# Patient Record
Sex: Male | Born: 1950 | Race: White | Hispanic: No | Marital: Married | Smoking: Former smoker
Health system: Southern US, Community
[De-identification: ages and names within clinical notes are randomized; demographics above are authoritative.]

## PROBLEM LIST (undated history)

## (undated) DIAGNOSIS — I340 Nonrheumatic mitral (valve) insufficiency: Secondary | ICD-10-CM

## (undated) DIAGNOSIS — I714 Abdominal aortic aneurysm, without rupture, unspecified: Secondary | ICD-10-CM

## (undated) DIAGNOSIS — L02215 Cutaneous abscess of perineum: Secondary | ICD-10-CM

## (undated) DIAGNOSIS — I10 Essential (primary) hypertension: Secondary | ICD-10-CM

## (undated) DIAGNOSIS — R972 Elevated prostate specific antigen [PSA]: Secondary | ICD-10-CM

## (undated) DIAGNOSIS — I8390 Asymptomatic varicose veins of unspecified lower extremity: Secondary | ICD-10-CM

## (undated) DIAGNOSIS — I639 Cerebral infarction, unspecified: Secondary | ICD-10-CM

## (undated) DIAGNOSIS — Z860101 Personal history of adenomatous and serrated colon polyps: Secondary | ICD-10-CM

## (undated) DIAGNOSIS — F419 Anxiety disorder, unspecified: Secondary | ICD-10-CM

## (undated) DIAGNOSIS — F411 Generalized anxiety disorder: Secondary | ICD-10-CM

## (undated) DIAGNOSIS — F32A Depression, unspecified: Secondary | ICD-10-CM

## (undated) DIAGNOSIS — Z86010 Personal history of colonic polyps: Secondary | ICD-10-CM

## (undated) DIAGNOSIS — I724 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: Secondary | ICD-10-CM

## (undated) DIAGNOSIS — E119 Type 2 diabetes mellitus without complications: Secondary | ICD-10-CM

## (undated) DIAGNOSIS — Z8679 Personal history of other diseases of the circulatory system: Secondary | ICD-10-CM

## (undated) DIAGNOSIS — K5790 Diverticulosis of intestine, part unspecified, without perforation or abscess without bleeding: Secondary | ICD-10-CM

## (undated) DIAGNOSIS — E785 Hyperlipidemia, unspecified: Secondary | ICD-10-CM

## (undated) DIAGNOSIS — I739 Peripheral vascular disease, unspecified: Secondary | ICD-10-CM

## (undated) DIAGNOSIS — F329 Major depressive disorder, single episode, unspecified: Secondary | ICD-10-CM

## (undated) DIAGNOSIS — R760 Raised antibody titer: Secondary | ICD-10-CM

## (undated) DIAGNOSIS — K648 Other hemorrhoids: Secondary | ICD-10-CM

## (undated) DIAGNOSIS — K219 Gastro-esophageal reflux disease without esophagitis: Secondary | ICD-10-CM

## (undated) HISTORY — DX: Cerebral infarction, unspecified: I63.9

## (undated) HISTORY — DX: Major depressive disorder, single episode, unspecified: F32.9

## (undated) HISTORY — DX: Essential (primary) hypertension: I10

## (undated) HISTORY — PX: OTHER SURGICAL HISTORY: SHX169

## (undated) HISTORY — DX: Anxiety disorder, unspecified: F41.9

## (undated) HISTORY — DX: Other hemorrhoids: K64.8

## (undated) HISTORY — DX: Depression, unspecified: F32.A

## (undated) HISTORY — DX: Type 2 diabetes mellitus without complications: E11.9

## (undated) HISTORY — DX: Peripheral vascular disease, unspecified: I73.9

## (undated) HISTORY — DX: Personal history of colonic polyps: Z86.010

## (undated) HISTORY — DX: Asymptomatic varicose veins of unspecified lower extremity: I83.90

## (undated) HISTORY — DX: Abdominal aortic aneurysm, without rupture: I71.4

## (undated) HISTORY — DX: Personal history of other diseases of the circulatory system: Z86.79

## (undated) HISTORY — DX: Generalized anxiety disorder: F41.1

## (undated) HISTORY — DX: Nonrheumatic mitral (valve) insufficiency: I34.0

## (undated) HISTORY — PX: ABDOMINAL AORTIC ANEURYSM REPAIR: SUR1152

## (undated) HISTORY — DX: Gastro-esophageal reflux disease without esophagitis: K21.9

## (undated) HISTORY — DX: Cutaneous abscess of perineum: L02.215

## (undated) HISTORY — DX: Hyperlipidemia, unspecified: E78.5

## (undated) HISTORY — DX: Diverticulosis of intestine, part unspecified, without perforation or abscess without bleeding: K57.90

## (undated) HISTORY — DX: Elevated prostate specific antigen (PSA): R97.20

## (undated) HISTORY — DX: Abdominal aortic aneurysm, without rupture, unspecified: I71.40

## (undated) HISTORY — DX: Raised antibody titer: R76.0

## (undated) HISTORY — PX: SP ENDO REPAIR INFRAREN AAA: HXRAD399

## (undated) HISTORY — DX: Personal history of adenomatous and serrated colon polyps: Z86.0101

## (undated) HISTORY — PX: POLYPECTOMY: SHX149

## (undated) HISTORY — PX: COLONOSCOPY: SHX174

## (undated) HISTORY — DX: 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: I72.4

---

## 2001-05-06 DIAGNOSIS — I639 Cerebral infarction, unspecified: Secondary | ICD-10-CM

## 2001-05-06 HISTORY — DX: Cerebral infarction, unspecified: I63.9

## 2002-01-08 ENCOUNTER — Inpatient Hospital Stay (HOSPITAL_COMMUNITY): Admission: EM | Admit: 2002-01-08 | Discharge: 2002-01-12 | Payer: Self-pay | Admitting: *Deleted

## 2002-01-08 ENCOUNTER — Encounter: Payer: Self-pay | Admitting: *Deleted

## 2002-01-09 ENCOUNTER — Encounter: Payer: Self-pay | Admitting: Internal Medicine

## 2002-01-11 ENCOUNTER — Encounter (INDEPENDENT_AMBULATORY_CARE_PROVIDER_SITE_OTHER): Payer: Self-pay | Admitting: Cardiology

## 2002-01-15 ENCOUNTER — Encounter: Admission: RE | Admit: 2002-01-15 | Discharge: 2002-02-24 | Payer: Self-pay | Admitting: Internal Medicine

## 2002-03-03 ENCOUNTER — Ambulatory Visit (HOSPITAL_COMMUNITY): Admission: RE | Admit: 2002-03-03 | Discharge: 2002-03-03 | Payer: Self-pay | Admitting: Pediatrics

## 2002-03-03 ENCOUNTER — Encounter: Payer: Self-pay | Admitting: Pediatrics

## 2004-03-16 ENCOUNTER — Ambulatory Visit: Payer: Self-pay | Admitting: Cardiology

## 2004-04-13 ENCOUNTER — Ambulatory Visit: Payer: Self-pay | Admitting: Internal Medicine

## 2004-05-11 ENCOUNTER — Ambulatory Visit: Payer: Self-pay | Admitting: Cardiology

## 2004-06-08 ENCOUNTER — Ambulatory Visit: Payer: Self-pay | Admitting: Cardiology

## 2004-07-06 ENCOUNTER — Ambulatory Visit: Payer: Self-pay | Admitting: Cardiology

## 2004-08-03 ENCOUNTER — Ambulatory Visit: Payer: Self-pay | Admitting: Cardiology

## 2004-08-31 ENCOUNTER — Ambulatory Visit: Payer: Self-pay | Admitting: Cardiology

## 2004-09-28 ENCOUNTER — Ambulatory Visit: Payer: Self-pay | Admitting: Cardiology

## 2004-10-23 ENCOUNTER — Ambulatory Visit: Payer: Self-pay | Admitting: Internal Medicine

## 2004-10-26 ENCOUNTER — Ambulatory Visit: Payer: Self-pay | Admitting: Internal Medicine

## 2004-10-26 ENCOUNTER — Ambulatory Visit: Payer: Self-pay | Admitting: Cardiology

## 2004-11-23 ENCOUNTER — Ambulatory Visit: Payer: Self-pay | Admitting: Cardiovascular Disease

## 2004-12-21 ENCOUNTER — Ambulatory Visit: Payer: Self-pay | Admitting: Internal Medicine

## 2005-01-25 ENCOUNTER — Ambulatory Visit: Payer: Self-pay | Admitting: Cardiology

## 2005-02-22 ENCOUNTER — Ambulatory Visit: Payer: Self-pay | Admitting: Cardiology

## 2005-03-04 ENCOUNTER — Ambulatory Visit: Payer: Self-pay | Admitting: Internal Medicine

## 2005-03-22 ENCOUNTER — Ambulatory Visit: Payer: Self-pay | Admitting: Cardiology

## 2005-03-26 ENCOUNTER — Ambulatory Visit: Payer: Self-pay | Admitting: Gastroenterology

## 2005-04-02 ENCOUNTER — Ambulatory Visit: Payer: Self-pay | Admitting: Gastroenterology

## 2005-04-05 DIAGNOSIS — D126 Benign neoplasm of colon, unspecified: Secondary | ICD-10-CM

## 2005-04-05 HISTORY — DX: Benign neoplasm of colon, unspecified: D12.6

## 2005-04-12 ENCOUNTER — Ambulatory Visit: Payer: Self-pay | Admitting: Cardiology

## 2005-04-22 ENCOUNTER — Ambulatory Visit: Payer: Self-pay | Admitting: Internal Medicine

## 2005-04-24 ENCOUNTER — Encounter (INDEPENDENT_AMBULATORY_CARE_PROVIDER_SITE_OTHER): Payer: Self-pay | Admitting: Specialist

## 2005-04-24 ENCOUNTER — Ambulatory Visit: Payer: Self-pay | Admitting: Gastroenterology

## 2005-04-24 LAB — HM COLONOSCOPY

## 2005-04-30 ENCOUNTER — Ambulatory Visit: Payer: Self-pay | Admitting: Internal Medicine

## 2005-05-10 ENCOUNTER — Ambulatory Visit: Payer: Self-pay | Admitting: Cardiology

## 2005-05-24 ENCOUNTER — Ambulatory Visit: Payer: Self-pay | Admitting: Cardiology

## 2005-06-21 ENCOUNTER — Ambulatory Visit: Payer: Self-pay | Admitting: Internal Medicine

## 2005-07-12 ENCOUNTER — Ambulatory Visit: Payer: Self-pay | Admitting: Cardiology

## 2005-07-16 ENCOUNTER — Ambulatory Visit: Payer: Self-pay | Admitting: Gastroenterology

## 2005-08-01 ENCOUNTER — Ambulatory Visit: Payer: Self-pay | Admitting: Internal Medicine

## 2005-08-30 ENCOUNTER — Ambulatory Visit: Payer: Self-pay

## 2005-09-27 ENCOUNTER — Ambulatory Visit: Payer: Self-pay | Admitting: Cardiology

## 2005-10-30 ENCOUNTER — Ambulatory Visit: Payer: Self-pay | Admitting: Internal Medicine

## 2005-11-27 ENCOUNTER — Ambulatory Visit: Payer: Self-pay | Admitting: Cardiology

## 2005-12-24 ENCOUNTER — Ambulatory Visit: Payer: Self-pay | Admitting: Internal Medicine

## 2005-12-25 ENCOUNTER — Ambulatory Visit: Payer: Self-pay | Admitting: Cardiology

## 2005-12-26 ENCOUNTER — Ambulatory Visit: Payer: Self-pay | Admitting: Internal Medicine

## 2006-01-22 ENCOUNTER — Ambulatory Visit: Payer: Self-pay | Admitting: Cardiology

## 2006-02-19 ENCOUNTER — Ambulatory Visit: Payer: Self-pay | Admitting: Cardiology

## 2006-03-19 ENCOUNTER — Ambulatory Visit: Payer: Self-pay | Admitting: Cardiology

## 2006-04-18 ENCOUNTER — Ambulatory Visit: Payer: Self-pay | Admitting: Internal Medicine

## 2006-05-16 ENCOUNTER — Ambulatory Visit: Payer: Self-pay | Admitting: Internal Medicine

## 2006-06-06 ENCOUNTER — Ambulatory Visit: Payer: Self-pay | Admitting: Cardiology

## 2006-07-04 ENCOUNTER — Ambulatory Visit: Payer: Self-pay | Admitting: Cardiology

## 2006-08-01 ENCOUNTER — Ambulatory Visit: Payer: Self-pay | Admitting: Cardiology

## 2006-08-29 ENCOUNTER — Ambulatory Visit: Payer: Self-pay | Admitting: Cardiology

## 2006-10-03 ENCOUNTER — Ambulatory Visit: Payer: Self-pay | Admitting: Internal Medicine

## 2006-11-05 ENCOUNTER — Ambulatory Visit: Payer: Self-pay | Admitting: Cardiology

## 2006-12-05 ENCOUNTER — Ambulatory Visit: Payer: Self-pay | Admitting: Cardiology

## 2007-01-02 ENCOUNTER — Ambulatory Visit: Payer: Self-pay | Admitting: Cardiovascular Disease

## 2007-01-09 ENCOUNTER — Ambulatory Visit: Payer: Self-pay | Admitting: Internal Medicine

## 2007-01-09 LAB — CONVERTED CEMR LAB
ALT: 42 units/L (ref 0–53)
AST: 45 units/L — ABNORMAL HIGH (ref 0–37)
Albumin: 4.2 g/dL (ref 3.5–5.2)
Alkaline Phosphatase: 28 units/L — ABNORMAL LOW (ref 39–117)
BUN: 15 mg/dL (ref 6–23)
Bacteria, UA: NEGATIVE
Basophils Absolute: 0 10*3/uL (ref 0.0–0.1)
Basophils Relative: 1.3 % — ABNORMAL HIGH (ref 0.0–1.0)
Bilirubin Urine: NEGATIVE
Bilirubin, Direct: 0.2 mg/dL (ref 0.0–0.3)
CO2: 24 meq/L (ref 19–32)
Calcium: 9.9 mg/dL (ref 8.4–10.5)
Chloride: 103 meq/L (ref 96–112)
Cholesterol: 170 mg/dL (ref 0–200)
Creatinine, Ser: 0.9 mg/dL (ref 0.4–1.5)
Crystals: NEGATIVE
Direct LDL: 70.9 mg/dL
Eosinophils Absolute: 0.2 10*3/uL (ref 0.0–0.6)
Eosinophils Relative: 5.7 % — ABNORMAL HIGH (ref 0.0–5.0)
GFR calc Af Amer: 111 mL/min
GFR calc non Af Amer: 92 mL/min
Glucose, Bld: 151 mg/dL — ABNORMAL HIGH (ref 70–99)
HCT: 44.3 % (ref 39.0–52.0)
HDL: 32.3 mg/dL — ABNORMAL LOW (ref >39.0)
Hemoglobin, Urine: NEGATIVE
Hemoglobin: 15.8 g/dL (ref 13.0–17.0)
Ketones, ur: NEGATIVE mg/dL
Lymphocytes Relative: 28.3 % (ref 12.0–46.0)
MCHC: 35.7 g/dL (ref 30.0–36.0)
MCV: 94.9 fL (ref 78.0–100.0)
Monocytes Absolute: 0.4 10*3/uL (ref 0.2–0.7)
Monocytes Relative: 11.8 % — ABNORMAL HIGH (ref 3.0–11.0)
Mucus, UA: NEGATIVE
Neutro Abs: 2.1 10*3/uL (ref 1.4–7.7)
Neutrophils Relative %: 52.9 % (ref 43.0–77.0)
Nitrite: NEGATIVE
PSA: 1.82 ng/mL (ref 0.10–4.00)
Platelets: 215 10*3/uL (ref 150–400)
Potassium: 4.2 meq/L (ref 3.5–5.1)
RBC: 4.67 M/uL (ref 4.22–5.81)
RDW: 11.5 % (ref 11.5–14.6)
Sodium: 136 meq/L (ref 135–145)
Specific Gravity, Urine: 1.01 (ref 1.000–1.03)
TSH: 0.95 microintl units/mL (ref 0.35–5.50)
Total Bilirubin: 1.1 mg/dL (ref 0.3–1.2)
Total CHOL/HDL Ratio: 5.3
Total Protein, Urine: NEGATIVE mg/dL
Total Protein: 7.5 g/dL (ref 6.0–8.3)
Triglycerides: 333 mg/dL (ref 0–149)
Urine Glucose: NEGATIVE mg/dL
Urobilinogen, UA: 0.2 (ref 0.0–1.0)
VLDL: 67 mg/dL — ABNORMAL HIGH (ref 0–40)
WBC: 3.8 10*3/uL — ABNORMAL LOW (ref 4.5–10.5)
pH: 7 (ref 5.0–8.0)

## 2007-01-15 ENCOUNTER — Ambulatory Visit: Payer: Self-pay | Admitting: Internal Medicine

## 2007-01-15 LAB — CONVERTED CEMR LAB
Folate: >20 ng/mL
Hgb A1c MFr Bld: 6 % (ref 4.6–6.0)
Homocysteine: 17.7 micromoles/L — ABNORMAL HIGH (ref 5.00–13.90)
Vitamin B-12: 350 pg/mL (ref 211–911)

## 2007-02-06 ENCOUNTER — Ambulatory Visit: Payer: Self-pay | Admitting: Cardiology

## 2007-03-06 ENCOUNTER — Ambulatory Visit: Payer: Self-pay | Admitting: Cardiology

## 2007-04-06 ENCOUNTER — Ambulatory Visit: Payer: Self-pay | Admitting: Cardiology

## 2007-04-24 ENCOUNTER — Ambulatory Visit: Payer: Self-pay | Admitting: Internal Medicine

## 2007-05-29 ENCOUNTER — Ambulatory Visit: Payer: Self-pay | Admitting: Cardiology

## 2007-06-26 ENCOUNTER — Ambulatory Visit: Payer: Self-pay | Admitting: Cardiology

## 2007-07-24 ENCOUNTER — Ambulatory Visit: Payer: Self-pay | Admitting: Cardiology

## 2007-08-21 ENCOUNTER — Ambulatory Visit: Payer: Self-pay | Admitting: Cardiology

## 2007-09-25 ENCOUNTER — Ambulatory Visit: Payer: Self-pay | Admitting: Internal Medicine

## 2007-10-23 ENCOUNTER — Ambulatory Visit: Payer: Self-pay | Admitting: Cardiology

## 2007-11-20 ENCOUNTER — Ambulatory Visit: Payer: Self-pay | Admitting: Cardiovascular Disease

## 2007-11-25 ENCOUNTER — Telehealth: Payer: Self-pay | Admitting: Gastroenterology

## 2007-12-10 ENCOUNTER — Ambulatory Visit: Payer: Self-pay | Admitting: Gastroenterology

## 2007-12-10 DIAGNOSIS — Z860100 Personal history of colon polyps, unspecified: Secondary | ICD-10-CM

## 2007-12-10 DIAGNOSIS — R195 Other fecal abnormalities: Secondary | ICD-10-CM | POA: Insufficient documentation

## 2007-12-10 DIAGNOSIS — K625 Hemorrhage of anus and rectum: Secondary | ICD-10-CM | POA: Insufficient documentation

## 2007-12-10 HISTORY — DX: Personal history of colon polyps, unspecified: Z86.0100

## 2007-12-10 HISTORY — DX: Personal history of colonic polyps: Z86.010

## 2007-12-25 ENCOUNTER — Ambulatory Visit: Payer: Self-pay | Admitting: Internal Medicine

## 2008-01-13 ENCOUNTER — Ambulatory Visit: Payer: Self-pay | Admitting: Internal Medicine

## 2008-01-13 LAB — CONVERTED CEMR LAB
ALT: 40 units/L (ref 0–53)
AST: 40 units/L — ABNORMAL HIGH (ref 0–37)
Albumin: 4 g/dL (ref 3.5–5.2)
Alkaline Phosphatase: 25 units/L — ABNORMAL LOW (ref 39–117)
BUN: 17 mg/dL (ref 6–23)
Basophils Absolute: 0.1 10*3/uL (ref 0.0–0.1)
Basophils Relative: 1.9 % (ref 0.0–3.0)
Bilirubin Urine: NEGATIVE
Bilirubin, Direct: 0.1 mg/dL (ref 0.0–0.3)
CO2: 30 meq/L (ref 19–32)
Calcium: 9.5 mg/dL (ref 8.4–10.5)
Chloride: 109 meq/L (ref 96–112)
Cholesterol: 149 mg/dL (ref 0–200)
Creatinine, Ser: 1 mg/dL (ref 0.4–1.5)
Direct LDL: 64.3 mg/dL
Eosinophils Absolute: 0.3 10*3/uL (ref 0.0–0.7)
Eosinophils Relative: 7 % — ABNORMAL HIGH (ref 0.0–5.0)
GFR calc Af Amer: 98 mL/min
GFR calc non Af Amer: 81 mL/min
Glucose, Bld: 185 mg/dL — ABNORMAL HIGH (ref 70–99)
HCT: 43.2 % (ref 39.0–52.0)
HDL: 33.3 mg/dL — ABNORMAL LOW (ref >39.0)
Hemoglobin, Urine: NEGATIVE
Hemoglobin: 15.5 g/dL (ref 13.0–17.0)
Hgb A1c MFr Bld: 7.1 % — ABNORMAL HIGH (ref 4.6–6.0)
Ketones, ur: NEGATIVE mg/dL
Leukocytes, UA: NEGATIVE
Lymphocytes Relative: 33.2 % (ref 12.0–46.0)
MCHC: 36 g/dL (ref 30.0–36.0)
MCV: 94.7 fL (ref 78.0–100.0)
Monocytes Absolute: 0.4 10*3/uL (ref 0.1–1.0)
Monocytes Relative: 11.6 % (ref 3.0–12.0)
Neutro Abs: 1.6 10*3/uL (ref 1.4–7.7)
Neutrophils Relative %: 46.3 % (ref 43.0–77.0)
Nitrite: NEGATIVE
PSA: 1.45 ng/mL (ref 0.10–4.00)
Platelets: 211 10*3/uL (ref 150–400)
Potassium: 4.9 meq/L (ref 3.5–5.1)
RBC: 4.55 M/uL (ref 4.22–5.81)
RDW: 11.6 % (ref 11.5–14.6)
Sodium: 139 meq/L (ref 135–145)
Specific Gravity, Urine: 1.015 (ref 1.000–1.03)
TSH: 1.34 microintl units/mL (ref 0.35–5.50)
Total Bilirubin: 0.9 mg/dL (ref 0.3–1.2)
Total CHOL/HDL Ratio: 4.5
Total Protein, Urine: NEGATIVE mg/dL
Total Protein: 7.3 g/dL (ref 6.0–8.3)
Triglycerides: 425 mg/dL (ref 0–149)
Urine Glucose: NEGATIVE mg/dL
Urobilinogen, UA: 0.2 (ref 0.0–1.0)
VLDL: 85 mg/dL — ABNORMAL HIGH (ref 0–40)
WBC: 3.6 10*3/uL — ABNORMAL LOW (ref 4.5–10.5)
pH: 7 (ref 5.0–8.0)

## 2008-01-18 ENCOUNTER — Ambulatory Visit: Payer: Self-pay | Admitting: Gastroenterology

## 2008-01-18 ENCOUNTER — Ambulatory Visit: Payer: Self-pay | Admitting: Internal Medicine

## 2008-01-18 DIAGNOSIS — I83893 Varicose veins of bilateral lower extremities with other complications: Secondary | ICD-10-CM | POA: Insufficient documentation

## 2008-01-18 DIAGNOSIS — E119 Type 2 diabetes mellitus without complications: Secondary | ICD-10-CM

## 2008-01-18 DIAGNOSIS — K648 Other hemorrhoids: Secondary | ICD-10-CM | POA: Insufficient documentation

## 2008-01-18 DIAGNOSIS — K644 Residual hemorrhoidal skin tags: Secondary | ICD-10-CM | POA: Insufficient documentation

## 2008-01-18 DIAGNOSIS — I739 Peripheral vascular disease, unspecified: Secondary | ICD-10-CM

## 2008-01-18 DIAGNOSIS — N1832 Chronic kidney disease, stage 3b: Secondary | ICD-10-CM | POA: Insufficient documentation

## 2008-01-18 HISTORY — DX: Peripheral vascular disease, unspecified: I73.9

## 2008-01-18 HISTORY — DX: Type 2 diabetes mellitus without complications: E11.9

## 2008-01-22 ENCOUNTER — Ambulatory Visit: Payer: Self-pay | Admitting: Internal Medicine

## 2008-01-26 ENCOUNTER — Ambulatory Visit: Payer: Self-pay

## 2008-01-26 ENCOUNTER — Encounter: Payer: Self-pay | Admitting: Internal Medicine

## 2008-02-19 ENCOUNTER — Ambulatory Visit: Payer: Self-pay | Admitting: Cardiology

## 2008-03-18 ENCOUNTER — Ambulatory Visit: Payer: Self-pay | Admitting: Cardiovascular Disease

## 2008-04-15 ENCOUNTER — Ambulatory Visit: Payer: Self-pay | Admitting: Cardiology

## 2008-05-02 ENCOUNTER — Telehealth: Payer: Self-pay | Admitting: Internal Medicine

## 2008-05-13 ENCOUNTER — Ambulatory Visit: Payer: Self-pay | Admitting: Internal Medicine

## 2008-06-17 ENCOUNTER — Ambulatory Visit: Payer: Self-pay | Admitting: Cardiology

## 2008-06-27 ENCOUNTER — Encounter: Payer: Self-pay | Admitting: Internal Medicine

## 2008-07-14 ENCOUNTER — Ambulatory Visit: Payer: Self-pay | Admitting: Internal Medicine

## 2008-07-14 LAB — CONVERTED CEMR LAB
BUN: 14 mg/dL (ref 6–23)
CO2: 30 meq/L (ref 19–32)
Calcium: 9.7 mg/dL (ref 8.4–10.5)
Chloride: 103 meq/L (ref 96–112)
Cholesterol: 149 mg/dL (ref 0–200)
Creatinine, Ser: 0.9 mg/dL (ref 0.4–1.5)
Direct LDL: 49.7 mg/dL
GFR calc Af Amer: 111 mL/min
GFR calc non Af Amer: 92 mL/min
Glucose, Bld: 214 mg/dL — ABNORMAL HIGH (ref 70–99)
HDL: 31.6 mg/dL — ABNORMAL LOW (ref >39.0)
Hgb A1c MFr Bld: 7.4 % — ABNORMAL HIGH (ref 4.6–6.0)
Potassium: 4.4 meq/L (ref 3.5–5.1)
Sodium: 140 meq/L (ref 135–145)
Total CHOL/HDL Ratio: 4.7
Triglycerides: 417 mg/dL (ref 0–149)
VLDL: 83 mg/dL — ABNORMAL HIGH (ref 0–40)

## 2008-07-15 ENCOUNTER — Ambulatory Visit: Payer: Self-pay | Admitting: Cardiovascular Disease

## 2008-07-19 ENCOUNTER — Ambulatory Visit: Payer: Self-pay | Admitting: Internal Medicine

## 2008-07-19 DIAGNOSIS — K219 Gastro-esophageal reflux disease without esophagitis: Secondary | ICD-10-CM

## 2008-07-19 DIAGNOSIS — E785 Hyperlipidemia, unspecified: Secondary | ICD-10-CM

## 2008-07-19 DIAGNOSIS — F32A Depression, unspecified: Secondary | ICD-10-CM | POA: Insufficient documentation

## 2008-07-19 DIAGNOSIS — F3289 Other specified depressive episodes: Secondary | ICD-10-CM

## 2008-07-19 DIAGNOSIS — F411 Generalized anxiety disorder: Secondary | ICD-10-CM

## 2008-07-19 DIAGNOSIS — Z8673 Personal history of transient ischemic attack (TIA), and cerebral infarction without residual deficits: Secondary | ICD-10-CM

## 2008-07-19 DIAGNOSIS — Z8679 Personal history of other diseases of the circulatory system: Secondary | ICD-10-CM

## 2008-07-19 DIAGNOSIS — F329 Major depressive disorder, single episode, unspecified: Secondary | ICD-10-CM

## 2008-07-19 HISTORY — DX: Other specified depressive episodes: F32.89

## 2008-07-19 HISTORY — DX: Generalized anxiety disorder: F41.1

## 2008-07-19 HISTORY — DX: Personal history of other diseases of the circulatory system: Z86.79

## 2008-07-19 HISTORY — DX: Major depressive disorder, single episode, unspecified: F32.9

## 2008-07-19 HISTORY — DX: Hyperlipidemia, unspecified: E78.5

## 2008-07-19 HISTORY — DX: Gastro-esophageal reflux disease without esophagitis: K21.9

## 2008-08-08 ENCOUNTER — Encounter: Payer: Self-pay | Admitting: Internal Medicine

## 2008-08-12 ENCOUNTER — Ambulatory Visit: Payer: Self-pay | Admitting: Internal Medicine

## 2008-08-26 ENCOUNTER — Encounter: Payer: Self-pay | Admitting: Internal Medicine

## 2008-09-07 ENCOUNTER — Encounter: Payer: Self-pay | Admitting: Internal Medicine

## 2008-09-07 ENCOUNTER — Telehealth: Payer: Self-pay | Admitting: Internal Medicine

## 2008-09-07 DIAGNOSIS — I724 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: Secondary | ICD-10-CM

## 2008-09-07 HISTORY — DX: 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: I72.4

## 2008-09-09 ENCOUNTER — Ambulatory Visit: Payer: Self-pay | Admitting: Cardiovascular Disease

## 2008-09-14 ENCOUNTER — Encounter: Payer: Self-pay | Admitting: Internal Medicine

## 2008-09-15 ENCOUNTER — Telehealth: Payer: Self-pay | Admitting: Gastroenterology

## 2008-10-05 ENCOUNTER — Encounter: Payer: Self-pay | Admitting: *Deleted

## 2008-10-06 ENCOUNTER — Encounter: Payer: Self-pay | Admitting: Internal Medicine

## 2008-10-06 ENCOUNTER — Ambulatory Visit: Payer: Self-pay | Admitting: *Deleted

## 2008-10-07 ENCOUNTER — Ambulatory Visit: Payer: Self-pay | Admitting: Internal Medicine

## 2008-10-07 LAB — CONVERTED CEMR LAB
POC INR: 2.1
Protime: 17.8

## 2008-10-13 ENCOUNTER — Encounter: Admission: RE | Admit: 2008-10-13 | Discharge: 2008-10-13 | Payer: Self-pay | Admitting: *Deleted

## 2008-10-13 ENCOUNTER — Ambulatory Visit: Payer: Self-pay | Admitting: *Deleted

## 2008-10-27 ENCOUNTER — Telehealth (INDEPENDENT_AMBULATORY_CARE_PROVIDER_SITE_OTHER): Payer: Self-pay

## 2008-10-31 ENCOUNTER — Ambulatory Visit: Payer: Self-pay

## 2008-10-31 ENCOUNTER — Encounter: Payer: Self-pay | Admitting: Cardiovascular Disease

## 2008-11-03 ENCOUNTER — Ambulatory Visit: Payer: Self-pay | Admitting: *Deleted

## 2008-11-04 ENCOUNTER — Ambulatory Visit: Payer: Self-pay | Admitting: Cardiology

## 2008-11-04 LAB — CONVERTED CEMR LAB
POC INR: 2.3
Prothrombin Time: 18.7 s

## 2008-11-09 ENCOUNTER — Encounter: Payer: Self-pay | Admitting: *Deleted

## 2008-11-14 ENCOUNTER — Ambulatory Visit: Payer: Self-pay | Admitting: *Deleted

## 2008-11-14 ENCOUNTER — Ambulatory Visit (HOSPITAL_COMMUNITY): Admission: RE | Admit: 2008-11-14 | Discharge: 2008-11-14 | Payer: Self-pay | Admitting: *Deleted

## 2008-12-06 ENCOUNTER — Observation Stay (HOSPITAL_COMMUNITY): Admission: RE | Admit: 2008-12-06 | Discharge: 2008-12-07 | Payer: Self-pay | Admitting: *Deleted

## 2008-12-13 ENCOUNTER — Inpatient Hospital Stay (HOSPITAL_COMMUNITY): Admission: RE | Admit: 2008-12-13 | Discharge: 2008-12-18 | Payer: Self-pay | Admitting: *Deleted

## 2008-12-13 ENCOUNTER — Encounter (INDEPENDENT_AMBULATORY_CARE_PROVIDER_SITE_OTHER): Payer: Self-pay | Admitting: *Deleted

## 2008-12-13 ENCOUNTER — Ambulatory Visit: Payer: Self-pay | Admitting: *Deleted

## 2008-12-16 ENCOUNTER — Encounter: Payer: Self-pay | Admitting: Cardiology

## 2008-12-19 ENCOUNTER — Encounter (INDEPENDENT_AMBULATORY_CARE_PROVIDER_SITE_OTHER): Payer: Self-pay | Admitting: *Deleted

## 2008-12-26 ENCOUNTER — Ambulatory Visit: Payer: Self-pay | Admitting: Surgery

## 2009-01-13 ENCOUNTER — Ambulatory Visit: Payer: Self-pay | Admitting: Internal Medicine

## 2009-01-13 LAB — CONVERTED CEMR LAB
ALT: 20 units/L (ref 0–53)
AST: 22 units/L (ref 0–37)
Albumin: 3.9 g/dL (ref 3.5–5.2)
Alkaline Phosphatase: 44 units/L (ref 39–117)
BUN: 29 mg/dL — ABNORMAL HIGH (ref 6–23)
Basophils Absolute: 0 10*3/uL (ref 0.0–0.1)
Basophils Relative: 0.1 % (ref 0.0–3.0)
Bilirubin Urine: NEGATIVE
Bilirubin, Direct: 0.1 mg/dL (ref 0.0–0.3)
CO2: 25 meq/L (ref 19–32)
Calcium: 9.9 mg/dL (ref 8.4–10.5)
Chloride: 102 meq/L (ref 96–112)
Cholesterol: 116 mg/dL (ref 0–200)
Creatinine, Ser: 1.3 mg/dL (ref 0.4–1.5)
Creatinine,U: 301.8 mg/dL
Eosinophils Absolute: 0.6 10*3/uL (ref 0.0–0.7)
Eosinophils Relative: 10.4 % — ABNORMAL HIGH (ref 0.0–5.0)
GFR calc non Af Amer: 59.79 mL/min (ref >60)
Glucose, Bld: 131 mg/dL — ABNORMAL HIGH (ref 70–99)
HCT: 36.9 % — ABNORMAL LOW (ref 39.0–52.0)
HDL: 28.8 mg/dL — ABNORMAL LOW (ref >39.00)
Hemoglobin, Urine: NEGATIVE
Hemoglobin: 12.7 g/dL — ABNORMAL LOW (ref 13.0–17.0)
Hgb A1c MFr Bld: 5.6 % (ref 4.6–6.5)
LDL Cholesterol: 48 mg/dL (ref 0–99)
Leukocytes, UA: NEGATIVE
Lymphocytes Relative: 17.2 % (ref 12.0–46.0)
Lymphs Abs: 1 10*3/uL (ref 0.7–4.0)
MCHC: 34.5 g/dL (ref 30.0–36.0)
MCV: 92.7 fL (ref 78.0–100.0)
Microalb Creat Ratio: 28.2 mg/g (ref 0.0–30.0)
Microalb, Ur: 8.5 mg/dL — ABNORMAL HIGH (ref 0.0–1.9)
Monocytes Absolute: 0.5 10*3/uL (ref 0.1–1.0)
Monocytes Relative: 9.8 % (ref 3.0–12.0)
Neutro Abs: 3.5 10*3/uL (ref 1.4–7.7)
Neutrophils Relative %: 62.5 % (ref 43.0–77.0)
Nitrite: NEGATIVE
PSA: 1.56 ng/mL (ref 0.10–4.00)
Platelets: 247 10*3/uL (ref 150.0–400.0)
Potassium: 4.3 meq/L (ref 3.5–5.1)
RBC: 3.98 M/uL — ABNORMAL LOW (ref 4.22–5.81)
RDW: 12.4 % (ref 11.5–14.6)
Sodium: 136 meq/L (ref 135–145)
Specific Gravity, Urine: 1.02 (ref 1.000–1.030)
TSH: 1.3 microintl units/mL (ref 0.35–5.50)
Total Bilirubin: 0.8 mg/dL (ref 0.3–1.2)
Total CHOL/HDL Ratio: 4
Total Protein, Urine: 30 mg/dL
Total Protein: 8.1 g/dL (ref 6.0–8.3)
Triglycerides: 198 mg/dL — ABNORMAL HIGH (ref 0.0–149.0)
Urine Glucose: NEGATIVE mg/dL
Urobilinogen, UA: 0.2 (ref 0.0–1.0)
VLDL: 39.6 mg/dL (ref 0.0–40.0)
WBC: 5.6 10*3/uL (ref 4.5–10.5)
pH: 6 (ref 5.0–8.0)

## 2009-01-18 ENCOUNTER — Ambulatory Visit: Payer: Self-pay | Admitting: Internal Medicine

## 2009-01-30 ENCOUNTER — Ambulatory Visit: Payer: Self-pay | Admitting: Surgery

## 2009-01-30 ENCOUNTER — Encounter: Payer: Self-pay | Admitting: Internal Medicine

## 2009-02-14 ENCOUNTER — Inpatient Hospital Stay (HOSPITAL_COMMUNITY): Admission: RE | Admit: 2009-02-14 | Discharge: 2009-02-17 | Payer: Self-pay | Admitting: Surgery

## 2009-02-14 ENCOUNTER — Ambulatory Visit: Payer: Self-pay | Admitting: Surgery

## 2009-02-16 ENCOUNTER — Encounter: Payer: Self-pay | Admitting: Surgery

## 2009-02-23 ENCOUNTER — Ambulatory Visit: Payer: Self-pay | Admitting: Vascular Surgery

## 2009-02-27 ENCOUNTER — Ambulatory Visit: Payer: Self-pay | Admitting: Surgery

## 2009-03-09 ENCOUNTER — Telehealth: Payer: Self-pay | Admitting: Internal Medicine

## 2009-03-13 ENCOUNTER — Ambulatory Visit: Payer: Self-pay | Admitting: Surgery

## 2009-03-27 ENCOUNTER — Ambulatory Visit: Payer: Self-pay | Admitting: Surgery

## 2009-04-24 ENCOUNTER — Ambulatory Visit: Payer: Self-pay | Admitting: Surgery

## 2009-05-26 ENCOUNTER — Encounter: Payer: Self-pay | Admitting: Internal Medicine

## 2009-06-05 ENCOUNTER — Ambulatory Visit: Payer: Self-pay | Admitting: Surgery

## 2009-07-10 ENCOUNTER — Ambulatory Visit: Payer: Self-pay | Admitting: Vascular Surgery

## 2009-07-18 ENCOUNTER — Ambulatory Visit: Payer: Self-pay | Admitting: Vascular Surgery

## 2009-07-27 ENCOUNTER — Ambulatory Visit: Payer: Self-pay | Admitting: Internal Medicine

## 2009-07-27 LAB — CONVERTED CEMR LAB
BUN: 13 mg/dL (ref 6–23)
CO2: 30 meq/L (ref 19–32)
Calcium: 10.8 mg/dL — ABNORMAL HIGH (ref 8.4–10.5)
Chloride: 102 meq/L (ref 96–112)
Cholesterol: 159 mg/dL (ref 0–200)
Creatinine, Ser: 0.9 mg/dL (ref 0.4–1.5)
Direct LDL: 65.5 mg/dL
GFR calc non Af Amer: 91.23 mL/min (ref >60)
Glucose, Bld: 115 mg/dL — ABNORMAL HIGH (ref 70–99)
HDL: 40.9 mg/dL (ref >39.00)
Hgb A1c MFr Bld: 5.3 % (ref 4.6–6.5)
Potassium: 4.5 meq/L (ref 3.5–5.1)
Sodium: 141 meq/L (ref 135–145)
Total CHOL/HDL Ratio: 4
Triglycerides: 268 mg/dL — ABNORMAL HIGH (ref 0.0–149.0)
VLDL: 53.6 mg/dL — ABNORMAL HIGH (ref 0.0–40.0)

## 2009-07-31 ENCOUNTER — Ambulatory Visit: Payer: Self-pay | Admitting: Internal Medicine

## 2009-07-31 DIAGNOSIS — I10 Essential (primary) hypertension: Secondary | ICD-10-CM | POA: Insufficient documentation

## 2009-07-31 HISTORY — DX: Essential (primary) hypertension: I10

## 2009-08-14 ENCOUNTER — Ambulatory Visit: Payer: Self-pay | Admitting: Vascular Surgery

## 2009-09-05 ENCOUNTER — Ambulatory Visit: Payer: Self-pay | Admitting: Vascular Surgery

## 2009-12-04 ENCOUNTER — Ambulatory Visit: Payer: Self-pay | Admitting: Surgery

## 2010-02-05 ENCOUNTER — Ambulatory Visit: Payer: Self-pay | Admitting: Surgery

## 2010-03-02 ENCOUNTER — Encounter: Payer: Self-pay | Admitting: Internal Medicine

## 2010-03-16 ENCOUNTER — Ambulatory Visit: Payer: Self-pay | Admitting: Internal Medicine

## 2010-03-16 LAB — CONVERTED CEMR LAB
ALT: 44 units/L (ref 0–53)
AST: 54 units/L — ABNORMAL HIGH (ref 0–37)
Albumin: 4.4 g/dL (ref 3.5–5.2)
Alkaline Phosphatase: 26 units/L — ABNORMAL LOW (ref 39–117)
BUN: 21 mg/dL (ref 6–23)
Basophils Absolute: 0.1 10*3/uL (ref 0.0–0.1)
Basophils Relative: 1.6 % (ref 0.0–3.0)
Bilirubin Urine: NEGATIVE
Bilirubin, Direct: 0.1 mg/dL (ref 0.0–0.3)
CO2: 28 meq/L (ref 19–32)
Calcium: 9.7 mg/dL (ref 8.4–10.5)
Chloride: 102 meq/L (ref 96–112)
Cholesterol: 131 mg/dL (ref 0–200)
Creatinine, Ser: 1.1 mg/dL (ref 0.4–1.5)
Creatinine,U: 175.7 mg/dL
Direct LDL: 54.8 mg/dL
Eosinophils Absolute: 0.4 10*3/uL (ref 0.0–0.7)
Eosinophils Relative: 8.6 % — ABNORMAL HIGH (ref 0.0–5.0)
GFR calc non Af Amer: 71.47 mL/min (ref >60)
Glucose, Bld: 184 mg/dL — ABNORMAL HIGH (ref 70–99)
HCT: 42.1 % (ref 39.0–52.0)
HDL: 32.8 mg/dL — ABNORMAL LOW (ref >39.00)
Hemoglobin, Urine: NEGATIVE
Hemoglobin: 15 g/dL (ref 13.0–17.0)
Hgb A1c MFr Bld: 6.3 % (ref 4.6–6.5)
Ketones, ur: NEGATIVE mg/dL
Leukocytes, UA: NEGATIVE
Lymphocytes Relative: 26.7 % (ref 12.0–46.0)
Lymphs Abs: 1.1 10*3/uL (ref 0.7–4.0)
MCHC: 35.6 g/dL (ref 30.0–36.0)
MCV: 97.8 fL (ref 78.0–100.0)
Microalb Creat Ratio: 2.2 mg/g (ref 0.0–30.0)
Microalb, Ur: 3.9 mg/dL — ABNORMAL HIGH (ref 0.0–1.9)
Monocytes Absolute: 0.5 10*3/uL (ref 0.1–1.0)
Monocytes Relative: 10.7 % (ref 3.0–12.0)
Neutro Abs: 2.3 10*3/uL (ref 1.4–7.7)
Neutrophils Relative %: 52.4 % (ref 43.0–77.0)
Nitrite: NEGATIVE
Platelets: 194 10*3/uL (ref 150.0–400.0)
Potassium: 4.6 meq/L (ref 3.5–5.1)
RBC: 4.3 M/uL (ref 4.22–5.81)
RDW: 12.1 % (ref 11.5–14.6)
Sodium: 138 meq/L (ref 135–145)
Specific Gravity, Urine: 1.015 (ref 1.000–1.030)
TSH: 1.43 microintl units/mL (ref 0.35–5.50)
Total Bilirubin: 0.6 mg/dL (ref 0.3–1.2)
Total CHOL/HDL Ratio: 4
Total Protein, Urine: NEGATIVE mg/dL
Total Protein: 7.3 g/dL (ref 6.0–8.3)
Triglycerides: 273 mg/dL — ABNORMAL HIGH (ref 0.0–149.0)
Urine Glucose: NEGATIVE mg/dL
Urobilinogen, UA: 0.2 (ref 0.0–1.0)
VLDL: 54.6 mg/dL — ABNORMAL HIGH (ref 0.0–40.0)
WBC: 4.3 10*3/uL — ABNORMAL LOW (ref 4.5–10.5)
pH: 6 (ref 5.0–8.0)

## 2010-03-21 ENCOUNTER — Ambulatory Visit: Payer: Self-pay | Admitting: Internal Medicine

## 2010-03-21 LAB — CONVERTED CEMR LAB: PSA: 2 ng/mL (ref 0.10–4.00)

## 2010-04-17 ENCOUNTER — Encounter: Payer: Self-pay | Admitting: Gastroenterology

## 2010-04-18 ENCOUNTER — Encounter: Payer: Self-pay | Admitting: Gastroenterology

## 2010-05-23 LAB — SURGICAL PCR SCREEN
MRSA, PCR: NEGATIVE
Staphylococcus aureus: POSITIVE — AB

## 2010-05-23 LAB — BASIC METABOLIC PANEL
BUN: 23 mg/dL (ref 6–23)
CO2: 26 mEq/L (ref 19–32)
Calcium: 10.2 mg/dL (ref 8.4–10.5)
Chloride: 100 mEq/L (ref 96–112)
Creatinine, Ser: 1 mg/dL (ref 0.4–1.5)
GFR calc Af Amer: >60 mL/min (ref >60)
GFR calc non Af Amer: >60 mL/min (ref >60)
Glucose, Bld: 154 mg/dL — ABNORMAL HIGH (ref 70–99)
Potassium: 4.1 mEq/L (ref 3.5–5.1)
Sodium: 136 mEq/L (ref 135–145)

## 2010-05-31 ENCOUNTER — Ambulatory Visit (HOSPITAL_COMMUNITY)
Admission: RE | Admit: 2010-05-31 | Discharge: 2010-06-01 | Payer: Medicare Other | Source: Home / Self Care | Attending: Surgery | Admitting: Surgery

## 2010-05-31 HISTORY — PX: OTHER SURGICAL HISTORY: SHX169

## 2010-05-31 LAB — POCT I-STAT 4, (NA,K, GLUC, HGB,HCT)
Glucose, Bld: 183 mg/dL — ABNORMAL HIGH (ref 70–99)
HCT: 40 % (ref 39.0–52.0)
Hemoglobin: 13.6 g/dL (ref 13.0–17.0)
Potassium: 4.5 mEq/L (ref 3.5–5.1)
Sodium: 139 mEq/L (ref 135–145)

## 2010-06-04 NOTE — Op Note (Signed)
David [name], David [name]               ACCOUNT NO.:  [id number]  MEDICAL RECORD NO.:  [id number]          PATIENT TYPE:  INP  LOCATION:  1540                         FACILITY:  [facility]  PHYSICIAN:  Ardeth Sportsman, MD     DATE OF BIRTH:  02/15/1951  DATE OF PROCEDURE: DATE OF DISCHARGE:                              OPERATIVE REPORT   PRIMARY CARE PHYSICIAN:  Corwin Levins, M.D.  VASCULAR SURGEON: 1. Charlena Cross, MD  GASTROENTEROLOGIST:  Venita Lick. Russella Dar, MD, Clementeen Graham  SURGEON:  Ardeth Sportsman, M.D.  ASSISTANT:  RN.  PREPROCEDURE DIAGNOSES: 1. Ventral incisional hernias, supra- and infraumbilical. 2. Left inguinal hernia.  POSTOPERATIVE DIAGNOSES: 1. Ventral incisional hernias, 25 x 10 cm maximum region, Swiss-cheese     hernias. 2. Bilateral spigelian hernias. 3. Left indirect inguinal hernia.  PROCEDURE PERFORMED: 1. Diagnostic laparoscopy. 2. Laparoscopic lysis of adhesions for 30 minutes (one-quarter of the     case). 3. Laparoscopic left inguinal hernia repair with mesh (TAPP approach). 4. Laparoscopic bilateral spigelian hernia repair. 5. Laparoscopic ventral incisional hernia repair with meshes.  ANESTHESIA: 1. General anesthesia. 2. Local anesthetic in a field block. 3. On-Q continuous bupivacaine pain pump (300 mL x3 days).  SPECIMENS:  None.  DRAINS:  None.  ESTIMATED BLOOD LOSS:  50 mL.  COMPLICATIONS:  None.  MAJOR INDICATIONS:  Mr. Navarette is a 60 year old obese male with distant smoking who had an abdominal aortic aneurysm repaired by Dr. Liliane Bade a year and a half ago.  He developed incisional hernia.  He was sent to me for evaluation, given the fact that he started having bulging and pain and discomfort.  Dr. Myra Gianotti sent him to me for evaluation.  I felt he also had a left inguinal hernia as well.  The anatomy and physiology of abdominal function was discussed.  Pathophysiology of herniation with its risks, incarcerations, strangulations,  etc., was discussed.  Options discussed, recommendation made for diagnostic laparoscopy with repair of his hernias.  Risks, benefits, and alternatives were discussed.  Risk of recurrence and alternative methods were discussed.  Options going at other institutions were discussed.  He wished to proceed.  OPERATIVE FINDINGS:  He had a subxiphoid and supraumbilical ventral incisional hernias.  He also had an infraumbilical large ventral incisional hernia.  He also had bilateral spigelian hernias in bilateral lower quadrants.  He did not have any evidence of an inguinal hernia, femoral hernia, or obturator hernia on the right.  He had a definite left indirect inguinal hernia.  There was no evidence of any direct defect or femoral or obturator defects on the left side.  DESCRIPTION OF PROCEDURE:  Informed consent was confirmed.  The patient received IV antibiotics prior to incision.  He underwent general anesthesia without any difficulty.  He was positioned supine with his arms tucked.  He had a Foley catheter sterilely placed.  His abdomen and mons pubis were clipped, prepped, and draped in sterile fashion. Surgical time-out confirmed our plan.  I placed a #5 mm port in the left upper quadrant using optical entry technique with the patient in steep reverse Trendelenburg and  the left side up.  Camera entry was clean.  Camera inspection revealed adhesions to the anterior midline abdominal wall.  I used sharp dissection as well as some focussed cautery to free this off.  It was primarily omentum.  With this, I could see the large infraumbilical ventral hernia that was 10 x 10 cm in size.  However, falling from the umbilicus to the xiphoid, he had numerous hernias as well.  There was a 5 x 8, a 4 x 6, and then several 2 x 2 cm hernias going all the way up to the subxiphoid region.  I also did camera inspection and saw bilateral spigelian hernias.  He had an obvious left inguinal hernia as  well, though it was covered up by a sigmoid.  I could not see a defect on the right side.  I decided to go inferiorly first.  I scored the peritoneum in bilateral lower quadrants and freed the peritoneum off to get into the preperitoneal plane for a classic-type approach, TAPP approach.  I was just superior to the spigelian hernias in doing that.  Spigelian hernias had fat within them and I removed those down.  I focussed on the left side.  I could free peritoneum off the spermatic cord.  He had moderately large cord lipoma on the left side, which was a lead point for his indirect inguinal hernia.  I reduced the cord lipoma and skeletonized it off the spermatic cord.  I peeled that off the psoas as proximally as possible.  I freed the bladder down and the anterolateral bladder off the pelvis on the left.  Next, I turned to his right side and continued over on the right.  In doing dissection, I could not prove he had a definite hernia, however, because these were somewhat low spigelians, I felt like a TAPP approach inferiorly would be helpful to place the mesh.  Therefore, I freed that as well.  I used 15 x 15 cm ultra-lightweight polypropylene (Ultrapro mesh) redesigned.  I placed it setting median from corner, rested in the true pelvis between the lateral bladder and side walls.  They overlapped across the midline.  Given his morbid obesity and the indirect being rather large, I did place tags surrounded on the left side especially, a little bit on the right.  The superolateral ridge actually covered up the spigelian hernia as well.  I took the peritoneal flap including the left hernia sac and cord lipomas and tacked those in the anterior abdominal wall.  I turned attention to ventral hernia repair.  I chose a 20 x 30 cm dual- sided mesh (Parietex/Seprafilm).  I rolled it in the abdomen and tacked it to the anterior abdominal wall using #1 Prolene interrupted stitches x20 around  the periphery.  I used 4 centrally and each just lateral to the hernias inferiorly and superiorly, so that they were tacked in 4 corners and centrally.  I used tacker to tack around the mesh as well.  The inferior edge of the mesh overlapped with the bilateral groin meshes, bilateral preperitoneal meshes as well.  Stitches were used to help them secure, these were tied together.  I did inspection.  The most superior ventral hernia was not covered by the mesh.  Therefore, I used a 10 x 15 cm mesh, also Parietex/Seprafilm. I rolled it in the abdomen and secured it in the subxiphoid region longitudinally.  I secured it to the anterior abdominal wall using #1 Prolene interrupted stitches x8.  I used a tag as well.  This provided excellent overlap.  There was some old blood from the greater omentum from the lysis of adhesions, but that was all clear and quiet.  Camera inspection revealed no injury to the rest of the bowel.  I evacuated the carbon dioxide from the peritoneum and removed the ports.  Please note, I had use numerous tackers to help tack the mesh around peripherally and centrally around all the meshes.  The 10 mm port site in the left upper quadrant, I closed with 0-Vicryl stitch.  The another 5 mm ports were too small.  I closed the skin around the port sides with Monocryl and Steri-Strips in the puncture sites.  I placed an On-Q with a catheter in preperitoneal plane using laparoscopic vision before removing all the ports.  The patient was extubated and taken to the recovery room in stable condition.  Given the more extensive dissection than expected, I anticipate him at least staying overnight and possibly longer.     Ardeth Sportsman, MD     SCG/MEDQ  D:  05/31/2010  T:  06/01/2010  Job:  [id number]  Electronically Signed by Karie Soda MD on 06/04/2010 12:30:03 PM

## 2010-06-07 NOTE — Letter (Signed)
Summary: Colonoscopy Letter  [facility]  [address]   [place], [place] [address]   Phone: [phone]  Fax: [phone]      April 17, 2010 MRN: [id number]   [facility] [address] [place], [place]  [address]   Dear David [name],   According to your medical record, it is time for you to schedule a Colonoscopy. The American Cancer Society recommends this procedure as a method to detect early colon cancer. Patients with a family history of colon cancer, or a personal history of colon polyps or inflammatory bowel disease are at increased risk.  This letter has been generated based on the recommendations made at the time of your procedure. If you feel that in your particular situation this may no longer apply, please contact our office.  Please call our office at [phone] to schedule this appointment or to update your records at your earliest convenience.  Thank you for cooperating with [place] to provide you with the very best care possible.   Sincerely,   Claudette Head, M.D.  [facility]astro[facility]ology [facility] [phone]

## 2010-06-07 NOTE — Letter (Signed)
Summary: Helane Gunther DPM  Helane Gunther DPM   Imported By: Sherian Rein 06/13/2009 14:03:16  _____________________________________________________________________  External Attachment:    Type:   Image     Comment:   External Document

## 2010-06-07 NOTE — Assessment & Plan Note (Signed)
Summary: 6 MOS F/U / # / CD   Vital Signs:  Patient profile:   60 year old male Height:      70 inches Weight:      240 pounds BMI:     34.56 O2 Sat:      97 % on Room air Temp:     98.2 degrees F oral Pulse rate:   76 / minute BP sitting:   132 / 80  (left arm) Cuff size:   large  Vitals Entered By: Zella Ball Ewing CMA Duncan Dull) (March 21, 2010 10:51 AM)  O2 Flow:  Room air CC: 6 month Followup/RE   Preventive Care Screening     declines flu shot   Primary Care Provider:  Oliver Barre MD  CC:  6 month Followup/RE.  History of Present Illness: here for wellness - unfort gained 19 lbs but Pt denies CP, worsening sob, doe, wheezing, orthopnea, pnd, worsening LE edema, palps, dizziness or syncope  Pt denies new neuro symptoms such as headache, facial or extremity weakness  Pt denies polydipsia, polyuria  Overall good compliance with meds, trying to follow low chol  diet, wt stable, little excercise however  Denies worsening depressive symptoms, suicidal ideation, or panic.  No fever, wt loss, night sweats, loss of appetite or other constitutional symptoms Overall good compliance with meds, and good tolerability.  Pt states good ability with ADL's, low fall risk, home safety reviewed and adequate, no significant change in hearing or vision, trying to follow lower chol diet, and occasionally active only with regular excercise.   Preventive Screening-Counseling & Management      Drug Use:  no.    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 Take 1 Tablet By Mouth Once A Kjersten Ormiston 2)  Folic Acid 1 Mg Tabs (Folic Acid) .... Take 1 Tablet By Mouth Once A Roni Scow 3)  Gemfibrozil 600 Mg Tabs (Gemfibrozil) .... Take 1 Tablet By Mouth Twice A Tamy Accardo 4)  Lipitor 40 Mg Tabs (Atorvastatin Calcium) .... Take 1 Tablet By Mouth Once A Dontarius Sheley 5)  Ranitidine Hcl 300 Mg Tabs (Ranitidine Hcl) .... Take 1 Tablet By Mouth Once A Alonia Dibuono 6)  Aspirin 81 Mg  Tbec (Aspirin) .... One Tablet By Mouth Every Saiya Crist 7)  Colace 100 Mg  Caps (Docusate Sodium) .Marland Kitchen.. 1 Tablet Twice A Brittinee Risk 8)  Fish Oil   Oil (Fish Oil) .Marland Kitchen.. 1 Gel Caps Twice A Floetta Brickey 9)  Multivitamins   Tabs (Multiple Vitamin) .Marland Kitchen.. 1 Tablet By Mouth Every Jakaila Norment 10)  Canasa 1000 Mg Supp (Mesalamine) .... One Suppository Into Rectum Two Times A Thersea Manfredonia As Needed 11)  Plavix 75 Mg Tabs (Clopidogrel Bisulfate) .Marland Kitchen.. 1 By Mouth Once Daily 12)  Fiber  Powd (Fiber) .Marland KitchenMarland KitchenMarland Kitchen  Use Everyday 13)  Betamethasone Dipropionate 0.05 % Crea (Betamethasone Dipropionate) .... Use Asd Once Daily As Needed 14)  Zyrtec Allergy 10 Mg Caps (Cetirizine Hcl) .Marland Kitchen.. 1 By Mouth Once Daily 15)  Vitamin C Cr 500 Mg Cr-Tabs (Ascorbic Acid) .... 2 By Mouth Once Daily  Current Medications (verified): 1)  Benicar Hct 40-25 Mg Tabs (Olmesartan Medoxomil-Hctz) .... Take 1 Tablet By Mouth Once A Arlow Spiers 2)  Folic Acid 1 Mg Tabs (Folic Acid) .... Take 1 Tablet By Mouth Once A Jacquelynn Friend 3)  Gemfibrozil 600 Mg Tabs (Gemfibrozil) .... Take 1 Tablet By Mouth Twice A Annely Sliva 4)  Lipitor 40 Mg Tabs (Atorvastatin Calcium) .... Take 1 Tablet By Mouth Once A Fantashia Shupert 5)  Ranitidine Hcl 300 Mg Tabs (Ranitidine Hcl) .... Take 1 Tablet By Mouth Once A Sandralee Tarkington 6)  Aspirin 81 Mg   Tbec (Aspirin) .... One Tablet By Mouth Every Blain Hunsucker 7)  Colace 100 Mg  Caps (Docusate Sodium) .Marland Kitchen.. 1 Tablet Twice A Solveig Fangman 8)  Fish Oil   Oil (Fish Oil) .Marland Kitchen.. 1 Gel Caps Twice A Janat Tabbert 9)  Multivitamins   Tabs (Multiple Vitamin) .Marland Kitchen.. 1 Tablet By Mouth Every Rekita Miotke 10)  Canasa 1000 Mg Supp (Mesalamine) .... One Suppository Into Rectum Two Times A Yossef Gilkison As Needed 11)  Plavix 75 Mg Tabs (Clopidogrel Bisulfate) .Marland Kitchen.. 1 By Mouth Once Daily 12)  Fiber  Powd (Fiber) .... Use Everyday 13)  Betamethasone Dipropionate 0.05 % Crea (Betamethasone Dipropionate) .... Use Asd Once Daily As Needed 14)  Zyrtec Allergy 10 Mg Caps (Cetirizine Hcl) .Marland Kitchen.. 1 By Mouth Once Daily 15)  Vitamin C Cr 500 Mg Cr-Tabs (Ascorbic Acid) .... 2 By Mouth Once Daily  Allergies (verified): No Known Drug Allergies  Past History:  Past Medical History: Last updated: 07/19/2008 Diverticulosis Hemorrhoids Adenomatous Colon Polyps Hyperlipidemia Hypertension Cerebrovascular accident-2003 Anticardiolipin antibody positive History of perineal abscess Diabetes mellitus, type II GERD Peripheral vascular disease/carotid artery < 100% Anxiety Depression Cerebrovascular accident, hx of  Past Surgical History: Last updated: 07/31/2009 Hernia Surgery 60 years old hx of perineal abscess s/p AAA s/p right and left popliteal aneurysm repair  Family History: Last updated: 07/19/2008 No FH of Colon Cancer: lung cancer brother mother with desceding aortic aneurysm  Social History: Last updated: 03/21/2010 Married Patient is a former smoker.  Alcohol Use - yes wine daily Daily Caffeine Use  2 cups coffee in the am tea throughout the Zyriah Mask 1 child disable due to stroke Drug use-no  Risk Factors: Smoking Status: quit (12/09/2007)  Social History: Married Patient is a former smoker.  Alcohol Use - yes wine daily Daily Caffeine Use  2 cups coffee in the am tea throughout the Besse Miron 1 child disable due to stroke Drug use-no Drug Use:   no  Review of Systems  The patient denies anorexia, fever, vision loss, decreased hearing, hoarseness, chest pain, syncope, dyspnea on exertion, peripheral edema, prolonged cough, headaches, hemoptysis, abdominal pain, melena, hematochezia, severe indigestion/heartburn, hematuria, muscle weakness, suspicious skin lesions, transient blindness, difficulty walking, depression, unusual weight change, abnormal bleeding, enlarged lymph nodes, and angioedema.         all otherwise negative per pt -    Physical Exam  General:  alert and overweight-appearing., but much improved Head:  normocephalic and atraumatic.   Eyes:  vision grossly intact, pupils equal, and pupils round.   Ears:  R ear normal and L ear normal.   Nose:  no external deformity and no nasal discharge.   Mouth:  no gingival abnormalities and pharynx pink and moist.   Neck:  supple and no masses.   Lungs:  normal respiratory effort and normal breath sounds.   Heart:  normal rate and regular rhythm.   Abdomen:  soft, non-tender, and normal bowel sounds.   Msk:  no joint tenderness and no joint swelling.   Extremities:  no edema, no erythema  Neurologic:  alert & oriented X3 and cranial nerves II-XII intact.   Skin:  color normal and no rashes.   Psych:  not anxious appearing and not depressed appearing.     Impression & Recommendations:  Problem # 1:  Preventive Health Care (ICD-V70.0) Overall doing well, age appropriate education and counseling updated, referral for preventive services and immunizations addressed, dietary counseling and smoking status adressed , most recent labs reviewed and does not need re-done except due for PSA I have personally reviewed and have noted 1.The patient's medical and social history 2.Their use of alcohol, tobacco or illicit drugs 3.Their current medications and supplements 4. Functional ability including ADL's, fall risk, home safety risk, hearing & visual impairment  5.Diet and physical  activities 6.Evidence for depression or mood disorders The patients weight, height, BMI  have been recorded in the chart I have made referrals, counseling and provided education to the patient based review of the above  Orders: TLB-PSA (Prostate Specific Antigen) (84153-PSA)  Problem # 2:  HYPERTENSION (ICD-401.9)  His updated medication list for this problem includes:    Benicar Hct 40-25 Mg Tabs (Olmesartan medoxomil-hctz) .Marland Kitchen... Take 1 tablet by mouth once a Jessa Stinson  BP today: 132/80 Prior BP: 132/72 (07/31/2009)  Labs Reviewed: K+: 4.6 (03/16/2010) Creat: : 1.1 (03/16/2010)   Chol: 131 (03/16/2010)   HDL: 32.80 (03/16/2010)   LDL: 48 (01/13/2009)   TG: 273.0 (03/16/2010) stable overall by hx and exam, ok to continue meds/tx as is   Problem # 3:  DIABETES MELLITUS, TYPE II (ICD-250.00)  His updated medication list for this problem includes:    Benicar Hct 40-25 Mg Tabs (Olmesartan medoxomil-hctz) .Marland Kitchen... Take 1 tablet by mouth once a Tsuyako Jolley    Aspirin 81 Mg Tbec (Aspirin) ..... One tablet by mouth every Maebell Lyvers  Labs Reviewed: Creat: 1.1 (03/16/2010)    Reviewed HgBA1c results: 6.3 (03/16/2010)  5.3 (07/27/2009) stable overall by hx and exam, ok to continue meds/tx as is   Complete Medication List: 1)  Benicar Hct 40-25 Mg Tabs (Olmesartan medoxomil-hctz) .... Take 1 tablet by mouth once a Gitty Osterlund 2)  Folic Acid 1 Mg Tabs (Folic acid) .... Take 1 tablet by mouth once a Wylie Coon 3)  Gemfibrozil 600 Mg Tabs (Gemfibrozil) .... Take 1 tablet by mouth twice a Demica Zook 4)  Lipitor 40 Mg Tabs (Atorvastatin calcium) .... Take 1 tablet by mouth once a Erwin Nishiyama 5)  Ranitidine Hcl 300 Mg Tabs (Ranitidine hcl) .... Take 1 tablet by mouth once a Taiwana Willison 6)  Aspirin 81 Mg Tbec (Aspirin) .... One tablet by mouth every Danyla Wattley 7)  Colace 100 Mg Caps (Docusate sodium) .Marland Kitchen.. 1 tablet twice a Jaquila Santelli 8)  Fish Oil Oil (Fish oil) .Marland Kitchen.. 1 gel caps twice a Nathanyel Defenbaugh 9)  Multivitamins Tabs (Multiple vitamin) .Marland Kitchen.. 1 tablet by mouth every Egan Berkheimer 10)   Canasa 1000 Mg Supp (Mesalamine) .... One suppository into rectum two times a Margie Urbanowicz as needed 11)  Plavix 75 Mg Tabs (Clopidogrel bisulfate) .Marland Kitchen.. 1 by mouth once daily 12)  Fiber Powd (Fiber) .... Use everyday 13)  Betamethasone Dipropionate 0.05 % Crea (Betamethasone  dipropionate) .... Use asd once daily as needed 14)  Zyrtec Allergy 10 Mg Caps (Cetirizine hcl) .Marland Kitchen.. 1 by mouth once daily 15)  Vitamin C Cr 500 Mg Cr-tabs (Ascorbic acid) .... 2 by mouth once daily  Patient Instructions: 1)  Please go to the Lab in the basement for your blood  tests today  - the PSA 2)  Please call the number on the [facility] Card for results of your testing  3)  please call your insurance to find out if the shingles shot is covered 4)  if it is, you can for a  Nurse appt for the shingles shot 5)  Continue all previous medications as before this visit 6)  Please schedule a follow-up appointment in 6 months with: 7)  BMP prior to visit, ICD-9: 250.02 8)  Lipid Panel prior to visit, ICD-9: 9)  HbgA1C prior to visit, ICD-9: Prescriptions: RANITIDINE HCL 300 MG TABS (RANITIDINE HCL) Take 1 tablet by mouth once a Valor Quaintance  #90 x 3   Entered and Authorized by:   Corwin Levins MD   Signed by:   Corwin Levins MD on 03/21/2010   Method used:   Print then Give to Patient   RxID:   [id number] LIPITOR 40 MG TABS (ATORVASTATIN CALCIUM) Take 1 tablet by mouth once a Kazden Largo  #90 x 3   Entered and Authorized by:   Corwin Levins MD   Signed by:   Corwin Levins MD on 03/21/2010   Method used:   Print then Give to Patient   RxID:   [id number] GEMFIBROZIL 600 MG TABS (GEMFIBROZIL) Take 1 tablet by mouth twice a Muneeb Veras  #180 x 3   Entered and Authorized by:   Corwin Levins MD   Signed by:   Corwin Levins MD on 03/21/2010   Method used:   Print then Give to Patient   RxID:   [id number] FOLIC ACID 1 MG TABS (FOLIC ACID) Take 1 tablet by mouth once a Arleatha Philipps  #90 x 3   Entered and Authorized by:   Corwin Levins MD   Signed by:   Corwin Levins MD on 03/21/2010   Method used:   Print then Give to Patient   RxID:   [id number] BENICAR HCT 40-25 MG TABS (OLMESARTAN MEDOXOMIL-HCTZ) Take 1 tablet by mouth once a Arshi Duarte  #90 x 3   Entered and Authorized by:   Corwin Levins MD   Signed by:   Corwin Levins MD on 03/21/2010   Method used:   Print then Give to Patient   RxID:   [id number]    Orders Added: 1)  TLB-PSA (Prostate Specific Antigen) [37106-YIR] 2)  Est. Patient 40-64 years [48546]

## 2010-06-07 NOTE — Letter (Signed)
Summary: [facility]   Imported By: Sherian Rein 03/16/2010 14:52:37  _____________________________________________________________________  External Attachment:    Type:   Image     Comment:   External Document

## 2010-06-07 NOTE — Letter (Signed)
Summary: Colonoscopy Letter  [facility]  [address]   [place], [place] [address]   Phone: [phone]  Fax: [phone]      April 18, 2010 MRN: [id number]   [facility] [address] [place], [place]  [address]   Dear Mr. Locicero,   According to your medical record, it is time for you to schedule a Colonoscopy. The American Cancer Society recommends this procedure as a method to detect early colon cancer. Patients with a family history of colon cancer, or a personal history of colon polyps or inflammatory bowel disease are at increased risk.  This letter has been generated based on the recommendations made at the time of your procedure. If you feel that in your particular situation this may no longer apply, please contact our office.  Please call our office at [phone] to schedule this appointment or to update your records at your earliest convenience.  Thank you for cooperating with [place] to provide you with the very best care possible.   Sincerely,   Claudette Head, M.D.  [facility] Gastroenterology Division [phone]

## 2010-06-07 NOTE — Assessment & Plan Note (Signed)
Summary: 6 MO ROV /NWS $50   Vital Signs:  Patient profile:   60 year old male Height:      69 inches Weight:      214.25 pounds BMI:     31.75 O2 Sat:      97 % on Room air Temp:     96.7 degrees F oral Pulse rate:   62 / minute BP sitting:   132 / 72  (left arm) Cuff size:   large  Vitals Entered ByZella Ball Ewing (July 31, 2009 8:25 AM)  O2 Flow:  Room air  CC: 6 Month ROV/RE   Primary Care Provider:  Oliver Barre MD  CC:  6 Month ROV/RE.  History of Present Illness: overall doing well;  Pt denies CP, sob, doe, wheezing, orthopnea, pnd, worsening LE edema, palps, dizziness or syncope Pt denies new neuro symptoms such as headache, facial or extremity weakness  Pt denies polydipsia, polyuria, or low sugar symptoms such as shakiness improved with eating.  Overall good compliance with meds, trying to follow low chol, DM diet, wt as above, little excercise however  For Vein stripping soon to the left lower extrem only soon per vascular.  BP check freq recently have been similar to today.   Overall lost significant wt from 250 to 214 over the past yr, some of that periop iwht lower appetitie.    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 Take 1  Tablet By Mouth Once A Day 2)  Folic Acid 1 Mg Tabs (Folic Acid) .... Take 1 Tablet By Mouth Once A Day 3)  Gemfibrozil 600 Mg Tabs (Gemfibrozil) .... Take 1 Tablet By Mouth Twice A Day 4)  Lipitor 40 Mg Tabs (Atorvastatin Calcium) .... Take 1 Tablet By Mouth Once A Day 5)  Ranitidine Hcl 300 Mg Tabs (Ranitidine Hcl) .... Take 1 Tablet By Mouth Once A Day 6)  Aspirin 81 Mg  Tbec (Aspirin) .... One Tablet By Mouth Every Day 7)  Colace 100 Mg  Caps (Docusate Sodium) .Marland Kitchen.. 1 Tablet Twice A Day 8)  Fish Oil   Oil (Fish Oil) .Marland Kitchen.. 1 Gel Caps Twice A Day 9)  Multivitamins   Tabs (Multiple Vitamin) .Marland Kitchen.. 1 Tablet By Mouth Every Day 10)  Metformin Hcl 500 Mg Xr24h-Tab (Metformin Hcl) .Marland Kitchen.. 1 By Mouth Each Am 11)  Canasa 1000 Mg Supp (Mesalamine) .... One Suppository Into Rectum Two Times A Day As Needed 12)  Plavix 75 Mg Tabs (Clopidogrel Bisulfate) .Marland Kitchen.. 1 By Mouth Once Daily 13)  Fiber  Powd (Fiber) .... Use Everyday 14)  Betamethasone Dipropionate 0.05 % Crea (Betamethasone Dipropionate) .... Use Asd Once Daily As Needed  Current Medications (verified): 1)  Benicar Hct 40-25 Mg Tabs (Olmesartan Medoxomil-Hctz) .... Take 1 Tablet By Mouth  Once A Day 2)  Folic Acid 1 Mg Tabs (Folic Acid) .... Take 1 Tablet By Mouth Once A Day 3)  Gemfibrozil 600 Mg Tabs (Gemfibrozil) .... Take 1 Tablet By Mouth Twice A Day 4)  Lipitor 40 Mg Tabs (Atorvastatin Calcium) .... Take 1 Tablet By Mouth Once A Day 5)  Ranitidine Hcl 300 Mg Tabs (Ranitidine Hcl) .... Take 1 Tablet By Mouth Once A Day 6)  Aspirin 81 Mg  Tbec (Aspirin) .... One Tablet By Mouth Every Day 7)  Colace 100 Mg  Caps (Docusate Sodium) .Marland Kitchen.. 1 Tablet Twice A Day 8)  Fish Oil   Oil (Fish Oil) .Marland Kitchen.. 1 Gel Caps Twice A Day 9)  Multivitamins   Tabs (Multiple Vitamin) .Marland Kitchen.. 1 Tablet By Mouth Every Day 10)  Canasa 1000 Mg Supp (Mesalamine) .... One Suppository Into Rectum Two Times A Day As Needed 11)  Plavix 75 Mg Tabs (Clopidogrel Bisulfate) .Marland Kitchen.. 1 By Mouth  Once Daily 12)  Fiber  Powd (Fiber) .... Use Everyday 13)  Betamethasone Dipropionate 0.05 % Crea (Betamethasone Dipropionate) .... Use Asd Once Daily As Needed 14)  Zyrtec Allergy 10 Mg Caps (Cetirizine Hcl) .Marland Kitchen.. 1 By Mouth Once Daily 15)  Vitamin C Cr 500 Mg Cr-Tabs (Ascorbic Acid) .... 2 By Mouth Once Daily  Allergies (verified): No Known Drug Allergies  Past History:  Past Medical History: Last updated: 07/19/2008 Diverticulosis Hemorrhoids Adenomatous Colon Polyps Hyperlipidemia Hypertension Cerebrovascular accident-2003 Anticardiolipin antibody positive History of perineal abscess Diabetes mellitus, type II GERD Peripheral vascular disease/carotid artery < 100% Anxiety Depression Cerebrovascular accident, hx of  Social History: Last updated: 01/18/2008 Married Patient is a former smoker.  Alcohol Use - yes wine daily Daily Caffeine Use  2 cups coffee in the am tea throughout the day 1 child disable due to stroke  Risk Factors: Smoking Status: quit (12/09/2007)  Past Surgical History: Hernia Surgery 60 years old hx of perineal abscess s/p AAA s/p right and left popliteal aneurysm repair  Review of Systems       all otherwise negative per pt -    Physical Exam  General:  alert and overweight-appearing., but much improved Head:  normocephalic and atraumatic.   Eyes:  vision grossly intact, pupils equal, and pupils round.   Ears:  R ear normal and L ear normal.   Nose:  no external deformity and no nasal discharge.   Mouth:  no gingival abnormalities and pharynx pink and moist.   Neck:  supple and no masses.   Lungs:  normal respiratory effort and normal breath sounds.   Heart:  normal rate and regular rhythm.   Extremities:  no edema, no erythema    Impression & Recommendations:  Problem # 1:  DIABETES MELLITUS, TYPE II (ICD-250.00)  The following medications were removed from the medication list:    Metformin Hcl 500 Mg Xr24h-tab (Metformin hcl)  .Marland Kitchen... 1 by mouth each am His updated medication list for this problem includes:    Benicar Hct 40-25 Mg Tabs (Olmesartan medoxomil-hctz) .Marland Kitchen... Take 1 tablet by mouth once a day    Aspirin 81 Mg Tbec (Aspirin) ..... One tablet by mouth every day recent a1c excellent with large wt loss in the past yr;  ok to stop the metformin  Labs Reviewed: Creat: 0.9 (07/27/2009)    Reviewed HgBA1c results: 5.3 (07/27/2009)  5.6 (01/13/2009)  Problem # 2:  HYPERLIPIDEMIA (ICD-272.4)  His updated medication list for this problem includes:    Gemfibrozil 600 Mg Tabs (Gemfibrozil) .Marland KitchenMarland KitchenMarland KitchenMarland Kitchen  Take 1 tablet by mouth twice a day    Lipitor 40 Mg Tabs (Atorvastatin calcium) .Marland Kitchen... Take 1 tablet by mouth once a day  Labs Reviewed: SGOT: 22 (01/13/2009)   SGPT: 20 (01/13/2009)   HDL:40.90 (07/27/2009), 28.80 (01/13/2009)  LDL:48 (01/13/2009), DEL (81/19/1478)  Chol:159 (07/27/2009), 116 (01/13/2009)  Trig:268.0 (07/27/2009), 198.0 (01/13/2009)  Problem # 3:  PERIPHERAL VASCULAR DISEASE (ICD-443.9) stable overall by hx and exam, ok to continue meds/tx as is   Problem # 4:  HYPERTENSION (ICD-401.9)  His updated medication list for this problem includes:    Benicar Hct 40-25 Mg Tabs (Olmesartan medoxomil-hctz) .Marland Kitchen... Take 1 tablet by mouth once a day  BP today: 132/72 Prior BP: 108/78 (01/18/2009)  Labs Reviewed: K+: 4.5 (07/27/2009) Creat: : 0.9 (07/27/2009)   Chol: 159 (07/27/2009)   HDL: 40.90 (07/27/2009)   LDL: 48 (01/13/2009)   TG: 268.0 (07/27/2009) stable overall by hx and exam, ok to continue meds/tx as is   Complete Medication List: 1)  Benicar Hct 40-25 Mg Tabs (Olmesartan medoxomil-hctz) .... Take 1 tablet by mouth once a day 2)  Folic Acid 1 Mg Tabs (Folic acid) .... Take 1 tablet by mouth once a day 3)  Gemfibrozil 600 Mg Tabs (Gemfibrozil) .... Take 1 tablet by mouth twice a day 4)  Lipitor 40 Mg Tabs (Atorvastatin calcium) .... Take 1 tablet by mouth once a day 5)  Ranitidine Hcl 300 Mg Tabs  (Ranitidine hcl) .... Take 1 tablet by mouth once a day 6)  Aspirin 81 Mg Tbec (Aspirin) .... One tablet by mouth every day 7)  Colace 100 Mg Caps (Docusate sodium) .Marland Kitchen.. 1 tablet twice a day 8)  Fish Oil Oil (Fish oil) .Marland Kitchen.. 1 gel caps twice a day 9)  Multivitamins Tabs (Multiple vitamin) .Marland Kitchen.. 1 tablet by mouth every day 10)  Canasa 1000 Mg Supp (Mesalamine) .... One suppository into rectum two times a day as needed 11)  Plavix 75 Mg Tabs (Clopidogrel bisulfate) .Marland Kitchen.. 1 by mouth once daily 12)  Fiber Powd (Fiber) .... Use everyday 13)  Betamethasone Dipropionate 0.05 % Crea (Betamethasone dipropionate) .... Use asd once daily as needed 14)  Zyrtec Allergy 10 Mg Caps (Cetirizine hcl) .Marland Kitchen.. 1 by mouth once daily 15)  Vitamin C Cr 500 Mg Cr-tabs (Ascorbic acid) .... 2 by mouth once daily  Patient Instructions: 1)  stop the metformin 2)  Continue all previous medications as before this visit  3)  Please schedule a follow-up appointment in 6 months with  CPX labs and: 4)  HbgA1C prior to visit, ICD-9: 250.02 5)  Urine Microalbumin prior to visit, ICD-9: Prescriptions: RANITIDINE HCL 300 MG TABS (RANITIDINE HCL) Take 1 tablet by mouth once a day  #90 x 3   Entered and Authorized by:   Corwin Levins MD   Signed by:   Corwin Levins MD on 07/31/2009   Method used:   Print then Give to Patient   RxID:   [id number] GEMFIBROZIL 600 MG TABS (GEMFIBROZIL) Take 1 tablet by mouth twice a day  #90 x 3   Entered and Authorized by:   Corwin Levins MD   Signed by:   Corwin Levins MD on 07/31/2009   Method used:   Print then Give to Patient   RxID:   [id number] FOLIC ACID 1 MG TABS (FOLIC ACID) Take 1 tablet by mouth once a day  #90 x 3   Entered and Authorized by:   Corwin Levins MD  Signed by:   Corwin Levins MD on 07/31/2009   Method used:   Print then Give to Patient   RxID:   [id number] BENICAR HCT 40-25 MG TABS (OLMESARTAN MEDOXOMIL-HCTZ) Take 1 tablet by mouth once a day  #90 x 3    Entered and Authorized by:   Corwin Levins MD   Signed by:   Corwin Levins MD on 07/31/2009   Method used:   Print then Give to Patient   RxID:   [id number] LIPITOR 40 MG TABS (ATORVASTATIN CALCIUM) Take 1 tablet by mouth once a day  #90 x 3   Entered and Authorized by:   Corwin Levins MD   Signed by:   Corwin Levins MD on 07/31/2009   Method used:   Print then Give to Patient   RxID:   [id number]

## 2010-06-11 ENCOUNTER — Ambulatory Visit: Payer: Self-pay | Admitting: Surgery

## 2010-06-14 ENCOUNTER — Encounter: Payer: Self-pay | Admitting: Internal Medicine

## 2010-06-26 ENCOUNTER — Telehealth: Payer: Self-pay | Admitting: Gastroenterology

## 2010-06-26 ENCOUNTER — Encounter: Payer: Self-pay | Admitting: Gastroenterology

## 2010-07-03 NOTE — Progress Notes (Signed)
Summary: Long long long story just to ask a simple question   Phone Note Call from Patient Call back at Home Phone [phone]   Call For: Dr Russella Dar Summary of Call: Received a letter in Dec for Colon and is concerned about a complicated hernia  he had in January.  Is waiting to to feel %100 better before having procedure. Anyways, he got some Canasa previously from Dr Russella Dar and noticed they expired in 01-2010 - wonders if he can still use them? Initial call taken by: Leanor Kail [facility],  June 26, 2010 11:55 AM  Follow-up for Phone Call        Pt states his prescription for Canasa suppositories have expired. He wants to know if he can have another prescription. Told pt that he was told at his last office visit on 01/2008 to call back if having any further bleeding and we would consider scheduling a Colonoscopy. Pt does state that he recieved his recall colonoscopy letter and wants to schedule for the end of March. Pt wants to know if he can a refill until then or does he need a office visit first before he can get any refills? Follow-up by: Christie Nottingham CMA [facility]),  June 26, 2010 1:35 PM  Additional Follow-up for Phone Call Additional follow up Details #1::        He can use his expired Canasa or have a refill, if needed, to get him enough until he has colonscopy or and REV in late March. Additional Follow-up by: Meryl Dare MD [facility],  June 26, 2010 1:44 PM    Additional Follow-up for Phone Call Additional follow up Details #2::    Left message for patient  to call back. Pt states he already started using his expired Canasa with immediate response and will continue x 1 week or longer if needed. Pt informs me that he is taking Plavix for CVA in 2003 and PVD. Told pt that he would need to come in for a office visit before we scheduled his procedure. Pt states he has came off his Plavix for his surgery in January already without any complications and also knows that Dr. Myra Gianotti  verbally gave him permission to come off this procedure. I told him that I would ask Dr. Russella Dar to see if he can be a direct Colonoscopy as long as we get approval for him to come off his Plavix x 5 days by Dr. Myra Gianotti. Dr. Russella Dar, I can send a letter to Dr. Myra Gianotti getting approval before his procedure in the end of March. Please advise. Follow-up by: Christie Nottingham CMA [facility]),  June 26, 2010 2:00 PM  Additional Follow-up for Phone Call Additional follow up Details #3:: Details for Additional Follow-up Action Taken: OK to get a letter from Dr Myra Gianotti and have direct schedule colonoscopy.  If is bleeding does not stop with a new prescription of Canasa please ask him to contact us.   Pt will call back if he has any other bleeding or problems after finishing Canasa suppositories. Pt has been scheduled for a colonoscopy on 08/03/10 at 9:00am and previsit on 07/26/10 at 9:00am. Letter faxed to Dr. Myra Gianotti for Plavix clearance. Christie Nottingham CMA [facility])  June 26, 2010 2:42 PM  Additional Follow-up by: Meryl Dare MD [facility],  June 26, 2010 2:32 PM

## 2010-07-03 NOTE — Letter (Signed)
Summary: [facility]   Imported By: Lennie Odor 06/27/2010 11:40:54  _____________________________________________________________________  External Attachment:    Type:   Image     Comment:   External Document

## 2010-07-03 NOTE — Letter (Addendum)
Summary: Anticoagulation Modification Letter  [facility]  [address] [place], [place] [address]   Phone: [phone]  Fax: [phone]    June 26, 2010  Re:    PASTOR SGRO DOB:    08-14-1950 MRN:    [id number]    Dear Dr. Myra Gianotti,  We have scheduled the above patient for an endoscopic procedure. Our records show that  he/she is on anticoagulation therapy. Please advise as to how long the patient may come off their therapy of Plavix prior to the scheduled procedure(s) on 08/03/10.   Please fax back/or route the completed form to [place] at [phone].  Thank you for your help with this matter.  Sincerely,  Christie Nottingham CMA [facility] Dull)   Physician Recommendation:  Hold Plavix 5 days prior ________________  Hold Coumadin 5 days prior ____________  Other ______________________________     Appended Document: Anticoagulation Modification Letter Pt notified to come off Plavix 5 days before his procedure and pt verbalized understanding. Pt will get further instructions regarding his prep at his Previsit on 07/26/10.

## 2010-07-16 ENCOUNTER — Encounter (INDEPENDENT_AMBULATORY_CARE_PROVIDER_SITE_OTHER): Payer: PRIVATE HEALTH INSURANCE

## 2010-07-16 ENCOUNTER — Ambulatory Visit (INDEPENDENT_AMBULATORY_CARE_PROVIDER_SITE_OTHER): Payer: PRIVATE HEALTH INSURANCE | Admitting: Surgery

## 2010-07-16 ENCOUNTER — Ambulatory Visit: Payer: Self-pay | Admitting: Surgery

## 2010-07-16 DIAGNOSIS — I70219 Atherosclerosis of native arteries of extremities with intermittent claudication, unspecified extremity: Secondary | ICD-10-CM

## 2010-07-16 DIAGNOSIS — I724 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: Secondary | ICD-10-CM

## 2010-07-16 DIAGNOSIS — Z48812 Encounter for surgical aftercare following surgery on the circulatory system: Secondary | ICD-10-CM

## 2010-07-17 NOTE — Assessment & Plan Note (Addendum)
OFFICE VISIT  [name], [name] DOB:  04/05/1951                                       07/16/2010 [id number]#:[id number]  REASON FOR VISIT:  Followup.  The patient is a 60 year old gentleman who underwent open abdominal aortic aneurysm repair by Dr. Madilyn Fireman in 2010.  I treated a right popliteal aneurysm with Viabahn stent also in 12/2008 as well as a left popliteal aneurysm with above to below knee bypass in October of 2010. The patient did develop a midline abdominal hernia which was treated by Dr. Michaell Cowing via laparoscopic approach.  He comes back in today.  He is exercising regularly.  He has no new complaints.  PHYSICAL EXAMINATION:  Vital signs:  His heart rate is 65, blood pressure 134/88, respiratory rate 20.  General:  He is well-appearing, in no distress.  HEENT:  Within normal limits.  Cardiovascular:  Regular rate and rhythm.  I do not appreciate a pulse in either extremity. Abdomen:  Soft.  No hernia.  All incisions are healed.  Skin:  No rash. Extremities:  Minimal edema.  DIAGNOSTIC STUDIES:  The right superficial femoral/popliteal stent is patent.  There is a greater than 75% stenosis in the proximal anastomosis of the left above to below knee popliteal bypass graft. Velocities are 507 cm/sec.  ABI is stable on the left at 0.8 and on the right is stable at 1.1.  ASSESSMENT:  Abdominal and popliteal aneurysm.  PLAN:  With evidence of increased velocities at the proximal anastomosis of the left leg bypass I think this needs to be further evaluated and intervened on if necessary.  His aortic graft is a tube graft so I will plan on accessing the right groin, studying both legs and evaluating the proximal anastomosis on the left.  Because of his surgical history he may require antegrade access on the left to get to his proximal area. However, this will be a decision made at the time of his procedure. This has been scheduled for April 3.    Jorge Ny, MD Electronically Signed  VWB/MEDQ  D:  07/16/2010  T:  07/17/2010  Job:  [id number]

## 2010-07-17 NOTE — Letter (Signed)
Summary: Anticoagulation Modification Letter / [facility]  Anticoagulation Modification Letter / [facility]   Imported By: Lennie Odor 07/12/2010 11:25:44  _____________________________________________________________________  External Attachment:    Type:   Image     Comment:   External Document

## 2010-07-26 ENCOUNTER — Ambulatory Visit (AMBULATORY_SURGERY_CENTER): Payer: PRIVATE HEALTH INSURANCE | Admitting: *Deleted

## 2010-07-26 VITALS — Ht 70.0 in | Wt 232.0 lb

## 2010-07-26 DIAGNOSIS — Z9889 Other specified postprocedural states: Secondary | ICD-10-CM

## 2010-07-26 DIAGNOSIS — Z86010 Personal history of colonic polyps: Secondary | ICD-10-CM

## 2010-07-26 MED ORDER — PEG-KCL-NACL-NASULF-NA ASC-C 100 G PO SOLR: 1.0000 | Freq: Once | ORAL | Status: AC

## 2010-07-27 ENCOUNTER — Encounter: Payer: Self-pay | Admitting: Gastroenterology

## 2010-07-31 NOTE — Procedures (Unsigned)
BYPASS GRAFT EVALUATION  INDICATION:  Aneurysm of the lower extremities.  HISTORY: Diabetes:  No. Cardiac:  No. Hypertension:  Yes. Smoking:  Previous. Previous Surgery:  Left above-knee to below-knee popliteal bypass graft on 02/14/2009, right SFA/popliteal stent on 12/07/2010, and a AAA repair on 12/13/2008.  SINGLE LEVEL ARTERIAL EXAM                              RIGHT              LEFT Brachial:                    132                130 Anterior tibial:             149                105 Posterior tibial:            140                95 Peroneal: Ankle/brachial index:        1.13               0.80  PREVIOUS ABI:  Date: 12/04/2009  RIGHT:  1.09  LEFT:  0.77  LOWER EXTREMITY BYPASS GRAFT DUPLEX EXAM:  DUPLEX:  Right patent superficial femoral and popliteal artery stents. Left proximal anastomosis of the above-knee to below-knee popliteal bypass presents with stenosis of >75% with a peak systolic velocity of 507 cm/s.  IMPRESSION: 1. Right patent superficial femoral artery popliteal stent without     evidence of hemodynamically significant stenosis. 2. Left >75% stenosis of the proximal anastomosis of the above-knee to     below-knee popliteal bypass graft. 3. Ankle brachial indices are essentially unchanged since the previous     study on 12/04/2009.  ___________________________________________ V. Charlena Cross, MD  SH/MEDQ  D:  07/16/2010  T:  07/16/2010  Job:  [id number]

## 2010-08-01 IMAGING — CR DG CHEST 1V PORT
1 series · 1 of 1 positions shown · non-contrast
Comparison: 12/13/2008.

CLINICAL DATA: Right-sided Swan-Ganz catheter placement.  Evaluate
position.

PORTABLE CHEST - 1 VIEW

[view not recorded]
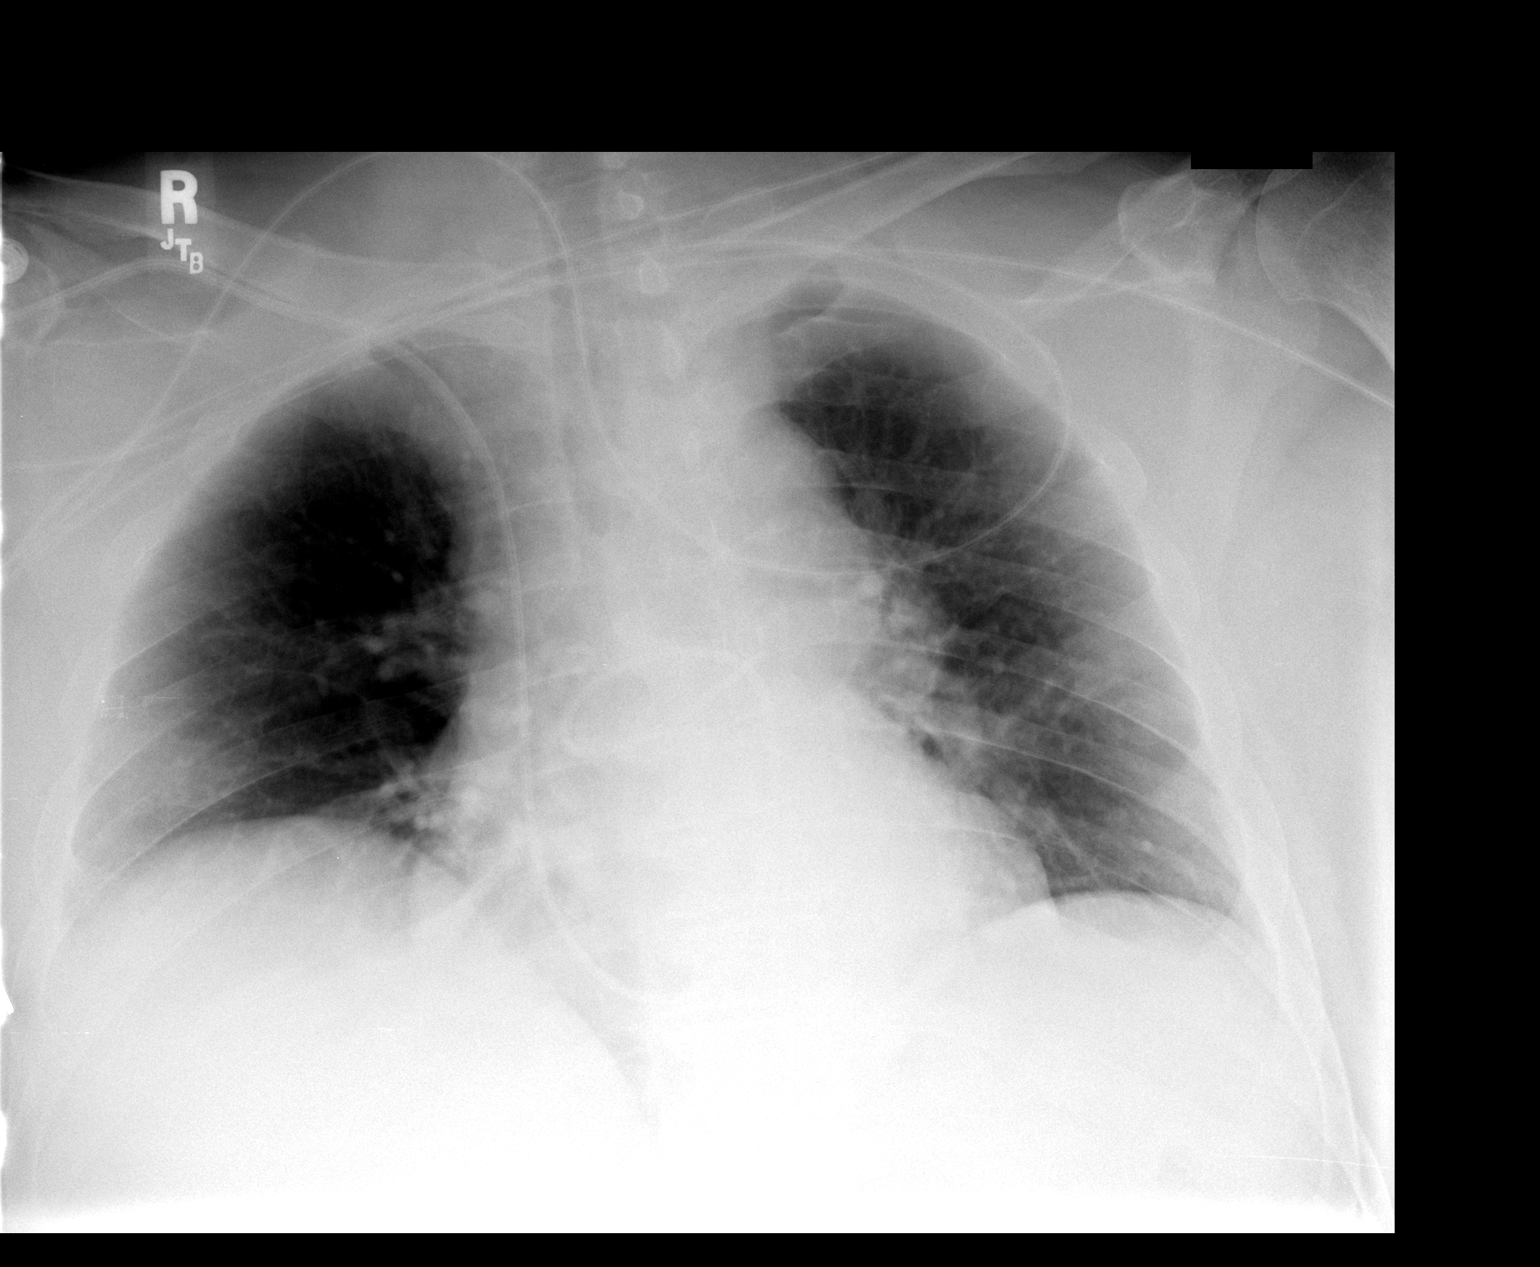

[1 of 1 positions shown; findings below may reference images not displayed]

FINDINGS: Low lung volumes are present.  Right subclavian approach
Swan-Ganz catheter is present.  There is a redundant loop in the
pulmonary outflow tract and main pulmonary artery.  The tip is
directed towards the left hilum and left pulmonary artery.  The tip
appears to be within the left pulmonary artery.  Bibasilar
atelectasis.  The monitoring leads are projected over the upper
chest. No pneumothorax.
IMPRESSION: Interval placement of a right subclavian Swan-Ganz catheter with
redundant loop in the pulmonary artery and the tip in the proximal
left pulmonary artery.

## 2010-08-02 ENCOUNTER — Encounter: Payer: Self-pay | Admitting: *Deleted

## 2010-08-03 ENCOUNTER — Encounter: Payer: Self-pay | Admitting: Gastroenterology

## 2010-08-03 ENCOUNTER — Ambulatory Visit (AMBULATORY_SURGERY_CENTER): Payer: PRIVATE HEALTH INSURANCE | Admitting: Gastroenterology

## 2010-08-03 ENCOUNTER — Telehealth: Payer: Self-pay | Admitting: Gastroenterology

## 2010-08-03 VITALS — BP 153/91 | HR 63 | Temp 96.6°F | Resp 17 | Ht 70.0 in | Wt 225.0 lb

## 2010-08-03 DIAGNOSIS — Z86010 Personal history of colonic polyps: Secondary | ICD-10-CM

## 2010-08-03 DIAGNOSIS — K648 Other hemorrhoids: Secondary | ICD-10-CM

## 2010-08-03 DIAGNOSIS — Z1211 Encounter for screening for malignant neoplasm of colon: Secondary | ICD-10-CM

## 2010-08-03 DIAGNOSIS — K5730 Diverticulosis of large intestine without perforation or abscess without bleeding: Secondary | ICD-10-CM

## 2010-08-03 MED ORDER — MESALAMINE 1000 MG RE SUPP: 1000.0000 mg | Freq: Two times a day (BID) | RECTAL | Status: DC | PRN

## 2010-08-03 NOTE — Telephone Encounter (Signed)
error 

## 2010-08-03 NOTE — Patient Instructions (Signed)
Please read the handouts regarding your diagnoses.    Your hemorrhoids might be painful today due to the injections.  The Canasa will be called into your pharmacy by Dr. Ardell Isaacs medical assistant on the 3rd floor.  Do call us if you have any questions or concerns.   Dr. Charline Bills that you can start your plavix today.

## 2010-08-03 NOTE — Telephone Encounter (Signed)
Spoke with patient and he asked about taking a fiber supplement.   I told him that it was fine if he needed it tonight.  He also asked about his plavix and I told him that Dr. Russella Dar stated that it was okay for him to start back on all of his meds.  I referred him to the AVS sheet given to his wife this am.   He thanked me for my time, and he will call if he has any further questions.

## 2010-08-06 ENCOUNTER — Telehealth: Payer: Self-pay

## 2010-08-06 NOTE — Telephone Encounter (Signed)
Follow up Call- Patient questions:  Do you have a fever, pain , or abdominal swelling? no Pain Score  0 *  Have you tolerated food without any problems? yes  Have you been able to return to your normal activities? yes  Do you have any questions about your discharge instructions: Diet   no Medications  no Follow up visit  no  Do you have questions or concerns about your Care? no  Actions: * If pain score is 4 or above: No action needed, pain <4. Pt had no concerns.maw

## 2010-08-07 ENCOUNTER — Ambulatory Visit (HOSPITAL_COMMUNITY)
Admission: RE | Admit: 2010-08-07 | Discharge: 2010-08-07 | Disposition: A | Discharge status: Home / Self Care | Payer: No Typology Code available for payment source | Source: Ambulatory Visit | Attending: Surgery | Admitting: Surgery

## 2010-08-07 DIAGNOSIS — I7779 Dissection of other artery: Secondary | ICD-10-CM | POA: Insufficient documentation

## 2010-08-07 DIAGNOSIS — T82898A Other specified complication of vascular prosthetic devices, implants and grafts, initial encounter: Secondary | ICD-10-CM | POA: Insufficient documentation

## 2010-08-07 DIAGNOSIS — I70219 Atherosclerosis of native arteries of extremities with intermittent claudication, unspecified extremity: Secondary | ICD-10-CM

## 2010-08-07 DIAGNOSIS — Y832 Surgical operation with anastomosis, bypass or graft as the cause of abnormal reaction of the patient, or of later complication, without mention of misadventure at the time of the procedure: Secondary | ICD-10-CM | POA: Insufficient documentation

## 2010-08-07 LAB — POCT I-STAT, CHEM 8
BUN: 14 mg/dL (ref 6–23)
Chloride: 100 mEq/L (ref 96–112)
Creatinine, Ser: 1 mg/dL (ref 0.4–1.5)
Sodium: 134 mEq/L — ABNORMAL LOW (ref 135–145)

## 2010-08-07 LAB — POCT ACTIVATED CLOTTING TIME: Activated Clotting Time: 205 seconds

## 2010-08-07 NOTE — Procedures (Signed)
Summary: Colonoscopy  Patient: David [name] Note: All result statuses are Final unless otherwise noted.  Tests: (1) Colonoscopy (COL)   COL Colonoscopy           DONE     [facility]     [address]     [place], [place]  [address]          COLONOSCOPY PROCEDURE REPORT          PATIENT:  David, [name]  MR#:  [id number]     BIRTHDATE:  1951/03/07, 59 yrs. old  GENDER:  male     ENDOSCOPIST:  Judie Petit T. Russella Dar, MD, [facility]          PROCEDURE DATE:  08/03/2010     PROCEDURE:  Colon w/ inj sclerosis of hemorrhoids     ASA CLASS:  Class II     INDICATIONS:  1) surveillance and high-risk screening  2) history     of pre-cancerous (adenomatous) colon polyps: 04/2005.     MEDICATIONS:   Fentanyl 125 mcg IV, Versed 12 mg IV, Benadryl 50     mg IV     DESCRIPTION OF PROCEDURE:   After the risks benefits and     alternatives of the procedure were thoroughly explained, informed     consent was obtained.  Digital rectal exam was performed and     revealed no abnormalities.   The LB [id number] endoscope     was introduced through the anus and advanced to the cecum, which     was identified by both the appendix and ileocecal valve, without     limitations.  The quality of the prep was good, using MoviPrep.     The instrument was then slowly withdrawn as the colon was fully     examined.     <<PROCEDUREIMAGES>>     FINDINGS:  Moderate diverticulosis was found in the sigmoid to     transverse colon.  Normal colonoscopy without polyps, masses,     vascular ectasias, or inflammatory changes. Retroflexed views in     the rectum revealed internal hemorrhoids, moderate. 2 cc of 23.4%     saline injected into internal hemorrhoids above the dentate line.     The time to cecum =  4  minutes. The scope was then withdrawn (time     =  12.33  min) from the patient and the procedure completed.          COMPLICATIONS:  None          ENDOSCOPIC IMPRESSION:     1) Moderate  diverticulosis in the sigmoid to transverse colon     2) Internal hemorrhoids          RECOMMENDATIONS:     1) High fiber diet with liberal fluid intake.     2) Repeat Colonoscopy in 5 years.     3) Consider sedation with MAC for future procedures          Avrey Hyser T. Russella Dar, MD, [facility]          CC:  Corwin Levins, MD          n.     Rosalie DoctorVenita Lick. Chinaza Rooke at 08/03/2010 09:50 AM          Gearldine Bienenstock, [phone]  Note: An exclamation mark (!) indicates a result that was not dispersed into the flowsheet. Document Creation Date: 08/03/2010 9:50 AM _______________________________________________________________________  (1) Order result status: Final Collection or  observation date-time: 08/03/2010 09:43 Requested date-time:  Receipt date-time:  Reported date-time:  Referring Physician:   Ordering Physician: Claudette Head [phone]) Specimen Source:  Source: Launa Grill Order Number: [id number] Lab site:

## 2010-08-09 LAB — BASIC METABOLIC PANEL
BUN: 10 mg/dL (ref 6–23)
GFR calc non Af Amer: >60 mL/min (ref >60)
Potassium: 3.7 mEq/L (ref 3.5–5.1)
Sodium: 139 mEq/L (ref 135–145)

## 2010-08-09 LAB — URINALYSIS, ROUTINE W REFLEX MICROSCOPIC
Nitrite: NEGATIVE
Specific Gravity, Urine: 1.019 (ref 1.005–1.030)
pH: 7 (ref 5.0–8.0)

## 2010-08-09 LAB — CBC
HCT: 32.3 % — ABNORMAL LOW (ref 39.0–52.0)
HCT: 38.9 % — ABNORMAL LOW (ref 39.0–52.0)
Platelets: 176 10*3/uL (ref 150–400)
Platelets: 204 10*3/uL (ref 150–400)
RBC: 4.27 MIL/uL (ref 4.22–5.81)
WBC: 4.4 10*3/uL (ref 4.0–10.5)
WBC: 4.7 10*3/uL (ref 4.0–10.5)

## 2010-08-09 LAB — GLUCOSE, CAPILLARY
Glucose-Capillary: 103 mg/dL — ABNORMAL HIGH (ref 70–99)
Glucose-Capillary: 110 mg/dL — ABNORMAL HIGH (ref 70–99)
Glucose-Capillary: 113 mg/dL — ABNORMAL HIGH (ref 70–99)
Glucose-Capillary: 115 mg/dL — ABNORMAL HIGH (ref 70–99)
Glucose-Capillary: 134 mg/dL — ABNORMAL HIGH (ref 70–99)
Glucose-Capillary: 168 mg/dL — ABNORMAL HIGH (ref 70–99)
Glucose-Capillary: 92 mg/dL (ref 70–99)

## 2010-08-09 LAB — COMPREHENSIVE METABOLIC PANEL
AST: 22 U/L (ref 0–37)
Albumin: 4.1 g/dL (ref 3.5–5.2)
Alkaline Phosphatase: 32 U/L — ABNORMAL LOW (ref 39–117)
BUN: 15 mg/dL (ref 6–23)
Chloride: 105 mEq/L (ref 96–112)
GFR calc Af Amer: >60 mL/min (ref >60)
Potassium: 4 mEq/L (ref 3.5–5.1)
Total Bilirubin: 0.4 mg/dL (ref 0.3–1.2)

## 2010-08-09 LAB — POCT I-STAT GLUCOSE: Operator id: 173791

## 2010-08-09 LAB — URINE MICROSCOPIC-ADD ON

## 2010-08-09 LAB — TYPE AND SCREEN

## 2010-08-11 LAB — CBC
HCT: 36.8 % — ABNORMAL LOW (ref 39.0–52.0)
HCT: 38.2 % — ABNORMAL LOW (ref 39.0–52.0)
Hemoglobin: 10 g/dL — ABNORMAL LOW (ref 13.0–17.0)
Hemoglobin: 11.2 g/dL — ABNORMAL LOW (ref 13.0–17.0)
Hemoglobin: 12.9 g/dL — ABNORMAL LOW (ref 13.0–17.0)
Hemoglobin: 13.5 g/dL (ref 13.0–17.0)
MCHC: 34.4 g/dL (ref 30.0–36.0)
MCV: 95.2 fL (ref 78.0–100.0)
Platelets: 167 10*3/uL (ref 150–400)
Platelets: 214 10*3/uL (ref 150–400)
Platelets: 217 10*3/uL (ref 150–400)
RBC: 4.02 MIL/uL — ABNORMAL LOW (ref 4.22–5.81)
RDW: 12.3 % (ref 11.5–15.5)
RDW: 12.5 % (ref 11.5–15.5)
RDW: 12.6 % (ref 11.5–15.5)
WBC: 5.5 10*3/uL (ref 4.0–10.5)
WBC: 6.2 10*3/uL (ref 4.0–10.5)
WBC: 9 10*3/uL (ref 4.0–10.5)

## 2010-08-11 LAB — BASIC METABOLIC PANEL
BUN: 18 mg/dL (ref 6–23)
CO2: 23 mEq/L (ref 19–32)
Calcium: 8.7 mg/dL (ref 8.4–10.5)
Chloride: 100 mEq/L (ref 96–112)
Creatinine, Ser: 0.9 mg/dL (ref 0.4–1.5)
Creatinine, Ser: 1.1 mg/dL (ref 0.4–1.5)
GFR calc non Af Amer: >60 mL/min (ref >60)
GFR calc non Af Amer: >60 mL/min (ref >60)
Glucose, Bld: 153 mg/dL — ABNORMAL HIGH (ref 70–99)
Glucose, Bld: 226 mg/dL — ABNORMAL HIGH (ref 70–99)
Potassium: 3.7 mEq/L (ref 3.5–5.1)
Sodium: 138 mEq/L (ref 135–145)

## 2010-08-11 LAB — GLUCOSE, CAPILLARY
Glucose-Capillary: 112 mg/dL — ABNORMAL HIGH (ref 70–99)
Glucose-Capillary: 114 mg/dL — ABNORMAL HIGH (ref 70–99)
Glucose-Capillary: 119 mg/dL — ABNORMAL HIGH (ref 70–99)
Glucose-Capillary: 123 mg/dL — ABNORMAL HIGH (ref 70–99)
Glucose-Capillary: 123 mg/dL — ABNORMAL HIGH (ref 70–99)
Glucose-Capillary: 125 mg/dL — ABNORMAL HIGH (ref 70–99)
Glucose-Capillary: 128 mg/dL — ABNORMAL HIGH (ref 70–99)
Glucose-Capillary: 138 mg/dL — ABNORMAL HIGH (ref 70–99)
Glucose-Capillary: 138 mg/dL — ABNORMAL HIGH (ref 70–99)
Glucose-Capillary: 139 mg/dL — ABNORMAL HIGH (ref 70–99)
Glucose-Capillary: 144 mg/dL — ABNORMAL HIGH (ref 70–99)
Glucose-Capillary: 151 mg/dL — ABNORMAL HIGH (ref 70–99)
Glucose-Capillary: 153 mg/dL — ABNORMAL HIGH (ref 70–99)
Glucose-Capillary: 161 mg/dL — ABNORMAL HIGH (ref 70–99)
Glucose-Capillary: 172 mg/dL — ABNORMAL HIGH (ref 70–99)
Glucose-Capillary: 185 mg/dL — ABNORMAL HIGH (ref 70–99)

## 2010-08-11 LAB — BLOOD GAS, ARTERIAL
Bicarbonate: 23.7 mEq/L (ref 20.0–24.0)
TCO2: 24.8 mmol/L (ref 0–100)
pCO2 arterial: 33.5 mmHg — ABNORMAL LOW (ref 35.0–45.0)
pH, Arterial: 7.464 — ABNORMAL HIGH (ref 7.350–7.450)
pO2, Arterial: 86.8 mmHg (ref 80.0–100.0)

## 2010-08-11 LAB — URINALYSIS, ROUTINE W REFLEX MICROSCOPIC
Hgb urine dipstick: NEGATIVE
Specific Gravity, Urine: 1.023 (ref 1.005–1.030)
Urobilinogen, UA: 0.2 mg/dL (ref 0.0–1.0)
pH: 6 (ref 5.0–8.0)

## 2010-08-11 LAB — POCT I-STAT 7, (LYTES, BLD GAS, ICA,H+H)
Calcium, Ion: 1.1 mmol/L — ABNORMAL LOW (ref 1.12–1.32)
Hemoglobin: 10.2 g/dL — ABNORMAL LOW (ref 13.0–17.0)
Potassium: 4 mEq/L (ref 3.5–5.1)
pCO2 arterial: 37.9 mmHg (ref 35.0–45.0)
pH, Arterial: 7.398 (ref 7.350–7.450)

## 2010-08-11 LAB — COMPREHENSIVE METABOLIC PANEL
ALT: 21 U/L (ref 0–53)
AST: 22 U/L (ref 0–37)
Albumin: 3.1 g/dL — ABNORMAL LOW (ref 3.5–5.2)
Albumin: 3.8 g/dL (ref 3.5–5.2)
Alkaline Phosphatase: 25 U/L — ABNORMAL LOW (ref 39–117)
Alkaline Phosphatase: 32 U/L — ABNORMAL LOW (ref 39–117)
BUN: 20 mg/dL (ref 6–23)
CO2: 24 mEq/L (ref 19–32)
Chloride: 104 mEq/L (ref 96–112)
GFR calc Af Amer: >60 mL/min (ref >60)
GFR calc non Af Amer: >60 mL/min (ref >60)
Glucose, Bld: 166 mg/dL — ABNORMAL HIGH (ref 70–99)
Glucose, Bld: 196 mg/dL — ABNORMAL HIGH (ref 70–99)
Potassium: 3.6 mEq/L (ref 3.5–5.1)
Potassium: 4.2 mEq/L (ref 3.5–5.1)
Sodium: 135 mEq/L (ref 135–145)
Total Bilirubin: 0.9 mg/dL (ref 0.3–1.2)
Total Protein: 6.2 g/dL (ref 6.0–8.3)

## 2010-08-11 LAB — TYPE AND SCREEN: Antibody Screen: NEGATIVE

## 2010-08-11 LAB — POCT I-STAT, CHEM 8
Calcium, Ion: 1.21 mmol/L (ref 1.12–1.32)
Chloride: 105 mEq/L (ref 96–112)
Glucose, Bld: 189 mg/dL — ABNORMAL HIGH (ref 70–99)
HCT: 42 % (ref 39.0–52.0)
Hemoglobin: 14.3 g/dL (ref 13.0–17.0)
Potassium: 4 mEq/L (ref 3.5–5.1)

## 2010-08-11 LAB — PROTIME-INR
INR: 1 (ref 0.00–1.49)
INR: 1.1 (ref 0.00–1.49)
INR: 1.2 (ref 0.00–1.49)
Prothrombin Time: 14.5 seconds (ref 11.6–15.2)
Prothrombin Time: 15.4 seconds — ABNORMAL HIGH (ref 11.6–15.2)

## 2010-08-11 LAB — APTT: aPTT: 27 seconds (ref 24–37)

## 2010-08-11 LAB — ABO/RH: ABO/RH(D): B POS

## 2010-08-12 LAB — POCT I-STAT, CHEM 8
BUN: 19 mg/dL (ref 6–23)
Calcium, Ion: 1.3 mmol/L (ref 1.12–1.32)
Chloride: 102 mEq/L (ref 96–112)
Glucose, Bld: 180 mg/dL — ABNORMAL HIGH (ref 70–99)
TCO2: 28 mmol/L (ref 0–100)

## 2010-08-12 LAB — PROTIME-INR: Prothrombin Time: 15 seconds (ref 11.6–15.2)

## 2010-08-27 ENCOUNTER — Ambulatory Visit (INDEPENDENT_AMBULATORY_CARE_PROVIDER_SITE_OTHER): Payer: PRIVATE HEALTH INSURANCE | Admitting: Surgery

## 2010-08-27 ENCOUNTER — Encounter (INDEPENDENT_AMBULATORY_CARE_PROVIDER_SITE_OTHER): Payer: PRIVATE HEALTH INSURANCE

## 2010-08-27 DIAGNOSIS — I70219 Atherosclerosis of native arteries of extremities with intermittent claudication, unspecified extremity: Secondary | ICD-10-CM

## 2010-08-27 DIAGNOSIS — Z48812 Encounter for surgical aftercare following surgery on the circulatory system: Secondary | ICD-10-CM

## 2010-08-27 DIAGNOSIS — I739 Peripheral vascular disease, unspecified: Secondary | ICD-10-CM

## 2010-08-28 NOTE — Assessment & Plan Note (Signed)
OFFICE VISIT  David [name], David [name] DOB:  1950/08/09                                       08/27/2010 EAVWU#:[id number]  The patient comes back in today for followup.  He is well known to me having undergone open abdominal aortic aneurysm by Dr. Madilyn Fireman in 2012. Myself and Dr. Madilyn Fireman placed a Viabahn stent in his right popliteal aneurysm in 12/2008.  I treated his left popliteal aneurysm with an above to below knee bypass in October of 2010.  The patient has had a midline abdominal hernia treated laparoscopically by Dr. Michaell Cowing recently. When I last saw him he had a decrease in his ABI on the left with elevated velocities proximally.  He therefore went for arteriogram and operative findings included a high-grade stenosis within the above knee popliteal artery as well as extending into the bypass graft proximal anastomosis.  Balloon angioplasty was performed which resulted in dissection and therefore the area was treated with Absolute Pro 6 x 40 stent.  The patient is back today for followup.  He did very well with this procedure and has no complaints.  PHYSICAL EXAMINATION:  Heart rate 60, blood pressure 172/100, respiratory rate 16.  General:  He is well-appearing and in no distress. Abdomen:  His abdomen is soft.  Respirations:  Nonlabored.  Extremities: Warm, well-perfused.  There is no ulceration.  Duplex ultrasound was performed, shows an ABI of 1.0 on the right, 0.94 on the left.  ASSESSMENT AND PLAN:  Status post left superficial femoral artery / popliteal stenting.  Overall the patient is doing very well.  We need to keep close surveillance on him for his multiple aneurysm repairs.  He will come back in 3 months for complete studies to include his carotid, he has a known carotid occlusion.    Jorge Ny, MD Electronically Signed  VWB/MEDQ  D:  08/27/2010  T:  08/28/2010  Job:  [id number]  cc:   Corwin Levins, MD

## 2010-09-11 ENCOUNTER — Other Ambulatory Visit: Payer: Self-pay

## 2010-09-11 ENCOUNTER — Other Ambulatory Visit: Payer: Self-pay | Admitting: Internal Medicine

## 2010-09-13 ENCOUNTER — Other Ambulatory Visit (INDEPENDENT_AMBULATORY_CARE_PROVIDER_SITE_OTHER): Payer: PRIVATE HEALTH INSURANCE

## 2010-09-13 ENCOUNTER — Encounter: Payer: Self-pay | Admitting: Internal Medicine

## 2010-09-13 ENCOUNTER — Other Ambulatory Visit: Payer: Self-pay | Admitting: *Deleted

## 2010-09-13 DIAGNOSIS — Z0001 Encounter for general adult medical examination with abnormal findings: Secondary | ICD-10-CM | POA: Insufficient documentation

## 2010-09-13 DIAGNOSIS — R972 Elevated prostate specific antigen [PSA]: Secondary | ICD-10-CM | POA: Insufficient documentation

## 2010-09-13 DIAGNOSIS — E119 Type 2 diabetes mellitus without complications: Secondary | ICD-10-CM

## 2010-09-13 HISTORY — DX: Elevated prostate specific antigen (PSA): R97.20

## 2010-09-13 LAB — HEMOGLOBIN A1C: Hgb A1c MFr Bld: 5.7 % (ref 4.6–6.5)

## 2010-09-13 LAB — LIPID PANEL
HDL: 34.5 mg/dL — ABNORMAL LOW (ref >39.00)
LDL Cholesterol: 51 mg/dL (ref 0–99)
Total CHOL/HDL Ratio: 3
VLDL: 20.6 mg/dL (ref 0.0–40.0)

## 2010-09-13 LAB — BASIC METABOLIC PANEL
BUN: 15 mg/dL (ref 6–23)
CO2: 29 mEq/L (ref 19–32)
Chloride: 101 mEq/L (ref 96–112)
GFR: 86.44 mL/min (ref >60.00)
Glucose, Bld: 122 mg/dL — ABNORMAL HIGH (ref 70–99)
Potassium: 4.1 mEq/L (ref 3.5–5.1)
Sodium: 138 mEq/L (ref 135–145)

## 2010-09-13 LAB — PSA: PSA: 2.28 ng/mL (ref 0.10–4.00)

## 2010-09-13 NOTE — Progress Notes (Signed)
Quick Note:  Voice message left on [employer] system - lab is negative, normal or otherwise stable, pt to continue same tx ______ 

## 2010-09-17 ENCOUNTER — Encounter: Payer: Self-pay | Admitting: Internal Medicine

## 2010-09-17 NOTE — Op Note (Signed)
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  PROCEDURE PERFORMED: 1. Ultrasound access right femoral artery. 2. Abdominal aortogram. 3. Left leg runoff. 4. Third-order catheterization. 5. Angioplasty, left popliteal artery and popliteal bypass graft. 6. Followup x2. 7. Stent left popliteal artery.  INDICATIONS:  Mr. Finigan is a 60 year old gentleman, previously undergone abdominal aneurysm repair via a tube graft.  He also has had a Viabahn placed in his right popliteal artery for popliteal aneurysm and went open repair of a left popliteal aneurysm.  He has developed progressive narrowing at the proximal anastomosis on the left.  He comes in today for arteriogram and possible intervention.  PROCEDURE:  The patient was identified in the holding area, taken to room #8, placed supine on the table.  The right groin was prepped and draped in usual fashion.  A time-out was called.  The right femoral artery was evaluated with ultrasound and found to be patent, but calcified.  The digital ultrasound image was acquired.  The artery was accessed under ultrasound guidance.  An 18-gauge needle and an 0.035 wire was advanced in the aorta under fluoroscopic visualization.  A 5- French sheath was placed over the wire and Omniflush catheter was advanced at the level of L1.  Abdominal aortogram was obtained.  Next, using the Omniflush catheter and a Glidewire, the aortic bifurcation was crossed.  Catheters were placed in the  left external iliac artery and left leg runoff was obtained.  FINDINGS:  Aortogram:  There are single renal arteries bilaterally, which are widely patent.  The infrarenal abdominal tube graft is widely patent.  Bilateral common, external, internal, and iliac arteries are widely patent.  Left lower extremity:  The left common femoral artery was patent, but calcified.  The left profunda femoral artery is widely patent.  The left superficial femoral artery is heavily calcified, but patent.  As the popliteal artery crosses the bone edge, which correlates roughly with the area of the proximal anastomosis.  There is an area of luminal narrowing approximately 80%.  The remaining portion of bypass graft is widely patent.  The patient has three-vessel runoff.  At this point in time, the decision was made to intervene over a Versacore wire.  A 5-French 45-cm Ansell 1 sheath was placed.  The patient was then given systemic heparinization.  I was easily able to cross the lesion with a Versacore wire.  I then performed primary balloon angioplasty across the area of concern.  This was done with a 4- mm balloon, taken to 8 atmospheres and held out for 1 minute. Completion study was then performed, which showed improved flow through the area was concerned, however, there was a  residual stenosis like it to upsize the balloon to a 5 x 4 balloon.  Balloon angioplasty was then performed, taking the balloon to nominal pressure, which was 8 atmospheres and held out for 1 minute.  Followup study revealed dissection across the area of angioplasty.  I then placed the 4-mm balloon back down and held it out for 3 minutes to see if that we get the dissection flap to settle down.  I did this on 2 occasions with angiograms in between, which showed persistent dissection.  At this point, I felt that although this was not hemodynamically significant due to the size that I needed to cover this with a stent.  In order  to do this, I needed to upsize my sheath.  This was done over a  0.014 Spartacore wire.  A 6-French Ansell 1 sheath was placed.  I then selected an Abbott Absolute Pro self-expanding stent and placed the stent across the dissection.  The stent was molded to confirmation with a 4-mm balloon.  A completion study was performed.  There was still residual contrast filling into the dissection flap, however, this was not flow limiting.  The stent was widely patent.  At this point in time, I was satisfied with our end result.  Catheters and wires were removed. The sheath was pulled back to the right external iliac artery and the patient was taken to holding area for sheath pull once his coagulation profile corrects.  IMPRESSION:  High-grade distal left above-knee popliteal artery/proximal bypass graft stenosis, treated with a balloon angioplasty, which resulted in a dissection, requiring stenting for coverage of the dissection flap.  This was with an Abbott Absolute Pro 6 x 40 stent.     Jorge Ny, MD     VWB/MEDQ  D:  08/07/2010  T:  08/08/2010  Job:  [id number]  Electronically Signed by Arelia Longest IV MD on 09/17/2010 10:53:02 PM

## 2010-09-18 ENCOUNTER — Ambulatory Visit (INDEPENDENT_AMBULATORY_CARE_PROVIDER_SITE_OTHER): Payer: PRIVATE HEALTH INSURANCE | Admitting: Internal Medicine

## 2010-09-18 ENCOUNTER — Encounter: Payer: Self-pay | Admitting: Internal Medicine

## 2010-09-18 DIAGNOSIS — E785 Hyperlipidemia, unspecified: Secondary | ICD-10-CM

## 2010-09-18 DIAGNOSIS — I10 Essential (primary) hypertension: Secondary | ICD-10-CM

## 2010-09-18 DIAGNOSIS — R972 Elevated prostate specific antigen [PSA]: Secondary | ICD-10-CM

## 2010-09-18 DIAGNOSIS — E119 Type 2 diabetes mellitus without complications: Secondary | ICD-10-CM

## 2010-09-18 DIAGNOSIS — Z0000 Encounter for general adult medical examination without abnormal findings: Secondary | ICD-10-CM

## 2010-09-18 DIAGNOSIS — H9190 Unspecified hearing loss, unspecified ear: Secondary | ICD-10-CM | POA: Insufficient documentation

## 2010-09-18 MED ORDER — ATORVASTATIN CALCIUM 40 MG PO TABS: 40.0000 mg | ORAL_TABLET | Freq: Every day | ORAL | Status: DC

## 2010-09-18 MED ORDER — GEMFIBROZIL 600 MG PO TABS: 600.0000 mg | ORAL_TABLET | Freq: Two times a day (BID) | ORAL | Status: DC

## 2010-09-18 MED ORDER — RANITIDINE HCL 300 MG PO CAPS: 300.0000 mg | ORAL_CAPSULE | Freq: Every day | ORAL | Status: DC

## 2010-09-18 MED ORDER — OLMESARTAN MEDOXOMIL-HCTZ 40-25 MG PO TABS: 1.0000 | ORAL_TABLET | Freq: Every day | ORAL | Status: DC

## 2010-09-18 MED ORDER — FOLIC ACID 1 MG PO TABS: 1.0000 mg | ORAL_TABLET | Freq: Every day | ORAL | Status: DC

## 2010-09-18 NOTE — Assessment & Plan Note (Signed)
OFFICE VISIT   TILAK, OAKLEY B  DOB:  Mar 31, 1951                                       03/27/2009  VWUJW#:[id number]   The patient comes back today for a wound check.  He has been in [employer].  I think his wounds are healing much better.  I plan on seeing him  back in 3 weeks.   Jorge Ny, MD  Electronically Signed   VWB/MEDQ  D:  03/27/2009  T:  03/28/2009  Job:  [id number]

## 2010-09-18 NOTE — Discharge Summary (Signed)
NAMEERVING, SASSANO               ACCOUNT NO.:  [id number]   MEDICAL RECORD NO.:  [id number]          PATIENT TYPE:  OBV   LOCATION:  2922                         FACILITY:  [facility]   PHYSICIAN:  Balinda Quails, M.D.    DATE OF BIRTH:  10-Apr-1951   DATE OF ADMISSION:  12/06/2008  DATE OF DISCHARGE:  12/07/2008                               DISCHARGE SUMMARY   FINAL DISCHARGE DIAGNOSES:  1. Right popliteal aneurysm.  2. Diabetes mellitus, non-insulin dependent.  3. Hypertension.  4. Dyslipidemia.   PROCEDURES PERFORMED:  Endovascular repair of right popliteal aneurysm  by Dr. Devona Konig.   COMPLICATIONS:  None.   CONDITION ON DISCHARGE:  Stable, improving.   DISCHARGE MEDICATIONS:  1. Benicar hydrochlorothiazide 40/25 p.o. daily.  2. Coumadin 5 mg p.o. daily.  3. Folic acid 1 mg p.o. daily.  4. Gemfibrozil 600 mg p.o. b.i.d.  5. Lipitor 40 mg p.o. daily.  6. Ranitidine 300 mg p.o. daily.  7. Multivitamin p.o. daily.  8. Fish oil 1200 mg p.o. b.i.d.  9. Colace 100 mg p.o. b.i.d.  10.Aspirin 325 mg p.o. daily.  11.Metformin p.o. daily which he is to resume on Friday, December 09, 2008.   DISPOSITION:  He is being discharged home in stable condition.  He is  scheduled to see Dr. Myra Gianotti with a return visit in 4 weeks with ABIs.   BRIEF IDENTIFYING STATEMENT:  For complete details, please refer the  typed history and physical.  Briefly, this 60 year old gentleman was  evaluated by Dr. Madilyn Fireman for a right popliteal aneurysm.  He was found to  be a suitable candidate for endovascular repair.  Dr. Madilyn Fireman informed him  about the risks and benefits of the procedure and after careful  consideration, he elected proceed with surgery.   HOSPITAL COURSE:  Preoperative workup was completed as an outpatient.  He was brought in through Same-Day Surgery.  He was taken to the  Vascular Lab and underwent endovascular repair of his right popliteal  aneurysm.  For complete details,  please refer the typed operative  report.  The procedure was without complications.  He was  returned to the Postanesthesia Care Unit.  Following stabilization, his  sheath was removed.  He was transferred to a bed in a Cardiac Step-Down  Unit.  He was observed overnight.  The following day, he was found to be  in stable condition.  He had no hematoma in his groin.  He was desirous  of discharge and was subsequently discharged home.      Wilmon Arms, PA      P. Liliane Bade, M.D.  Electronically Signed    KEL/MEDQ  D:  12/07/2008  T:  12/07/2008  Job:  [id number]

## 2010-09-18 NOTE — Procedures (Signed)
CAROTID DUPLEX EXAM   INDICATION:  History of CVA.   HISTORY:  Diabetes:  Yes  Cardiac:  No  Hypertension:  Yes  Smoking:  Previous  Previous Surgery:  No carotid surgery  CV History:  CVA in 2003  Amaurosis Fugax No, Paresthesias No, Hemiparesis No                                       RIGHT             LEFT  Brachial systolic pressure:         110               110  Brachial Doppler waveforms:         normal            normal  Vertebral direction of flow:        antegrade         antegrade  DUPLEX VELOCITIES (cm/sec)  CCA peak systolic                   76                51  ECA peak systolic                   120               106  ICA peak systolic                   96                occluded  ICA end diastolic                   38  PLAQUE MORPHOLOGY:                  mixed             mixed  PLAQUE AMOUNT:                      mild              occlusive  PLAQUE LOCATION:                    ICA/ECA           ICA/ECA   IMPRESSION:  1. A 1- 39% stenosis of the right internal carotid artery.  2. The left internal carotid artery appears to be totally occluded.    ___________________________________________  V. Charlena Cross, MD   CH/MEDQ  D:  03/13/2009  T:  03/14/2009  Job:  [id number]

## 2010-09-18 NOTE — Assessment & Plan Note (Signed)
OFFICE VISIT   PARLEY, PIDCOCK B  DOB:  12-Oct-1950                                       07/10/2009  ZOXWR#:[id number]   Patient underwent laser ablation of the left lateral accessory branch of  the great saphenous vein plus laser ablation of the left great saphenous  vein which remained from the distal thigh to the proximal thigh.  Attempt was made after cannulating the great saphenous vein in the  distal thigh to pass the guidewire up to the saphenofemoral junction,  but this would not traverse the communication between the lateral  accessory branch in the proximal thigh; therefore, a second sheath was  utilized to cannulate the lateral accessory branch in the proximal third  of the thigh up to the saphenofemoral junction.  There were 2 separate  laser treatments performed:  Proximal lateral accessory branch with 870  joules and the second laser treatment in the distal great saphenous vein  in the distal thigh at 450 joules.  He also had greater than 20 stab  phlebectomies.  He tolerated the procedure well.  Return in 1 week for a  venous duplex exam.     Quita Skye. Hart Rochester, M.D.  Electronically Signed   JDL/MEDQ  D:  07/10/2009  T:  07/11/2009  Job:  [id number]

## 2010-09-18 NOTE — Consult Note (Signed)
VASCULAR SURGERY CONSULTATION   [name], [name]  DOB:  1951-04-15                                       10/06/2008  ZOXWR#:[id number]   REFERRING PHYSICIAN:  Corwin Levins, MD.   REFERRAL DIAGNOSIS:  Bilateral popliteal aneurysms.   HISTORY:  The patient is a 60 year old gentleman currently being treated  for varicose vein disease by Dr. Molli Hazard at the [facility].  During recent duplex evaluation of his varicose veins he was  noted to have bilateral popliteal aneurysms.  He was therefore referred  for further management.   The patient has a positive family history of aneurysmal disease, his  mother had a thoracic aortic aneurysm treated and survived this  operative procedure and is alive today.  Risk factors for aneurysm  disease include hypertension and hyperlipidemia along with his family  history.  The patient denies any lower extremity pain.   PAST MEDICAL HISTORY:  1. Hypertension.  2. Hyperlipidemia.  3. CVA.   MEDICATIONS:  1. Benicar/HCT 40/25 daily.  2. Coumadin 5 mg daily.  3. Folic acid 1 mg daily.  4. Gemfibrozil 600 mg 2 tablets daily.  5. Lipitor 40 mg daily.  6. Ranitidine 300 mg daily.  7. Aspirin 81 mg daily.  8. Metformin 500 mg 3 tablets daily.  9. Multivitamin 1 tablet daily.  10.Fish oil 1 tablet daily.   ALLERGIES:  None known.   SOCIAL HISTORY:  The patient is married with one child.  He is retired  from Regulatory affairs officer.  Does not currently use tobacco products.  Discontinued smoking in 2003.  Will have several glasses of wine a week.   FAMILY HISTORY:  Mother is living age 58 with a history of thoracic  aortic aneurysm repair.  Father is living age 77 generally well.  One  brother deceased age 29 of lung CA.  Another brother living age 71  generally well.   REVIEW OF SYSTEMS:  Refer to patient encounter form.  The patient denies  any major symptoms related to 12 system review.   PHYSICAL EXAM:   General:  A moderately obese 60 year old male.  Alert  and oriented.  No distress.  Vital signs:  BP 117/73, pulse 64 per  minute, respirations 18 per minute.  HEENT:  Mouth and throat are clear.  Normocephalic.  Extraocular movements intact.  Neck:  Supple.  No  thyromegaly or adenopathy.  Chest:  Is clear with equal air entry  bilaterally.  No rales or rhonchi.  Cardiovascular:  Normal heart sounds  without murmurs.  No gallops or rubs.  Regular rate and rhythm.  No  carotid bruits.  Abdomen:  Soft, nontender.  No masses or organomegaly.  Moderate obesity.  Lower extremities:  Reveal intact femoral pulses  bilaterally.  Wide popliteal pulses.  Dorsalis pedis and posterior  tibial pulses are intact.  No ankle edema.  Neurological:  Cranial  nerves intact.  Strength equal bilaterally.  Skin:  Intact without rash  or ulceration.   INVESTIGATIONS:  Lower extremity duplex evaluation reveals bilateral  popliteal aneurysms.  Measures 2.4 cm in the right popliteal artery, 2.2  cm in the left popliteal artery.  ABIs are 0.99 on the right, 0.91 on  the left.   IMPRESSION:  1. Bilateral popliteal aneurysms, associated complicating features.  2. Type 2 diabetes.  3.  Hypertension.  4. Hyperlipidemia.  5. Cerebrovascular accident.  6. Varicose vein disease.   RECOMMENDATIONS:  The patient will undergo CT angiogram of the abdomen  and pelvis and lower extremities.  This will allow evaluation of the  abdominal aorta to rule out aortic aneurysm which is frequently  associated with popliteal aneurysm disease.  I also question whether he  should remain on Coumadin with his history of stroke dating back to  2003.  This has not been reevaluated and consideration with referral to  Dr. Pearlean Brownie for further evaluation and possible change in medications if  indicated.  Will plan followup once CT angiogram performed.   Balinda Quails, M.D.  Electronically Signed  PGH/MEDQ  D:  10/13/2008  T:  10/14/2008   Job:  [id number]   cc:   Consuello Closs, M.D.  Corwin Levins, MD

## 2010-09-18 NOTE — Assessment & Plan Note (Signed)
OFFICE VISIT   JAXSIN, BOTTOMLEY B  DOB:  [birth year]                                       09/05/2009  MWNUU#:[id number]   The patient returns today for final followup regarding his treatment for  severe venous disease in the left leg.  He has had laser ablation of the  lateral accessory branch of his great saphenous as well as his distal  great saphenous in the thigh plus over 40 stab phlebectomies in 2  separate sessions over the last 2 months.  He has done very well with  good result as far as elimination of the pain from the bulging  varicosities and the appearance of the leg as well.  He has had no  distal edema and has now discontinued his long-leg elastic compression  stockings.   On examination today, his blood pressure 127/85, heart rate 64,  temperature 97.8.  Left leg stab phlebectomy sites have all healed  nicely with no distal edema.  He does have a 3+ popliteal graft pulse  and a well-perfused left foot.   Generally he has done well from his venous treatment.  He will return in  3 months with a duplex scan of his left femoral-popliteal graft to be  followed by Dr. Myra Gianotti as well as to check the stent in his right  superficial femoral artery for aneurysmal disease, which was performed  by Dr. Madilyn Fireman.  He has also had abdominal aortic aneurysm surgery as  well.  Return in 3 months.     Quita Skye Hart Rochester, M.D.  Electronically Signed   JDL/MEDQ  D:  09/05/2009  T:  09/06/2009  Job:  [id number]

## 2010-09-18 NOTE — Assessment & Plan Note (Signed)
OFFICE VISIT   David [name], David [name]  DOB:  Sep 12, 1950                                       06/05/2009  ZOXWR#:[id number]   HISTORY:  The patient is a 60 year old gentleman who comes back in today  for followup.  He is status post endovascular repair of a right  popliteal aneurysm using a Gore Viabond endoprosthesis on December 06, 2008.  Shortly thereafter, he underwent open repair of infrarenal  abdominal aneurysm on December 13, 2008.  He then underwent open repair of  the left popliteal aneurysm.  This was a left above-knee to below-knee  popliteal bypass graft with ipsilateral greater saphenous vein harvested  from the knee to the ankle on April 14, 2009.  He was slow to recover  from his bypass secondary to a wound separation from the vein harvest  site.  This was secondary to venous hypertension in his leg.  He was  required to wear an Unna boot for several weeks to help with healing.  He did have prominent edema in his leg as well as pain.  The patient has  a history of significant reflux.  He is status post endovenous ablation  by Dr. Sondra Come secondary to reflux in both legs.  His ablations from Dr.  Sondra Come were approximately 1 year ago.  At that time, he was fitted for  thigh-high 20-30 mm compression stockings.  The patient has been wearing  compression stockings since that time.   With the Unna boots and leg elevation, the patient has been able to  finally heal his open wounds from his vein harvest site.  He still has  prominent painful varicosities in his lower leg.  I brought him back in  to evaluate him for reflux again, as we noted a patent saphenous branch  that could be contributing to his venous hypertension.   Venous ultrasound was performed today which shows a lateral branch in  his left greater saphenous vein which is patent and does not have  significant reflux.  The patient also has multiple varicosities in his  lower leg.   The patient was  seen and evaluated by Dr. Hart Rochester today.  He confirmed  that the lateral patent saphenous branch communicates with the  varicosities.  He will be evaluated for endovenous ablation and stab  phlebectomy in the immediate future.     Jorge Ny, MD  Electronically Signed   VWB/MEDQ  D:  06/05/2009  T:  06/06/2009  Job:  [id number]   cc:   Quita Skye. Hart Rochester, M.D.

## 2010-09-18 NOTE — Assessment & Plan Note (Signed)
OFFICE VISIT   David [name], David [name]  DOB:  Jun 05, 1950                                       04/24/2009  ZOXWR#:[id number]   REASON FOR VISIT:  Follow-up.   HISTORY:  This is 60 year old gentleman with a complex past history.  Initially, he underwent endovascular repair of a right popliteal  aneurysm using a Gore Viabahn device on 12/06/08.  Shortly thereafter,  he underwent open repair of an infrarenal abdominal aortic aneurysm on  12/13/08.  Once he recovered from that, he then underwent open repair of  a left popliteal aneurysm with a left above-knee to below-knee popliteal  bypass graft with ipsilateral reversed greater saphenous vein on  04/14/09.  His postoperative course from his fem-pop bypass graft has  been complicated by wound separation from his vein harvest site.  This  is due to the venous hypertension in his left leg.  He has undergone  endovenous ablation; however, still has prominent varicosities in the  left leg below the knee and presumably has significant reflux in the  left leg.  For the past month, he has been in an Radio broadcast assistant to help with  wound healing.  He comes back in today for follow-up.   Incisions have made significant progress.  There is eschar over the  incisions.  They are nearly healed.   At this point, I think we should continue with Unna boot.  These will  continue to be changed over 3 days.  I am going to see him back in 6  weeks.  I think that ultimately, he will require a repeat evaluation of  the reflux in his left leg.  He certainly is going to need to have  removal of the prominent varicosities so this will prevent long-term  ulceration.  When he comes back, I am going to get a venous ultrasound  to evaluate for reflux.  I told him today that Dr. Jerilee Field would be  involved with tackling his reflux.  Overall, I think he will require  least 6 more months for healing.  Again, I will see him back in 6 weeks.     Jorge Ny, MD  Electronically Signed   VWB/MEDQ  D:  04/24/2009  T:  04/25/2009  Job:  [id number]

## 2010-09-18 NOTE — H&P (Signed)
NAMEJAMAREON, David [name]               ACCOUNT NO.:  [id number]   MEDICAL RECORD NO.:  [id number]          PATIENT TYPE:  INP   LOCATION:  2399                         FACILITY:  [facility]   PHYSICIAN:  Balinda Quails, M.D.    DATE OF BIRTH:  04-11-1951   DATE OF ADMISSION:  12/13/2008  DATE OF DISCHARGE:                              HISTORY & PHYSICAL   ADMITTING PHYSICIAN:  Balinda Quails, MD   PRIMARY CARE PHYSICIAN:  Corwin Levins, MD   ADMISSION DIAGNOSIS:  A 5.2-cm abdominal aortic aneurysm.   HISTORY:  David [name] is a 60 year old gentleman who was initially  found to have bilateral popliteal aneurysms during workup for varicose  vein disease at the [facility].  He was referred for further  management of his popliteal aneurysms.   Workup including a CT scan revealed a 5.2-cm abdominal aortic aneurysm,  this also verified bilateral popliteal aneurysms.  Last week, he  underwent endovascular repair of his right popliteal aneurysm  replacement of a Viabahn stent graft.   He is admitted to hospital at this time for elective repair of his  abdominal aortic aneurysm.   PAST MEDICAL HISTORY:  1. Hypertension.  2. Hyperlipidemia.  3. CVA.  4. Type 2 diabetes.   MEDICATIONS:  1. Benicar/hydrochlorothiazide 40/25 p.o. daily.  2. Coumadin 5 mg p.o. daily.  3. Folic acid 1 mg p.o. daily.  4. Gemfibrozil 600 mg 2 tablets daily.  5. Lipitor 40 mg p.o. daily.  6. Ranitidine 300 mg p.o. daily.  7. Aspirin 81 mg p.o. daily.  8. Metformin 500 mg t.i.d.  9. Multivitamin 1 tablet daily.  10.Fish oil 1 tablet daily.   ALLERGIES:  None known.   SOCIAL HISTORY:  The patient is married with 1 child.  He is retired  from [employer] affairs [employer].  He does not currently use tobacco products.  Discontinued using tobacco in 2003.  He has several glasses of wine per  week.   FAMILY HISTORY:  Mother is living, age 63 with a history of thoracic  aneurysm repair.  Father is living, age 32,  generally well.  One brother  deceased, age 25 of lung CA.  Another brother living, age 13, generally  well.   REVIEW OF SYSTEMS:  The patient denies any major symptoms related to 12-  system review.   PHYSICAL EXAMINATION:  GENERAL:  Moderately obese 60 year old male.  Alert and oriented.  No distress.  VITAL SIGNS:  BP 117/73, pulse 80 per minute, respirations 17 per  minute.  HEENT:  Mouth and throat are clear.  Normocephalic.  Extraocular  movements intact.  NECK:  Supple.  No thyromegaly or adenopathy.  CHEST:  Clear with equal entry bilaterally.  No rales or rhonchi.  CARDIOVASCULAR:  Normal heart sounds without murmurs.  No gallops or  rubs.  Regular rate and rhythm.  No carotid bruits.  ABDOMEN:  Soft, nontender.  No masses or organomegaly.  Moderate  obesity.  No tenderness.  EXTREMITIES:  Lower extremities, intact femoral pulses bilaterally.  Wide popliteal pulses.  Dorsalis pedis and posterior tibial pulses  present bilaterally.  No ankle edema.  NEUROLOGIC:  Cranial nerves intact.  Strength equal bilaterally.  1+  reflexes.  SKIN:  Intact without rash or ulceration.   IMPRESSION:  1. A 5.2-cm abdominal aortic aneurysm.  2. 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  3. Type 2 diabetes.  4. Hypertension.  5. Hyperlipidemia.  6. Cerebrovascular accident.  7. Varicose vein disease.   ADMISSION PLAN:  The patient will be admitted for elective open repair  of abdominal aortic aneurysm.      Balinda Quails, M.D.  Electronically Signed     PGH/MEDQ  D:  12/13/2008  T:  12/13/2008  Job:  [id number]   cc:   Corwin Levins, MD

## 2010-09-18 NOTE — Assessment & Plan Note (Signed)
OFFICE VISIT   DMAURI, ROSENOW B  DOB:  01-31-1951                                       02/27/2009  EAVWU#:[id number]   The patient comes back in today after having undergone a left popliteal  aneurysm repair.  This was done using ipsilateral saphenous vein.  He  saw Dr. Edilia Bo on 10/21 for a wound separation.  He has been keeping  his leg elevated more and it keeping clean with Dial soap.   On examination, the above-knee incision is healed.  The below-knee 3  incisions which correlate to the below-knee anastomosis as well as the  vein harvest site show signs of skin separation.  There is a mild amount  of erythema around the incisions.  There is no drainage.  There is no  foul odor.  The leg is still edematous.   I think the patient's venous insufficiency is contributing towards his  edema, which has subsequently affected wound healing.  I have continued  to stress leg elevation.  I am putting him in an Ace wrap today.  I am  also going to give him a week's worth of antibiotics.  He is going to  come back to see me in 2 weeks.  He is also going to have a carotid  ultrasound at this time.   Jorge Ny, MD  Electronically Signed   VWB/MEDQ  D:  02/27/2009  T:  02/28/2009  Job:  [id number]

## 2010-09-18 NOTE — Assessment & Plan Note (Signed)
OFFICE VISIT   David [name], David [name]  DOB:  08-10-1950                                       01/30/2009  [id number]#:[id number]   REASON FOR VISIT:  Followup.   HISTORY:  This is a 60 year old gentleman who has previously undergone  open abdominal aortic aneurysm repair by Dr. Madilyn Fireman.  He has also  undergone stenting of a right popliteal aneurysm.  He comes back today  for discussion on open repair of his left popliteal aneurysm.  He has  recovered well from his previous procedures.   PAST MEDICAL HISTORY:  Hypertension, hyperlipidemia, bilateral popliteal  aneurysms, abdominal aneurysm repair.   SOCIAL HISTORY:  He is married with one child.  No tobacco, quit in  2003.  Occasional alcohol.   FAMILY HISTORY:  Please see Dr. Madilyn Fireman' note on October 13, 2008.   REVIEW OF SYSTEM:  No fevers, chills, no nausea, no vomiting.  GI:  Positive for constipation.  GU:  Negative.  VASCULAR:  Negative.  NEURO:  Negative.   PHYSICAL EXAMINATION:  Blood pressure 126/85, pulse 70.  He is well-  appearing, in no distress.  His abdominal incision is well-healed.  There is no evidence of hernia.  Both feet are warm and well-perfused.  He has common varicosities bilaterally.   ASSESSMENT:  Left popliteal aneurysm.   PLAN:  As we have discussed previously, due to location of the aneurysm  being the right behind the knee, I think he is best treated with an open  procedure.  He was vein mapped today.  I think that we can use his  greater saphenous vein below the knee.  He has a history of bilateral  laser ablations by Dr. Sondra Come.  His right greater saphenous vein is  patent.  However, I am concerned that it has been ablated and may not be  a healthy vein.  Therefore, I will not use it. In my opinion, the only  available veins to use are the greater saphenous vein below the knee on  the left and potentially an anterior branch of the greater saphenous  vein in the thigh on the left.  I  told him that if I do not feel that  his veins were adequate, we would use Gore-Tex for the procedure.  Again, this is scheduled for Tuesday, February 14, 2009.  He is going to  stop his Plavix 5 days prior.   Jorge Ny, MD  Electronically Signed   VWB/MEDQ  D:  01/30/2009  T:  01/31/2009  Job:  [id number]   cc:   Corwin Levins, MD

## 2010-09-18 NOTE — Assessment & Plan Note (Signed)
OFFICE VISIT   David [name], David [name]  DOB:  [birth year]                                       03/13/2009  XBMWU#:[id number]   The patient comes back today having undergone endovascular repair of a  right popliteal aneurysm, open repair of an aortic aneurysm and then  open repair of a left popliteal aneurysm.  He has had some dehiscence in  his vein harvest sites on his left leg, secondary to the swelling as he  has had a history of chronic venous insufficiency.  I put him on  antibiotics last week and he comes back in today for follow-up.  Today  there is no evidence of infection and I did debride extensive amount of  the lowest vein harvest incision down to nearly healthy tissue.  There  was some drainage here.  There was no odor.  There is minimal erythema.  I have elected to place him into an [place] boot as the swelling is the  etiology for this not healing.  He is going to have home health arranged  and I will see him back in 2 weeks.   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  We  will continue with surveillance ultrasound for this.   Jorge Ny, MD  Electronically Signed   VWB/MEDQ  D:  03/13/2009  T:  03/14/2009  Job:  [id number]

## 2010-09-18 NOTE — Op Note (Signed)
[name], [name]               ACCOUNT NO.:  [id number]   MEDICAL RECORD NO.:  [id number]          PATIENT TYPE:  OBV   LOCATION:  2807                         FACILITY:  [facility]   PHYSICIAN:  Balinda Quails, M.D.    DATE OF BIRTH:  26-Jun-1950   DATE OF PROCEDURE:  12/06/2008  DATE OF DISCHARGE:                               OPERATIVE REPORT   PHYSICIAN:  1. Balinda Quails, M.D.  2. Jorge Ny, M.D.   DIAGNOSIS:  Right popliteal aneurysm.   PROCEDURE:  1. Right lower extremity arteriogram.  2. Right popliteal Viabahn stent.  3. Right superficial femoral artery Viabahn stent.   ACCESS:  Antegrade right common femoral artery 11-French sheath.   CONTRAST:  80 mL Visipaque.   COMPLICATIONS:  None apparent.   CLINICAL NOTE:  David [name] is a 60 year old gentleman with bilateral  popliteal aneurysms and an abdominal aortic aneurysm.  Workup for this  revealed a right popliteal aneurysm amendable to stenting with a covered  Viabahn stent.  He was brought to catheterization lab at this time for  such procedure.   PROCEDURE NOTE:  The patient was brought to catheterization lab in  stable condition.  Placed in supine position.  Ultrasound of the right  groin carried out.  This revealed the common femoral artery to be  patent.  Ultrasound guidance was used to access the right common femoral  artery with a micropuncture needle.  The patient was anesthetized with  1% Xylocaine and did receive Versed and fentanyl during the procedure  intravenously.   A micropuncture catheter accessed into the right common femoral artery.  This was then exchanged for a short 6-French sheath.  An Amplatz Super  Stiff guidewire advanced down in the right superficial femoral artery.  A short 11-French sheath was then advanced over the guidewire.  The  dilator removed.  Working over the [employer] guidewire, right  lower extremity arteriogram was obtained.  The patient ministered  5000  units of heparin intravenously.   The right popliteal artery aneurysm was easily identified.  Using Glow  and Tell tape, the extent of the aneurysm was measured.   Initial stent was placed distally, a 10 x 15 Viabahn extending from the  mid level of the patella proximally from the popliteal artery into the  right superficial femoral artery.  An extension of the stent was then  carried out up into the superficial femoral artery with a 5 x 10 Viabahn  stent.  Post dilatation was carried out with an 8 x 10 Powerflex  balloon.  This was so carried out throughout the length of the stent.   The patient did receive 5000 units of heparin prior to the procedure.   Completion arteriogram was excellent without evidence of distal  embolization.  Palpable right posterior tibial pulse.   The guidewires and catheters removed.  Right femoral sheath will be  removed following ACT measurement.  No apparent complications.   FINAL IMPRESSION:  1. Right popliteal artery aneurysm.  2. Successful Viabahn stenting right popliteal artery aneurysm without  apparent complication.      Balinda Quails, M.D.  Electronically Signed     PGH/MEDQ  D:  12/06/2008  T:  12/06/2008  Job:  [id number]   cc:   Jorge Ny, MD

## 2010-09-18 NOTE — Assessment & Plan Note (Signed)
stable overall by hx and exam, most recent lab reviewed with pt, and pt to continue medical treatment as before  Lab Results  Component Value Date   LDLCALC 51 09/13/2010

## 2010-09-18 NOTE — Assessment & Plan Note (Signed)
OFFICE VISIT   [name], [name]  DOB:  02-15-1951                                       12/26/2008  [id number]#:[id number]   Mr. Burby comes back in today for staple removal.  He has undergone  abdominal aortic aneurysm repair by Dr. Madilyn Fireman.  He has also undergone  stenting of a right popliteal aneurysm.  His wound has nicely healed.  Staples have been removed.  He does have a left popliteal aneurysm that  needs to be repaired.  However, he is not a good endovascular candidate  and will likely require bypass.  Therefore, we are going to let him  recover from his abdominal surgery prior to proceeding.  He had been on  Coumadin which was before his stroke which was started by [facility].  This was not started after his operation.  However, we will  need confirmation from Dr. Pearlean Brownie as to whether not this is an  appropriate course of action.  I did start him on Plavix today.  I will  have him come back to see me in 1 month of vein mapping and we discuss  timing and plans for his left popliteal aneurysm repair.   Jorge Ny, MD  Electronically Signed   VWB/MEDQ  D:  12/26/2008  T:  12/27/2008  Job:  [id number]

## 2010-09-18 NOTE — Patient Instructions (Signed)
Continue all other medications as before Please return in 6 mo with Lab testing done 3-5 days before  

## 2010-09-18 NOTE — Assessment & Plan Note (Signed)
OFFICE VISIT   David [name], David [name]  DOB:  1951/03/14                                       02/23/2009  [id number]#:[id number]   I saw the patient in the office today to check on his incision in his  left leg.  He underwent repair of a left popliteal artery aneurysm by  Dr. Myra Gianotti on 02/14/2009.  He had ligation of popliteal artery and left  above knee to below knee pop bypass with a vein graft.  He came in today  to have his wound checked as he had some slight separation at the  superior aspect of the incision of his below the knee incision.   PHYSICAL EXAMINATION:  On examination he has some mild separation at the  superior aspect of his incision on the left leg at the below the knee  incision.  This is over a length of approximately a centimeter and a  half.  The remainder of the incision looked intact and I applied some  Steri-Strips to prevent this from separating further.   We discussed the importance of leg elevation to help with his leg  swelling which I think is contributing to the problem with his incision.  He had no significant drainage or erythema to suggest infection.  I have  instructed him to keep his incisions clean and dry with soap and water.  I think his bypass graft is functioning.  He has a biphasic posterior  tibial signal with the Doppler.  He will follow up with Dr. Myra Gianotti on  Monday to have his wound checked.   Di Kindle. Edilia Bo, M.D.  Electronically Signed   CSD/MEDQ  D:  02/23/2009  T:  02/24/2009  Job:  [id number]

## 2010-09-18 NOTE — Assessment & Plan Note (Signed)
At least moderate, no PE evidence for problem today , pt talking very loudly and I think does not realize how much hearing he has lost, and declines ENT/audiology at this time

## 2010-09-18 NOTE — Procedures (Signed)
LOWER EXTREMITY ARTERIAL DUPLEX   INDICATION:  Left popliteal artery aneurysm per varicose vein duplex.   HISTORY:  Diabetes:  Borderline.  Cardiac:  No.  Hypertension:  Yes.  Smoking:  Quit in 2003.  Previous Surgery:  Varicose vein treatments.   SINGLE LEVEL ARTERIAL EXAM                          RIGHT                LEFT  Brachial:               138                  128  Anterior tibial:        110                  100  Posterior tibial:       136                  126  Peroneal:  Ankle/Brachial Index:   0.99                 0.91   LOWER EXTREMITY ARTERIAL DUPLEX EXAM   DUPLEX:  Duplex reveals diffuse calcified plaque bilaterally throughout  the lower extremity arteries with no focal stenosis.  Right popliteal artery aneurysm measures 2.4 cm x 2.3 cm (distal  superficial femoral artery).  Left popliteal artery measures 2.2 cm x 2.1 cm (distal popliteal  artery).   IMPRESSION:  1. Right ABI suggests no significant arterial occlusive disease.  2. Left ABI suggests mild arterial occlusive disease.  3. Bilateral popliteal artery aneurysms.       ___________________________________________  P. Liliane Bade, M.D.   MC/MEDQ  D:  10/06/2008  T:  10/06/2008  Job:  [id number]

## 2010-09-18 NOTE — Procedures (Signed)
VASCULAR LAB EXAM   INDICATION:  Preoperative greater saphenous vein and short saphenous  vein mapping for possible bypass graft.  Prior bilateral greater  saphenous vein procedures approximately 1 year ago.   HISTORY:  Diabetes:  Borderline  Cardiac:  No  Hypertension:  Yes.   EXAM:  Bilateral greater saphenous vein and short saphenous vein  mapping.   IMPRESSION:  Patent bilateral greater saphenous veins in areas with  chronic thrombus in others.  Patent bilateral short saphenous veins.  See attached drawing for details.         ___________________________________________  V. Charlena Cross, MD   AS/MEDQ  D:  01/30/2009  T:  01/30/2009  Job:  [id number]

## 2010-09-18 NOTE — Procedures (Signed)
DUPLEX DEEP VENOUS EXAM - LOWER EXTREMITY   INDICATION:  Post left great saphenous vein and anterior accessory  saphenous vein, 1 week post laser.   HISTORY:  Edema:  Yes.  Trauma/Surgery:  EVLA.  Pain:  Yes.  PE:  No.  Previous DVT:  No.  Anticoagulants:  No.  Other:   DUPLEX EXAM:                CFV   SFV   PopV  PTV    GSV                R  L  R  L  R  L  R   L  R  L  Thrombosis    o  o     o     o      o     +  Spontaneous   +  +     +     +      +     o  Phasic        +  +     +     +      +     o  Augmentation  +  +     +     +      +     o  Compressible  +  +     +     +      +     o  Competent     +  +     +     +      +     o   Legend:  + - yes  o - no  p - partial  D - decreased   IMPRESSION:  No evidence of deep venous thrombosis identified.  Left  great saphenous vein ablated from the proximal thigh to the proximal  calf.  Left anterior accessory saphenous vein ablated from the proximal  thigh to the proximal to mid thigh.    _____________________________  Quita Skye. Hart Rochester, M.D.   CJ/MEDQ  D:  07/18/2009  T:  07/18/2009  Job:  [id number]

## 2010-09-18 NOTE — Assessment & Plan Note (Signed)
stable overall by hx and exam, most recent lab reviewed with pt, and pt to continue medical treatment as before,ke  BP Readings from Last 3 Encounters:  09/18/10 102/70  08/03/10 153/91  03/21/10 132/80

## 2010-09-18 NOTE — Op Note (Signed)
[name], [name]               ACCOUNT NO.:  [id number]   MEDICAL RECORD NO.:  [id number]          PATIENT TYPE:  INP   LOCATION:  2314                         FACILITY:  [facility]   PHYSICIAN:  Balinda Quails, M.D.    DATE OF BIRTH:  1950/08/18   DATE OF PROCEDURE:  12/13/2008  DATE OF DISCHARGE:                               OPERATIVE REPORT   SURGEON:  Balinda Quails, MD   ASSISTANT:  Della Goo, PA-C   ANESTHESIA:  General endotracheal.   ANESTHESIOLOGIST:  Maren Beach, MD   PREOPERATIVE DIAGNOSIS:  A 5.2-cm infrarenal abdominal aortic aneurysm.   POSTOPERATIVE DIAGNOSIS:  A 5.2-cm infrarenal abdominal aortic aneurysm.   PROCEDURE:  Repair of infrarenal abdominal aortic aneurysm with 20-mm  Dacron tube graft.   CLINICAL NOTE:  [name] is a 60 year old male who was  initially referred with bilateral popliteal aneurysms.  Further workup  for this revealed a 5.2-cm infrarenal abdominal aortic aneurysm.  Brought to the operating at this time for open repair.  He was not felt  to be a candidate for a stent graft repair due to the presence of a  conical-type proximal neck.   OPERATIVE PROCEDURE:  The patient was brought to the operating room in  stable condition.  Placed under general endotracheal anesthesia.  Foley  catheter, arterial lines, and Swan-Ganz catheter in place.   In the supine position, the abdomen and both legs were prepped and  draped in a sterile fashion.   Midline skin incision made from xiphoid to umbilicus.  Dissection  carried through the subcutaneous tissue with electrocautery.  Linea alba  incised.  The peritoneal cavity entered.   Full laparotomy evaluation carried out.  Stomach and duodenum were  unremarkable.  Liver, gallbladder, and bile ducts unremarkable.  Pancreas normal.  Large bowel revealed no masses.  Small bowel normal.   The retroperitoneum verified presence of a juxtarenal 5.2-cm abdominal  aortic aneurysm.  The  aneurysm did taper at the level of the common  iliac arteries bilaterally.  Moderate plaque noted at the bifurcation  and in the common iliac arteries.   The transverse colon was brought superiorly and the small bowel brought  to the right.  The retroperitoneum incised along the duodenal border.  Duodenum reflected laterally.  The proximal neck of the aneurysm was  dissected out to the origin of the renal arteries which were clearly  identified bilaterally.  The left renal vein skeletonized and retracted.  This revealed a proximal neck which was adequate for infrarenal clamp  placement.  Distal dissection then carried down along the aneurysm sac  and the origin of the inferior mesenteric artery was freed and encircled  with a fine vessel loop.  Further dissection then carried down to the  aortic bifurcation.  The common iliac arteries bilaterally were  dissected out and encircled with vessel loops.   The patient was administered 25 grams of mannitol along with 8000 units  of heparin intravenously.   The infrarenal aorta controlled with an aortic DeBakey clamp.  The  common iliac arteries were controlled with  coarctation clamps.  The  aneurysm sac was opened longitudinally.  Laminated thrombus removed.  No  significant backbleeding in number of vessels.  The aorta opened up to  the neck.  A proximal anastomosis carried out with a 20-mm tube Dacron  graft using a running 3-0 Prolene suture.  On completion of the proximal  anastomosis, the graft was flushed and controlled with a Fogarty clamp.  The graft was then brought down to the aortic bifurcation where it was  divided and anastomosed end-to-end to the aortic bifurcation using  running 3-0 Prolene suture.  Graft was then flushed and the iliac  vessels were back-flushed.  Clamps were then removed.  Excellent flow  present with audible signals in the feet bilaterally.   Adequate hemostasis obtained.  Sponge and instrument counts were   correct.  The patient was administered 50 mg of protamine intravenously.   The aneurysm sac closed over the graft with a running 2-0 Vicryl suture.  The retroperitoneum closed with running 3-0 Vicryl suture.  The abdomen  was examined to assure there were no retained instruments or sponges.  The midline fascia closed with running #1 PDS suture.  Subcutaneous  tissue closed with interrupted 3-0 Vicryl suture.  Skin closed with  staples.  Sterile dressings applied.   There were no apparent complications.  The patient transferred to the  recovery room in stable condition.      Balinda Quails, M.D.  Electronically Signed     PGH/MEDQ  D:  12/13/2008  T:  12/13/2008  Job:  [id number]   cc:   Corwin Levins, MD

## 2010-09-18 NOTE — Assessment & Plan Note (Signed)
PSA this time on the borderline of being increased to the extent to be concerned about urology eval, declines rectal exam, will f/u with PSA next visit

## 2010-09-18 NOTE — Assessment & Plan Note (Signed)
OFFICE VISIT   David [name], ARVELO B  DOB:  January 24, 1951                                       10/13/2008  ZOXWR#:[id number]   The patient returned to the office today to review the results of the CT  angiogram I ordered on him.  This was carried out of the abdomen, pelvis  and bilateral lower extremities.   My suspicions have indeed proven to be true that he does have an  abdominal aortic aneurysm, this measures 5.2 cm.  There is quite marked  dilatation of the aortic neck and I do not feel he would be a good  candidate for placement of an endograft.   His lower extremities verify bilateral popliteal aneurysms as noted on  his duplex scans.  He does have intact runoff bilaterally.  Will require  operative repair of his AAA and his popliteal aneurysms.   BP is 132/87, pulse 73 per minute.  His lower extremities remain intact  without evidence of embolization or ischemic changes.   Will schedule him to undergo a stress Cardiolite evaluation with [facility] and a followup appointment to see me.   Balinda Quails, M.D.  Electronically Signed   PGH/MEDQ  D:  10/13/2008  T:  10/14/2008  Job:  [id number]   cc:   Corwin Levins, MD

## 2010-09-18 NOTE — Assessment & Plan Note (Signed)
stable overall by hx and exam, most recent lab reviewed with pt, and pt to continue medical treatment as before  Lab Results  Component Value Date   HGBA1C 5.7 09/13/2010

## 2010-09-18 NOTE — Assessment & Plan Note (Signed)
OFFICE VISIT   David [name], David [name]  DOB:  February 05, 1951                                       12/04/2009  GNFAO#:[id number]   The patient comes back today for follow-up of his aortic aneurysm and  bilateral popliteal aneurysms.  He is doing very well without  complaints.  He has undergone laser ablation of the lateral branch to  his saphenous on the left as well as 40 stab phlebectomies by Dr.  Hart Rochester.  He does not have any swelling in his legs at this time.  His  incision have all healed.  He denies symptoms of claudication.  He  denies abdominal pain.   PHYSICAL EXAMINATION:  Heart rate 67, blood pressure is 129/61,  respirations 20.  General:  He is well-appearing, in no distress.  Cardiovascular:  Regular rate and rhythm.  Abdomen is soft and his  incision is well-healed.  No hernias.  Extremities are warm and well-  perfused.  No edema.  I can palpate a right posterior tibial pulse.  I  cannot palpate a left.  Skin is without rash.  No edema.   DIAGNOSTIC STUDIES:  I have ordered and independently reviewed his  ultrasound.  The ABI on the right is stable at 1.0.  On the left it is  decreased slightly from preoperative evaluation.  It measures 0.77  today, down from 0.91.  There were no increased velocities within the  bypass.  The wave forms change from triphasic to monophasic in the  native superficial femoral artery.   ASSESSMENT/PLAN:  Status post aortic aneurysm repair and bilateral  popliteal aneurysm repair, the right with a covered Viabahn prosthesis  and the left with an above to below-knee popliteal bypass.   PLAN:  The ABI on the left has decreased slightly, but there is no  evidence of stenosis within his bypass.  We will reevaluate this with  ultrasound in 6 months and consider arteriogram if he has developed  symptoms or his numbers look worse.  We will continue his regular  surveillance of his aortic aneurysm and bilateral popliteal  aneurysms.     Jorge Ny, MD  Electronically Signed   VWB/MEDQ  D:  12/04/2009  T:  12/05/2009  Job:  [id number]

## 2010-09-18 NOTE — Assessment & Plan Note (Signed)
OFFICE VISIT   David [name], David [name]  DOB:  December 01, 1950                                       07/18/2009  ZOXWR#:[id number]   The patient returns 1 week post 1) laser ablation of the left great  saphenous vein - lateral accessory branch in the proximal thigh as well  2) laser ablation of the left great saphenous vein in the distal thigh  through two separate sheaths as well as 20 stab phlebectomies.  He has  done very well with minimal discomfort in the groin and thigh area and  no pain associated with stab phlebectomy wounds.  He does have some  numbness in the left leg related to his popliteal aneurysm surgery done  in the past.  He has had no change in the distal edema and continues to  have bulging varicosities in the left calf.   On examination stab phlebectomy wounds have all healed nicely.  There is  minimal tenderness along the course of the ablated saphenous veins in  the two areas previously described.  Venous duplex exam reveals total  closure of the left great saphenous vein and the lateral accessory  branch down to the knee level with no evidence of DVT.  I reassured him  regarding these findings.  He does have an isolated bulging varicosity  in the contralateral right calf.  He has had previous treatment  performed at [facility] by Dr. Sondra Come.  Today venous duplex exam  reveals total closure of the right great saphenous vein with normal deep  system and no reflux in the small saphenous vein.  I have reassured him  regarding that side.  He will return in the next few weeks for his final  treatment which will be stab phlebectomies in the left posterior calf  area.     Quita Skye Hart Rochester, M.D.  Electronically Signed   JDL/MEDQ  D:  07/18/2009  T:  07/19/2009  Job:  [id number]

## 2010-09-18 NOTE — Op Note (Signed)
David [name], David [name]               ACCOUNT NO.:  [id number]   MEDICAL RECORD NO.:  [id number]          PATIENT TYPE:  AMB   LOCATION:  SDS                          FACILITY:  [facility]   PHYSICIAN:  Balinda Quails, M.D.    DATE OF BIRTH:  03/01/1951   DATE OF PROCEDURE:  11/14/2008  DATE OF DISCHARGE:  11/14/2008                               OPERATIVE REPORT   SURGEON:  Balinda Quails, MD.   DIAGNOSES:  1. Infrarenal abdominal aortic aneurysm.  2. Bilateral popliteal aneurysms.   PROCEDURE:  Abdominal aortogram with bilateral lower extremity runoff  arteriography.   ACCESS:  Right common femoral artery 5-French sheath.   CONTRAST:  190 mL of Visipaque.   COMPLICATIONS:  None apparent.   CLINICAL NOTE:  David [name] is a 59 year old gentleman who was  found to have bilateral popliteal aneurysms.  Workup for this also  revealed evidence of a 5.2-cm abdominal aortic aneurysm.   He was brought to the Cath Lab at this time for further diagnostic  workup with arteriography and possible plans for stenting of right  popliteal aneurysm.   PROCEDURE NOTE:  The patient was brought to the Cath Lab in stable  condition.  He was placed in the supine position.  Both groins were  prepped and draped in a sterile fashion.  He received 2 mg of Versed and  25 mcg of fentanyl intravenously.   Skin and subcutaneous tissue of right groin were instilled with 1%  Xylocaine.  An 18 gauge needle was introduced into the right common  femoral artery.  A 0.035 Versacore guidewire was advanced through the  needle into the mid abdominal aorta.  A 5-French sheath was advanced  over the guidewire.  A pigtail catheter was advanced over the guidewire  into the juxtarenal aorta.  Standard AP mid abdominal aortogram was  obtained.  This is revealed a single bilateral renal arteries.  A  juxtarenal abdominal aortic aneurysm was present.  The aneurysm was  tapered at the aortic bifurcation.  The common iliac  arteries were  patent bilaterally and normal in caliber.   A lateral aortogram was obtained.  This verified patency of the superior  mesenteric artery with brisk flow.   The pigtail catheter was then brought down to the aortic bifurcation and  bilateral lower extremity arteriography was obtained.  The common iliac,  external iliac, and hypogastric vessels were patent bilaterally.  Common  femoral artery was intact bilaterally.  Superficial femoral arteries  were patent bilaterally.  The profunda femoris artery was intact  bilaterally.   The right leg revealed a complex popliteal aneurysm arising at the level  of the adductor canal and extending down to the level of the patella.  The left leg revealed a focal popliteal aneurysm at the level of the  patella.  There was intact tibial runoff bilaterally.   Peak view images of the right leg were obtained with retrograde  injection through the right femoral sheath.  It did verify a complex  popliteal aneurysm which was primarily in the suprapatellar position  with tapering of  the vessel at the level of the patella.   This completed the arteriogram procedure.  The right femoral sheath was  removed.  No apparent complications.   FINAL IMPRESSION:  1. A 5.2-cm juxtarenal abdominal aortic aneurysm.  2. Bilateral patent renal arteries.  3. Complex right popliteal artery aneurysm in the suprapatellar      position.  4. Focal left popliteal aneurysm at the level of the patella.  5. Intact tibial vessel runoff bilaterally.   DISPOSITION:  These results will be reviewed further with family.  Plan  will be to perform operative repair of the juxtarenal abdominal aortic  aneurysm and stenting of the right popliteal aneurysm and open operative  repair of left popliteal aneurysm.      Balinda Quails, M.D.  Electronically Signed     PGH/MEDQ  D:  11/14/2008  T:  11/15/2008  Job:  [id number]   cc:   Corwin Levins, MD

## 2010-09-18 NOTE — Assessment & Plan Note (Signed)
OFFICE VISIT   David [name], David [name]  DOB:  [birth year]                                       06/05/2009  JYNWG#:[id number]   ADDENDUM:  This is Dr. Hart Rochester dictating an addendum to the note on David [name]. The patient's severe venous insufficiency of the left leg has  been worked up and evaluated by Dr. Myra Gianotti, and I did examine him today  in the office as well as performed a bedside ultrasound study with the  SonoSite.  I agree that he does have a large refluxing lateral accessory  branch of the great saphenous vein feeding these varicosities in the  calf posteriorly and anteriorly.  I think he should be treated by 1  laser ablation of this large lateral accessory branch in the great  saphenous vein from the knee to the saphenofemoral junction with  multiple stab phlebectomies, to be followed by a second course of stab  phlebectomies in the posterior calf.  We will proceed with  precertification.     Quita Skye Hart Rochester, M.D.  Electronically Signed   JDL/MEDQ  D:  06/05/2009  T:  06/06/2009  Job:  [id number]

## 2010-09-18 NOTE — Progress Notes (Signed)
Subjective:    Patient ID: CLEVON KHADER, male    DOB: 18-Feb-1951, 60 y.o.   MRN: [id number]  HPI  Here to f/u; overall doing ok,  Pt denies chest pain, increased sob or doe, wheezing, orthopnea, PND, increased LE swelling, palpitations, dizziness or syncope.  Pt denies new neurological symptoms such as new headache, or facial or extremity weakness or numbness   Pt denies polydipsia, polyuria, or low sugar symptoms such as weakness or confusion improved with po intake.  Pt states overall good compliance with meds, trying to follow lower cholesterol, diabetic diet, wt overall stable but little exercise however.  Did lose overall 12 lbs since last visit with intervening ventral hernia surgury, successful per pt, per Dr Michaell Cowing.   Past Medical History  Diagnosis Date  . Diverticulosis   . Hemorrhoids   . Hx of adenomatous colonic polyps   . Hyperlipidemia   . Hypertension   . CVA (cerebral vascular accident) 2003  . Anticardiolipin antibody positive   . Perineal abscess   . Diabetes mellitus     type II  . GERD (gastroesophageal reflux disease)   . Peripheral vascular disease     peripheral vascular disease/carotid artery <100%  . Anxiety   . Depression   . 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 CEREBROVASCULAR ACCIDENT, HX OF 07/19/2008  . ANXIETY 07/19/2008  . DEPRESSION 07/19/2008  . DIABETES MELLITUS, TYPE II 01/18/2008  . GERD 07/19/2008  . HYPERLIPIDEMIA 07/19/2008  . HYPERTENSION 07/31/2009  . Increased prostate specific antigen (PSA) velocity 09/13/2010  . Unspecified Peripheral Vascular Disease 01/18/2008  . PERSONAL HX COLONIC POLYPS 12/10/2007   Past Surgical History  Procedure Date  . Hernia surgury     60 years old  . Perineal abscess   . Sp endo repair infraren aaa   . S/p right and left popliteal aneurysm repair   . 7 abdominal hernia repairs 05/31/10  . Colonoscopy   . Polypectomy     reports that he has never smoked. He does not have any smokeless tobacco  history on file. He reports that he drinks about 1.2 ounces of alcohol per week. He reports that he does not use illicit drugs. family history includes Aortic aneurysm in his mother and Cancer in his brother. Allergies  Allergen Reactions  . Latex Rash   Current Outpatient Prescriptions on File Prior to Visit  Medication Sig Dispense Refill  . ascorbic Acid (VITAMIN C CR) 500 MG CPCR Take 500 mg by mouth 2 (two) times daily.        Marland Kitchen aspirin 81 MG tablet Take 81 mg by mouth daily.        Marland Kitchen atorvastatin (LIPITOR) 40 MG tablet Take 40 mg by mouth daily.        . cetirizine (ZYRTEC) 10 MG tablet Take 10 mg by mouth daily.        . clopidogrel (PLAVIX) 75 MG tablet Take 75 mg by mouth daily.        Marland Kitchen docusate sodium (COLACE) 100 MG capsule Take 100 mg by mouth 2 (two) times daily.        . Fiber POWD Take by mouth daily.        . folic acid (FOLVITE) 1 MG tablet Take 1 mg by mouth daily.        Marland Kitchen gemfibrozil (LOPID) 600 MG tablet Take 600 mg by mouth 2 (two) times daily.        . mesalamine (CANASA) 1000  MG suppository Place 1 suppository (1,000 mg total) rectally 2 (two) times daily as needed. One suppository into rectum two times a day as needed  60 suppository  5  . Multiple Vitamin (MULTIVITAMIN) capsule Take 1 capsule by mouth daily.        Marland Kitchen olmesartan-hydrochlorothiazide (BENICAR HCT) 40-25 MG per tablet Take 1 tablet by mouth daily.        . Omega-3 Fatty Acids (FISH OIL PO) Take by mouth daily.        . ranitidine (ZANTAC) 300 MG capsule Take 300 mg by mouth daily.        . betamethasone dipropionate (DIPROLENE) 0.05 % cream Apply topically daily.         Review of Systems All otherwise neg per pt     Objective:   Physical Exam BP 102/70  Pulse 80  Temp(Src) 97.7 F (36.5 C) (Oral)  Ht 5\' 10"  (1.778 m)  Wt 228 lb (103.42 kg)  BMI 32.71 kg/m2  SpO2 97% Physical Exam  VS noted Constitutional: Pt appears well-developed and well-nourished.  HENT: Head: Normocephalic.  Right  Ear: External ear normal.  Left Ear: External ear normal.  Eyes: Conjunctivae and EOM are normal. Pupils are equal, round, and reactive to light.  Neck: Normal range of motion. Neck supple.  Cardiovascular: Normal rate and regular rhythm.   Pulmonary/Chest: Effort normal and breath sounds normal.  Abd:  Soft, NT, non-distended, + BS, has new mesh palpable in the midline Neurological: Pt is alert. No cranial nerve deficit. excpet speaks loudly and strains to hear normal speech Skin: Skin is warm. No erythema.  Psychiatric: Pt behavior is normal. Thought content normal.          Assessment & Plan:

## 2010-09-18 NOTE — Procedures (Signed)
LOWER EXTREMITY VENOUS REFLUX EXAM   INDICATION:  Right lower extremity varicose vein with pain and swelling.   EXAM:  Using color-flow imaging and pulse Doppler spectral analysis, the  right common femoral, superficial femoral, popliteal, posterior tibial,  greater and lesser saphenous veins are evaluated.  There is no evidence  suggesting deep venous insufficiency in the right lower extremity.   The right saphenofemoral junction is competent.  The right GSV is  ablated.   The right proximal short saphenous vein demonstrates competency.   GSV Diameter (used if found to be incompetent only)                                            Right    Left  Proximal Greater Saphenous Vein           cm       cm  Proximal-to-mid-thigh                     cm       cm  Mid thigh                                 cm       cm  Mid-distal thigh                          cm       cm  Distal thigh                              cm       cm  Knee                                      cm       cm   IMPRESSION:  1. Right greater saphenous vein is ablated.  2. The right greater saphenous vein is not aneurysmal.  3. The right greater saphenous vein is not tortuous.  4. The deep venous system is competent.  5. The right lesser saphenous vein is competent.  6. No evidence of deep venous thrombosis noted in the right leg.   ___________________________________________  Quita Skye. Hart Rochester, M.D.   MG/MEDQ  D:  07/18/2009  T:  07/19/2009  Job:  [id number]

## 2010-09-18 NOTE — Assessment & Plan Note (Signed)
OFFICE VISIT   JACOBO, MONCRIEF B  DOB:  21-Oct-1950                                       11/03/2008  OZHYQ#:[id number]   The patient returned to the office today after undergoing stress  Cardiolite evaluation.  This revealed no evidence of significant  reversible ischemia.  He therefore passed his test.   I have had a long talk with the patient and his wife about further  management of his problems.  He has a 5.2 cm AAA, the neck of the  aneurysm is not really very amendable to stent placement.  I do think he  will require an open repair for this.  He has bilateral popliteal  aneurysms.  The right leg popliteal aneurysm appears that it may be  amendable to endovascular treatment with a Viabahn graft.  The left leg  I think will require surgery.   The plan at this time is to go ahead and perform an arteriogram and  possibly stent the right popliteal aneurysm at that time.   Balinda Quails, M.D.  Electronically Signed   PGH/MEDQ  D:  11/03/2008  T:  11/04/2008  Job:  [id number]   cc:   Corwin Levins, MD

## 2010-09-18 NOTE — Discharge Summary (Signed)
David [name], David [name]               ACCOUNT NO.:  [id number]   MEDICAL RECORD NO.:  [id number]          PATIENT TYPE:  INP   LOCATION:  2018                         FACILITY:  [facility]   PHYSICIAN:  Balinda Quails, M.D.    DATE OF BIRTH:  [birth year]   DATE OF ADMISSION:  12/13/2008  DATE OF DISCHARGE:  12/18/2008                               DISCHARGE SUMMARY   CHIEF COMPLAINT:  Abdominal aortic aneurysm.   HISTORY OF PRESENT ILLNESS:  David [name] is a 60 year old gentleman who  was initially found to have bilateral popliteal aneurysms during workup  for varicose vein disease.  He was referred for further management of  his popliteal aneurysms.  He also had a CT of the abdomen which revealed  a  5.2 cm abdominal aortic aneurysm as well as verifying the bilateral  popliteal aneurysms.  In the week prior to admission, he underwent an  endovascular repair of his right popliteal aneurysm placement with a  Viabahn stent graft.  He was admitted on December 13, 2008, for elective  repair of his abdominal aortic aneurysm.   PAST MEDICAL HISTORY:  1. Hypertension.  2. Hyperlipidemia.  3. CVA.  4. Type 2 diabetes.   ALLERGIES:  No known drug allergies.   HOSPITAL COURSE:  The patient was taken to the operating room on December 13, 2008, for abdominal aortic aneurysm repair with a 20 mm straight  Dacron graft by Dr. Madilyn Fireman.  Postoperatively, the patient did well.  He  was in the ICU for 2 days.  His vital signs were stable.  He remained  afebrile except for initial temp of 101 on the first postoperative day.  He was begun on the Dilaudid PCA at full dose which makes him rather  sleepy so reduced the dose and his pain was controlled.  H and H was  10.2 and 28.9 on the second postoperative day.  He was begun on the  ambulation.  By the third postoperative day, his abdomen was soft.  He  was passing flatus and he was begun on a diet.  He was afebrile.  He was  feeling well up and about.  He was  given some Lasix on the fourth  postoperative day for diuresis.  By postop day #5 on December 18, 2008, he  was tolerating his diet.  He was ambulating, his pain was controlled on  p.o. pain medication.  His abdomen was soft.  Wounds are healing well.  Distal pulses were intact, and he was sent home.   DISPOSITION:  The patient was discharged home on December 18, 2008, he  will be followed in the office by Dr. Madilyn Fireman in 2 weeks.   DISCHARGE MEDICATIONS:  1. Benicar 40/25 mg daily.  2. Folic acid 1 mg daily.  3. Gemfibrozil 600 mg b.i.d.  4. Lipitor 40 mg daily.  5. Ranitidine 300 mg daily.  6. Aspirin 81 mg daily.  7. Colace 100 mg twice daily.  8. Fish oil twice daily.  9. Multivitamins daily.  10.Metformin 600 mg daily in the morning.  11.Tylenol as needed.  He was also given a prescription for Percocet 5/325 p.o. q.4 h. p.r.n.  pain #40.  He is to discontinue his Coumadin.      [facility], PA-C      P. Liliane Bade, M.D.  Electronically Signed    RR/MEDQ  D:  12/19/2008  T:  12/20/2008  Job:  [id number]

## 2010-09-18 NOTE — Assessment & Plan Note (Signed)
OFFICE VISIT   David [name], David [name]  DOB:  [birth year]                                       02/05/2010  VWUJW#:[id number]   The patient comes back today for an unscheduled visit.  He is status  post open abdominal aortic aneurysm repair by Dr. Madilyn Fireman on 12/13/2008.  I performed right popliteal aneurysm repair with a Viabahn stent on  12/06/2008 and then a left popliteal aneurysm repair with an above to  below-knee popliteal bypass graft on 02/14/2009.  The patient has been  doing very well; however, for the past month he has noticed a bulge in  the lower aspect of his incision.  He has not had any nausea or  vomiting.  No fevers or chills.  He comes in today for further  evaluation.   PHYSICAL EXAMINATION:  Heart rate 84, blood pressure 163/100, O2 sat  98%, temperature is 97.8.  General:  Well-appearing, in no distress.  Respirations nonlabored.  Cardiovascular:  Regular rate and rhythm.  Extremities:  Warm and well-perfused.  Abdomen:  Soft, nontender.  There  is a defect in the lower midline incision.  He does have diastasis, but  I feel there is also a hernia with bowel looped.  It is not  incarcerated.   ASSESSMENT/PLAN:  Probable midline incisional hernia.   PLAN:  The patient will be referred to general surgery for discussions  of repair.     Jorge Ny, MD  Electronically Signed   VWB/MEDQ  D:  02/05/2010  T:  02/06/2010  Job:  [id number]   cc:   [facility]

## 2010-09-18 NOTE — Procedures (Signed)
BYPASS GRAFT EVALUATION   INDICATION:  Follow up bilateral popliteal artery aneurysm repairs.   HISTORY:  Diabetes:  Borderline.  Cardiac:  No.  Hypertension:  Yes.  Smoking:  Previous.  Previous Surgery:  Repair of left popliteal aneurysm with an above-knee  to below-knee popliteal artery bypass graft on 02/14/2009, repair of  right popliteal aneurysm with SFA and popliteal artery stent on  12/06/2008, graft repair of abdominal aortic aneurysm on 12/13/2008.   SINGLE LEVEL ARTERIAL EXAM                               RIGHT              LEFT  Brachial:                    140                150  Anterior tibial:             140                111  Posterior tibial:            163                115  Peroneal:  Ankle/brachial index:        1.09               0.77   PREVIOUS ABI:  Date: 10/06/2008  RIGHT:  0.99  LEFT:  0.91   LOWER EXTREMITY BYPASS GRAFT DUPLEX EXAM:   DUPLEX:  1. Triphasic waveforms noted throughout the right superficial femoral      and popliteal artery stents with no increased velocities noted.  2. Monophasic Doppler waveforms noted throughout the left lower      extremity bypass graft with no increased velocities noted.  3. Mild diffuse plaque formations noted throughout the bilateral lower      extremity native arteries.  4. Maximum diameter measurements of the popliteal arteries are 1.1 x      1.4 cm on the right and 2.2 x 2.2 cm on the left.   IMPRESSION:  1. Patent right superficial femoral and popliteal artery stents and      left above-knee to below-knee popliteal artery bypass graft noted      with no evidence of stenosis.  2. Decreased left ankle-brachial index noted when compared to the      previous exam with the right ankle-brachial index remaining stable.  3. Additional velocity measurements noted on the attached worksheet.   ___________________________________________  V. Charlena Cross, MD   CH/MEDQ  D:   12/04/2009  T:  12/04/2009  Job:  [id number]

## 2010-09-18 NOTE — Procedures (Signed)
LOWER EXTREMITY VENOUS REFLUX EXAM   INDICATION:  Left leg varicosities.   EXAM:  Using color-flow imaging and pulse Doppler spectral analysis, the  left common femoral, superficial femoral, popliteal, posterior tibial,  greater and lesser saphenous veins are evaluated.  There is no evidence  suggesting deep venous insufficiency in the left lower extremity.   The left saphenofemoral junction is not competent with reflux of  >500  milliseconds. The left GSV is not competent with reflux >500  milliseconds with the caliber as described below.   The left proximal short saphenous vein demonstrates competency.   GSV Diameter (used if found to be incompetent only)                                            Right    Left  Proximal Greater Saphenous Vein           cm       0.84 cm  Proximal-to-mid-thigh                     cm       0.50 cm  Mid thigh                                 cm       Appears ablated  Mid-distal thigh                          cm       Appears ablated  Distal thigh                              cm       Appears ablated  Knee                                      cm       Appears ablated.   IMPRESSION:  1. Left greater saphenous vein reflux with >500 milliseconds is      identified with the caliber ranging from 0.5 cm to 0.84 cm      proximally to the saphenofemoral junction.  2. The left greater saphenous vein appears ablated from proximal to      mid thigh distally.  3. The deep venous system is competent.  4. The left greater saphenous vein appears with a lateral branch with      reflux >500 milliseconds identified with the calibers ranging from      0.50 to 0.31 knee to groin.  5. The left lesser saphenous vein is competent.        ___________________________________________  V. Charlena Cross, MD   CB/MEDQ  D:  06/05/2009  T:  06/06/2009  Job:  [id number]

## 2010-09-21 NOTE — Consult Note (Signed)
David [name], David [name]                           ACCOUNT NO.:  [id number]   MEDICAL RECORD NO.:  [id number]                   PATIENT TYPE:  INP   LOCATION:  0344                                 FACILITY:  [facility]   PHYSICIAN:  Rene Paci, M.D. [facility]          DATE OF BIRTH:  12/05/1950   DATE OF CONSULTATION:  01/10/2002  DATE OF DISCHARGE:                                   CONSULTATION   CHIEF COMPLAINT:  Left-sided weakness.   HISTORY OF PRESENT ILLNESS:  I was asked by Dr. Amador Cunas to see this  patient, a 60 year old married Caucasian gentleman who is right-handed.  The  patient has risk factors for stroke, including recently discovered  hypertension and hypercholesterolemia.  The patient had a waxing and waning  course of three episodes of tingling and numbness on the left side that  progressed to severe weakness on the left, and caused him to be brought to  the hospital.   On his initial assessment per Dr. Felicity Coyer he had 3 to 4/5 strength in his  left arm and leg, paresthesias on the left side, otherwise, nonfocal  examination.  The patient was evaluated with a MRI scan which I have  personally reviewed.  It shows five acute infarctions, four in the posterior  circulation, one involving the right ventral lateral pons which is served by  a short circumferential penetrator from the basilar artery, two involving  the superior cerebellar arteries on the left and right, and with lesions in  the cerebellar subcortical white matter, and the fourth in the left  occipital region involving the left posterior cerebral artery.  One is  approximately a subcentimeter lesion in the left frontal region which is  also the left middle cerebral artery.   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   I was asked to determine the etiology of the patient's dysfunction to make  recommendations for further workup and treatment.   PAST MEDICAL HISTORY:  The patient was scheduled to have a vein stripping  operation, but this is postponed because he was discovered to have  hypertension, and was sent to Dr. Oliver Barre who placed him on Diovan.  The  patient had been on Lipitor in the past, and this was continued.   The patient denies other problems such as heart disease, diabetes.  He does  have a smoking history (see social history).  He has not had previous heart  disease.   The patient has had no other hospitalizations.   PAST SURGICAL HISTORY:  None.   REVIEW OF SYMPTOMS:  Unremarkable for fever, recurrent infections of head,  neck, lungs, GI, or GU.  No rash, anemia, bruisability, diabetes, or thyroid  disease.  No arthritis, no bleeding discretias.  No dysuria or hematuria.  The patient does have significant varicosities on his legs.   MEDICATIONS:  1. Lipitor 40 mg q.d.  2. Norvasc 5 mg q.d.  3. Diovan 160/12.5 mg two q.d.   ALLERGIES:  No known drug allergies.   FAMILY HISTORY:  The patient's father is in his late 49's, and has had  prostate cancer.  Mother in her late 85's, she had an abdominal aortic  aneurysm repair.  Grandparents died of old age in their 31's.  He has two  brothers who are healthy.  There is no known history of hypertension,  coronary artery disease, cerebrovascular disease, diabetes, or hypertension.   SOCIAL HISTORY:  The patient is employed, lives with his wife.  He smokes  1/2 to 1 pack of cigarettes per day and has done so for a number of years.  No use of alcohol.   PHYSICAL EXAMINATION:  GENERAL:  An obese, pleasant, right-handed man in no  distress.  VITAL SIGNS:  Temperature 97.1, blood pressure 116/84, resting pulse 61,  respirations 20.  HEENT:   No signs of infection, no bruits.  LUNGS:  Clear.  HEART:  No murmurs, pulses normal.  ABDOMEN:  Soft, bowel sounds normal.  EXTREMITIES:  No edema or cyanosis.  NEUROLOGIC:  Mental status:  The patient was awake, alert, no dysphagia or  dyspraxia.  Cranial nerve examination:  Round reactive pupils.  Visual  fields full to double simultaneous stimuli.  Okay and responses equal.  The  patient has a mild left central seventh.  Tongue is midline.  No Horner's  syndrome.  Motor examination:  Slight left pronator drift, the patient is  very clumsy with his fine motor movements of the left.  Strength is fairly  well preserved, however.  Sensation:  Mild peripheral polyneuropathy, no  hemisensory.  The patient had good stereoagnosis, but it was much better on  the right than left.  The patient's gait was broad based, he falls to the  left when he walks.  Deep tendon reflexes are diminished to absent.  The  patient had a right flexor and a left extensor plantar response.   IMPRESSION:  1. The patient has infarctions in the posterior and anterior circulation,     these may very well be embolic, source is unknown, 434.11.  2. Hypertension without congestive heart failure, 404.10.  3. Hypercholesterolemia.   RECOMMENDATIONS:  1. I agree with the plan to perform a 2-D echocardiogram.  2. I would recommend crossing the patient from heparin to Coumadin for six     to eight weeks.  3. Readmit the patient after that time, restart heparin, discontinue     Coumadin, and perform a cerebral angiogram to look at the basilar artery     in particular.  There is nothing that we can do about the left internal     carotid.  4. Decided ahead of time whether or not stenting might be an appropriate     approach in the basilar artery for this patient.  There is certainly no     consensus that this is the correct long-term approach over medicine like    Coumadin.  There is also no consensus that Coumadin is  necessarily better     than aspirin, however, with this stenosis that is this type and rather     significant severe  posterior circulation disease, I think that this is     the appropriate approach unless we could be certain that he did not have     cardiogenic emboli.  5. The patient needs a rehabilitation consult to decide on inpatient versus     outpatient.  I would get occupational     therapy involved as well as physical therapy.  6. The patient needs morning serum homocystine, anticardiolipin antibody     panel, and lupus anticoagulant panel.  If you have any questions about     this or I can be of assistance, please do not hesitate to contact me.     Deanna Artis. Sharene Skeans, M.D.                 Rene Paci, M.D. [facility]    WHH/MEDQ  D:  01/10/2002  T:  01/11/2002  Job:  [id number]   cc:   Gordy Savers, M.D. [facility]

## 2010-09-21 NOTE — Discharge Summary (Signed)
David [name], David [name]                           ACCOUNT NO.:  [id number]   MEDICAL RECORD NO.:  [id number]                   PATIENT TYPE:  INP   LOCATION:  0344                                 FACILITY:  [facility]   PHYSICIAN:  Rene Paci, M.D. [facility]          DATE OF BIRTH:  [birth year]   DATE OF ADMISSION:  01/08/2002  DATE OF DISCHARGE:  01/12/2002                                 DISCHARGE SUMMARY   DISCHARGE DIAGNOSES:  1. 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  2. Hypertension.  3. Type 2 diabetes, new diagnosis.  4. Hypercholesterolemia.  5. Tobacco abuse.   DISCHARGE MEDICATIONS:  1. Lovenox 120 mg subcu q. 12 hours until September 12.  2. Coumadin 7.5 mg p.o. q.d.  3. Aspirin 81 mg p.o. q.d.  4. Diovan HCT 160/12.5 two tablets p.o. q.d.  5. Lipitor 40 mg p.o. q.d.   CONSULTATIONS:  Dr. Sharene Skeans.   PROCEDURES:  MRI, MRA done on September 6 showing acute right pontine  infarct, multiple bilateral punctate acute infarcts in the bilateral  cerebellum, left frontal lobe and left occipital lobe. MRA shows occlusion  of the left ICA, left MCA with filling and stenosis of the mid portion of  the vertebrobasilar artery.   HOSPITAL FOLLOWUP:  1. Outpatient rehabilitation offices to be arranged by patient with Dr.     Thersa Salt.  2. Outpatient physical therapy and occupational therapy also at [facility]     Outpatient Rehab to be arranged by the patient after discharge.  3. [facility] on Friday, September 12.  4. Primary care physician, Dr. Efrain Sella in the next 1-2 weeks to follow-up     on the above medical issues.  5. Dr. Sharene Skeans in about four weeks after hospital discharge to arrange     angiography with possible stenting.   BRIEF HISTORY AND PHYSICAL:  This is a 60 year old gentleman with newly  diagnosed hypertension who was brought to the emergency room secondary to  apparent weakness on the left side  causing a fall the morning of admission.  He reports three episodes each under 10 minute duration the day prior to  admission of paresthesias on the left side. He went to bed the night prior  to admission feeling tired on the left side but no specific weakness or  numbness. He then awoke around 2 a.m. to travel to the restroom and fell.  When his wife tried to help him up, they realized he had no apparent  strength or coordination on the left side of his body. The strength would  wax and wane and they sought further evaluation in the [facility] Emergency  Room. He was noted to have continued waxing and waning neurological deficits  on the left side of his body and was thereby admitted for further evaluation  and treatment of an apparent stroke.   1. Acute  CVA, multiple sites. As stated above, the patient had waxing waning     neurologic exam and was thereby started immediately on IV heparin. An     MRI/MRA of his head was obtained showing the above listed results of     multiple strokes. Neurology was consulted for assistance with this     patient. In addition to controlling his risk factors for CVAs as listed     below, the patient is to follow-up with neurology in four to six weeks     post discharge for possible intervention given the results of his MRA. It     is recommended that the patient be continued on long-term anticoagulation     until this date or further discussion with his neurologist. Therefore,     the patient was begun on Coumadin and as his INR was not yet therapeutic     at the time of discharge, he will be covered with low molecular weight     injections until INR is therapeutic. At the time of discharge, his     neurological deficits of discoordination on the left side of his body     appeared to have stabilized, he is arrange for outpatient rehabilitation     following discharge.  2. Hypertension. As stated above, the patient had only been diagnosed with      hypertension approximately six weeks prior to admission. His pressure     remained well controlled during admission and in fact one of his original     home medications, Norvasc 5 mg was discontinued during hospitalization     because of the lack of this medication for control. He will follow-up     with his primary care physician on this matter.  3. Type 2 diabetes, newly diagnosed. During the patient's hospital stay, he     had several fasting CBGs greater than 126 qualifying him for the     diagnosis of type 2 diabetes. He was given instructions and education on     diabetes and diabetic diet but not begun on any hyperglycemic agents or     Metformin as the patient appears very motivated to lose weight and     dietary and exercise to control his diagnosis given recent events. He     will follow-up with his primary care physician on this issue.  4. Hypercholesterolemia with hypertriglyceridemia. The patient was on     Lipitor 40 mg at the time of admission and will be continued on those at     the time of discharge. He is reminded that his triglycerides will likely     improve with control of his diabetes and this will be followed up with     his primary care physician.  5. Tobacco abuse. The patient understands this is a risk factor causing     worsening of his above listed problems and is committed to cessation.     This is encouraged and will be followed up by his primary care physician.                                               Rene Paci, M.D. [facility]    VL/MEDQ  D:  01/22/2002  T:  01/22/2002  Job:  [id number]   cc:   Corwin Levins, M.D. [facility]   Deanna Artis. Hickling,  M.D.  [address]  [place]  [place] [address]  Fax: [phone]

## 2010-10-06 ENCOUNTER — Other Ambulatory Visit: Payer: Self-pay | Admitting: Internal Medicine

## 2010-11-26 ENCOUNTER — Ambulatory Visit: Payer: PRIVATE HEALTH INSURANCE | Admitting: Surgery

## 2010-11-26 ENCOUNTER — Other Ambulatory Visit: Payer: PRIVATE HEALTH INSURANCE

## 2011-01-11 ENCOUNTER — Encounter: Payer: Self-pay | Admitting: Surgery

## 2011-01-14 ENCOUNTER — Other Ambulatory Visit (INDEPENDENT_AMBULATORY_CARE_PROVIDER_SITE_OTHER): Payer: PRIVATE HEALTH INSURANCE | Admitting: *Deleted

## 2011-01-14 ENCOUNTER — Ambulatory Visit (INDEPENDENT_AMBULATORY_CARE_PROVIDER_SITE_OTHER): Payer: PRIVATE HEALTH INSURANCE | Admitting: Surgery

## 2011-01-14 ENCOUNTER — Other Ambulatory Visit (INDEPENDENT_AMBULATORY_CARE_PROVIDER_SITE_OTHER): Payer: PRIVATE HEALTH INSURANCE

## 2011-01-14 ENCOUNTER — Encounter: Payer: Self-pay | Admitting: Surgery

## 2011-01-14 ENCOUNTER — Encounter (INDEPENDENT_AMBULATORY_CARE_PROVIDER_SITE_OTHER): Payer: PRIVATE HEALTH INSURANCE | Admitting: *Deleted

## 2011-01-14 VITALS — BP 164/100 | HR 66 | Resp 14 | Wt 246.0 lb

## 2011-01-14 DIAGNOSIS — I739 Peripheral vascular disease, unspecified: Secondary | ICD-10-CM

## 2011-01-14 DIAGNOSIS — I6529 Occlusion and stenosis of unspecified carotid artery: Secondary | ICD-10-CM

## 2011-01-14 DIAGNOSIS — I724 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: Secondary | ICD-10-CM

## 2011-01-14 DIAGNOSIS — Z48812 Encounter for surgical aftercare following surgery on the circulatory system: Secondary | ICD-10-CM

## 2011-01-14 NOTE — Progress Notes (Signed)
CC:  F/u Bypass HPI:  The patient comes back today for followup. He has undergone open abdominal aortic aneurysm repair by Dr. Madilyn Fireman in 2012. 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 He had his left popliteal aneurysm repaired by above-knee to below-knee bypass in October of 2010 he's had a midline abdominal hernia repair by Dr. gross. He had elevated velocities in the proximal anastomosis of his bypass graft and underwent arteriogram which revealed a high-grade stenosis extending into his bypass graft balloon angioplasty was performed however this resulted in a dissection and he therefore underwent stenting using a 6 mm stent he is back today for followup he has no complaints. He has gained some weight recently.  Past Medical History  Diagnosis Date  . Diverticulosis   . Hemorrhoids   . Hx of adenomatous colonic polyps   . Hyperlipidemia   . Hypertension   . CVA (cerebral vascular accident) 2003  . Anticardiolipin antibody positive   . Perineal abscess   . Diabetes mellitus     type II  . GERD (gastroesophageal reflux disease)   . Peripheral vascular disease     peripheral vascular disease/carotid artery <100%  . Anxiety   . Depression   . 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 CEREBROVASCULAR ACCIDENT, HX OF 07/19/2008  . ANXIETY 07/19/2008  . DEPRESSION 07/19/2008  . DIABETES MELLITUS, TYPE II 01/18/2008  . GERD 07/19/2008  . HYPERLIPIDEMIA 07/19/2008  . HYPERTENSION 07/31/2009  . Increased prostate specific antigen (PSA) velocity 09/13/2010  . Unspecified Peripheral Vascular Disease 01/18/2008  . PERSONAL HX COLONIC POLYPS 12/10/2007    History  Substance Use Topics  . Smoking status: Former Smoker    Types: Cigarettes    Quit date: 01/14/1992  . Smokeless tobacco: Never Used  . Alcohol Use: 1.2 oz/week    2 Glasses of wine per week     wine daily    Family History  Problem Relation Age of Onset  . Aortic aneurysm Mother     desceding aoritic  aneurysm  . Cancer Brother     Lung cancer    Allergies  Allergen Reactions  . Latex Rash    Current outpatient prescriptions:ascorbic Acid (VITAMIN C CR) 500 MG CPCR, Take 500 mg by mouth 2 (two) times daily.  , Disp: , Rfl: ;  aspirin 81 MG tablet, Take 81 mg by mouth daily.  , Disp: , Rfl: ;  atorvastatin (LIPITOR) 40 MG tablet, Take 1 tablet (40 mg total) by mouth daily., Disp: 90 tablet, Rfl: 3;  cetirizine (ZYRTEC) 10 MG tablet, Take 10 mg by mouth daily.  , Disp: , Rfl:  clopidogrel (PLAVIX) 75 MG tablet, Take 75 mg by mouth daily.  , Disp: , Rfl: ;  docusate sodium (COLACE) 100 MG capsule, Take 100 mg by mouth 2 (two) times daily.  , Disp: , Rfl: ;  Fiber POWD, Take by mouth daily.  , Disp: , Rfl: ;  folic acid (FOLVITE) 1 MG tablet, Take 1 tablet (1 mg total) by mouth daily., Disp: 90 tablet, Rfl: 3 gemfibrozil (LOPID) 600 MG tablet, Take 1 tablet (600 mg total) by mouth 2 (two) times daily., Disp: 180 tablet, Rfl: 3;  Multiple Vitamin (MULTIVITAMIN) capsule, Take 1 capsule by mouth daily.  , Disp: , Rfl: ;  olmesartan-hydrochlorothiazide (BENICAR HCT) 40-25 MG per tablet, Take 1 tablet by mouth daily., Disp: 90 tablet, Rfl: 3;  Omega-3 Fatty Acids (FISH OIL PO), Take by mouth daily.  ,  Disp: , Rfl:  ranitidine (ZANTAC) 300 MG capsule, Take 1 capsule (300 mg total) by mouth daily., Disp: 90 capsule, Rfl: 3;  mesalamine (CANASA) 1000 MG suppository, Place 1 suppository (1,000 mg total) rectally 2 (two) times daily as needed. One suppository into rectum two times a day as needed, Disp: 60 suppository, Rfl: 5  BP 164/100  Pulse 66  Resp 14  Wt 246 lb (111.585 kg)  Physical exam: General: Well-appearing in no acute distress Cardiovascular: Palpable pedal pulses Pulmonary: Respirations nonlabored Extremities: Warm and well perfused no edema no ulceration  Diagnostic studies: Carotid ultrasound: Minimal right carotid stenosis, less than 30%. Occluded left carotid Right ABIs 1.0 left is  0.94 Bypass graft on the left and stent on the right are widely patent.  Assessment: Abdominal and bilateral popliteal aneurysm Plan: The patient is doing very well this time we will keep him on ultrasound surveillance protocol I will plan on seeing him back in one year unless there is a problem with his ultrasound

## 2011-01-15 NOTE — Progress Notes (Signed)
Addended by: Sharee Pimple on: 01/15/2011 08:49 AM   Modules accepted: Orders

## 2011-01-21 NOTE — Procedures (Unsigned)
BYPASS GRAFT EVALUATION  INDICATION:  Follow up left popliteal graft and stent and right lower extremity stent.  HISTORY: Diabetes:  Yes. Cardiac: Hypertension:  Yes. Smoking:  Previous. Previous Surgery:  Right popliteal aneurysm stent placed 12/06/2008, AAA repaired performed 12/13/2008, left popliteal aneurysm graft, 02/14/2009 with angio and stent, 08/07/2010.  SINGLE LEVEL ARTERIAL EXAM                              RIGHT              LEFT Brachial: Anterior tibial: Posterior tibial: Peroneal: Ankle/brachial index:  PREVIOUS ABI:  Date:  08/27/2010  RIGHT:  1.01  LEFT:  0.94  LOWER EXTREMITY BYPASS GRAFT DUPLEX EXAM:  DUPLEX: 1. Technically difficult study due to scarring, graft location, and     shadowing. 2. Patent right superficial femoral artery/popliteal stent with     diffuse plaquing in the inflow and outflow arteries.  Velocities     are mildly elevated in this area, but the stent itself appears to     be without restenosis. 3. Patent left popliteal graft and stent with diffuse disease in the     inflow and outflow arteries. 4. Please see attached diagram for velocity details.  IMPRESSION: 1. Patent right superficial femoral artery/popliteal stent, as     described above. 2. Patent left popliteal graft and stent, as described above.  ___________________________________________ V. Charlena Cross, MD  LT/MEDQ  D:  01/14/2011  T:  01/14/2011  Job:  [id number]

## 2011-01-21 NOTE — Procedures (Unsigned)
CAROTID DUPLEX EXAM  INDICATION:  Follow up right carotid disease.  History of known left ICA occlusion.  HISTORY: Diabetes:  Yes. Cardiac: Hypertension:  Yes. Smoking:  Previous. Previous Surgery:  Multiple lower extremity interventions. CV History:  CVA. Amaurosis Fugax No, Paresthesias No, Hemiparesis No.                                      RIGHT             LEFT Brachial systolic pressure:         159               164 Brachial Doppler waveforms:         WNL               WNL Vertebral direction of flow:        Antegrade DUPLEX VELOCITIES (cm/sec) CCA peak systolic                   75 ECA peak systolic                   153 ICA peak systolic                   87 ICA end diastolic                   35 PLAQUE MORPHOLOGY:                  Heterogenous PLAQUE AMOUNT:                      Minimal PLAQUE LOCATION:                    Bifurcation  IMPRESSION: 1. 1% to 39% right internal carotid artery plaquing. 2. Right vertebral artery is within normal limits. 3. Stable results compared to 2010.  ___________________________________________ V. Charlena Cross, MD  LT/MEDQ  D:  01/14/2011  T:  01/14/2011  Job:  [id number]

## 2011-02-13 ENCOUNTER — Ambulatory Visit (INDEPENDENT_AMBULATORY_CARE_PROVIDER_SITE_OTHER): Payer: PRIVATE HEALTH INSURANCE | Admitting: Internal Medicine

## 2011-02-13 ENCOUNTER — Ambulatory Visit: Payer: PRIVATE HEALTH INSURANCE

## 2011-02-13 ENCOUNTER — Encounter: Payer: Self-pay | Admitting: Internal Medicine

## 2011-02-13 VITALS — BP 140/80 | HR 73 | Temp 98.6°F | Ht 70.0 in | Wt 252.0 lb

## 2011-02-13 DIAGNOSIS — R3989 Other symptoms and signs involving the genitourinary system: Secondary | ICD-10-CM

## 2011-02-13 DIAGNOSIS — E119 Type 2 diabetes mellitus without complications: Secondary | ICD-10-CM

## 2011-02-13 DIAGNOSIS — I10 Essential (primary) hypertension: Secondary | ICD-10-CM

## 2011-02-13 DIAGNOSIS — R319 Hematuria, unspecified: Secondary | ICD-10-CM | POA: Insufficient documentation

## 2011-02-13 DIAGNOSIS — N399 Disorder of urinary system, unspecified: Secondary | ICD-10-CM

## 2011-02-13 DIAGNOSIS — Z0000 Encounter for general adult medical examination without abnormal findings: Secondary | ICD-10-CM

## 2011-02-13 LAB — URINALYSIS, ROUTINE W REFLEX MICROSCOPIC
Nitrite: NEGATIVE
Specific Gravity, Urine: <=1.005 (ref 1.000–1.030)
Urine Glucose: NEGATIVE
Urobilinogen, UA: 0.2 (ref 0.0–1.0)

## 2011-02-13 LAB — MICROALBUMIN / CREATININE URINE RATIO: Microalb, Ur: 6.8 mg/dL — ABNORMAL HIGH (ref 0.0–1.9)

## 2011-02-13 NOTE — Patient Instructions (Addendum)
Continue all other medications as before, including the plavix Please go to LAB in the Basement for the urine tests to be done today

## 2011-02-13 NOTE — Assessment & Plan Note (Signed)
Is on plavix, pt not actually sure if had BRB, and exam bening o/w today - for UA and cx today, but o/w  to f/u any worsening symptoms or concerns

## 2011-02-13 NOTE — Progress Notes (Signed)
Subjective:    Patient ID: David [name], male    DOB: 07/02/1950, 60 y.o.   MRN: [id number]  HPI  Here after have a "darker" urine than normal 2 nights ago, better with drinking more fluids.  Normally drinks minimum half gallon fluid per day but no change prior to urine darker. No frank BRB per pt, and  Denies urinary symptoms such as dysuria, frequency, urgency,or hematuria, n/v.  Pt denies fever, wt loss, night sweats, loss of appetite, or other constitutional symptoms. No back pain.  No hx of renal stones. No juandice or itching worse than his usual dry skin.   Here to f/u; overall doing ok,  Pt denies chest pain, increased sob or doe, wheezing, orthopnea, PND, increased LE swelling, palpitations, dizziness or syncope.  Pt denies new neurological symptoms such as new headache, or facial or extremity weakness or numbness   Pt denies polydipsia, polyuria, or low sugar symptoms such as weakness or confusion improved with po intake.  Pt states overall good compliance with meds, trying to follow lower cholesterol, diabetic diet, wt overall stable but little exercise however. Past Medical History  Diagnosis Date  . Diverticulosis   . Hemorrhoids   . Hx of adenomatous colonic polyps   . Hyperlipidemia   . Hypertension   . CVA (cerebral vascular accident) 2003  . Anticardiolipin antibody positive   . Perineal abscess   . Diabetes mellitus     type II  . GERD (gastroesophageal reflux disease)   . Peripheral vascular disease     peripheral vascular disease/carotid artery <100%  . Anxiety   . Depression   . 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 CEREBROVASCULAR ACCIDENT, HX OF 07/19/2008  . ANXIETY 07/19/2008  . DEPRESSION 07/19/2008  . DIABETES MELLITUS, TYPE II 01/18/2008  . GERD 07/19/2008  . HYPERLIPIDEMIA 07/19/2008  . HYPERTENSION 07/31/2009  . Increased prostate specific antigen (PSA) velocity 09/13/2010  . Unspecified Peripheral Vascular Disease 01/18/2008  . PERSONAL HX COLONIC  POLYPS 12/10/2007   Past Surgical History  Procedure Date  . Hernia surgury     60 years old  . Perineal abscess   . Sp endo repair infraren aaa   . S/p right and left popliteal aneurysm repair   . 7 abdominal hernia repairs 05/31/10  . Colonoscopy   . Polypectomy     reports that he quit smoking about 19 years ago. His smoking use included Cigarettes. He has never used smokeless tobacco. He reports that he drinks about 1.2 ounces of alcohol per week. He reports that he does not use illicit drugs. family history includes Aortic aneurysm in his mother and Cancer in his brother. Allergies  Allergen Reactions  . Latex Rash   Current Outpatient Prescriptions on File Prior to Visit  Medication Sig Dispense Refill  . ascorbic Acid (VITAMIN C CR) 500 MG CPCR Take 500 mg by mouth 2 (two) times daily.        Marland Kitchen aspirin 81 MG tablet Take 81 mg by mouth daily.        Marland Kitchen atorvastatin (LIPITOR) 40 MG tablet Take 1 tablet (40 mg total) by mouth daily.  90 tablet  3  . cetirizine (ZYRTEC) 10 MG tablet Take 10 mg by mouth daily.        . clopidogrel (PLAVIX) 75 MG tablet Take 75 mg by mouth daily.        Marland Kitchen docusate sodium (COLACE) 100 MG capsule Take 100 mg by mouth 2 (two)  times daily.        . Fiber POWD Take by mouth daily.        . folic acid (FOLVITE) 1 MG tablet Take 1 tablet (1 mg total) by mouth daily.  90 tablet  3  . gemfibrozil (LOPID) 600 MG tablet Take 1 tablet (600 mg total) by mouth 2 (two) times daily.  180 tablet  3  . mesalamine (CANASA) 1000 MG suppository Place 1 suppository (1,000 mg total) rectally 2 (two) times daily as needed. One suppository into rectum two times a day as needed  60 suppository  5  . Multiple Vitamin (MULTIVITAMIN) capsule Take 1 capsule by mouth daily.        Marland Kitchen olmesartan-hydrochlorothiazide (BENICAR HCT) 40-25 MG per tablet Take 1 tablet by mouth daily.  90 tablet  3  . Omega-3 Fatty Acids (FISH OIL PO) Take by mouth daily.        . ranitidine (ZANTAC) 300 MG  capsule Take 1 capsule (300 mg total) by mouth daily.  90 capsule  3   Review of Systems Review of Systems  Constitutional: Negative for diaphoresis and unexpected weight change.  HENT: Negative for drooling and tinnitus.   Eyes: Negative for photophobia and visual disturbance.  Respiratory: Negative for choking and stridor.   Gastrointestinal: Negative for vomiting and blood in stool.  Genitourinary: Negative for hematuria and decreased urine volume.    Objective:   Physical Exam BP 140/80  Pulse 73  Temp(Src) 98.6 F (37 C) (Oral)  Ht 5\' 10"  (1.778 m)  Wt 252 lb (114.306 kg)  BMI 36.16 kg/m2  SpO2 98% Physical Exam  VS noted Constitutional: Pt appears well-developed and well-nourished.  HENT: Head: Normocephalic.  Right Ear: External ear normal.  Left Ear: External ear normal.  Eyes: Conjunctivae and EOM are normal. Pupils are equal, round, and reactive to light.  Neck: Normal range of motion. Neck supple.  Cardiovascular: Normal rate and regular rhythm.   Pulmonary/Chest: Effort normal and breath sounds normal.  Abd:  Soft, NT, non-distended, + BS Neurological: Pt is alert. No cranial nerve deficit.  Skin: Skin is warm. No erythema.  Psychiatric: Pt behavior is normal. Thought content normal.  GU: normal male genitals without ulcer, erythema, swelling, mass or d/c    Assessment & Plan:

## 2011-02-14 ENCOUNTER — Telehealth: Payer: Self-pay

## 2011-02-14 ENCOUNTER — Encounter: Payer: Self-pay | Admitting: Internal Medicine

## 2011-02-14 NOTE — Telephone Encounter (Signed)
Ok to forward to [employer]	[place] - I will do

## 2011-02-14 NOTE — Assessment & Plan Note (Signed)
stable overall by hx and exam, most recent data reviewed with pt, and pt to continue medical treatment as before  BP Readings from Last 3 Encounters:  02/13/11 140/80  01/14/11 164/100  09/18/10 102/70

## 2011-02-14 NOTE — Assessment & Plan Note (Signed)
stable overall by hx and exam, most recent data reviewed with pt, and pt to continue medical treatment as before  Lab Results  Component Value Date   HGBA1C 5.7 09/13/2010

## 2011-02-14 NOTE — Telephone Encounter (Signed)
The patient called requesting his Urology referral be at [facility]. They are in his network.

## 2011-03-21 ENCOUNTER — Ambulatory Visit: Payer: PRIVATE HEALTH INSURANCE | Admitting: Internal Medicine

## 2011-04-15 ENCOUNTER — Ambulatory Visit (INDEPENDENT_AMBULATORY_CARE_PROVIDER_SITE_OTHER): Payer: No Typology Code available for payment source | Admitting: *Deleted

## 2011-04-15 ENCOUNTER — Other Ambulatory Visit (INDEPENDENT_AMBULATORY_CARE_PROVIDER_SITE_OTHER): Payer: No Typology Code available for payment source | Admitting: *Deleted

## 2011-04-15 DIAGNOSIS — I739 Peripheral vascular disease, unspecified: Secondary | ICD-10-CM

## 2011-04-15 DIAGNOSIS — Z48812 Encounter for surgical aftercare following surgery on the circulatory system: Secondary | ICD-10-CM

## 2011-04-15 DIAGNOSIS — I724 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: Secondary | ICD-10-CM

## 2011-04-18 ENCOUNTER — Other Ambulatory Visit (INDEPENDENT_AMBULATORY_CARE_PROVIDER_SITE_OTHER): Payer: No Typology Code available for payment source

## 2011-04-18 ENCOUNTER — Other Ambulatory Visit: Payer: Self-pay | Admitting: Internal Medicine

## 2011-04-18 DIAGNOSIS — Z0000 Encounter for general adult medical examination without abnormal findings: Secondary | ICD-10-CM

## 2011-04-18 DIAGNOSIS — N399 Disorder of urinary system, unspecified: Secondary | ICD-10-CM

## 2011-04-18 DIAGNOSIS — R3989 Other symptoms and signs involving the genitourinary system: Secondary | ICD-10-CM

## 2011-04-18 DIAGNOSIS — E119 Type 2 diabetes mellitus without complications: Secondary | ICD-10-CM

## 2011-04-18 LAB — BASIC METABOLIC PANEL
CO2: 28 mEq/L (ref 19–32)
Calcium: 10 mg/dL (ref 8.4–10.5)
GFR: 76.78 mL/min (ref >60.00)
Potassium: 4 mEq/L (ref 3.5–5.1)
Sodium: 137 mEq/L (ref 135–145)

## 2011-04-18 LAB — CBC WITH DIFFERENTIAL/PLATELET
Eosinophils Relative: 6.8 % — ABNORMAL HIGH (ref 0.0–5.0)
Monocytes Relative: 10.9 % (ref 3.0–12.0)
Neutrophils Relative %: 57.7 % (ref 43.0–77.0)
Platelets: 208 10*3/uL (ref 150.0–400.0)
RBC: 4.15 Mil/uL — ABNORMAL LOW (ref 4.22–5.81)
WBC: 4.9 10*3/uL (ref 4.5–10.5)

## 2011-04-18 LAB — LIPID PANEL
HDL: 33.7 mg/dL — ABNORMAL LOW (ref >39.00)
Triglycerides: 288 mg/dL — ABNORMAL HIGH (ref 0.0–149.0)
VLDL: 57.6 mg/dL — ABNORMAL HIGH (ref 0.0–40.0)

## 2011-04-18 LAB — TSH: TSH: 1.22 u[IU]/mL (ref 0.35–5.50)

## 2011-04-18 LAB — LDL CHOLESTEROL, DIRECT: Direct LDL: 53.8 mg/dL

## 2011-04-18 LAB — HEPATIC FUNCTION PANEL
ALT: 39 U/L (ref 0–53)
Total Bilirubin: 0.8 mg/dL (ref 0.3–1.2)
Total Protein: 7.5 g/dL (ref 6.0–8.3)

## 2011-04-18 LAB — PSA: PSA: 1.82 ng/mL (ref 0.10–4.00)

## 2011-04-19 LAB — HEMOGLOBIN A1C: Hgb A1c MFr Bld: 6.5 % (ref 4.6–6.5)

## 2011-04-22 ENCOUNTER — Other Ambulatory Visit: Payer: Self-pay

## 2011-04-22 ENCOUNTER — Ambulatory Visit (INDEPENDENT_AMBULATORY_CARE_PROVIDER_SITE_OTHER): Payer: No Typology Code available for payment source | Admitting: Internal Medicine

## 2011-04-22 ENCOUNTER — Encounter: Payer: Self-pay | Admitting: Internal Medicine

## 2011-04-22 ENCOUNTER — Encounter: Payer: Self-pay | Admitting: Surgery

## 2011-04-22 VITALS — BP 120/78 | HR 68 | Temp 98.3°F | Ht 70.0 in | Wt 244.5 lb

## 2011-04-22 DIAGNOSIS — Z48812 Encounter for surgical aftercare following surgery on the circulatory system: Secondary | ICD-10-CM

## 2011-04-22 DIAGNOSIS — I6529 Occlusion and stenosis of unspecified carotid artery: Secondary | ICD-10-CM

## 2011-04-22 DIAGNOSIS — I739 Peripheral vascular disease, unspecified: Secondary | ICD-10-CM

## 2011-04-22 DIAGNOSIS — E119 Type 2 diabetes mellitus without complications: Secondary | ICD-10-CM

## 2011-04-22 DIAGNOSIS — Z0000 Encounter for general adult medical examination without abnormal findings: Secondary | ICD-10-CM

## 2011-04-22 MED ORDER — GEMFIBROZIL 600 MG PO TABS: 600.0000 mg | ORAL_TABLET | Freq: Two times a day (BID) | ORAL | Status: DC

## 2011-04-22 MED ORDER — ATORVASTATIN CALCIUM 40 MG PO TABS: 40.0000 mg | ORAL_TABLET | Freq: Every day | ORAL | Status: DC

## 2011-04-22 MED ORDER — OLMESARTAN MEDOXOMIL-HCTZ 40-25 MG PO TABS: 1.0000 | ORAL_TABLET | Freq: Every day | ORAL | Status: DC

## 2011-04-22 MED ORDER — FOLIC ACID 1 MG PO TABS: 1.0000 mg | ORAL_TABLET | Freq: Every day | ORAL | Status: DC

## 2011-04-22 MED ORDER — CLOPIDOGREL BISULFATE 75 MG PO TABS: 75.0000 mg | ORAL_TABLET | Freq: Every day | ORAL | Status: DC

## 2011-04-22 MED ORDER — RANITIDINE HCL 150 MG PO TABS: 150.0000 mg | ORAL_TABLET | Freq: Two times a day (BID) | ORAL | Status: DC

## 2011-04-22 NOTE — Progress Notes (Signed)
Subjective:    Patient ID: David [name], male    DOB: [birth year], 60 y.o.   MRN: [id number]  HPI  Here for wellness and f/u;  Overall doing ok;  Pt denies CP, worsening SOB, DOE, wheezing, orthopnea, PND, worsening LE edema, palpitations, dizziness or syncope.  Pt denies neurological change such as new Headache, facial or extremity weakness.  Pt denies polydipsia, polyuria, or low sugar symptoms. Pt states overall good compliance with treatment and medications, good tolerability, and trying to follow lower cholesterol diet.  Pt denies worsening depressive symptoms, suicidal ideation or panic. No fever, wt loss, night sweats, loss of appetite, or other constitutional symptoms.  Pt states good ability with ADL's, low fall risk, home safety reviewed and adequate, no significant changes in hearing or vision, and occasionally active with exercise.  Hs gained several lbs recently but plans to do better in the next yr.  Saw derm for second opinion for a distal LLE rash and tx for a type of psoriasis that can cause some swelling, and much improved with optical steroid.  Past Medical History  Diagnosis Date  . Diverticulosis   . Hemorrhoids   . Hx of adenomatous colonic polyps   . Hyperlipidemia   . Hypertension   . CVA (cerebral vascular accident) 2003  . Anticardiolipin antibody positive   . Perineal abscess   . Diabetes mellitus     type II  . GERD (gastroesophageal reflux disease)   . Peripheral vascular disease     peripheral vascular disease/carotid artery <100%  . Anxiety   . Depression   . 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 CEREBROVASCULAR ACCIDENT, HX OF 07/19/2008  . ANXIETY 07/19/2008  . DEPRESSION 07/19/2008  . DIABETES MELLITUS, TYPE II 01/18/2008  . GERD 07/19/2008  . HYPERLIPIDEMIA 07/19/2008  . HYPERTENSION 07/31/2009  . Increased prostate specific antigen (PSA) velocity 09/13/2010  . Unspecified Peripheral Vascular Disease 01/18/2008  . PERSONAL HX COLONIC POLYPS  12/10/2007   Past Surgical History  Procedure Date  . Hernia surgury     60 years old  . Perineal abscess   . Sp endo repair infraren aaa   . S/p right and left popliteal aneurysm repair   . 7 abdominal hernia repairs 05/31/10  . Colonoscopy   . Polypectomy     reports that he quit smoking about 19 years ago. His smoking use included Cigarettes. He has never used smokeless tobacco. He reports that he drinks about 1.2 ounces of alcohol per week. He reports that he does not use illicit drugs. family history includes Aortic aneurysm in his mother and Cancer in his brother. Allergies  Allergen Reactions  . Latex Rash   Review of Systems Review of Systems  Constitutional: Negative for diaphoresis, activity change, appetite change and unexpected weight change.  HENT: Negative for hearing loss, ear pain, facial swelling, mouth sores and neck stiffness.   Eyes: Negative for pain, redness and visual disturbance.  Respiratory: Negative for shortness of breath and wheezing.   Cardiovascular: Negative for chest pain and palpitations.  Gastrointestinal: Negative for diarrhea, blood in stool, abdominal distention and rectal pain.  Genitourinary: Negative for hematuria, flank pain and decreased urine volume.  Musculoskeletal: Negative for myalgias and joint swelling.  Skin: Negative for color change and wound.  Neurological: Negative for syncope and numbness.  Hematological: Negative for adenopathy.  Psychiatric/Behavioral: Negative for hallucinations, self-injury, decreased concentration and agitation.      Objective:   Physical Exam  Assessment & Plan:

## 2011-04-22 NOTE — Assessment & Plan Note (Signed)

## 2011-04-22 NOTE — Procedures (Unsigned)
BYPASS GRAFT EVALUATION  INDICATION:  Follow up left popliteal graft.  HISTORY: Diabetes:  No. Cardiac:  No. Hypertension:  Yes. Smoking:  Previous. Previous Surgery:  Bilateral popliteal aneurysm repair; AAA repair.  SINGLE LEVEL ARTERIAL EXAM                              RIGHT              LEFT Brachial: Anterior tibial: Posterior tibial: Peroneal: Ankle/brachial index:        1.12               1.18  PREVIOUS ABI:  Date: 01/14/11  RIGHT:  1.16  LEFT:  0.95  LOWER EXTREMITY BYPASS GRAFT DUPLEX EXAM:  DUPLEX: 1. Technically difficult study due to graft location, scar tissue, and     acoustic shadow. 2. Patent left popliteal bypass graft and stent with moderate diffuse     disease in the native superficial femoral arterial inflow resulting     in approximately 50% stenosis. 3. There are significantly elevated velocities in the proximal area of     the graft; unable to determine if this area is at the anastomosis,     the stent, or the graft itself.  IMPRESSION:  Patent left popliteal bypass graft and stent with moderate diffuse inflow disease and elevated velocities, as described above.  ___________________________________________ V. Charlena Cross, MD  LT/MEDQ  D:  04/15/2011  T:  04/15/2011  Job:  [id number]

## 2011-04-22 NOTE — Patient Instructions (Addendum)
Please check with insurance regarding coverage for the shingles shot; and make nurse appt if covered to get the shot. Your refills were sent to [facility] Continue all other medications as before Please return in 6 mo with Lab testing done 3-5 days before

## 2011-07-05 ENCOUNTER — Encounter: Payer: Self-pay | Admitting: Surgery

## 2011-07-08 ENCOUNTER — Other Ambulatory Visit (INDEPENDENT_AMBULATORY_CARE_PROVIDER_SITE_OTHER): Payer: No Typology Code available for payment source | Admitting: *Deleted

## 2011-07-08 ENCOUNTER — Encounter: Payer: Self-pay | Admitting: Surgery

## 2011-07-08 ENCOUNTER — Ambulatory Visit (INDEPENDENT_AMBULATORY_CARE_PROVIDER_SITE_OTHER): Payer: No Typology Code available for payment source | Admitting: Surgery

## 2011-07-08 ENCOUNTER — Encounter (INDEPENDENT_AMBULATORY_CARE_PROVIDER_SITE_OTHER): Payer: No Typology Code available for payment source | Admitting: *Deleted

## 2011-07-08 VITALS — BP 143/78 | HR 75 | Resp 16 | Ht 70.0 in | Wt 242.0 lb

## 2011-07-08 DIAGNOSIS — I739 Peripheral vascular disease, unspecified: Secondary | ICD-10-CM

## 2011-07-08 DIAGNOSIS — I6529 Occlusion and stenosis of unspecified carotid artery: Secondary | ICD-10-CM

## 2011-07-08 DIAGNOSIS — Z48812 Encounter for surgical aftercare following surgery on the circulatory system: Secondary | ICD-10-CM

## 2011-07-08 NOTE — Progress Notes (Signed)
[facility] [place]   Patient name: David [name] MRN: [id number] DOB: 1950-10-06 Sex: male     Chief Complaint  Patient presents with  . Follow-up    Discuss findings of vasc studies  with /Labs today    HISTORY OF PRESENT ILLNESS: The patient is back today for followup. He is status post endovascular repair of a right popliteal aneurysm using a Viabond device. He also underwent open aneurysm repair and ultimately left popliteal aneurysm repair using an interposition reversed saphenous vein graft to the above-knee to below-knee popliteal artery. The proximal anastomosis developed a stenosis and he ultimately underwent stenting of the proximal anastomosis. Back today for followup. He has no complaints at this time. He denies symptoms of claudication.  Past Medical History  Diagnosis Date  . Diverticulosis   . Hemorrhoids   . Hx of adenomatous colonic polyps   . Hyperlipidemia   . Hypertension   . CVA (cerebral vascular accident) 2003  . Anticardiolipin antibody positive   . Perineal abscess   . Diabetes mellitus     type II  . GERD (gastroesophageal reflux disease)   . Peripheral vascular disease     peripheral vascular disease/carotid artery <100%  . Anxiety   . Depression   . 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 CEREBROVASCULAR ACCIDENT, HX OF 07/19/2008  . ANXIETY 07/19/2008  . DEPRESSION 07/19/2008  . DIABETES MELLITUS, TYPE II 01/18/2008  . GERD 07/19/2008  . HYPERLIPIDEMIA 07/19/2008  . HYPERTENSION 07/31/2009  . Increased prostate specific antigen (PSA) velocity 09/13/2010  . Unspecified Peripheral Vascular Disease 01/18/2008  . PERSONAL HX COLONIC POLYPS 12/10/2007    Past Surgical History  Procedure Date  . Hernia surgury     61 years old  . Perineal abscess   . Sp endo repair infraren aaa   . S/p right and left popliteal aneurysm repair   . 7 abdominal hernia repairs 05/31/10  . Colonoscopy   . Polypectomy     History    Social History  . Marital Status: Married    Spouse Name: N/A    Number of Children: 1  . Years of Education: N/A   Occupational History  . dsiabled due to stroke    Social History Main Topics  . Smoking status: Former Smoker    Types: Cigarettes    Quit date: 01/14/1992  . Smokeless tobacco: Never Used  . Alcohol Use: 1.2 oz/week    2 Glasses of wine per week     wine daily  . Drug Use: No  . Sexually Active: Not on file   Other Topics Concern  . Not on file   Social History Narrative   2 cups of coffee in the morning and tea throughout the day.    Family History  Problem Relation Age of Onset  . Aortic aneurysm Mother     desceding aoritic aneurysm  . Cancer Brother     Lung cancer    Allergies as of 07/08/2011 - Review Complete 07/08/2011  Allergen Reaction Noted  . Latex Rash 08/03/2010    Current Outpatient Prescriptions on File Prior to Visit  Medication Sig Dispense Refill  . ascorbic Acid (VITAMIN C CR) 500 MG CPCR Take 500 mg by mouth 2 (two) times daily.        Marland Kitchen aspirin 81 MG tablet Take 81 mg by mouth daily.        Marland Kitchen atorvastatin (LIPITOR) 40 MG tablet Take 1 tablet (40  mg total) by mouth daily.  90 tablet  3  . cetirizine (ZYRTEC) 10 MG tablet Take 10 mg by mouth daily.        . clopidogrel (PLAVIX) 75 MG tablet Take 1 tablet (75 mg total) by mouth daily.  90 tablet  3  . docusate sodium (COLACE) 100 MG capsule Take 100 mg by mouth 2 (two) times daily.        . Fiber POWD Take by mouth daily.        . folic acid (FOLVITE) 1 MG tablet Take 1 tablet (1 mg total) by mouth daily.  90 tablet  3  . gemfibrozil (LOPID) 600 MG tablet Take 1 tablet (600 mg total) by mouth 2 (two) times daily.  180 tablet  3  . mesalamine (CANASA) 1000 MG suppository Place 1 suppository (1,000 mg total) rectally 2 (two) times daily as needed. One suppository into rectum two times a day as needed  60 suppository  5  . Multiple Vitamin (MULTIVITAMIN) capsule Take 1 capsule by  mouth daily.        Marland Kitchen olmesartan-hydrochlorothiazide (BENICAR HCT) 40-25 MG per tablet Take 1 tablet by mouth daily.  90 tablet  3  . Omega-3 Fatty Acids (FISH OIL PO) Take by mouth daily.        . ranitidine (ZANTAC) 150 MG tablet Take 1 tablet (150 mg total) by mouth 2 (two) times daily.  60 tablet  11     REVIEW OF SYSTEMS: No changes from prior visit  PHYSICAL EXAMINATION:   Vital signs are BP 143/78  Pulse 75  Resp 16  Ht 5\' 10"  (1.778 m)  Wt 242 lb (109.77 kg)  BMI 34.72 kg/m2  SpO2 99% General: The patient appears their stated age. HEENT:  No gross abnormalities Pulmonary:  Non labored breathing Abdomen: Soft and non-tender no hernia Musculoskeletal: There are no major deformities. Neurologic: No focal weakness or paresthesias are detected, Skin: There are no ulcer or rashes noted. Psychiatric: The patient has normal affect. Cardiovascular: Palpable pedal pulses bilaterally   Diagnostic Studies Ultrasound today shows a slight decrease in the ankle brachial index on the left. 3 months ago he was 1.1 today he is 0.8. There is still triphasic waveforms. The right side is unchanged. Duplex of the left leg shows no elevation 10 velocity around his bypass graft.  Assessment: Bilateral popliteal aneurysm and abdominal aneurysm, status post repair Plan: The patient remained asymptomatic and is doing very well. Ultrasound does suggest that there may be a problem in the left leg however there are no velocity elevations within his bypass graft. I recommended we repeat his surveillance ultrasound in 3 months with followup after his ultrasound with me. If there is any change in his ultrasound findings I would proceed with angiogram to further evaluate this.  Jorge Ny, M.D. [facility] [place] Office: [phone] Pager:  [phone]

## 2011-07-15 ENCOUNTER — Encounter: Payer: Self-pay | Admitting: Surgery

## 2011-07-15 ENCOUNTER — Other Ambulatory Visit: Payer: Self-pay | Admitting: *Deleted

## 2011-07-15 DIAGNOSIS — I739 Peripheral vascular disease, unspecified: Secondary | ICD-10-CM

## 2011-07-15 DIAGNOSIS — Z48812 Encounter for surgical aftercare following surgery on the circulatory system: Secondary | ICD-10-CM

## 2011-07-15 NOTE — Procedures (Unsigned)
BYPASS GRAFT EVALUATION  INDICATION:  Left above-knee-to-below-knee popliteal graft placed 02/14/2009 with angioplasty and stent placed 08/17/2010.  HISTORY: Diabetes:  No. Cardiac: Hypertension:  Yes. Smoking:  Previous. Previous Surgery:  Right popliteal stent 12/06/2008; AAA repair 12/13/2008.  SINGLE LEVEL ARTERIAL EXAM                              RIGHT              LEFT Brachial: Anterior tibial: Posterior tibial: Peroneal: Ankle/brachial index:        1.0                0.81  PREVIOUS ABI:  Date:  04/15/2011  RIGHT:  1.12  LEFT:  1.18  LOWER EXTREMITY BYPASS GRAFT DUPLEX EXAM:  DUPLEX: 1. Technically difficult study due to scar tissue, location, and     acoustic shadow. 2. There is diffuse plaque in the native superficial femoral artery     with mild focal stenoses at the proximal and mid segments. 3. Velocities in the area of the stent/proximal anastomosis have     decreased since previous exam, but still indicate a possible     stenosis.  Area is difficult to visualize. 4. Multiple collaterals are observed originating just proximal to the     stent.  IMPRESSION: 1. Patent left above-knee-to-below-knee popliteal graft with mild     diffuse native superficial femoral artery disease and elevated     velocities in the proximal stent as described above. 2. Please see attached diagram for details.  ___________________________________________ V. Charlena Cross, MD  LT/MEDQ  D:  07/09/2011  T:  07/09/2011  Job:  [id number]

## 2011-07-15 NOTE — Procedures (Unsigned)
CAROTID DUPLEX EXAM  INDICATION:  Follow up right carotid disease; known occlusion of the left ICA.  HISTORY: Diabetes:  No. Cardiac: Hypertension:  Yes. Smoking:  Previous. Previous Surgery:  AAA and bilateral lower extremity popliteal aneurysm repairs. CV History: Amaurosis Fugax No, Paresthesias No, Hemiparesis No                                      RIGHT             LEFT Brachial systolic pressure:         135               148 Brachial Doppler waveforms:         WNL               WNL Vertebral direction of flow:        Antegrade DUPLEX VELOCITIES (cm/sec) CCA peak systolic                   73 ECA peak systolic                   178 ICA peak systolic                   85 ICA end diastolic                   35 PLAQUE MORPHOLOGY:                  Heterogeneous PLAQUE AMOUNT:                      Mild PLAQUE LOCATION:                    ICA  IMPRESSION: 1. 1-39% right internal carotid artery stenosis. 2. Right external carotid artery and vertebral artery are within     normal limits. 3. Left side was not evaluated due to known occlusion.  ___________________________________________ V. Charlena Cross, MD  LT/MEDQ  D:  07/09/2011  T:  07/09/2011  Job:  [id number]

## 2011-10-07 ENCOUNTER — Ambulatory Visit: Payer: No Typology Code available for payment source | Admitting: Surgery

## 2011-10-23 ENCOUNTER — Encounter: Payer: No Typology Code available for payment source | Admitting: Internal Medicine

## 2011-11-01 ENCOUNTER — Encounter: Payer: Self-pay | Admitting: Surgery

## 2011-11-04 ENCOUNTER — Ambulatory Visit: Payer: No Typology Code available for payment source | Admitting: Surgery

## 2011-11-08 ENCOUNTER — Encounter: Payer: Self-pay | Admitting: Neurosurgery

## 2011-11-11 ENCOUNTER — Encounter (INDEPENDENT_AMBULATORY_CARE_PROVIDER_SITE_OTHER): Payer: No Typology Code available for payment source | Admitting: *Deleted

## 2011-11-11 ENCOUNTER — Encounter: Payer: Self-pay | Admitting: Neurosurgery

## 2011-11-11 ENCOUNTER — Ambulatory Visit (INDEPENDENT_AMBULATORY_CARE_PROVIDER_SITE_OTHER): Payer: No Typology Code available for payment source | Admitting: Neurosurgery

## 2011-11-11 VITALS — BP 135/86 | HR 72 | Resp 16 | Ht 70.0 in | Wt 242.2 lb

## 2011-11-11 DIAGNOSIS — I724 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: Secondary | ICD-10-CM

## 2011-11-11 DIAGNOSIS — I739 Peripheral vascular disease, unspecified: Secondary | ICD-10-CM

## 2011-11-11 DIAGNOSIS — Z48812 Encounter for surgical aftercare following surgery on the circulatory system: Secondary | ICD-10-CM

## 2011-11-11 DIAGNOSIS — I6529 Occlusion and stenosis of unspecified carotid artery: Secondary | ICD-10-CM

## 2011-11-11 NOTE — Progress Notes (Signed)
[facility] [place] PAD/PVD Office Note  CC: Three-month followup for duplex bypass graft scan Referring Physician: Myra Gianotti  History of Present Illness: 61 year old male patient of Dr. Myra Gianotti who status post a AAA repair in August of 2010, left above-the-knee to below the knee popliteal bypass graft in October 2010 with resulting angiogram stent in April 2012, the patient is also had a right popliteal stent in August 2010. Today the patient denies any claudication, rest pain or open ulcerations of the lower extremities. The patient is able to ambulate without difficulty.  Past Medical History  Diagnosis Date  . Diverticulosis   . Hemorrhoids   . Hx of adenomatous colonic polyps   . Hyperlipidemia   . Hypertension   . CVA (cerebral vascular accident) 2003  . Anticardiolipin antibody positive   . Perineal abscess   . Diabetes mellitus     type II  . GERD (gastroesophageal reflux disease)   . Peripheral vascular disease     peripheral vascular disease/carotid artery <100%  . Anxiety   . Depression   . 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 CEREBROVASCULAR ACCIDENT, HX OF 07/19/2008  . ANXIETY 07/19/2008  . DEPRESSION 07/19/2008  . DIABETES MELLITUS, TYPE II 01/18/2008  . GERD 07/19/2008  . HYPERLIPIDEMIA 07/19/2008  . HYPERTENSION 07/31/2009  . Increased prostate specific antigen (PSA) velocity 09/13/2010  . Unspecified Peripheral Vascular Disease 01/18/2008  . PERSONAL HX COLONIC POLYPS 12/10/2007  . AAA (abdominal aortic aneurysm)     ROS: [x]  Positive   [ ]  Denies    General: [ ]  Weight loss, [ ]  Fever, [ ]  chills Neurologic: [ ]  Dizziness, [ ]  Blackouts, [ ]  Seizure [ ]  Stroke, [ ]  "Mini stroke", [ ]  Slurred speech, [ ]  Temporary blindness; [ ]  weakness in arms or legs, [ ]  Hoarseness Cardiac: [ ]  Chest pain/pressure, [ ]  Shortness of breath at rest [ ]  Shortness of breath with exertion, [ ]  Atrial fibrillation or irregular heartbeat Vascular: [ ]   Pain in legs with walking, [ ]  Pain in legs at rest, [ ]  Pain in legs at night,  [ ]  Non-healing ulcer, [ ]  Blood clot in vein/DVT,   Pulmonary: [ ]  Home oxygen, [ ]  Productive cough, [ ]  Coughing up blood, [ ]  Asthma,  [ ]  Wheezing Musculoskeletal:  [ ]  Arthritis, [ ]  Low back pain, [ ]  Joint pain Hematologic: [ ]  Easy Bruising, [ ]  Anemia; [ ]  Hepatitis Gastrointestinal: [ ]  Blood in stool, [ ]  Gastroesophageal Reflux/heartburn, [ ]  Trouble swallowing Urinary: [ ]  chronic Kidney disease, [ ]  on HD - [ ]  MWF or [ ]  TTHS, [ ]  Burning with urination, [ ]  Difficulty urinating Skin: [ ]  Rashes, [ ]  Wounds Psychological: [ ]  Anxiety, [ ]  Depression   Social History History  Substance Use Topics  . Smoking status: Former Smoker    Types: Cigarettes    Quit date: 01/14/1992  . Smokeless tobacco: Never Used  . Alcohol Use: 1.2 oz/week    2 Glasses of wine per week     wine daily    Family History Family History  Problem Relation Age of Onset  . Aortic aneurysm Mother     desceding aoritic aneurysm  . Cancer Brother     Lung cancer    Allergies  Allergen Reactions  . Latex Rash    Current Outpatient Prescriptions  Medication Sig Dispense Refill  . ascorbic Acid (VITAMIN C CR) 500 MG CPCR  Take 500 mg by mouth 2 (two) times daily.        Marland Kitchen aspirin 81 MG tablet Take 81 mg by mouth daily.        Marland Kitchen atorvastatin (LIPITOR) 40 MG tablet Take 1 tablet (40 mg total) by mouth daily.  90 tablet  3  . cetirizine (ZYRTEC) 10 MG tablet Take 10 mg by mouth daily.        . clopidogrel (PLAVIX) 75 MG tablet Take 1 tablet (75 mg total) by mouth daily.  90 tablet  3  . desoximetasone (TOPICORT) 0.25 % cream Apply 0.25 % topically as needed.      . docusate sodium (COLACE) 100 MG capsule Take 100 mg by mouth 2 (two) times daily.        . Fiber POWD Take by mouth daily.        . fluocinonide (LIDEX) 0.05 % external solution Apply 60 g topically as needed.      . folic acid (FOLVITE) 1 MG tablet  Take 1 tablet (1 mg total) by mouth daily.  90 tablet  3  . gemfibrozil (LOPID) 600 MG tablet Take 1 tablet (600 mg total) by mouth 2 (two) times daily.  180 tablet  3  . mesalamine (CANASA) 1000 MG suppository Place 1 suppository (1,000 mg total) rectally 2 (two) times daily as needed. One suppository into rectum two times a day as needed  60 suppository  5  . Multiple Vitamin (MULTIVITAMIN) capsule Take 1 capsule by mouth daily.        Marland Kitchen olmesartan-hydrochlorothiazide (BENICAR HCT) 40-25 MG per tablet Take 1 tablet by mouth daily.  90 tablet  3  . Omega-3 Fatty Acids (FISH OIL PO) Take by mouth daily.        . ranitidine (ZANTAC) 150 MG tablet Take 1 tablet (150 mg total) by mouth 2 (two) times daily.  60 tablet  11    Physical Examination  Filed Vitals:   11/11/11 1212  BP: 135/86  Pulse: 72  Resp: 16    Body mass index is 34.75 kg/(m^2).  General:  WDWN in NAD Gait: Normal HEENT: WNL Eyes: Pupils equal Pulmonary: normal non-labored breathing , without Rales, rhonchi,  wheezing Cardiac: RRR, without  Murmurs, rubs or gallops; No carotid bruits Abdomen: soft, NT, no masses Skin: no rashes, ulcers noted Vascular Exam/Pulses: Palpable femoral pulses and lower extremity pulses bilaterally, no carotid bruits are heard  Extremities without ischemic changes, no Gangrene , no cellulitis; no open wounds;  Musculoskeletal: no muscle wasting or atrophy  Neurologic: A&O X 3; Appropriate Affect ; SENSATION: normal; MOTOR FUNCTION:  moving all extremities equally. Speech is fluent/normal  Non-Invasive Vascular Imaging: ABIs today are 1.18 and triphasic on the right, 0.92 and triphasic to biphasic on the left, duplex bypass graft scan shows stable velocities in the left lower extremity.  ASSESSMENT/PLAN: Asymptomatic patient with a past history of peripheral vascular problems. The patient will followup in 3 months for a carotid study as well as lower extremity ABIs and duplex of the graft.  This will get him back on a schedule for surveillance. He is in agreement with this plan, his questions were encouraged and answered.   Lauree Chandler ANP  Clinic M.D.: Myra Gianotti

## 2011-11-18 NOTE — Procedures (Unsigned)
BYPASS GRAFT EVALUATION  INDICATION:  Left lower extremity graft with stent placed in the proximal segment.  HISTORY: Diabetes:  No Cardiac: Hypertension:  Yes Smoking:  Previous Previous Surgery:  Right popliteal aneurysm stent 12/06/2008; left popliteal aneurysm graft repair 02/14/2009 with angioplasty and stent placed 08/07/2010  SINGLE LEVEL ARTERIAL EXAM                              RIGHT              LEFT Brachial: Anterior tibial: Posterior tibial: Peroneal: Ankle/brachial index:  PREVIOUS ABI:  Date:  07/08/2011  RIGHT:  1.01  LEFT:  0.81  LOWER EXTREMITY BYPASS GRAFT DUPLEX EXAM:  DUPLEX:  Patent left lower extremity graft and stent with difficult visualization of the proximal segment due to scarring and location. There are elevated velocities in the stent which are essentially stable compared to previous exam. The remainder of the graft is unremarkable with triphasic waveforms.  IMPRESSION: 1. Patent left femoral-popliteal graft and stent of the proximal     segment with elevated velocities as described above. 2. Velocities may be elevated due to stent rigidity.  However, disease     cannot be ruled out due to difficult visualization.  ___________________________________________ V. Charlena Cross, MD  LT/MEDQ  D:  11/11/2011  T:  11/11/2011  Job:  [id number]

## 2011-11-20 ENCOUNTER — Other Ambulatory Visit (INDEPENDENT_AMBULATORY_CARE_PROVIDER_SITE_OTHER): Payer: No Typology Code available for payment source

## 2011-11-20 ENCOUNTER — Telehealth: Payer: Self-pay

## 2011-11-20 DIAGNOSIS — E119 Type 2 diabetes mellitus without complications: Secondary | ICD-10-CM

## 2011-11-20 LAB — BASIC METABOLIC PANEL
BUN: 20 mg/dL (ref 6–23)
Calcium: 10.3 mg/dL (ref 8.4–10.5)
GFR: 74.16 mL/min (ref >60.00)
Glucose, Bld: 266 mg/dL — ABNORMAL HIGH (ref 70–99)

## 2011-11-20 LAB — LIPID PANEL
HDL: 32.5 mg/dL — ABNORMAL LOW (ref >39.00)
Triglycerides: 294 mg/dL — ABNORMAL HIGH (ref 0.0–149.0)

## 2011-11-20 LAB — HEMOGLOBIN A1C: Hgb A1c MFr Bld: 7.4 % — ABNORMAL HIGH (ref 4.6–6.5)

## 2011-11-20 NOTE — Telephone Encounter (Signed)
Put order in for lab work.

## 2011-11-22 ENCOUNTER — Encounter: Payer: Self-pay | Admitting: Internal Medicine

## 2011-11-22 ENCOUNTER — Ambulatory Visit (INDEPENDENT_AMBULATORY_CARE_PROVIDER_SITE_OTHER): Payer: No Typology Code available for payment source | Admitting: Internal Medicine

## 2011-11-22 VITALS — BP 132/80 | HR 71 | Temp 97.5°F | Ht 70.0 in | Wt 243.5 lb

## 2011-11-22 DIAGNOSIS — Z2911 Encounter for prophylactic immunotherapy for respiratory syncytial virus (RSV): Secondary | ICD-10-CM

## 2011-11-22 DIAGNOSIS — F411 Generalized anxiety disorder: Secondary | ICD-10-CM

## 2011-11-22 DIAGNOSIS — Z0000 Encounter for general adult medical examination without abnormal findings: Secondary | ICD-10-CM

## 2011-11-22 DIAGNOSIS — I10 Essential (primary) hypertension: Secondary | ICD-10-CM

## 2011-11-22 DIAGNOSIS — Z136 Encounter for screening for cardiovascular disorders: Secondary | ICD-10-CM

## 2011-11-22 DIAGNOSIS — Z23 Encounter for immunization: Secondary | ICD-10-CM

## 2011-11-22 DIAGNOSIS — E119 Type 2 diabetes mellitus without complications: Secondary | ICD-10-CM

## 2011-11-22 DIAGNOSIS — E785 Hyperlipidemia, unspecified: Secondary | ICD-10-CM

## 2011-11-22 MED ORDER — GEMFIBROZIL 600 MG PO TABS: 600.0000 mg | ORAL_TABLET | Freq: Two times a day (BID) | ORAL | Status: DC

## 2011-11-22 MED ORDER — CLOPIDOGREL BISULFATE 75 MG PO TABS: 75.0000 mg | ORAL_TABLET | Freq: Every day | ORAL | Status: DC

## 2011-11-22 MED ORDER — ATORVASTATIN CALCIUM 40 MG PO TABS: 40.0000 mg | ORAL_TABLET | Freq: Every day | ORAL | Status: DC

## 2011-11-22 MED ORDER — OLMESARTAN MEDOXOMIL-HCTZ 40-25 MG PO TABS: 1.0000 | ORAL_TABLET | Freq: Every day | ORAL | Status: DC

## 2011-11-22 MED ORDER — FOLIC ACID 1 MG PO TABS: 1.0000 mg | ORAL_TABLET | Freq: Every day | ORAL | Status: DC

## 2011-11-22 NOTE — Assessment & Plan Note (Addendum)
ECG reviewed as per emr, stable overall by hx and exam, most recent data reviewed with pt, and pt to continue medical treatment as before BP Readings from Last 3 Encounters:  11/22/11 132/80  11/11/11 135/86  07/08/11 143/78

## 2011-11-22 NOTE — Patient Instructions (Addendum)
You had the shingles shot today Continue all other medications as before Your refills were done as reqeusted You are otherwise up to date with prevention Please keep your appointments with your specialists as you have planned Please return in 6 months, or sooner if needed, with Lab testing done 3-5 days before

## 2011-11-23 ENCOUNTER — Encounter: Payer: Self-pay | Admitting: Internal Medicine

## 2011-11-23 NOTE — Assessment & Plan Note (Signed)
stable overall by hx and exam, most recent data reviewed with pt, and pt to continue medical treatment as before Lab Results  Component Value Date   WBC 4.9 04/18/2011   HGB 14.4 04/18/2011   HCT 40.3 04/18/2011   PLT 208.0 04/18/2011   GLUCOSE 266* 11/20/2011   CHOL 133 11/20/2011   TRIG 294.0* 11/20/2011   HDL 32.50* 11/20/2011   LDLDIRECT 44.4 11/20/2011   LDLCALC 51 09/13/2010   ALT 39 04/18/2011   AST 45* 04/18/2011   NA 136 11/20/2011   K 4.5 11/20/2011   CL 100 11/20/2011   CREATININE 1.1 11/20/2011   BUN 20 11/20/2011   CO2 27 11/20/2011   TSH 1.22 04/18/2011   PSA 1.82 04/18/2011   INR 1.10 02/14/2009   HGBA1C 7.4* 11/20/2011   MICROALBUR 6.8* 02/13/2011

## 2011-11-23 NOTE — Assessment & Plan Note (Signed)
stable overall by hx and exam, most recent data reviewed with pt, and pt to continue medical treatment as before Lab Results  Component Value Date   LDLCALC 51 09/13/2010

## 2011-11-23 NOTE — Assessment & Plan Note (Signed)
stable overall by hx and exam, most recent data reviewed with pt, and pt to continue medical treatment as before Lab Results  Component Value Date   HGBA1C 7.4* 11/20/2011

## 2011-11-23 NOTE — Progress Notes (Signed)
Subjective:    Patient ID: David [name], male    DOB: Aug 08, 1950, 61 y.o.   MRN: [id number]  HPI  Here to f/u; overall doing ok,  Pt denies chest pain, increased sob or doe, wheezing, orthopnea, PND, increased LE swelling, palpitations, dizziness or syncope.  Pt denies new neurological symptoms such as new headache, or facial or extremity weakness or numbness   Pt denies polydipsia, polyuria, or low sugar symptoms such as weakness or confusion improved with po intake.  Pt states overall good compliance with meds, trying to follow lower cholesterol, diabetic diet, wt overall stable but little exercise however. Is s/p AAA 2010 with neg stress test, has q 3 mo f/u with vascular/Dr Madilyn Fireman.  Did gain several lbs recently.  Has also recurrent left knee pain with exercise but no swelling, giveaway, mild at worst, not gait changing.  Denies worsening depressive symptoms, suicidal ideation, or panic, though has ongoing anxiety, not increased recently.  Past Medical History  Diagnosis Date  . Diverticulosis   . Hemorrhoids   . Hx of adenomatous colonic polyps   . Hyperlipidemia   . Hypertension   . CVA (cerebral vascular accident) 2003  . Anticardiolipin antibody positive   . Perineal abscess   . Diabetes mellitus     type II  . GERD (gastroesophageal reflux disease)   . Peripheral vascular disease     peripheral vascular disease/carotid artery <100%  . Anxiety   . Depression   . 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 CEREBROVASCULAR ACCIDENT, HX OF 07/19/2008  . ANXIETY 07/19/2008  . DEPRESSION 07/19/2008  . DIABETES MELLITUS, TYPE II 01/18/2008  . GERD 07/19/2008  . HYPERLIPIDEMIA 07/19/2008  . HYPERTENSION 07/31/2009  . Increased prostate specific antigen (PSA) velocity 09/13/2010  . Unspecified Peripheral Vascular Disease 01/18/2008  . PERSONAL HX COLONIC POLYPS 12/10/2007  . AAA (abdominal aortic aneurysm)    Past Surgical History  Procedure Date  . Hernia surgury     61 years old    . Perineal abscess   . Sp endo repair infraren aaa   . S/p right and left popliteal aneurysm repair   . 7 abdominal hernia repairs 05/31/10  . Colonoscopy   . Polypectomy   . Abdominal aortic aneurysm repair     reports that he quit smoking about 19 years ago. His smoking use included Cigarettes. He has never used smokeless tobacco. He reports that he drinks about 1.2 ounces of alcohol per week. He reports that he does not use illicit drugs. family history includes Aortic aneurysm in his mother and Cancer in his brother. Allergies  Allergen Reactions  . Latex Rash   Current Outpatient Prescriptions on File Prior to Visit  Medication Sig Dispense Refill  . ascorbic Acid (VITAMIN C CR) 500 MG CPCR Take 500 mg by mouth 2 (two) times daily.        Marland Kitchen aspirin 81 MG tablet Take 81 mg by mouth daily.        Marland Kitchen atorvastatin (LIPITOR) 40 MG tablet Take 1 tablet (40 mg total) by mouth daily.  90 tablet  3  . cetirizine (ZYRTEC) 10 MG tablet Take 10 mg by mouth daily.        Marland Kitchen desoximetasone (TOPICORT) 0.25 % cream Apply 0.25 % topically as needed.      . docusate sodium (COLACE) 100 MG capsule Take 100 mg by mouth 2 (two) times daily.        . Fiber POWD Take by  mouth daily.        . fluocinonide (LIDEX) 0.05 % external solution Apply 60 g topically as needed.      Marland Kitchen gemfibrozil (LOPID) 600 MG tablet Take 1 tablet (600 mg total) by mouth 2 (two) times daily.  180 tablet  3  . mesalamine (CANASA) 1000 MG suppository Place 1 suppository (1,000 mg total) rectally 2 (two) times daily as needed. One suppository into rectum two times a day as needed  60 suppository  5  . Multiple Vitamin (MULTIVITAMIN) capsule Take 1 capsule by mouth daily.        Marland Kitchen olmesartan-hydrochlorothiazide (BENICAR HCT) 40-25 MG per tablet Take 1 tablet by mouth daily.  90 tablet  3  . Omega-3 Fatty Acids (FISH OIL PO) Take by mouth daily.        . ranitidine (ZANTAC) 150 MG tablet Take 1 tablet (150 mg total) by mouth 2 (two)  times daily.  60 tablet  11   Review of Systems Review of Systems  Constitutional: Negative for diaphoresis and unexpected weight change.  HENT: Negative for tinnitus.   Eyes: Negative for photophobia and visual disturbance.  Respiratory: Negative for choking and stridor.   Gastrointestinal: Negative for vomiting and blood in stool.  Genitourinary: Negative for hematuria and decreased urine volume.  Musculoskeletal: Negative for gait problem.  Skin: Negative for color change and wound.  Neurological: Negative for tremors and numbness.    Objective:   Physical Exam BP 132/80  Pulse 71  Temp 97.5 F (36.4 C) (Oral)  Ht 5\' 10"  (1.778 m)  Wt 243 lb 8 oz (110.451 kg)  BMI 34.94 kg/m2  SpO2 97% Physical Exam  VS noted Constitutional: Pt appears well-developed and well-nourished.  HENT: Head: Normocephalic.  Right Ear: External ear normal.  Left Ear: External ear normal.  Eyes: Conjunctivae and EOM are normal. Pupils are equal, round, and reactive to light.  Neck: Normal range of motion. Neck supple.  Cardiovascular: Normal rate and regular rhythm.   Pulmonary/Chest: Effort normal and breath sounds normal.  Abd:  Soft, NT, non-distended, + BS Neurological: Pt is alert. Not confused Skin: Skin is warm. No erythema.  Psychiatric: Pt behavior is normal. Thought content normal.    Assessment & Plan:

## 2011-12-02 ENCOUNTER — Other Ambulatory Visit: Payer: Self-pay | Admitting: Gastroenterology

## 2011-12-02 ENCOUNTER — Ambulatory Visit: Payer: No Typology Code available for payment source | Admitting: Surgery

## 2011-12-02 MED ORDER — MESALAMINE 1000 MG RE SUPP: 1000.0000 mg | Freq: Two times a day (BID) | RECTAL | Status: DC | PRN

## 2011-12-02 NOTE — Telephone Encounter (Signed)
OK for Canasa pr refill REV is symptoms persist

## 2011-12-02 NOTE — Telephone Encounter (Signed)
Refill of prior rx sent.  Patient aware to make an appt if his symptoms persist

## 2011-12-02 NOTE — Telephone Encounter (Signed)
Patient reports hemorrhoidal bleeding.  In the past you have given him canasa suppositories.  He is requesting a refill the last was 06/2010.  Dr. Russella Dar is it ok to send in Canasa?

## 2011-12-02 NOTE — Telephone Encounter (Signed)
Patient would like a prescription for Canasa suppositories.

## 2012-01-13 ENCOUNTER — Other Ambulatory Visit: Payer: No Typology Code available for payment source

## 2012-01-13 ENCOUNTER — Ambulatory Visit: Payer: No Typology Code available for payment source | Admitting: Surgery

## 2012-01-15 ENCOUNTER — Other Ambulatory Visit: Payer: No Typology Code available for payment source

## 2012-01-20 ENCOUNTER — Ambulatory Visit: Payer: PRIVATE HEALTH INSURANCE | Admitting: Surgery

## 2012-01-20 ENCOUNTER — Other Ambulatory Visit: Payer: PRIVATE HEALTH INSURANCE

## 2012-02-11 ENCOUNTER — Ambulatory Visit: Payer: No Typology Code available for payment source | Admitting: Neurosurgery

## 2012-02-11 ENCOUNTER — Other Ambulatory Visit: Payer: No Typology Code available for payment source

## 2012-03-13 ENCOUNTER — Encounter: Payer: Self-pay | Admitting: Neurosurgery

## 2012-03-16 ENCOUNTER — Encounter: Payer: Self-pay | Admitting: Neurosurgery

## 2012-03-16 ENCOUNTER — Encounter (INDEPENDENT_AMBULATORY_CARE_PROVIDER_SITE_OTHER): Payer: No Typology Code available for payment source | Admitting: *Deleted

## 2012-03-16 ENCOUNTER — Other Ambulatory Visit (INDEPENDENT_AMBULATORY_CARE_PROVIDER_SITE_OTHER): Payer: No Typology Code available for payment source | Admitting: *Deleted

## 2012-03-16 ENCOUNTER — Ambulatory Visit (INDEPENDENT_AMBULATORY_CARE_PROVIDER_SITE_OTHER): Payer: No Typology Code available for payment source | Admitting: Neurosurgery

## 2012-03-16 VITALS — BP 152/95 | HR 64 | Resp 16 | Ht 70.0 in | Wt 235.0 lb

## 2012-03-16 DIAGNOSIS — I739 Peripheral vascular disease, unspecified: Secondary | ICD-10-CM

## 2012-03-16 DIAGNOSIS — I6529 Occlusion and stenosis of unspecified carotid artery: Secondary | ICD-10-CM

## 2012-03-16 DIAGNOSIS — Z48812 Encounter for surgical aftercare following surgery on the circulatory system: Secondary | ICD-10-CM

## 2012-03-16 NOTE — Progress Notes (Signed)
[facility] [place] PAD/PVD Office Note  CC: PVD and carotid surveillance Referring Physician: Brabham  History of Present Illness: 61 year old male patient of Dr. Myra Gianotti status post a right popliteal stent in 2010 and as well as a left above-the-knee to below the knee popliteal bypass graft in also in 2010 with an angiogram and stent in 2012. The patient also status post AAA repair in 2010 and has a known left ICA occlusion. The patient denies claudication, rest pain, any signs or symptoms of CVA, TIA, amaurosis fugax or any neural deficit.  Past Medical History  Diagnosis Date  . Diverticulosis   . Hemorrhoids   . Hx of adenomatous colonic polyps   . Hyperlipidemia   . Hypertension   . CVA (cerebral vascular accident) 2003  . Anticardiolipin antibody positive   . Perineal abscess   . Diabetes mellitus     type II  . GERD (gastroesophageal reflux disease)   . Peripheral vascular disease     peripheral vascular disease/carotid artery <100%  . Anxiety   . Depression   . 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 CEREBROVASCULAR ACCIDENT, HX OF 07/19/2008  . ANXIETY 07/19/2008  . DEPRESSION 07/19/2008  . DIABETES MELLITUS, TYPE II 01/18/2008  . GERD 07/19/2008  . HYPERLIPIDEMIA 07/19/2008  . HYPERTENSION 07/31/2009  . Increased prostate specific antigen (PSA) velocity 09/13/2010  . Unspecified Peripheral Vascular Disease 01/18/2008  . PERSONAL HX COLONIC POLYPS 12/10/2007  . AAA (abdominal aortic aneurysm)     ROS: [x]  Positive   [ ]  Denies    General: [ ]  Weight loss, [ ]  Fever, [ ]  chills Neurologic: [ ]  Dizziness, [ ]  Blackouts, [ ]  Seizure [ ]  Stroke, [ ]  "Mini stroke", [ ]  Slurred speech, [ ]  Temporary blindness; [ ]  weakness in arms or legs, [ ]  Hoarseness Cardiac: [ ]  Chest pain/pressure, [ ]  Shortness of breath at rest [ ]  Shortness of breath with exertion, [ ]  Atrial fibrillation or irregular heartbeat Vascular: [ ]  Pain in legs with walking, [ ]   Pain in legs at rest, [ ]  Pain in legs at night,  [ ]  Non-healing ulcer, [ ]  Blood clot in vein/DVT,   Pulmonary: [ ]  Home oxygen, [ ]  Productive cough, [ ]  Coughing up blood, [ ]  Asthma,  [ ]  Wheezing Musculoskeletal:  [ ]  Arthritis, [ ]  Low back pain, [ ]  Joint pain Hematologic: [ ]  Easy Bruising, [ ]  Anemia; [ ]  Hepatitis Gastrointestinal: [ ]  Blood in stool, [ ]  Gastroesophageal Reflux/heartburn, [ ]  Trouble swallowing Urinary: [ ]  chronic Kidney disease, [ ]  on HD - [ ]  MWF or [ ]  TTHS, [ ]  Burning with urination, [ ]  Difficulty urinating Skin: [ ]  Rashes, [ ]  Wounds Psychological: [ ]  Anxiety, [ ]  Depression   Social History History  Substance Use Topics  . Smoking status: Former Smoker    Types: Cigarettes    Quit date: 01/14/1992  . Smokeless tobacco: Never Used  . Alcohol Use: 1.2 oz/week    2 Glasses of wine per week     Comment: wine daily    Family History Family History  Problem Relation Age of Onset  . Aortic aneurysm Mother     desceding aoritic aneurysm  . Cancer Brother     Lung cancer    Allergies  Allergen Reactions  . Latex Rash    Current Outpatient Prescriptions  Medication Sig Dispense Refill  . ascorbic Acid (VITAMIN C CR) 500  MG CPCR Take 500 mg by mouth 2 (two) times daily.        Marland Kitchen aspirin 81 MG tablet Take 81 mg by mouth daily.        Marland Kitchen atorvastatin (LIPITOR) 40 MG tablet Take 1 tablet (40 mg total) by mouth daily.  90 tablet  3  . cetirizine (ZYRTEC) 10 MG tablet Take 10 mg by mouth daily.        . clopidogrel (PLAVIX) 75 MG tablet Take 1 tablet (75 mg total) by mouth daily.  90 tablet  3  . desoximetasone (TOPICORT) 0.25 % cream Apply 0.25 % topically as needed.      . docusate sodium (COLACE) 100 MG capsule Take 100 mg by mouth 2 (two) times daily.        . Fiber POWD Take by mouth daily.        . fluocinonide (LIDEX) 0.05 % external solution Apply 60 g topically as needed.      . folic acid (FOLVITE) 1 MG tablet Take 1 tablet (1 mg  total) by mouth daily.  90 tablet  3  . gemfibrozil (LOPID) 600 MG tablet Take 1 tablet (600 mg total) by mouth 2 (two) times daily.  180 tablet  3  . mesalamine (CANASA) 1000 MG suppository Place 1 suppository (1,000 mg total) rectally 2 (two) times daily as needed. One suppository into rectum two times a day as needed  60 suppository  0  . Multiple Vitamin (MULTIVITAMIN) capsule Take 1 capsule by mouth daily.        Marland Kitchen olmesartan-hydrochlorothiazide (BENICAR HCT) 40-25 MG per tablet Take 1 tablet by mouth daily.  90 tablet  3  . Omega-3 Fatty Acids (FISH OIL PO) Take by mouth daily.        . ranitidine (ZANTAC) 150 MG tablet Take 1 tablet (150 mg total) by mouth 2 (two) times daily.  60 tablet  11    Physical Examination  Filed Vitals:   03/16/12 1223  BP: 152/95  Pulse: 64  Resp:     Body mass index is 33.72 kg/(m^2).  General:  WDWN in NAD Gait: Normal HEENT: WNL Eyes: Pupils equal Pulmonary: normal non-labored breathing , without Rales, rhonchi,  wheezing Cardiac: RRR, without  Murmurs, rubs or gallops; No carotid bruits Abdomen: soft, NT, no masses Skin: no rashes, ulcers noted Vascular Exam/Pulses: Palpable lower extremity pulses bilaterally, 3+ radial pulses bilaterally, carotid pulses to auscultation no bruits are heard  Extremities without ischemic changes, no Gangrene , no cellulitis; no open wounds;  Musculoskeletal: no muscle wasting or atrophy  Neurologic: A&O X 3; Appropriate Affect ; SENSATION: normal; MOTOR FUNCTION:  moving all extremities equally. Speech is fluent/normal  Non-Invasive Vascular Imaging: Carotid duplex today shows a stable 1-39% ICA stenosis on the right with a known left occlusion, ABIs today are 1.16 on the right and triphasic, 0.97 and biphasic to monophasic on the left with a patent right SFA stent, and a patent left bypass with some elevated velocities at the proximal anastomosis. This was reviewed with Dr. Myra Gianotti who recommends the patient  return in 3 months for repeat lower extremity duplex and ABIs.  ASSESSMENT/PLAN: Assessment as above, the patient will return in one year for repeat carotid duplex, 3 months for repeat ABIs and lower extremity duplex of his bypass graft and stent. The patient will followup with Dr. Myra Gianotti at that time. The patient's questions were encouraged and answered, he is in agreement with this plan.  Lauree Chandler  ANP  Clinic M.D.: Myra Gianotti

## 2012-03-18 NOTE — Addendum Note (Signed)
Addended by: Sharee Pimple on: 03/18/2012 09:38 AM   Modules accepted: Orders

## 2012-05-27 ENCOUNTER — Ambulatory Visit: Payer: No Typology Code available for payment source | Admitting: Internal Medicine

## 2012-06-12 ENCOUNTER — Encounter: Payer: Self-pay | Admitting: Surgery

## 2012-06-15 ENCOUNTER — Other Ambulatory Visit: Payer: Self-pay | Admitting: *Deleted

## 2012-06-15 ENCOUNTER — Ambulatory Visit: Payer: No Typology Code available for payment source | Admitting: Surgery

## 2012-06-15 ENCOUNTER — Encounter: Payer: Self-pay | Admitting: Surgery

## 2012-06-15 ENCOUNTER — Ambulatory Visit (INDEPENDENT_AMBULATORY_CARE_PROVIDER_SITE_OTHER): Payer: No Typology Code available for payment source | Admitting: Surgery

## 2012-06-15 ENCOUNTER — Encounter (INDEPENDENT_AMBULATORY_CARE_PROVIDER_SITE_OTHER): Payer: No Typology Code available for payment source | Admitting: *Deleted

## 2012-06-15 VITALS — BP 141/85 | HR 78 | Ht 70.0 in | Wt 243.7 lb

## 2012-06-15 DIAGNOSIS — I739 Peripheral vascular disease, unspecified: Secondary | ICD-10-CM

## 2012-06-15 DIAGNOSIS — I714 Abdominal aortic aneurysm, without rupture: Secondary | ICD-10-CM

## 2012-06-15 DIAGNOSIS — Z48812 Encounter for surgical aftercare following surgery on the circulatory system: Secondary | ICD-10-CM

## 2012-06-15 NOTE — Progress Notes (Signed)
[facility] [place]   Patient name: David [name] MRN: [id number] DOB: 1951-01-18 Sex: male     Chief Complaint  Patient presents with  . Re-evaluation    3 month f/u     HISTORY OF PRESENT ILLNESS: The patient comes back today for followup. He has undergone open abdominal aortic aneurysm repair by Dr. Madilyn Fireman in 2012. 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 He had his left popliteal aneurysm repaired by above-knee to below-knee bypass in October of 2010 he's had a midline abdominal hernia repair by Dr. gross. He had elevated velocities in the proximal anastomosis of his bypass graft and underwent arteriogram which revealed a high-grade stenosis extending into his bypass graft balloon angioplasty was performed however this resulted in a dissection and he therefore underwent stenting using a 6 mm stent he is back today for followup. We have been following velocity elevations at the stent on the left leg. He reports no symptoms of claudication. He reports no ulceration. He does complain of right greater than left leg swelling. He continues to wear his compression stockings  Past Medical History  Diagnosis Date  . Diverticulosis   . Hemorrhoids   . Hx of adenomatous colonic polyps   . Hyperlipidemia   . Hypertension   . CVA (cerebral vascular accident) 2003  . Anticardiolipin antibody positive   . Perineal abscess   . Diabetes mellitus     type II  . GERD (gastroesophageal reflux disease)   . Peripheral vascular disease     peripheral vascular disease/carotid artery <100%  . Anxiety   . Depression   . 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 CEREBROVASCULAR ACCIDENT, HX OF 07/19/2008  . ANXIETY 07/19/2008  . DEPRESSION 07/19/2008  . DIABETES MELLITUS, TYPE II 01/18/2008  . GERD 07/19/2008  . HYPERLIPIDEMIA 07/19/2008  . HYPERTENSION 07/31/2009  . Increased prostate specific antigen (PSA) velocity 09/13/2010  . Unspecified Peripheral  Vascular Disease 01/18/2008  . PERSONAL HX COLONIC POLYPS 12/10/2007  . AAA (abdominal aortic aneurysm)     Past Surgical History  Procedure Laterality Date  . Hernia surgury      62 years old  . Perineal abscess    . Sp endo repair infraren aaa    . S/p right and left popliteal aneurysm repair    . 7 abdominal hernia repairs  05/31/10  . Colonoscopy    . Polypectomy    . Abdominal aortic aneurysm repair      History   Social History  . Marital Status: Married    Spouse Name: N/A    Number of Children: 1  . Years of Education: N/A   Occupational History  . dsiabled due to stroke    Social History Main Topics  . Smoking status: Former Smoker    Types: Cigarettes    Quit date: 01/14/1992  . Smokeless tobacco: Never Used  . Alcohol Use: 1.2 oz/week    2 Glasses of wine per week     Comment: wine daily  . Drug Use: No  . Sexually Active: Not on file   Other Topics Concern  . Not on file   Social History Narrative   2 cups of coffee in the morning and tea throughout the day.    Family History  Problem Relation Age of Onset  . Aortic aneurysm Mother     desceding aoritic aneurysm  . Cancer Brother     Lung cancer  Allergies as of 06/15/2012 - Review Complete 06/15/2012  Allergen Reaction Noted  . Latex Rash 08/03/2010    Current Outpatient Prescriptions on File Prior to Visit  Medication Sig Dispense Refill  . ascorbic Acid (VITAMIN C CR) 500 MG CPCR Take 500 mg by mouth 2 (two) times daily.        Marland Kitchen aspirin 81 MG tablet Take 81 mg by mouth daily.        Marland Kitchen atorvastatin (LIPITOR) 40 MG tablet Take 1 tablet (40 mg total) by mouth daily.  90 tablet  3  . cetirizine (ZYRTEC) 10 MG tablet Take 10 mg by mouth daily.        . clopidogrel (PLAVIX) 75 MG tablet Take 1 tablet (75 mg total) by mouth daily.  90 tablet  3  . desoximetasone (TOPICORT) 0.25 % cream Apply 0.25 % topically as needed.      . docusate sodium (COLACE) 100 MG capsule Take 100 mg by mouth 2 (two)  times daily.        . Fiber POWD Take by mouth daily.        . fluocinonide (LIDEX) 0.05 % external solution Apply 60 g topically as needed.      . folic acid (FOLVITE) 1 MG tablet Take 1 tablet (1 mg total) by mouth daily.  90 tablet  3  . gemfibrozil (LOPID) 600 MG tablet Take 1 tablet (600 mg total) by mouth 2 (two) times daily.  180 tablet  3  . mesalamine (CANASA) 1000 MG suppository Place 1 suppository (1,000 mg total) rectally 2 (two) times daily as needed. One suppository into rectum two times a day as needed  60 suppository  0  . Multiple Vitamin (MULTIVITAMIN) capsule Take 1 capsule by mouth daily.        Marland Kitchen olmesartan-hydrochlorothiazide (BENICAR HCT) 40-25 MG per tablet Take 1 tablet by mouth daily.  90 tablet  3  . Omega-3 Fatty Acids (FISH OIL PO) Take by mouth daily.        . ranitidine (ZANTAC) 150 MG tablet Take 1 tablet (150 mg total) by mouth 2 (two) times daily.  60 tablet  11   No current facility-administered medications on file prior to visit.     REVIEW OF SYSTEMS: Please see history of present illness, otherwise no changes from previous visit  PHYSICAL EXAMINATION:   Vital signs are BP 141/85  Pulse 78  Ht 5\' 10"  (1.778 m)  Wt 243 lb 11.2 oz (110.542 kg)  BMI 34.97 kg/m2  SpO2 100% General: The patient appears their stated age. HEENT:  No gross abnormalities Pulmonary:  Non labored breathing Abdomen: Soft and non-tender. No hernia identified Musculoskeletal: There are no major deformities. Neurologic: No focal weakness or paresthesias are detected, Skin: There are no ulcer or rashes noted. Psychiatric: The patient has normal affect. Cardiovascular: There is a regular rate and rhythm without significant murmur appreciated. Faintly palpable pedal pulses   Diagnostic Studies Duplex ultrasound was performed today. The velocity elevation within the stent on the left leg is 30 5 cm/s. This is slightly decreased from the study 3 months ago. His ABI is 0.93 on the  left and 1.0 on the right.  Assessment:  Status post repair of abdominal aortic aneurysm and bilateral popliteal aneurysms Plan: The patient continues to be asymptomatic. I am following a velocity elevation within the stent at the origin of his left popliteal bypass graft. The velocity profile has slightly decreased today. The patient remained asymptomatic. I  am recommending repeat surveillance in 6 months. At that time, the patient will also be due for a carotid duplex. He has a known left carotid occlusion with no stenosis on the right side  V. Charlena Cross, M.D.  [facility] [place] Office: [phone] Pager:  [phone]

## 2012-06-23 ENCOUNTER — Telehealth: Payer: Self-pay

## 2012-06-23 ENCOUNTER — Other Ambulatory Visit (INDEPENDENT_AMBULATORY_CARE_PROVIDER_SITE_OTHER): Payer: No Typology Code available for payment source

## 2012-06-23 DIAGNOSIS — E1059 Type 1 diabetes mellitus with other circulatory complications: Secondary | ICD-10-CM

## 2012-06-23 DIAGNOSIS — Z0000 Encounter for general adult medical examination without abnormal findings: Secondary | ICD-10-CM

## 2012-06-23 LAB — PSA: PSA: 1.66 ng/mL (ref 0.10–4.00)

## 2012-06-23 LAB — URINALYSIS, ROUTINE W REFLEX MICROSCOPIC
Bilirubin Urine: NEGATIVE
Ketones, ur: NEGATIVE
Urine Glucose: NEGATIVE
Urobilinogen, UA: 0.2 (ref 0.0–1.0)

## 2012-06-23 LAB — BASIC METABOLIC PANEL
CO2: 28 mEq/L (ref 19–32)
Calcium: 10.8 mg/dL — ABNORMAL HIGH (ref 8.4–10.5)
Chloride: 96 mEq/L (ref 96–112)
Glucose, Bld: 296 mg/dL — ABNORMAL HIGH (ref 70–99)
Sodium: 133 mEq/L — ABNORMAL LOW (ref 135–145)

## 2012-06-23 LAB — LIPID PANEL
HDL: 32.1 mg/dL — ABNORMAL LOW (ref >39.00)
Total CHOL/HDL Ratio: 4
Triglycerides: 340 mg/dL — ABNORMAL HIGH (ref 0.0–149.0)
VLDL: 68 mg/dL — ABNORMAL HIGH (ref 0.0–40.0)

## 2012-06-23 LAB — CBC WITH DIFFERENTIAL/PLATELET
Eosinophils Relative: 9.1 % — ABNORMAL HIGH (ref 0.0–5.0)
HCT: 39.3 % (ref 39.0–52.0)
Hemoglobin: 13.8 g/dL (ref 13.0–17.0)
Lymphs Abs: 1 10*3/uL (ref 0.7–4.0)
Monocytes Relative: 9.6 % (ref 3.0–12.0)
Platelets: 183 10*3/uL (ref 150.0–400.0)
WBC: 4.2 10*3/uL — ABNORMAL LOW (ref 4.5–10.5)

## 2012-06-23 LAB — HEPATIC FUNCTION PANEL
ALT: 47 U/L (ref 0–53)
AST: 50 U/L — ABNORMAL HIGH (ref 0–37)
Albumin: 4.2 g/dL (ref 3.5–5.2)
Alkaline Phosphatase: 29 U/L — ABNORMAL LOW (ref 39–117)
Total Protein: 7.6 g/dL (ref 6.0–8.3)

## 2012-06-23 LAB — MICROALBUMIN / CREATININE URINE RATIO: Microalb, Ur: 9.4 mg/dL — ABNORMAL HIGH (ref 0.0–1.9)

## 2012-06-23 NOTE — Telephone Encounter (Signed)
CPX labs  

## 2012-06-25 ENCOUNTER — Ambulatory Visit (INDEPENDENT_AMBULATORY_CARE_PROVIDER_SITE_OTHER): Payer: No Typology Code available for payment source | Admitting: Internal Medicine

## 2012-06-25 ENCOUNTER — Encounter: Payer: Self-pay | Admitting: Internal Medicine

## 2012-06-25 VITALS — BP 104/70 | HR 76 | Temp 97.5°F | Ht 70.0 in | Wt 241.5 lb

## 2012-06-25 DIAGNOSIS — Z0000 Encounter for general adult medical examination without abnormal findings: Secondary | ICD-10-CM

## 2012-06-25 DIAGNOSIS — E119 Type 2 diabetes mellitus without complications: Secondary | ICD-10-CM

## 2012-06-25 MED ORDER — GEMFIBROZIL 600 MG PO TABS: 600.0000 mg | ORAL_TABLET | Freq: Two times a day (BID) | ORAL | Status: DC

## 2012-06-25 MED ORDER — CLOPIDOGREL BISULFATE 75 MG PO TABS: 75.0000 mg | ORAL_TABLET | Freq: Every day | ORAL | Status: DC

## 2012-06-25 MED ORDER — OLMESARTAN MEDOXOMIL-HCTZ 40-25 MG PO TABS: 1.0000 | ORAL_TABLET | Freq: Every day | ORAL | Status: DC

## 2012-06-25 MED ORDER — FOLIC ACID 1 MG PO TABS: 1.0000 mg | ORAL_TABLET | Freq: Every day | ORAL | Status: DC

## 2012-06-25 MED ORDER — ATORVASTATIN CALCIUM 40 MG PO TABS: 40.0000 mg | ORAL_TABLET | Freq: Every day | ORAL | Status: DC

## 2012-06-25 MED ORDER — METFORMIN HCL ER 500 MG PO TB24: 1000.0000 mg | ORAL_TABLET | Freq: Every day | ORAL | Status: DC

## 2012-06-25 NOTE — Progress Notes (Signed)
Subjective:    Patient ID: David [name], male    DOB: 13-Jun-1950, 62 y.o.   MRN: [id number]  HPI  Here for wellness and f/u;  Overall doing ok;  Pt denies CP, worsening SOB, DOE, wheezing, orthopnea, PND, worsening LE edema, palpitations, dizziness or syncope.  Pt denies neurological change such as new headache, facial or extremity weakness.  Pt denies polydipsia, polyuria, or low sugar symptoms. Pt states overall good compliance with treatment and medications, good tolerability, and has been trying to follow lower cholesterol diet.  Pt denies worsening depressive symptoms, suicidal ideation or panic. No fever, night sweats, wt loss, loss of appetite, or other constitutional symptoms.  Pt states good ability with ADL's, has low fall risk, home safety reviewed and adequate, no other significant changes in hearing or vision, and only occasionally active with exercise.  Wt about 244 in dec 2012, now 241.  Needs mult med refills Past Medical History  Diagnosis Date  . Diverticulosis   . Hemorrhoids   . Hx of adenomatous colonic polyps   . Hyperlipidemia   . Hypertension   . CVA (cerebral vascular accident) 2003  . Anticardiolipin antibody positive   . Perineal abscess   . Diabetes mellitus     type II  . GERD (gastroesophageal reflux disease)   . Peripheral vascular disease     peripheral vascular disease/carotid artery <100%  . Anxiety   . Depression   . 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 CEREBROVASCULAR ACCIDENT, HX OF 07/19/2008  . ANXIETY 07/19/2008  . DEPRESSION 07/19/2008  . DIABETES MELLITUS, TYPE II 01/18/2008  . GERD 07/19/2008  . HYPERLIPIDEMIA 07/19/2008  . HYPERTENSION 07/31/2009  . Increased prostate specific antigen (PSA) velocity 09/13/2010  . Unspecified Peripheral Vascular Disease 01/18/2008  . PERSONAL HX COLONIC POLYPS 12/10/2007  . AAA (abdominal aortic aneurysm)    Past Surgical History  Procedure Laterality Date  . Hernia surgury      62 years old  .  Perineal abscess    . Sp endo repair infraren aaa    . S/p right and left popliteal aneurysm repair    . 7 abdominal hernia repairs  05/31/10  . Colonoscopy    . Polypectomy    . Abdominal aortic aneurysm repair      reports that he quit smoking about 20 years ago. His smoking use included Cigarettes. He smoked 0.00 packs per day. He has never used smokeless tobacco. He reports that he drinks about 1.2 ounces of alcohol per week. He reports that he does not use illicit drugs. family history includes Aortic aneurysm in his mother and Cancer in his brother. Allergies  Allergen Reactions  . Latex Rash   Current Outpatient Prescriptions on File Prior to Visit  Medication Sig Dispense Refill  . ascorbic Acid (VITAMIN C CR) 500 MG CPCR Take 500 mg by mouth 2 (two) times daily.        Marland Kitchen aspirin 81 MG tablet Take 81 mg by mouth daily.        . cetirizine (ZYRTEC) 10 MG tablet Take 10 mg by mouth daily.        Marland Kitchen desoximetasone (TOPICORT) 0.25 % cream Apply 0.25 % topically as needed.      . docusate sodium (COLACE) 100 MG capsule Take 100 mg by mouth 2 (two) times daily.        . Fiber POWD Take by mouth daily.        . fluocinonide (LIDEX) 0.05 %  external solution Apply 60 g topically as needed.      . mesalamine (CANASA) 1000 MG suppository Place 1 suppository (1,000 mg total) rectally 2 (two) times daily as needed. One suppository into rectum two times a day as needed  60 suppository  0  . Multiple Vitamin (MULTIVITAMIN) capsule Take 1 capsule by mouth daily.        . Omega-3 Fatty Acids (FISH OIL PO) Take by mouth daily.        . ranitidine (ZANTAC) 150 MG tablet Take 1 tablet (150 mg total) by mouth 2 (two) times daily.  60 tablet  11   No current facility-administered medications on file prior to visit.   Review of Systems Constitutional: Negative for diaphoresis, activity change, appetite change or unexpected weight change.  HENT: Negative for hearing loss, ear pain, facial swelling,  mouth sores and neck stiffness.   Eyes: Negative for pain, redness and visual disturbance.  Respiratory: Negative for shortness of breath and wheezing.   Cardiovascular: Negative for chest pain and palpitations.  Gastrointestinal: Negative for diarrhea, blood in stool, abdominal distention or other pain Genitourinary: Negative for hematuria, flank pain or change in urine volume.  Musculoskeletal: Negative for myalgias and joint swelling.  Skin: Negative for color change and wound.  Neurological: Negative for syncope and numbness. other than noted Hematological: Negative for adenopathy.  Psychiatric/Behavioral: Negative for hallucinations, self-injury, decreased concentration and agitation.      Objective:   Physical Exam BP 104/70  Pulse 76  Temp(Src) 97.5 F (36.4 C) (Oral)  Ht 5\' 10"  (1.778 m)  Wt 241 lb 8 oz (109.544 kg)  BMI 34.65 kg/m2  SpO2 98% VS noted,  Constitutional: Pt is oriented to person, place, and time. Appears well-developed and well-nourished.  Head: Normocephalic and atraumatic.  Right Ear: External ear normal.  Left Ear: External ear normal.  Nose: Nose normal.  Mouth/Throat: Oropharynx is clear and moist.  Eyes: Conjunctivae and EOM are normal. Pupils are equal, round, and reactive to light.  Neck: Normal range of motion. Neck supple. No JVD present. No tracheal deviation present.  Cardiovascular: Normal rate, regular rhythm, normal heart sounds and intact distal pulses.   Pulmonary/Chest: Effort normal and breath sounds normal.  Abdominal: Soft. Bowel sounds are normal. There is no tenderness. No HSM  Musculoskeletal: Normal range of motion. Exhibits no edema.  Lymphadenopathy:  Has no cervical adenopathy.  Neurological: Pt is alert and oriented to person, place, and time. Pt has normal reflexes. No cranial nerve deficit.  Skin: Skin is warm and dry. No rash noted.  Psychiatric:  Has  normal mood and affect. Behavior is normal.     Assessment & Plan:

## 2012-06-25 NOTE — Assessment & Plan Note (Addendum)
Uncontrolled -  overall by history and exam, recent data reviewed with pt, and pt to start metformin 500 - 2 qd,  to f/u any worsening symptoms or concerns Lab Results  Component Value Date   HGBA1C 8.5* 06/23/2012

## 2012-06-25 NOTE — Patient Instructions (Addendum)
Please take all new medication as prescribed - the metformin ER 500 mg - 2 per day Please continue all other medications as before, and refills have been done if requested. Please continue your efforts at being more active, low cholesterol diet, and weight control. You are otherwise up to date with prevention measures today. Please keep your appointments with your specialists as you have planned Thank you for enrolling in MyChart. Please follow the instructions below to securely access your online medical record. MyChart allows you to send messages to your doctor, view your test results, renew your prescriptions, schedule appointments, and more. To Log into My Chart online, please go by [employer] or [employer] to [email or web address], or download the MyChart App from the [employer] of [employer] .  Your Username is: [email or web address] (pass sugarfoot) Please send a practice Message on Mychart later today. Please return in 6 months, or sooner if needed, with Lab testing done 3-5 days before

## 2012-06-25 NOTE — Assessment & Plan Note (Signed)

## 2012-12-11 ENCOUNTER — Encounter: Payer: Self-pay | Admitting: Surgery

## 2012-12-14 ENCOUNTER — Ambulatory Visit (INDEPENDENT_AMBULATORY_CARE_PROVIDER_SITE_OTHER): Payer: No Typology Code available for payment source | Admitting: Surgery

## 2012-12-14 ENCOUNTER — Other Ambulatory Visit: Payer: Self-pay | Admitting: *Deleted

## 2012-12-14 ENCOUNTER — Encounter: Payer: Self-pay | Admitting: Surgery

## 2012-12-14 ENCOUNTER — Encounter (INDEPENDENT_AMBULATORY_CARE_PROVIDER_SITE_OTHER): Payer: No Typology Code available for payment source | Admitting: *Deleted

## 2012-12-14 ENCOUNTER — Encounter: Payer: Self-pay | Admitting: *Deleted

## 2012-12-14 ENCOUNTER — Other Ambulatory Visit (INDEPENDENT_AMBULATORY_CARE_PROVIDER_SITE_OTHER): Payer: No Typology Code available for payment source | Admitting: *Deleted

## 2012-12-14 VITALS — BP 160/87 | HR 64 | Ht 70.0 in | Wt 242.0 lb

## 2012-12-14 DIAGNOSIS — I714 Abdominal aortic aneurysm, without rupture, unspecified: Secondary | ICD-10-CM

## 2012-12-14 DIAGNOSIS — I6529 Occlusion and stenosis of unspecified carotid artery: Secondary | ICD-10-CM

## 2012-12-14 DIAGNOSIS — I739 Peripheral vascular disease, unspecified: Secondary | ICD-10-CM

## 2012-12-14 DIAGNOSIS — Z48812 Encounter for surgical aftercare following surgery on the circulatory system: Secondary | ICD-10-CM

## 2012-12-14 NOTE — Progress Notes (Signed)
[facility] [place]   Patient name: David [name] MRN: [id number] DOB: 1950/10/20 Sex: male     Chief Complaint  Patient presents with  . Re-evaluation    6 month abi's and LE art     HISTORY OF PRESENT ILLNESS: The patient comes back today for followup. He has undergone open abdominal aortic aneurysm repair by Dr. Madilyn Fireman in 2012. 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 He had his left popliteal aneurysm repaired by above-knee to below-knee bypass in October of 2010 he's had a midline abdominal hernia repair by Dr. gross. He had elevated velocities in the proximal anastomosis of his bypass graft and underwent arteriogram which revealed a high-grade stenosis extending into his bypass graft balloon angioplasty was performed however this resulted in a dissection and he therefore underwent stenting using a 6 mm stent he is back today for followup. We have been following velocity elevations at the stent on the left leg. He reports no symptoms of claudication. He reports no ulceration. He continues to take his Plavix.   Past Medical History  Diagnosis Date  . Diverticulosis   . Hemorrhoids   . Hx of adenomatous colonic polyps   . Hyperlipidemia   . Hypertension   . CVA (cerebral vascular accident) 2003  . Anticardiolipin antibody positive   . Perineal abscess   . Diabetes mellitus     type II  . GERD (gastroesophageal reflux disease)   . Peripheral vascular disease     peripheral vascular disease/carotid artery <100%  . Anxiety   . Depression   . 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 CEREBROVASCULAR ACCIDENT, HX OF 07/19/2008  . ANXIETY 07/19/2008  . DEPRESSION 07/19/2008  . DIABETES MELLITUS, TYPE II 01/18/2008  . GERD 07/19/2008  . HYPERLIPIDEMIA 07/19/2008  . HYPERTENSION 07/31/2009  . Increased prostate specific antigen (PSA) velocity 09/13/2010  . Unspecified Peripheral Vascular Disease 01/18/2008  . PERSONAL HX COLONIC POLYPS  12/10/2007  . AAA (abdominal aortic aneurysm)     Past Surgical History  Procedure Laterality Date  . Hernia surgury      62 years old  . Perineal abscess    . Sp endo repair infraren aaa    . S/p right and left popliteal aneurysm repair    . 7 abdominal hernia repairs  05/31/10  . Colonoscopy    . Polypectomy    . Abdominal aortic aneurysm repair      History   Social History  . Marital Status: Married    Spouse Name: N/A    Number of Children: 1  . Years of Education: N/A   Occupational History  . dsiabled due to stroke    Social History Main Topics  . Smoking status: Former Smoker    Types: Cigarettes    Quit date: 01/14/1992  . Smokeless tobacco: Never Used  . Alcohol Use: 1.2 oz/week    2 Glasses of wine per week     Comment: wine daily  . Drug Use: No  . Sexually Active: Not on file   Other Topics Concern  . Not on file   Social History Narrative   2 cups of coffee in the morning and tea throughout the day.    Family History  Problem Relation Age of Onset  . Aortic aneurysm Mother     desceding aoritic aneurysm  . Cancer Brother     Lung cancer    Allergies as of 12/14/2012 - Review Complete  12/14/2012  Allergen Reaction Noted  . Latex Rash 08/03/2010    Current Outpatient Prescriptions on File Prior to Visit  Medication Sig Dispense Refill  . ascorbic Acid (VITAMIN C CR) 500 MG CPCR Take 500 mg by mouth 2 (two) times daily.        Marland Kitchen aspirin 81 MG tablet Take 81 mg by mouth daily.        Marland Kitchen atorvastatin (LIPITOR) 40 MG tablet Take 1 tablet (40 mg total) by mouth daily.  90 tablet  3  . cetirizine (ZYRTEC) 10 MG tablet Take 10 mg by mouth daily.        . clopidogrel (PLAVIX) 75 MG tablet Take 1 tablet (75 mg total) by mouth daily.  90 tablet  3  . desoximetasone (TOPICORT) 0.25 % cream Apply 0.25 % topically as needed.      . docusate sodium (COLACE) 100 MG capsule Take 100 mg by mouth 2 (two) times daily.        . Fiber POWD Take by mouth daily.         . fluocinonide (LIDEX) 0.05 % external solution Apply 60 g topically as needed.      . folic acid (FOLVITE) 1 MG tablet Take 1 tablet (1 mg total) by mouth daily.  90 tablet  3  . gemfibrozil (LOPID) 600 MG tablet Take 1 tablet (600 mg total) by mouth 2 (two) times daily.  180 tablet  3  . mesalamine (CANASA) 1000 MG suppository Place 1 suppository (1,000 mg total) rectally 2 (two) times daily as needed. One suppository into rectum two times a day as needed  60 suppository  0  . metFORMIN (GLUCOPHAGE-XR) 500 MG 24 hr tablet Take 2 tablets (1,000 mg total) by mouth daily with breakfast.  180 tablet  3  . Multiple Vitamin (MULTIVITAMIN) capsule Take 1 capsule by mouth daily.        Marland Kitchen olmesartan-hydrochlorothiazide (BENICAR HCT) 40-25 MG per tablet Take 1 tablet by mouth daily.  90 tablet  3  . Omega-3 Fatty Acids (FISH OIL PO) Take by mouth daily.        . ranitidine (ZANTAC) 150 MG tablet Take 1 tablet (150 mg total) by mouth 2 (two) times daily.  60 tablet  11   No current facility-administered medications on file prior to visit.     REVIEW OF SYSTEMS: No change from prior  PHYSICAL EXAMINATION:   Vital signs are BP 160/87  Pulse 64  Ht 5\' 10"  (1.778 m)  Wt 242 lb (109.77 kg)  BMI 34.72 kg/m2  SpO2 100% General: The patient appears their stated age. HEENT:  No gross abnormalities Pulmonary:  Non labored breathing Abdomen: Soft and non-tender Musculoskeletal: There are no major deformities. Neurologic: No focal weakness or paresthesias are detected, Skin: There are no ulcer or rashes noted. Psychiatric: The patient has normal affect. Cardiovascular: There is a regular rate and rhythm without significant murmur appreciated. I cannot palpate pedal pulses today. He does have bilateral edema  Diagnostic Studies Carotid duplex: Known occlusion of the left carotid artery. He has less than 40% right carotid stenosis  Lower extremity duplex: Peak velocity within the stent is 3 30  cm/s. The patient has gone from triphasic to monophasic waveforms within his bypass graft, however he does have triphasic waveforms in his posterior tibial artery at the ankle. This been a slight decrease in the ABI. Previously was 0.93 on the left, today to 0.85. On the right ABIs remained greater than  1 triphasic waveforms  Assessment: Status post abdominal aneurysm repair, percutaneous right popliteal aneurysm repair, and left leg bypass for left popliteal aneurysm Plan: The patient has stable carotid disease on the right. He has a known left carotid occlusion. He remained asymptomatic, this we followed up on a yearly basis.  Lower extremity: The patient has had a change in his waveforms from triphasic to monophasic, within the bypass graft. Although the velocity profile within the stent remained stable, and concern about the lower velocity waveforms within the graft, I'm concerned that this may be a graft threatening situation. For that reason, I feel that he needs to undergo angiography to better evaluate this area and possible percutaneous treatment showed a lesion be identified. This is been scheduled for Tuesday, September 9. I will plan on access within the right groin, studying both legs and intervening on the left is necessary.  Jorge Ny, M.D. [facility] [place] Office: [phone] Pager:  [phone]

## 2012-12-22 ENCOUNTER — Other Ambulatory Visit (INDEPENDENT_AMBULATORY_CARE_PROVIDER_SITE_OTHER): Payer: No Typology Code available for payment source

## 2012-12-22 DIAGNOSIS — E119 Type 2 diabetes mellitus without complications: Secondary | ICD-10-CM

## 2012-12-22 LAB — HEPATIC FUNCTION PANEL
AST: 39 U/L — ABNORMAL HIGH (ref 0–37)
Alkaline Phosphatase: 26 U/L — ABNORMAL LOW (ref 39–117)
Bilirubin, Direct: 0.1 mg/dL (ref 0.0–0.3)
Total Bilirubin: 0.6 mg/dL (ref 0.3–1.2)

## 2012-12-22 LAB — BASIC METABOLIC PANEL
GFR: 89.09 mL/min (ref >60.00)
Potassium: 4.3 mEq/L (ref 3.5–5.1)
Sodium: 134 mEq/L — ABNORMAL LOW (ref 135–145)

## 2012-12-22 LAB — LIPID PANEL
HDL: 29.9 mg/dL — ABNORMAL LOW (ref >39.00)
Total CHOL/HDL Ratio: 4
VLDL: 63.2 mg/dL — ABNORMAL HIGH (ref 0.0–40.0)

## 2012-12-24 ENCOUNTER — Ambulatory Visit (INDEPENDENT_AMBULATORY_CARE_PROVIDER_SITE_OTHER): Payer: No Typology Code available for payment source | Admitting: Internal Medicine

## 2012-12-24 ENCOUNTER — Encounter: Payer: Self-pay | Admitting: Internal Medicine

## 2012-12-24 ENCOUNTER — Ambulatory Visit: Payer: No Typology Code available for payment source | Admitting: Internal Medicine

## 2012-12-24 VITALS — BP 112/70 | HR 73 | Temp 97.0°F | Ht 70.0 in | Wt 243.2 lb

## 2012-12-24 DIAGNOSIS — E119 Type 2 diabetes mellitus without complications: Secondary | ICD-10-CM

## 2012-12-24 DIAGNOSIS — I10 Essential (primary) hypertension: Secondary | ICD-10-CM

## 2012-12-24 DIAGNOSIS — E785 Hyperlipidemia, unspecified: Secondary | ICD-10-CM

## 2012-12-24 MED ORDER — ATORVASTATIN CALCIUM 40 MG PO TABS: 40.0000 mg | ORAL_TABLET | Freq: Every day | ORAL | Status: DC

## 2012-12-24 MED ORDER — GEMFIBROZIL 600 MG PO TABS: 600.0000 mg | ORAL_TABLET | Freq: Two times a day (BID) | ORAL | Status: DC

## 2012-12-24 MED ORDER — CLOPIDOGREL BISULFATE 75 MG PO TABS: 75.0000 mg | ORAL_TABLET | Freq: Every day | ORAL | Status: DC

## 2012-12-24 MED ORDER — OLMESARTAN MEDOXOMIL-HCTZ 40-25 MG PO TABS: 1.0000 | ORAL_TABLET | Freq: Every day | ORAL | Status: DC

## 2012-12-24 MED ORDER — METFORMIN HCL ER 500 MG PO TB24: ORAL_TABLET | ORAL | Status: DC

## 2012-12-24 MED ORDER — FOLIC ACID 1 MG PO TABS: 1.0000 mg | ORAL_TABLET | Freq: Every day | ORAL | Status: DC

## 2012-12-24 NOTE — Progress Notes (Signed)
Subjective:    Patient ID: David [name], male    DOB: 08/19/1950, 62 y.o.   MRN: [id number]  HPI  Here to f/u; overall doing ok,  Pt denies chest pain, increased sob or doe, wheezing, orthopnea, PND, increased LE swelling, palpitations, dizziness or syncope.  Pt denies polydipsia, polyuria, or low sugar symptoms such as weakness or confusion improved with po intake.  Pt denies new neurological symptoms such as new headache, or facial or extremity weakness or numbness.   Pt states overall good compliance with meds, has had some noncompliance with  lower cholesterol, diabetic diet recently, with wt overall stable,  but little exercise however, mostly due to recurring left knee pain. Has ongoing vascular dz with upcoming procedure planned Past Medical History  Diagnosis Date  . Diverticulosis   . Hemorrhoids   . Hx of adenomatous colonic polyps   . Hyperlipidemia   . Hypertension   . CVA (cerebral vascular accident) 2003  . Anticardiolipin antibody positive   . Perineal abscess   . Diabetes mellitus     type II  . GERD (gastroesophageal reflux disease)   . Peripheral vascular disease     peripheral vascular disease/carotid artery <100%  . Anxiety   . Depression   . 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 CEREBROVASCULAR ACCIDENT, HX OF 07/19/2008  . ANXIETY 07/19/2008  . DEPRESSION 07/19/2008  . DIABETES MELLITUS, TYPE II 01/18/2008  . GERD 07/19/2008  . HYPERLIPIDEMIA 07/19/2008  . HYPERTENSION 07/31/2009  . Increased prostate specific antigen (PSA) velocity 09/13/2010  . Unspecified Peripheral Vascular Disease 01/18/2008  . PERSONAL HX COLONIC POLYPS 12/10/2007  . AAA (abdominal aortic aneurysm)    Past Surgical History  Procedure Laterality Date  . Hernia surgury      62 years old  . Perineal abscess    . Sp endo repair infraren aaa    . S/p right and left popliteal aneurysm repair    . 7 abdominal hernia repairs  05/31/10  . Colonoscopy    . Polypectomy    . Abdominal  aortic aneurysm repair      reports that he quit smoking about 20 years ago. His smoking use included Cigarettes. He smoked 0.00 packs per day. He has never used smokeless tobacco. He reports that he drinks about 1.2 ounces of alcohol per week. He reports that he does not use illicit drugs. family history includes Aortic aneurysm in his mother; Cancer in his brother. Allergies  Allergen Reactions  . Latex Rash   Current Outpatient Prescriptions on File Prior to Visit  Medication Sig Dispense Refill  . ascorbic Acid (VITAMIN C CR) 500 MG CPCR Take 500 mg by mouth 2 (two) times daily.        Marland Kitchen aspirin 81 MG tablet Take 81 mg by mouth daily.        . cetirizine (ZYRTEC) 10 MG tablet Take 10 mg by mouth daily.        Marland Kitchen desoximetasone (TOPICORT) 0.25 % cream Apply 0.25 % topically as needed.      . docusate sodium (COLACE) 100 MG capsule Take 100 mg by mouth 2 (two) times daily.        . Fiber POWD Take by mouth daily.        . fluocinonide (LIDEX) 0.05 % external solution Apply 60 g topically as needed.      . mesalamine (CANASA) 1000 MG suppository Place 1 suppository (1,000 mg total) rectally 2 (two) times daily as  needed. One suppository into rectum two times a day as needed  60 suppository  0  . Multiple Vitamin (MULTIVITAMIN) capsule Take 1 capsule by mouth daily.        . Omega-3 Fatty Acids (FISH OIL PO) Take by mouth daily.        . ranitidine (ZANTAC) 150 MG tablet Take 1 tablet (150 mg total) by mouth 2 (two) times daily.  60 tablet  11   No current facility-administered medications on file prior to visit.   Review of Systems  Constitutional: Negative for unexpected weight change, or unusual diaphoresis  HENT: Negative for tinnitus.   Eyes: Negative for photophobia and visual disturbance.  Respiratory: Negative for choking and stridor.   Gastrointestinal: Negative for vomiting and blood in stool.  Genitourinary: Negative for hematuria and decreased urine volume.  Musculoskeletal:  Negative for acute joint swelling Skin: Negative for color change and wound.  Neurological: Negative for tremors and numbness other than noted  Psychiatric/Behavioral: Negative for decreased concentration or  hyperactivity.       Objective:   Physical Exam BP 112/70  Pulse 73  Temp(Src) 97 F (36.1 C) (Oral)  Ht 5\' 10"  (1.778 m)  Wt 243 lb 4 oz (110.337 kg)  BMI 34.9 kg/m2  SpO2 97% VS noted, not ill appearing Constitutional: Pt appears well-developed and well-nourished.  HENT: Head: NCAT.  Right Ear: External ear normal.  Left Ear: External ear normal.  Eyes: Conjunctivae and EOM are normal. Pupils are equal, round, and reactive to light.  Neck: Normal range of motion. Neck supple.  Cardiovascular: Normal rate and regular rhythm.   Pulmonary/Chest: Effort normal and breath sounds normal.  Neurological: Pt is alert. Not confused  Skin: Skin is warm. No erythema.  Psychiatric: Pt behavior is normal. Thought content normal.     Assessment & Plan:

## 2012-12-24 NOTE — Patient Instructions (Addendum)
OK to increase the metformin ER 500 mg to 4 pills per day, and watch for any GI side effects Please continue all other medications as before, and refills have been done if requested. Please have the pharmacy call with any other refills you may need.  Please keep your appointments with your specialists as you have planned  Please remember to sign up for My Chart if you have not done so, as this will be important to you in the future with finding out test results, communicating by private email, and scheduling acute appointments online when needed.  Please return in 6 months, or sooner if needed, with Lab testing done 3-5 days before

## 2012-12-24 NOTE — Assessment & Plan Note (Signed)
stable overall by history and exam, recent data reviewed with pt, and pt to increase the metformin to 4 tabs per day,  to f/u any worsening symptoms or concerns Lab Results  Component Value Date   HGBA1C 8.4* 12/22/2012

## 2012-12-24 NOTE — Assessment & Plan Note (Signed)
stable overall by history and exam, recent data reviewed with pt, and pt to continue medical treatment as before,  to f/u any worsening symptoms or concerns Lab Results  Component Value Date   LDLCALC 51 09/13/2010

## 2012-12-24 NOTE — Assessment & Plan Note (Signed)
stable overall by history and exam, recent data reviewed with pt, and pt to continue medical treatment as before,  to f/u any worsening symptoms or concerns BP Readings from Last 3 Encounters:  12/24/12 112/70  12/14/12 160/87  06/25/12 104/70

## 2012-12-28 ENCOUNTER — Other Ambulatory Visit: Payer: Self-pay | Admitting: *Deleted

## 2012-12-28 DIAGNOSIS — Z48812 Encounter for surgical aftercare following surgery on the circulatory system: Secondary | ICD-10-CM

## 2012-12-31 ENCOUNTER — Encounter (HOSPITAL_COMMUNITY): Payer: Self-pay | Admitting: Pharmacy Technician

## 2013-01-12 ENCOUNTER — Encounter (HOSPITAL_COMMUNITY)
Admission: RE | Disposition: A | Discharge status: Home / Self Care | Payer: Self-pay | Source: Ambulatory Visit | Attending: Surgery

## 2013-01-12 ENCOUNTER — Telehealth: Payer: Self-pay | Admitting: Surgery

## 2013-01-12 ENCOUNTER — Other Ambulatory Visit: Payer: Self-pay | Admitting: *Deleted

## 2013-01-12 ENCOUNTER — Ambulatory Visit (HOSPITAL_COMMUNITY)
Admission: RE | Admit: 2013-01-12 | Discharge: 2013-01-12 | Disposition: A | Discharge status: Home / Self Care | Payer: No Typology Code available for payment source | Source: Ambulatory Visit | Attending: Surgery | Admitting: Surgery

## 2013-01-12 DIAGNOSIS — F3289 Other specified depressive episodes: Secondary | ICD-10-CM | POA: Insufficient documentation

## 2013-01-12 DIAGNOSIS — E785 Hyperlipidemia, unspecified: Secondary | ICD-10-CM | POA: Insufficient documentation

## 2013-01-12 DIAGNOSIS — Y831 Surgical operation with implant of artificial internal device as the cause of abnormal reaction of the patient, or of later complication, without mention of misadventure at the time of the procedure: Secondary | ICD-10-CM | POA: Insufficient documentation

## 2013-01-12 DIAGNOSIS — Z79899 Other long term (current) drug therapy: Secondary | ICD-10-CM | POA: Insufficient documentation

## 2013-01-12 DIAGNOSIS — I714 Abdominal aortic aneurysm, without rupture, unspecified: Secondary | ICD-10-CM | POA: Insufficient documentation

## 2013-01-12 DIAGNOSIS — T82898A Other specified complication of vascular prosthetic devices, implants and grafts, initial encounter: Secondary | ICD-10-CM | POA: Insufficient documentation

## 2013-01-12 DIAGNOSIS — I10 Essential (primary) hypertension: Secondary | ICD-10-CM | POA: Insufficient documentation

## 2013-01-12 DIAGNOSIS — Z91040 Latex allergy status: Secondary | ICD-10-CM | POA: Insufficient documentation

## 2013-01-12 DIAGNOSIS — K219 Gastro-esophageal reflux disease without esophagitis: Secondary | ICD-10-CM | POA: Insufficient documentation

## 2013-01-12 DIAGNOSIS — F411 Generalized anxiety disorder: Secondary | ICD-10-CM | POA: Insufficient documentation

## 2013-01-12 DIAGNOSIS — Z87891 Personal history of nicotine dependence: Secondary | ICD-10-CM | POA: Insufficient documentation

## 2013-01-12 DIAGNOSIS — F329 Major depressive disorder, single episode, unspecified: Secondary | ICD-10-CM | POA: Insufficient documentation

## 2013-01-12 DIAGNOSIS — E119 Type 2 diabetes mellitus without complications: Secondary | ICD-10-CM | POA: Insufficient documentation

## 2013-01-12 DIAGNOSIS — Z48812 Encounter for surgical aftercare following surgery on the circulatory system: Secondary | ICD-10-CM

## 2013-01-12 DIAGNOSIS — I739 Peripheral vascular disease, unspecified: Secondary | ICD-10-CM

## 2013-01-12 DIAGNOSIS — I70219 Atherosclerosis of native arteries of extremities with intermittent claudication, unspecified extremity: Secondary | ICD-10-CM

## 2013-01-12 DIAGNOSIS — I6529 Occlusion and stenosis of unspecified carotid artery: Secondary | ICD-10-CM | POA: Insufficient documentation

## 2013-01-12 DIAGNOSIS — Y929 Unspecified place or not applicable: Secondary | ICD-10-CM | POA: Insufficient documentation

## 2013-01-12 DIAGNOSIS — I70209 Unspecified atherosclerosis of native arteries of extremities, unspecified extremity: Secondary | ICD-10-CM | POA: Insufficient documentation

## 2013-01-12 DIAGNOSIS — I724 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: Secondary | ICD-10-CM | POA: Insufficient documentation

## 2013-01-12 DIAGNOSIS — Z7902 Long term (current) use of antithrombotics/antiplatelets: Secondary | ICD-10-CM | POA: Insufficient documentation

## 2013-01-12 DIAGNOSIS — Z7982 Long term (current) use of aspirin: Secondary | ICD-10-CM | POA: Insufficient documentation

## 2013-01-12 DIAGNOSIS — Z8673 Personal history of transient ischemic attack (TIA), and cerebral infarction without residual deficits: Secondary | ICD-10-CM | POA: Insufficient documentation

## 2013-01-12 HISTORY — PX: ABDOMINAL AORTAGRAM: SHX5454

## 2013-01-12 LAB — POCT I-STAT, CHEM 8
Creatinine, Ser: 0.9 mg/dL (ref 0.50–1.35)
Glucose, Bld: 251 mg/dL — ABNORMAL HIGH (ref 70–99)
HCT: 39 % (ref 39.0–52.0)
Hemoglobin: 13.3 g/dL (ref 13.0–17.0)
TCO2: 23 mmol/L (ref 0–100)

## 2013-01-12 LAB — GLUCOSE, CAPILLARY: Glucose-Capillary: 228 mg/dL — ABNORMAL HIGH (ref 70–99)

## 2013-01-12 LAB — POCT ACTIVATED CLOTTING TIME
Activated Clotting Time: 242 seconds
Activated Clotting Time: 268 seconds

## 2013-01-12 SURGERY — ABDOMINAL AORTAGRAM
Anesthesia: LOCAL

## 2013-01-12 MED ORDER — GUAIFENESIN-DM 100-10 MG/5ML PO SYRP: 15.0000 mL | ORAL_SOLUTION | ORAL | Status: DC | PRN

## 2013-01-12 MED ORDER — ACETAMINOPHEN 325 MG PO TABS: 325.0000 mg | ORAL_TABLET | ORAL | Status: DC | PRN

## 2013-01-12 MED ORDER — FENTANYL CITRATE 0.05 MG/ML IJ SOLN
INTRAMUSCULAR | Status: AC
  Filled 2013-01-12: qty 2

## 2013-01-12 MED ORDER — SODIUM CHLORIDE 0.9 % IV SOLN
INTRAVENOUS | Status: DC
  Administered 2013-01-12: 1000 mL via INTRAVENOUS

## 2013-01-12 MED ORDER — ONDANSETRON HCL 4 MG/2ML IJ SOLN: 4.0000 mg | Freq: Four times a day (QID) | INTRAMUSCULAR | Status: DC | PRN

## 2013-01-12 MED ORDER — MIDAZOLAM HCL 2 MG/2ML IJ SOLN
INTRAMUSCULAR | Status: AC
  Filled 2013-01-12: qty 2

## 2013-01-12 MED ORDER — ACETAMINOPHEN 325 MG RE SUPP: 325.0000 mg | RECTAL | Status: DC | PRN

## 2013-01-12 MED ORDER — LABETALOL HCL 5 MG/ML IV SOLN: 10.0000 mg | INTRAVENOUS | Status: DC | PRN

## 2013-01-12 MED ORDER — OXYCODONE HCL 5 MG PO TABS: 5.0000 mg | ORAL_TABLET | ORAL | Status: DC | PRN

## 2013-01-12 MED ORDER — HEPARIN SODIUM (PORCINE) 1000 UNIT/ML IJ SOLN
INTRAMUSCULAR | Status: AC
  Filled 2013-01-12: qty 1

## 2013-01-12 MED ORDER — PHENOL 1.4 % MT LIQD: 1.0000 | OROMUCOSAL | Status: DC | PRN

## 2013-01-12 MED ORDER — ALUM & MAG HYDROXIDE-SIMETH 200-200-20 MG/5ML PO SUSP: 15.0000 mL | ORAL | Status: DC | PRN

## 2013-01-12 MED ORDER — METOPROLOL TARTRATE 1 MG/ML IV SOLN: 2.0000 mg | INTRAVENOUS | Status: DC | PRN

## 2013-01-12 MED ORDER — HYDRALAZINE HCL 20 MG/ML IJ SOLN: 10.0000 mg | INTRAMUSCULAR | Status: DC | PRN

## 2013-01-12 MED ORDER — SODIUM CHLORIDE 0.9 % IV SOLN: 1.0000 mL/kg/h | INTRAVENOUS | Status: DC

## 2013-01-12 NOTE — Interval H&P Note (Signed)
History and Physical Interval Note:  01/12/2013 7:39 AM  David [name]  has presented today for surgery, with the diagnosis of pvd  The various methods of treatment have been discussed with the patient and family. After consideration of risks, benefits and other options for treatment, the patient has consented to  Procedure(s): ABDOMINAL AORTAGRAM (N/A) as a surgical intervention .  The patient's history has been reviewed, patient examined, no change in status, stable for surgery.  I have reviewed the patient's chart and labs.  Questions were answered to the patient's satisfaction.     BRABHAM IV, V. WELLS

## 2013-01-12 NOTE — Op Note (Signed)
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  ultrasound-guided access, right femoral artery  2.  abdominal aortogram  3.  bilateral lower extremity runoff  4.  third order catheterization  5.  balloon angioplasty, left superficial femoral/popliteal artery  6.  followup angiogram x2     Indications:  The patient has undergone multiple procedures for aneurysmal degeneration in the past. He is followed with surveillance ultrasound which showed elevated velocities in the stent on the left leg with a change in waveforms. He comes in for further evaluation and possible intervention.  Procedure:  The patient was identified in the holding area and taken to room 8.  The patient was then placed supine on the table and prepped and draped in the usual sterile fashion.  A time out was called.  Ultrasound was used to evaluate the right common femoral artery.  It was patent .  A digital ultrasound image was acquired.  A micropuncture needle was used to access the right common femoral artery under ultrasound guidance.  An 018 wire was advanced without resistance and a micropuncture sheath was placed.  The 018 wire was removed and a benson wire was placed.  The micropuncture sheath was exchanged for a 5 french sheath.  An omniflush catheter was advanced over the wire to the level of L-1.  An abdominal angiogram was obtained.  Next, the catheter was pulled down to the aortic bifurcation and bilateral lower extremity runoff was performed.  Findings:   Aortogram:  The suprarenal abdominal aorta shows no aneurysmal change or stenosis. Bilateral renal arteries are widely patent. The infrarenal abdominal aortic graft is widely patent. There is approximately a 50% stenosis at the origin of the left  common iliac artery. The right common iliac artery is patent throughout it's course. Bilateral external iliac arteries are widely patent.  Right Lower Extremity:  The right common femoral artery, profunda femoral artery are widely patent. The superficial femoral artery shows diffuse disease. There is approximately 50% stenosis just proximal to the popliteal stent within the distal superficial femoral artery. The popliteal stent is widely patent. There is three-vessel runoff to the ankle. The dominant vessel is the posterior tibial artery  Left Lower Extremity:  The left common femoral artery is widely patent. Left profunda femoral artery is widely patent. The origin of the left superficial femoral artery is patent with approximately 20-30% stenosis. A stent is visualized in the distal superficial femoral artery at the level of the proximal anastomosis. There is 70% stenosis within the stent particularly at the proximal and distal ends. The bypass graft is widely patent. The distal anastomosis is widely patent. There is three-vessel runoff. The posterior tibial is the dominant vessel across the ankle.  Intervention:  After the above images were acquired, the decision was made to proceed with intervention. Using a Glidewire and a 4 [place] straight catheter, access was gained into the left external iliac artery. A Amplatz superstiff wire was then placed. I then used a 6 [place] sheath and placed into the left common femoral artery. The patient was fully heparinized. Using a KMP catheter and a woolly wire, access was gained into the superficial femoral artery. The wire easily went through the stent and into the distal bypass graft. I elected initially to use a 6 x 20 balloon.  Balloon and plasty was performed throughout the entire length of the stent. A completion arteriogram was then performed which showed resolution of the stenosis within the stent, however there was a AV fistula present at the proximal portion of  the stent. I then changed to a 5 x 40 balloon and inflated the balloon within the stent and held for 2 minutes. Followup study then revealed improvement in the AV fistula with decreased flow, however it was evident that it was coming more from the proximal stent. I then reinserted the 5 x 40 balloon. It was placed just outside of the proximal portion of the stent, taken to profile, and held for 2 minutes. A final arteriogram was then performed which showed significantly decreased blood flow in the AV fistula. I felt that once the heparin wears off that this would resolve. There is significant improvement in blood flow through the bypass graft. Therefore, I elected to terminate the procedure. The sheath and wire were withdrawn to the right external iliac artery. The patient was taken to the holding area for sheath pull, once his coagulation profile corrects.  Impression:  #1  successful angioplasty of in-stent stenosis within the left leg bypass. This was done with a 6 mm balloon. There was an AV fistula present after the initial intervention which became very minimal after subsequent balloon inflations.  #2  50% stenosis within the origin of the left common iliac artery  #3  50% stenosis just proximal to the right SFA/popliteal stent   V. Durene Cal, M.D. [facility] Office: [phone] Pager:  [phone]

## 2013-01-12 NOTE — Telephone Encounter (Addendum)
Message copied by Rosalyn Charters on Tue Jan 12, 2013 11:52 AM ------      Message from: Melene Plan      Created: Tue Jan 12, 2013 10:26 AM                   ----- Message -----         From: Nada Libman, MD         Sent: 01/12/2013   9:14 AM           To: Reuel Derby, Melene Plan, RN            01/12/2013:            Surgeon:  Jorge Ny      Procedure Performed:       1.  ultrasound-guided access, right femoral artery       2.  abdominal aortogram       3.  bilateral lower extremity runoff       4.  third order catheterization       5.  balloon angioplasty, left superficial femoral/popliteal artery       6.  followup angiogram x2   Notified patient of fu appt with dr. Myra Gianotti on 04-19-13 at 8:30 am        ------

## 2013-01-12 NOTE — H&P (View-Only) (Signed)
[facility]   Patient name: David [name] MRN: [id number] DOB: 02/12/1951 Sex: male     Chief Complaint  Patient presents with  . Re-evaluation    6 month abi's and LE art     HISTORY OF PRESENT ILLNESS: The patient comes back today for followup. He has undergone open abdominal aortic aneurysm repair by Dr. Hayes in 2012. 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 He had his left popliteal aneurysm repaired by above-knee to below-knee bypass in October of 2010 he's had a midline abdominal hernia repair by Dr. gross. He had elevated velocities in the proximal anastomosis of his bypass graft and underwent arteriogram which revealed a high-grade stenosis extending into his bypass graft balloon angioplasty was performed however this resulted in a dissection and he therefore underwent stenting using a 6 mm stent he is back today for followup. We have been following velocity elevations at the stent on the left leg. He reports no symptoms of claudication. He reports no ulceration. He continues to take his Plavix.   Past Medical History  Diagnosis Date  . Diverticulosis   . Hemorrhoids   . Hx of adenomatous colonic polyps   . Hyperlipidemia   . Hypertension   . CVA (cerebral vascular accident) 2003  . Anticardiolipin antibody positive   . Perineal abscess   . Diabetes mellitus     type II  . GERD (gastroesophageal reflux disease)   . Peripheral vascular disease     peripheral vascular disease/carotid artery <100%  . Anxiety   . Depression   . 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 CEREBROVASCULAR ACCIDENT, HX OF 07/19/2008  . ANXIETY 07/19/2008  . DEPRESSION 07/19/2008  . DIABETES MELLITUS, TYPE II 01/18/2008  . GERD 07/19/2008  . HYPERLIPIDEMIA 07/19/2008  . HYPERTENSION 07/31/2009  . Increased prostate specific antigen (PSA) velocity 09/13/2010  . Unspecified Peripheral Vascular Disease 01/18/2008  . PERSONAL HX COLONIC POLYPS  12/10/2007  . AAA (abdominal aortic aneurysm)     Past Surgical History  Procedure Laterality Date  . Hernia surgury      62 years old  . Perineal abscess    . Sp endo repair infraren aaa    . S/p right and left popliteal aneurysm repair    . 7 abdominal hernia repairs  05/31/10  . Colonoscopy    . Polypectomy    . Abdominal aortic aneurysm repair      History   Social History  . Marital Status: Married    Spouse Name: N/A    Number of Children: 1  . Years of Education: N/A   Occupational History  . dsiabled due to stroke    Social History Main Topics  . Smoking status: Former Smoker    Types: Cigarettes    Quit date: 01/14/1992  . Smokeless tobacco: Never Used  . Alcohol Use: 1.2 oz/week    2 Glasses of wine per week     Comment: wine daily  . Drug Use: No  . Sexually Active: Not on file   Other Topics Concern  . Not on file   Social History Narrative   2 cups of coffee in the morning and tea throughout the day.    Family History  Problem Relation Age of Onset  . Aortic aneurysm Mother     desceding aoritic aneurysm  . Cancer Brother     Lung cancer    Allergies as of 12/14/2012 - Review Complete   12/14/2012  Allergen Reaction Noted  . Latex Rash 08/03/2010    Current Outpatient Prescriptions on File Prior to Visit  Medication Sig Dispense Refill  . ascorbic Acid (VITAMIN C CR) 500 MG CPCR Take 500 mg by mouth 2 (two) times daily.        . aspirin 81 MG tablet Take 81 mg by mouth daily.        . atorvastatin (LIPITOR) 40 MG tablet Take 1 tablet (40 mg total) by mouth daily.  90 tablet  3  . cetirizine (ZYRTEC) 10 MG tablet Take 10 mg by mouth daily.        . clopidogrel (PLAVIX) 75 MG tablet Take 1 tablet (75 mg total) by mouth daily.  90 tablet  3  . desoximetasone (TOPICORT) 0.25 % cream Apply 0.25 % topically as needed.      . docusate sodium (COLACE) 100 MG capsule Take 100 mg by mouth 2 (two) times daily.        . Fiber POWD Take by mouth daily.         . fluocinonide (LIDEX) 0.05 % external solution Apply 60 g topically as needed.      . folic acid (FOLVITE) 1 MG tablet Take 1 tablet (1 mg total) by mouth daily.  90 tablet  3  . gemfibrozil (LOPID) 600 MG tablet Take 1 tablet (600 mg total) by mouth 2 (two) times daily.  180 tablet  3  . mesalamine (CANASA) 1000 MG suppository Place 1 suppository (1,000 mg total) rectally 2 (two) times daily as needed. One suppository into rectum two times a day as needed  60 suppository  0  . metFORMIN (GLUCOPHAGE-XR) 500 MG 24 hr tablet Take 2 tablets (1,000 mg total) by mouth daily with breakfast.  180 tablet  3  . Multiple Vitamin (MULTIVITAMIN) capsule Take 1 capsule by mouth daily.        . olmesartan-hydrochlorothiazide (BENICAR HCT) 40-25 MG per tablet Take 1 tablet by mouth daily.  90 tablet  3  . Omega-3 Fatty Acids (FISH OIL PO) Take by mouth daily.        . ranitidine (ZANTAC) 150 MG tablet Take 1 tablet (150 mg total) by mouth 2 (two) times daily.  60 tablet  11   No current facility-administered medications on file prior to visit.     REVIEW OF SYSTEMS: No change from prior  PHYSICAL EXAMINATION:   Vital signs are BP 160/87  Pulse 64  Ht 5' 10" (1.778 m)  Wt 242 lb (109.77 kg)  BMI 34.72 kg/m2  SpO2 100% General: The patient appears their stated age. HEENT:  No gross abnormalities Pulmonary:  Non labored breathing Abdomen: Soft and non-tender Musculoskeletal: There are no major deformities. Neurologic: No focal weakness or paresthesias are detected, Skin: There are no ulcer or rashes noted. Psychiatric: The patient has normal affect. Cardiovascular: There is a regular rate and rhythm without significant murmur appreciated. I cannot palpate pedal pulses today. He does have bilateral edema  Diagnostic Studies Carotid duplex: Known occlusion of the left carotid artery. He has less than 40% right carotid stenosis  Lower extremity duplex: Peak velocity within the stent is 3 30  cm/s. The patient has gone from triphasic to monophasic waveforms within his bypass graft, however he does have triphasic waveforms in his posterior tibial artery at the ankle. This been a slight decrease in the ABI. Previously was 0.93 on the left, today to 0.85. On the right ABIs remained greater than   1 triphasic waveforms  Assessment: Status post abdominal aneurysm repair, percutaneous right popliteal aneurysm repair, and left leg bypass for left popliteal aneurysm Plan: The patient has stable carotid disease on the right. He has a known left carotid occlusion. He remained asymptomatic, this we followed up on a yearly basis.  Lower extremity: The patient has had a change in his waveforms from triphasic to monophasic, within the bypass graft. Although the velocity profile within the stent remained stable, and concern about the lower velocity waveforms within the graft, I'm concerned that this may be a graft threatening situation. For that reason, I feel that he needs to undergo angiography to better evaluate this area and possible percutaneous treatment showed a lesion be identified. This is been scheduled for Tuesday, September 9. I will plan on access within the right groin, studying both legs and intervening on the left is necessary.  V. Wells Brabham IV, M.D. [facility] Office: [phone] Pager:  [phone]    

## 2013-03-11 ENCOUNTER — Other Ambulatory Visit: Payer: Self-pay

## 2013-03-16 ENCOUNTER — Other Ambulatory Visit: Payer: Self-pay | Admitting: Surgery

## 2013-03-16 ENCOUNTER — Ambulatory Visit: Payer: No Typology Code available for payment source | Admitting: Neurosurgery

## 2013-03-16 ENCOUNTER — Other Ambulatory Visit: Payer: No Typology Code available for payment source

## 2013-03-16 DIAGNOSIS — I739 Peripheral vascular disease, unspecified: Secondary | ICD-10-CM

## 2013-03-16 DIAGNOSIS — I724 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: Secondary | ICD-10-CM

## 2013-03-16 DIAGNOSIS — I714 Abdominal aortic aneurysm, without rupture: Secondary | ICD-10-CM

## 2013-03-16 DIAGNOSIS — Z48812 Encounter for surgical aftercare following surgery on the circulatory system: Secondary | ICD-10-CM

## 2013-04-16 ENCOUNTER — Encounter: Payer: Self-pay | Admitting: Surgery

## 2013-04-19 ENCOUNTER — Ambulatory Visit (HOSPITAL_COMMUNITY)
Admission: RE | Admit: 2013-04-19 | Discharge: 2013-04-19 | Disposition: A | Discharge status: Home / Self Care | Payer: No Typology Code available for payment source | Source: Ambulatory Visit | Attending: Surgery | Admitting: Surgery

## 2013-04-19 ENCOUNTER — Encounter (HOSPITAL_COMMUNITY): Payer: No Typology Code available for payment source

## 2013-04-19 ENCOUNTER — Ambulatory Visit (INDEPENDENT_AMBULATORY_CARE_PROVIDER_SITE_OTHER)
Admission: RE | Admit: 2013-04-19 | Discharge: 2013-04-19 | Disposition: A | Discharge status: Home / Self Care | Payer: No Typology Code available for payment source | Source: Ambulatory Visit | Attending: Surgery | Admitting: Surgery

## 2013-04-19 ENCOUNTER — Ambulatory Visit (INDEPENDENT_AMBULATORY_CARE_PROVIDER_SITE_OTHER): Payer: No Typology Code available for payment source | Admitting: Surgery

## 2013-04-19 ENCOUNTER — Encounter: Payer: Self-pay | Admitting: Surgery

## 2013-04-19 VITALS — BP 136/86 | HR 77 | Resp 18 | Ht 70.0 in | Wt 233.0 lb

## 2013-04-19 DIAGNOSIS — I714 Abdominal aortic aneurysm, without rupture, unspecified: Secondary | ICD-10-CM | POA: Insufficient documentation

## 2013-04-19 DIAGNOSIS — I724 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: Secondary | ICD-10-CM

## 2013-04-19 DIAGNOSIS — Z48812 Encounter for surgical aftercare following surgery on the circulatory system: Secondary | ICD-10-CM

## 2013-04-19 DIAGNOSIS — I6529 Occlusion and stenosis of unspecified carotid artery: Secondary | ICD-10-CM

## 2013-04-19 DIAGNOSIS — I739 Peripheral vascular disease, unspecified: Secondary | ICD-10-CM

## 2013-04-19 NOTE — Addendum Note (Signed)
Addended by: Sharee Pimple on: 04/19/2013 03:38 PM   Modules accepted: Orders

## 2013-04-19 NOTE — Progress Notes (Signed)
Patient name: ACEA YAGI MRN: [id number] DOB: November 04, 1950 Sex: male     Chief Complaint  Patient presents with  . AAA    3 month aaa f/u  lab prior  . PVD    HISTORY OF PRESENT ILLNESS: The patient comes back today for followup. He has undergone open abdominal aortic aneurysm repair by Dr. Madilyn Fireman in 2012. 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 He had his left popliteal aneurysm repaired by above-knee to below-knee bypass in October of 2010 he's had a midline abdominal hernia repair by Dr. gross. He had elevated velocities in the proximal anastomosis of his bypass graft and underwent arteriogram which revealed a high-grade stenosis extending into his bypass graft balloon angioplasty was performed however this resulted in a dissection and he therefore underwent stenting using a 6 mm stent.  He was found to have and stent stenosis on the left.  On 01/12/2013 he underwent angiography with a successful balloon angioplasty of the stent using a 6 mm balloon.  He's back today without complaints.   Past Medical History  Diagnosis Date  . Diverticulosis   . Hemorrhoids   . Hx of adenomatous colonic polyps   . Hyperlipidemia   . Hypertension   . CVA (cerebral vascular accident) 2003  . Anticardiolipin antibody positive   . Perineal abscess   . Diabetes mellitus     type II  . GERD (gastroesophageal reflux disease)   . Peripheral vascular disease     peripheral vascular disease/carotid artery <100%  . Anxiety   . Depression   . 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 CEREBROVASCULAR ACCIDENT, HX OF 07/19/2008  . ANXIETY 07/19/2008  . DEPRESSION 07/19/2008  . DIABETES MELLITUS, TYPE II 01/18/2008  . GERD 07/19/2008  . HYPERLIPIDEMIA 07/19/2008  . HYPERTENSION 07/31/2009  . Increased prostate specific antigen (PSA) velocity 09/13/2010  . Unspecified Peripheral Vascular Disease 01/18/2008  . PERSONAL HX COLONIC POLYPS 12/10/2007  . AAA (abdominal aortic  aneurysm)   . Varicose veins     Past Surgical History  Procedure Laterality Date  . Hernia surgury      62 years old  . Perineal abscess    . Sp endo repair infraren aaa    . S/p right and left popliteal aneurysm repair    . 7 abdominal hernia repairs  05/31/10  . Colonoscopy    . Polypectomy    . Abdominal aortic aneurysm repair      History   Social History  . Marital Status: Married    Spouse Name: N/A    Number of Children: 1  . Years of Education: N/A   Occupational History  . dsiabled due to stroke    Social History Main Topics  . Smoking status: Former Smoker    Types: Cigarettes    Quit date: 01/14/1992  . Smokeless tobacco: Never Used  . Alcohol Use: 1.2 oz/week    2 Glasses of wine per week     Comment: wine daily  . Drug Use: No  . Sexual Activity: Not on file   Other Topics Concern  . Not on file   Social History Narrative   2 cups of coffee in the morning and tea throughout the day.    Family History  Problem Relation Age of Onset  . Aortic aneurysm Mother     desceding aoritic aneurysm  . Cancer Brother     Lung cancer    Allergies as  of 04/19/2013 - Review Complete 04/19/2013  Allergen Reaction Noted  . Latex Rash 08/03/2010    Current Outpatient Prescriptions on File Prior to Visit  Medication Sig Dispense Refill  . ascorbic Acid (VITAMIN C CR) 500 MG CPCR Take 500 mg by mouth daily.       Marland Kitchen aspirin EC 81 MG tablet Take 81 mg by mouth daily.      Marland Kitchen atorvastatin (LIPITOR) 40 MG tablet Take 1 tablet (40 mg total) by mouth daily.  90 tablet  3  . cetirizine (ZYRTEC) 10 MG tablet Take 10 mg by mouth daily.       . clopidogrel (PLAVIX) 75 MG tablet Take 1 tablet (75 mg total) by mouth daily.  90 tablet  3  . docusate sodium (COLACE) 100 MG capsule Take 100 mg by mouth 2 (two) times daily.       . Fiber CAPS Take 5.7 mg by mouth daily. Take (10) 0.57 capsules by mouth daily for regularity.      . folic acid (FOLVITE) 1 MG tablet Take 1  tablet (1 mg total) by mouth daily.  90 tablet  3  . gemfibrozil (LOPID) 600 MG tablet Take 1,200 mg by mouth daily.      . metFORMIN (GLUCOPHAGE-XR) 500 MG 24 hr tablet Take 2,000 mg by mouth daily. 4 tabs by mouth in the AM      . Multiple Vitamin (MULTIVITAMIN) capsule Take 1 capsule by mouth daily.       Marland Kitchen olmesartan-hydrochlorothiazide (BENICAR HCT) 40-25 MG per tablet Take 1 tablet by mouth daily.      . Omega-3 Fatty Acids (FISH OIL PO) Take 2 capsules by mouth daily.       . ranitidine (ZANTAC) 150 MG tablet Take 1 tablet (150 mg total) by mouth 2 (two) times daily.  60 tablet  11   No current facility-administered medications on file prior to visit.     REVIEW OF SYSTEMS: Cardiovascular: No chest pain, chest pressure, palpitations, orthopnea, or dyspnea on exertion. No claudication or rest pain,  No history of DVT or phlebitis. Pulmonary: No productive cough, asthma or wheezing. Neurologic: No weakness, paresthesias, aphasia, or amaurosis. No dizziness. Hematologic: No bleeding problems or clotting disorders. Musculoskeletal: No joint pain or joint swelling. Gastrointestinal: No blood in stool or hematemesis Genitourinary: No dysuria or hematuria. Psychiatric:: No history of major depression. Integumentary: No rashes or ulcers. Constitutional: No fever or chills.  PHYSICAL EXAMINATION:   Vital signs are BP 136/86  Pulse 77  Resp 18  Ht 5\' 10"  (1.778 m)  Wt 233 lb (105.688 kg)  BMI 33.43 kg/m2 General: The patient appears their stated age. HEENT:  No gross abnormalities Pulmonary:  Non labored breathing Musculoskeletal: There are no major deformities. Neurologic: No focal weakness or paresthesias are detected, Skin: There are no ulcer or rashes noted. Psychiatric: The patient has normal affect. Cardiovascular: Palpable pedal pulses   Diagnostic Studies Following ultrasounds have been ordered and reviewed Lower extremity: Widely patent lower extremity stents.  There  are no elevated velocities.  ABIs are greater than 1 bilaterally with triphasic waveforms. Aortic ultrasound: No evidence of aneurysmal degeneration:  Assessment: Status post open abdominal aortic aneurysm repair, stent, 2 right popliteal aneurysm, open repair of left popliteal aneurysm with proximal anastomotic narrowing treated with stenting. Plan: The patient is doing very well at this time.  His ultrasounds also excellent results.  He will come back in 6 months for followup.  He'll also have a  carotid ultrasound at that visit.  Jorge Ny, M.D. [facility] Office: [phone] Pager:  [phone]

## 2013-06-30 ENCOUNTER — Ambulatory Visit: Payer: No Typology Code available for payment source | Admitting: Internal Medicine

## 2013-07-19 ENCOUNTER — Other Ambulatory Visit (INDEPENDENT_AMBULATORY_CARE_PROVIDER_SITE_OTHER): Payer: No Typology Code available for payment source

## 2013-07-19 ENCOUNTER — Telehealth: Payer: Self-pay | Admitting: *Deleted

## 2013-07-19 DIAGNOSIS — Z125 Encounter for screening for malignant neoplasm of prostate: Secondary | ICD-10-CM

## 2013-07-19 DIAGNOSIS — Z0000 Encounter for general adult medical examination without abnormal findings: Secondary | ICD-10-CM

## 2013-07-19 LAB — HEPATIC FUNCTION PANEL
ALBUMIN: 4.3 g/dL (ref 3.5–5.2)
ALK PHOS: 32 U/L — AB (ref 39–117)
ALT: 45 U/L (ref 0–53)
AST: 46 U/L — ABNORMAL HIGH (ref 0–37)
BILIRUBIN TOTAL: 0.5 mg/dL (ref 0.3–1.2)
Bilirubin, Direct: 0.1 mg/dL (ref 0.0–0.3)
Total Protein: 7.6 g/dL (ref 6.0–8.3)

## 2013-07-19 LAB — BASIC METABOLIC PANEL
BUN: 16 mg/dL (ref 6–23)
CO2: 28 mEq/L (ref 19–32)
Calcium: 9.8 mg/dL (ref 8.4–10.5)
Chloride: 99 mEq/L (ref 96–112)
Creatinine, Ser: 0.9 mg/dL (ref 0.4–1.5)
GFR: 93.66 mL/min (ref >60.00)
Glucose, Bld: 283 mg/dL — ABNORMAL HIGH (ref 70–99)
POTASSIUM: 4.3 meq/L (ref 3.5–5.1)
Sodium: 134 mEq/L — ABNORMAL LOW (ref 135–145)

## 2013-07-19 LAB — CBC WITH DIFFERENTIAL/PLATELET
BASOS ABS: 0.1 10*3/uL (ref 0.0–0.1)
Basophils Relative: 1.3 % (ref 0.0–3.0)
EOS ABS: 0.3 10*3/uL (ref 0.0–0.7)
Eosinophils Relative: 8 % — ABNORMAL HIGH (ref 0.0–5.0)
HCT: 37.9 % — ABNORMAL LOW (ref 39.0–52.0)
Hemoglobin: 13.1 g/dL (ref 13.0–17.0)
LYMPHS PCT: 23.8 % (ref 12.0–46.0)
Lymphs Abs: 1 10*3/uL (ref 0.7–4.0)
MCHC: 34.6 g/dL (ref 30.0–36.0)
MCV: 92.3 fl (ref 78.0–100.0)
Monocytes Absolute: 0.4 10*3/uL (ref 0.1–1.0)
Monocytes Relative: 10.2 % (ref 3.0–12.0)
NEUTROS PCT: 56.7 % (ref 43.0–77.0)
Neutro Abs: 2.5 10*3/uL (ref 1.4–7.7)
Platelets: 218 10*3/uL (ref 150.0–400.0)
RBC: 4.1 Mil/uL — ABNORMAL LOW (ref 4.22–5.81)
RDW: 12.2 % (ref 11.5–14.6)
WBC: 4.4 10*3/uL — ABNORMAL LOW (ref 4.5–10.5)

## 2013-07-19 LAB — URINALYSIS, ROUTINE W REFLEX MICROSCOPIC
Bilirubin Urine: NEGATIVE
Hgb urine dipstick: NEGATIVE
KETONES UR: NEGATIVE
LEUKOCYTES UA: NEGATIVE
NITRITE: NEGATIVE
PH: 6 (ref 5.0–8.0)
RBC / HPF: NONE SEEN (ref 0–?)
SPECIFIC GRAVITY, URINE: 1.015 (ref 1.000–1.030)
Total Protein, Urine: 30 — AB
Urine Glucose: NEGATIVE
Urobilinogen, UA: 0.2 (ref 0.0–1.0)

## 2013-07-19 LAB — PSA: PSA: 1.66 ng/mL (ref 0.10–4.00)

## 2013-07-19 LAB — LIPID PANEL
CHOL/HDL RATIO: 4
CHOLESTEROL: 124 mg/dL (ref 0–200)
HDL: 30.2 mg/dL — ABNORMAL LOW (ref >39.00)
LDL CALC: 23 mg/dL (ref 0–99)
Triglycerides: 354 mg/dL — ABNORMAL HIGH (ref 0.0–149.0)
VLDL: 70.8 mg/dL — AB (ref 0.0–40.0)

## 2013-07-19 LAB — TSH: TSH: 1.73 u[IU]/mL (ref 0.35–5.50)

## 2013-07-19 NOTE — Telephone Encounter (Signed)
Pt is in the lab to have his cpx labs done no orders in comp...David [name]

## 2013-07-21 ENCOUNTER — Other Ambulatory Visit (INDEPENDENT_AMBULATORY_CARE_PROVIDER_SITE_OTHER): Payer: No Typology Code available for payment source

## 2013-07-21 ENCOUNTER — Ambulatory Visit (INDEPENDENT_AMBULATORY_CARE_PROVIDER_SITE_OTHER): Payer: No Typology Code available for payment source | Admitting: Internal Medicine

## 2013-07-21 ENCOUNTER — Encounter: Payer: Self-pay | Admitting: Internal Medicine

## 2013-07-21 ENCOUNTER — Other Ambulatory Visit: Payer: Self-pay | Admitting: Internal Medicine

## 2013-07-21 VITALS — BP 138/82 | HR 71 | Temp 97.1°F | Ht 70.0 in | Wt 241.0 lb

## 2013-07-21 DIAGNOSIS — Z0000 Encounter for general adult medical examination without abnormal findings: Secondary | ICD-10-CM

## 2013-07-21 DIAGNOSIS — Z23 Encounter for immunization: Secondary | ICD-10-CM

## 2013-07-21 DIAGNOSIS — E119 Type 2 diabetes mellitus without complications: Secondary | ICD-10-CM

## 2013-07-21 LAB — HEMOGLOBIN A1C: Hgb A1c MFr Bld: 9.3 % — ABNORMAL HIGH (ref 4.6–6.5)

## 2013-07-21 MED ORDER — GEMFIBROZIL 600 MG PO TABS: 1200.0000 mg | ORAL_TABLET | Freq: Every day | ORAL | Status: DC

## 2013-07-21 MED ORDER — PIOGLITAZONE HCL 45 MG PO TABS: 45.0000 mg | ORAL_TABLET | Freq: Every day | ORAL | Status: DC

## 2013-07-21 MED ORDER — FOLIC ACID 1 MG PO TABS: 1.0000 mg | ORAL_TABLET | Freq: Every day | ORAL | Status: DC

## 2013-07-21 MED ORDER — CETIRIZINE HCL 10 MG PO TABS: 10.0000 mg | ORAL_TABLET | Freq: Every day | ORAL | Status: DC

## 2013-07-21 MED ORDER — CLOPIDOGREL BISULFATE 75 MG PO TABS: 75.0000 mg | ORAL_TABLET | Freq: Every day | ORAL | Status: DC

## 2013-07-21 MED ORDER — METFORMIN HCL ER 500 MG PO TB24: 2000.0000 mg | ORAL_TABLET | Freq: Every day | ORAL | Status: DC

## 2013-07-21 MED ORDER — ATORVASTATIN CALCIUM 40 MG PO TABS: 40.0000 mg | ORAL_TABLET | Freq: Every day | ORAL | Status: DC

## 2013-07-21 NOTE — Addendum Note (Signed)
Addended by: Sharon Seller B on: 07/21/2013 09:59 AM   Modules accepted: Orders

## 2013-07-21 NOTE — Patient Instructions (Signed)
You had the Pnuemovax shot today (so the new Prevnar pneumonia shot for next year)  Please continue all other medications as before, and refills have been done if requested. Please have the pharmacy call with any other refills you may need.  Please continue your efforts at being more active, low cholesterol diet, and weight control. You are otherwise up to date with prevention measures today.  Please keep your appointments with your specialists as you have planned  Please go to the LAB in the Basement (turn left off the elevator) for the tests to be done today You will be contacted by phone if any changes need to be made immediately.  Otherwise, you will receive a letter about your results with an explanation, but please check with MyChart first.  Please return in 6 months, or sooner if needed, with Lab testing done 3-5 days before

## 2013-07-21 NOTE — Progress Notes (Signed)
Subjective:    Patient ID: David [name], male    DOB: 07/28/1950, 63 y.o.   MRN: [id number]  HPI  Here for wellness and f/u;  Overall doing ok;  Pt denies CP, worsening SOB, DOE, wheezing, orthopnea, PND, worsening LE edema, palpitations, dizziness or syncope.  Pt denies neurological change such as new headache, facial or extremity weakness.  Pt denies polydipsia, polyuria, or low sugar symptoms. Pt states overall good compliance with treatment and medications, good tolerability, and has been trying to follow lower cholesterol diet.  Pt denies worsening depressive symptoms, suicidal ideation or panic. No fever, night sweats, wt loss, loss of appetite, or other constitutional symptoms.  Pt states good ability with ADL's, has low fall risk, home safety reviewed and adequate, no other significant changes in hearing or vision, and only occasionally active with exercise. No recent claudication symptoms after left stent stenosis resolved. Past Medical History  Diagnosis Date  . Diverticulosis   . Hemorrhoids   . Hx of adenomatous colonic polyps   . Hyperlipidemia   . Hypertension   . CVA (cerebral vascular accident) 2003  . Anticardiolipin antibody positive   . Perineal abscess   . Diabetes mellitus     type II  . GERD (gastroesophageal reflux disease)   . Peripheral vascular disease     peripheral vascular disease/carotid artery <100%  . Anxiety   . Depression   . 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 CEREBROVASCULAR ACCIDENT, HX OF 07/19/2008  . ANXIETY 07/19/2008  . DEPRESSION 07/19/2008  . DIABETES MELLITUS, TYPE II 01/18/2008  . GERD 07/19/2008  . HYPERLIPIDEMIA 07/19/2008  . HYPERTENSION 07/31/2009  . Increased prostate specific antigen (PSA) velocity 09/13/2010  . Unspecified Peripheral Vascular Disease 01/18/2008  . PERSONAL HX COLONIC POLYPS 12/10/2007  . AAA (abdominal aortic aneurysm)   . Varicose veins    Past Surgical History  Procedure Laterality Date  . Hernia  surgury      63 years old  . Perineal abscess    . Sp endo repair infraren aaa    . S/p right and left popliteal aneurysm repair    . 7 abdominal hernia repairs  05/31/10  . Colonoscopy    . Polypectomy    . Abdominal aortic aneurysm repair      reports that he quit smoking about 21 years ago. His smoking use included Cigarettes. He smoked 0.00 packs per day. He has never used smokeless tobacco. He reports that he drinks about 1.2 ounces of alcohol per week. He reports that he does not use illicit drugs. family history includes Aortic aneurysm in his mother; Cancer in his brother. Allergies  Allergen Reactions  . Latex Rash   Current Outpatient Prescriptions on File Prior to Visit  Medication Sig Dispense Refill  . ascorbic Acid (VITAMIN C CR) 500 MG CPCR Take 500 mg by mouth daily.       Marland Kitchen aspirin EC 81 MG tablet Take 81 mg by mouth daily.      Marland Kitchen docusate sodium (COLACE) 100 MG capsule Take 100 mg by mouth 2 (two) times daily.       . Fiber CAPS Take 5.7 mg by mouth daily. Take (10) 0.57 capsules by mouth daily for regularity.      . Multiple Vitamin (MULTIVITAMIN) capsule Take 1 capsule by mouth daily.       Marland Kitchen olmesartan-hydrochlorothiazide (BENICAR HCT) 40-25 MG per tablet Take 1 tablet by mouth daily.      Marland Kitchen  Omega-3 Fatty Acids (FISH OIL PO) Take 2 capsules by mouth daily.       . ranitidine (ZANTAC) 150 MG tablet Take 1 tablet (150 mg total) by mouth 2 (two) times daily.  60 tablet  11   No current facility-administered medications on file prior to visit.   Review of Systems Constitutional: Negative for diaphoresis, activity change, appetite change or unexpected weight change.  HENT: Negative for hearing loss, ear pain, facial swelling, mouth sores and neck stiffness.   Eyes: Negative for pain, redness and visual disturbance.  Respiratory: Negative for shortness of breath and wheezing.   Cardiovascular: Negative for chest pain and palpitations.  Gastrointestinal: Negative for  diarrhea, blood in stool, abdominal distention or other pain Genitourinary: Negative for hematuria, flank pain or change in urine volume.  Musculoskeletal: Negative for myalgias and joint swelling.  Skin: Negative for color change and wound.  Neurological: Negative for syncope and numbness. other than noted Hematological: Negative for adenopathy.  Psychiatric/Behavioral: Negative for hallucinations, self-injury, decreased concentration and agitation.      Objective:   Physical Exam BP 138/82  Pulse 71  Temp(Src) 97.1 F (36.2 C) (Oral)  Ht 5\' 10"  (1.778 m)  Wt 241 lb (109.317 kg)  BMI 34.58 kg/m2  SpO2 97% VS noted,  Constitutional: Pt is oriented to person, place, and time. Appears well-developed and well-nourished.  Head: Normocephalic and atraumatic.  Right Ear: External ear normal.  Left Ear: External ear normal.  Nose: Nose normal.  Mouth/Throat: Oropharynx is clear and moist.  Eyes: Conjunctivae and EOM are normal. Pupils are equal, round, and reactive to light.  Neck: Normal range of motion. Neck supple. No JVD present. No tracheal deviation present.  Cardiovascular: Normal rate, regular rhythm, normal heart sounds and intact distal pulses.   Pulmonary/Chest: Effort normal and breath sounds normal.  Abdominal: Soft. Bowel sounds are normal. There is no tenderness. No HSM  Musculoskeletal: Normal range of motion. Exhibits no edema.  Lymphadenopathy:  Has no cervical adenopathy.  Neurological: Pt is alert and oriented to person, place, and time. Pt has normal reflexes. No cranial nerve deficit.  Skin: Skin is warm and dry. No rash noted.  Psychiatric:  Has  normal mood and affect. Behavior is normal.     Assessment & Plan:

## 2013-07-21 NOTE — Progress Notes (Signed)
Pre visit review using our clinic review tool, if applicable. No additional management support is needed unless otherwise documented below in the visit note. 

## 2013-07-21 NOTE — Assessment & Plan Note (Signed)
For some reason did not have a1c done wit labs, will order, with f/u 6 mo

## 2013-07-21 NOTE — Assessment & Plan Note (Signed)

## 2013-07-22 ENCOUNTER — Encounter: Payer: Self-pay | Admitting: Internal Medicine

## 2013-07-22 ENCOUNTER — Other Ambulatory Visit: Payer: Self-pay

## 2013-07-22 MED ORDER — OLMESARTAN MEDOXOMIL-HCTZ 40-25 MG PO TABS: 1.0000 | ORAL_TABLET | Freq: Every day | ORAL | Status: DC

## 2013-07-23 ENCOUNTER — Telehealth: Payer: Self-pay

## 2013-07-23 NOTE — Telephone Encounter (Signed)
Fax clarification on [facility] requesting if ok to take both Actos and Genfibrozil as interaction.  Advise please.

## 2013-07-23 NOTE — Telephone Encounter (Signed)
Ok to take as has been doing well

## 2013-07-23 NOTE — Telephone Encounter (Signed)
acutally the actos is new, but still ok to take

## 2013-07-23 NOTE — Telephone Encounter (Signed)
Pharmacy informed.

## 2013-08-06 ENCOUNTER — Encounter: Payer: Self-pay | Admitting: Internal Medicine

## 2013-10-15 ENCOUNTER — Encounter: Payer: Self-pay | Admitting: Surgery

## 2013-10-18 ENCOUNTER — Ambulatory Visit (INDEPENDENT_AMBULATORY_CARE_PROVIDER_SITE_OTHER)
Admission: RE | Admit: 2013-10-18 | Discharge: 2013-10-18 | Disposition: A | Discharge status: Home / Self Care | Payer: No Typology Code available for payment source | Source: Ambulatory Visit | Attending: Surgery | Admitting: Surgery

## 2013-10-18 ENCOUNTER — Ambulatory Visit (INDEPENDENT_AMBULATORY_CARE_PROVIDER_SITE_OTHER): Payer: No Typology Code available for payment source | Admitting: Surgery

## 2013-10-18 ENCOUNTER — Other Ambulatory Visit: Payer: Self-pay | Admitting: Surgery

## 2013-10-18 ENCOUNTER — Encounter: Payer: Self-pay | Admitting: Surgery

## 2013-10-18 ENCOUNTER — Encounter (HOSPITAL_COMMUNITY): Payer: No Typology Code available for payment source

## 2013-10-18 ENCOUNTER — Other Ambulatory Visit (HOSPITAL_COMMUNITY): Payer: No Typology Code available for payment source

## 2013-10-18 ENCOUNTER — Ambulatory Visit (HOSPITAL_COMMUNITY)
Admission: RE | Admit: 2013-10-18 | Discharge: 2013-10-18 | Disposition: A | Discharge status: Home / Self Care | Payer: No Typology Code available for payment source | Source: Ambulatory Visit | Attending: Surgery | Admitting: Surgery

## 2013-10-18 VITALS — BP 148/77 | HR 51 | Resp 18 | Ht 70.0 in | Wt 245.3 lb

## 2013-10-18 DIAGNOSIS — I6529 Occlusion and stenosis of unspecified carotid artery: Secondary | ICD-10-CM

## 2013-10-18 DIAGNOSIS — I714 Abdominal aortic aneurysm, without rupture, unspecified: Secondary | ICD-10-CM

## 2013-10-18 DIAGNOSIS — Z48812 Encounter for surgical aftercare following surgery on the circulatory system: Secondary | ICD-10-CM | POA: Insufficient documentation

## 2013-10-18 DIAGNOSIS — I739 Peripheral vascular disease, unspecified: Secondary | ICD-10-CM

## 2013-10-18 DIAGNOSIS — I6523 Occlusion and stenosis of bilateral carotid arteries: Secondary | ICD-10-CM

## 2013-10-18 DIAGNOSIS — I63239 Cerebral infarction due to unspecified occlusion or stenosis of unspecified carotid arteries: Secondary | ICD-10-CM | POA: Insufficient documentation

## 2013-10-18 NOTE — Addendum Note (Signed)
Addended by: Mena Goes on: 10/18/2013 02:47 PM   Modules accepted: Orders

## 2013-10-18 NOTE — Progress Notes (Signed)
Patient name: David [name] MRN: [id number] DOB: 09-01-1950 Sex: male     Chief Complaint  Patient presents with  . Follow-up    6 month FU   . AAA  . PVD  . Carotid    HISTORY OF PRESENT ILLNESS: The patient comes back today for followup. He has undergone open abdominal aortic aneurysm repair by Dr. Amedeo Plenty in 2012. 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 He had his left popliteal aneurysm repaired by above-knee to below-knee bypass in October of 2010 he's had a midline abdominal hernia repair by Dr. gross. He had elevated velocities in the proximal anastomosis of his bypass graft and underwent arteriogram which revealed a high-grade stenosis extending into his bypass graft balloon angioplasty was performed however this resulted in a dissection and he therefore underwent stenting using a 6 mm stent. He was found to have in-stent stenosis on the left. On 01/12/2013 he underwent angiography with a successful balloon angioplasty of the stent using a 6 mm balloon. He's back today without complaints.  He reports no claudication symptoms     Past Medical History  Diagnosis Date  . Diverticulosis   . Hemorrhoids   . Hx of adenomatous colonic polyps   . Hyperlipidemia   . Hypertension   . CVA (cerebral vascular accident) 2003  . Anticardiolipin antibody positive   . Perineal abscess   . Diabetes mellitus     type II  . GERD (gastroesophageal reflux disease)   . Peripheral vascular disease     peripheral vascular disease/carotid artery <100%  . Anxiety   . Depression   . 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 CEREBROVASCULAR ACCIDENT, HX OF 07/19/2008  . ANXIETY 07/19/2008  . DEPRESSION 07/19/2008  . DIABETES MELLITUS, TYPE II 01/18/2008  . GERD 07/19/2008  . HYPERLIPIDEMIA 07/19/2008  . HYPERTENSION 07/31/2009  . Increased prostate specific antigen (PSA) velocity 09/13/2010  . Unspecified Peripheral Vascular Disease 01/18/2008  . PERSONAL HX COLONIC  POLYPS 12/10/2007  . AAA (abdominal aortic aneurysm)   . Varicose veins     Past Surgical History  Procedure Laterality Date  . Hernia surgury      63 years old  . Perineal abscess    . Sp endo repair infraren aaa    . S/p right and left popliteal aneurysm repair    . 7 abdominal hernia repairs  05/31/10  . Colonoscopy    . Polypectomy    . Abdominal aortic aneurysm repair      History   Social History  . Marital Status: Married    Spouse Name: N/A    Number of Children: 1  . Years of Education: N/A   Occupational History  . dsiabled due to stroke    Social History Main Topics  . Smoking status: Former Smoker    Types: Cigarettes    Quit date: 01/14/1992  . Smokeless tobacco: Never Used  . Alcohol Use: 1.2 oz/week    2 Glasses of wine per week     Comment: wine daily  . Drug Use: No  . Sexual Activity: Not on file   Other Topics Concern  . Not on file   Social History Narrative   2 cups of coffee in the morning and tea throughout the day.    Family History  Problem Relation Age of Onset  . Aortic aneurysm Mother     desceding aoritic aneurysm  . Cancer Brother  Lung cancer    Allergies as of 10/18/2013 - Review Complete 10/18/2013  Allergen Reaction Noted  . Latex Rash 08/03/2010    Current Outpatient Prescriptions on File Prior to Visit  Medication Sig Dispense Refill  . ascorbic Acid (VITAMIN C CR) 500 MG CPCR Take 500 mg by mouth daily.       Marland Kitchen aspirin EC 81 MG tablet Take 81 mg by mouth daily.      Marland Kitchen atorvastatin (LIPITOR) 40 MG tablet Take 1 tablet (40 mg total) by mouth daily.  90 tablet  3  . cetirizine (ZYRTEC) 10 MG tablet Take 1 tablet (10 mg total) by mouth daily.  90 tablet  3  . clopidogrel (PLAVIX) 75 MG tablet Take 1 tablet (75 mg total) by mouth daily.  90 tablet  3  . docusate sodium (COLACE) 100 MG capsule Take 100 mg by mouth 2 (two) times daily.       . Fiber CAPS Take 5.7 mg by mouth daily. Take (10) 0.57 capsules by mouth daily  for regularity.      . folic acid (FOLVITE) 1 MG tablet Take 1 tablet (1 mg total) by mouth daily.  90 tablet  3  . gemfibrozil (LOPID) 600 MG tablet Take 2 tablets (1,200 mg total) by mouth daily.  180 tablet  3  . metFORMIN (GLUCOPHAGE-XR) 500 MG 24 hr tablet Take 4 tablets (2,000 mg total) by mouth daily. 4 tabs by mouth in the AM  360 tablet  3  . Multiple Vitamin (MULTIVITAMIN) capsule Take 1 capsule by mouth daily.       Marland Kitchen olmesartan-hydrochlorothiazide (BENICAR HCT) 40-25 MG per tablet Take 1 tablet by mouth daily.  90 tablet  3  . Omega-3 Fatty Acids (FISH OIL PO) Take 2 capsules by mouth daily.       . pioglitazone (ACTOS) 45 MG tablet Take 1 tablet (45 mg total) by mouth daily.  90 tablet  3  . ranitidine (ZANTAC) 150 MG tablet Take 1 tablet (150 mg total) by mouth 2 (two) times daily.  60 tablet  11   No current facility-administered medications on file prior to visit.     REVIEW OF SYSTEMS: Cardiovascular: No chest pain, chest pressure, palpitations, orthopnea, or dyspnea on exertion. No claudication or rest pain,  No history of DVT or phlebitis. Pulmonary: No productive cough, asthma or wheezing. Neurologic: No weakness, paresthesias, aphasia, or amaurosis. No dizziness. Hematologic: No bleeding problems or clotting disorders. Musculoskeletal: No joint pain or joint swelling. Gastrointestinal: No blood in stool or hematemesis Genitourinary: No dysuria or hematuria. Psychiatric:: No history of major depression. Integumentary: No rashes or ulcers. Constitutional: No fever or chills.  PHYSICAL EXAMINATION:   Vital signs are BP 148/77  Pulse 51  Resp 18  Ht 5\' 10"  (1.778 m)  Wt 245 lb 4.8 oz (111.267 kg)  BMI 35.20 kg/m2 General: The patient appears their stated age. HEENT:  No gross abnormalities Pulmonary:  Non labored breathing Musculoskeletal: There are no major deformities. Neurologic: No focal weakness or paresthesias are detected, Skin: There are no ulcer or  rashes noted. Psychiatric: The patient has normal affect.    Diagnostic Studies I have reviewed his ultrasound studies.  ABI on the left is 1.1 on the left is 0.82.  There are triphasic waveforms bilaterally.  Duplex shows a velocity ratio of 1.7 at the distal anastomosis on the left.  On the right native disease and has velocity of 220.  Carotid ultrasound: 40-59% stenosis bilaterally  Assessment: Peripheral vascular disease Carotid disease Plan: Peripheral vascular disease: I discussed the ultrasound findings today with the patient.  He has triphasic waveforms on the left without any significant stenosis identified.  The ankle-brachial indices has decreased, he however he remains asymptomatic.  I will pay close attention to his velocity profile on the left leg at his next visit which will be in 6 months.  Carotid occlusive disease: The patient has had a slight progression of the stenosis in the right side.  He remained asymptomatic.  He will need a repeat carotid ultrasound in one year.  Eldridge Abrahams, M.D. [facility] Office: [phone] Pager:  [phone]

## 2013-11-11 DIAGNOSIS — Z0279 Encounter for issue of other medical certificate: Secondary | ICD-10-CM

## 2013-12-20 ENCOUNTER — Other Ambulatory Visit (HOSPITAL_COMMUNITY): Payer: No Typology Code available for payment source

## 2013-12-20 ENCOUNTER — Ambulatory Visit: Payer: No Typology Code available for payment source | Admitting: Surgery

## 2014-01-19 ENCOUNTER — Other Ambulatory Visit (INDEPENDENT_AMBULATORY_CARE_PROVIDER_SITE_OTHER): Payer: Managed Care, Other (non HMO)

## 2014-01-19 DIAGNOSIS — E119 Type 2 diabetes mellitus without complications: Secondary | ICD-10-CM

## 2014-01-19 LAB — BASIC METABOLIC PANEL
BUN: 20 mg/dL (ref 6–23)
CO2: 28 meq/L (ref 19–32)
CREATININE: 1 mg/dL (ref 0.4–1.5)
Calcium: 9.7 mg/dL (ref 8.4–10.5)
Chloride: 101 mEq/L (ref 96–112)
GFR: 78.72 mL/min (ref >60.00)
Glucose, Bld: 156 mg/dL — ABNORMAL HIGH (ref 70–99)
Potassium: 3.9 mEq/L (ref 3.5–5.1)
Sodium: 136 mEq/L (ref 135–145)

## 2014-01-19 LAB — HEPATIC FUNCTION PANEL
ALT: 21 U/L (ref 0–53)
AST: 24 U/L (ref 0–37)
Albumin: 4.1 g/dL (ref 3.5–5.2)
Alkaline Phosphatase: 30 U/L — ABNORMAL LOW (ref 39–117)
BILIRUBIN DIRECT: 0.1 mg/dL (ref 0.0–0.3)
TOTAL PROTEIN: 7.5 g/dL (ref 6.0–8.3)
Total Bilirubin: 0.7 mg/dL (ref 0.2–1.2)

## 2014-01-19 LAB — LIPID PANEL
Cholesterol: 134 mg/dL (ref 0–200)
HDL: 30.9 mg/dL — AB (ref >39.00)
LDL Cholesterol: 65 mg/dL (ref 0–99)
NonHDL: 103.1
Total CHOL/HDL Ratio: 4
Triglycerides: 193 mg/dL — ABNORMAL HIGH (ref 0.0–149.0)
VLDL: 38.6 mg/dL (ref 0.0–40.0)

## 2014-01-19 LAB — HEMOGLOBIN A1C: HEMOGLOBIN A1C: 5.9 % (ref 4.6–6.5)

## 2014-01-21 ENCOUNTER — Ambulatory Visit (INDEPENDENT_AMBULATORY_CARE_PROVIDER_SITE_OTHER): Payer: Managed Care, Other (non HMO) | Admitting: Internal Medicine

## 2014-01-21 ENCOUNTER — Encounter: Payer: Self-pay | Admitting: Internal Medicine

## 2014-01-21 VITALS — BP 110/72 | HR 64 | Temp 97.8°F | Ht 70.0 in | Wt 247.5 lb

## 2014-01-21 DIAGNOSIS — E119 Type 2 diabetes mellitus without complications: Secondary | ICD-10-CM

## 2014-01-21 DIAGNOSIS — IMO0001 Reserved for inherently not codable concepts without codable children: Secondary | ICD-10-CM

## 2014-01-21 DIAGNOSIS — I10 Essential (primary) hypertension: Secondary | ICD-10-CM

## 2014-01-21 DIAGNOSIS — E785 Hyperlipidemia, unspecified: Secondary | ICD-10-CM

## 2014-01-21 DIAGNOSIS — E1165 Type 2 diabetes mellitus with hyperglycemia: Secondary | ICD-10-CM

## 2014-01-21 DIAGNOSIS — Z0000 Encounter for general adult medical examination without abnormal findings: Secondary | ICD-10-CM

## 2014-01-21 MED ORDER — GEMFIBROZIL 600 MG PO TABS: 1200.0000 mg | ORAL_TABLET | Freq: Every day | ORAL | Status: DC

## 2014-01-21 MED ORDER — RANITIDINE HCL 150 MG PO TABS
150.0000 mg | ORAL_TABLET | Freq: Two times a day (BID) | ORAL | Status: DC
Start: 2014-01-21 — End: 2014-08-18

## 2014-01-21 MED ORDER — PIOGLITAZONE HCL 30 MG PO TABS: 30.0000 mg | ORAL_TABLET | Freq: Every day | ORAL | Status: DC

## 2014-01-21 MED ORDER — OLMESARTAN MEDOXOMIL-HCTZ 40-25 MG PO TABS: 1.0000 | ORAL_TABLET | Freq: Every day | ORAL | Status: DC

## 2014-01-21 MED ORDER — CETIRIZINE HCL 10 MG PO TABS: 10.0000 mg | ORAL_TABLET | Freq: Every day | ORAL | Status: DC

## 2014-01-21 MED ORDER — METFORMIN HCL ER 500 MG PO TB24: 2000.0000 mg | ORAL_TABLET | Freq: Every day | ORAL | Status: DC

## 2014-01-21 MED ORDER — CLOPIDOGREL BISULFATE 75 MG PO TABS: 75.0000 mg | ORAL_TABLET | Freq: Every day | ORAL | Status: DC

## 2014-01-21 MED ORDER — FOLIC ACID 1 MG PO TABS: 1.0000 mg | ORAL_TABLET | Freq: Every day | ORAL | Status: DC

## 2014-01-21 MED ORDER — ATORVASTATIN CALCIUM 40 MG PO TABS: 40.0000 mg | ORAL_TABLET | Freq: Every day | ORAL | Status: DC

## 2014-01-21 NOTE — Patient Instructions (Signed)
OK to decrease the actos to 30 mg per day  Please continue all other medications as before, and refills have been done if requested - robin should help with this to the new [facility] Delivery  Please have the pharmacy call with any other refills you may need.  Please continue your efforts at being more active, low cholesterol diet, and weight control.  You are otherwise up to date with prevention measures today.  Please keep your appointments with your specialists as you may have planned  Please return in 6 months, or sooner if needed, with Lab testing done 3-5 days before

## 2014-01-21 NOTE — Assessment & Plan Note (Signed)
stable overall by history and exam, recent data reviewed with pt, and pt to continue medical treatment as before except ok to decr the actos to 30 mg to avoid low sugars,  to f/u any worsening symptoms or concerns Lab Results  Component Value Date   HGBA1C 5.9 01/19/2014

## 2014-01-21 NOTE — Progress Notes (Signed)
Pre visit review using our clinic review tool, if applicable. No additional management support is needed unless otherwise documented below in the visit note. 

## 2014-01-21 NOTE — Addendum Note (Signed)
Addended by: Sharon Seller B on: 01/21/2014 09:05 AM   Modules accepted: Orders

## 2014-01-21 NOTE — Assessment & Plan Note (Signed)
stable overall by history and exam, recent data reviewed with pt, and pt to continue medical treatment as before,  to f/u any worsening symptoms or concerns BP Readings from Last 3 Encounters:  01/21/14 110/72  10/18/13 148/77  07/21/13 138/82

## 2014-01-21 NOTE — Assessment & Plan Note (Signed)
stable overall by history and exam, recent data reviewed with pt, and pt to continue medical treatment as before,  to f/u any worsening symptoms or concerns Lab Results  Component Value Date   LDLCALC 65 01/19/2014

## 2014-01-21 NOTE — Progress Notes (Signed)
Subjective:    Patient ID: David [name], male    DOB: [birth year], 63 y.o.   MRN: [id number]  HPI  Here to f/u; overall doing ok,  Pt denies chest pain, increased sob or doe, wheezing, orthopnea, PND, increased LE swelling, palpitations, dizziness or syncope.  Pt denies polydipsia, polyuria, or low sugar symptoms such as weakness or confusion improved with po intake.  Pt denies new neurological symptoms such as new headache, or facial or extremity weakness or numbness.   Pt states overall good compliance with meds, has been trying to follow lower cholesterol, diabetic diet, with wt overall stable,  but little exercise however. Declines flu shot.   Wt Readings from Last 3 Encounters:  01/21/14 247 lb 8 oz (112.265 kg)  10/18/13 245 lb 4.8 oz (111.267 kg)  07/21/13 241 lb (109.317 kg)  No current complaints.  Follows with vascular every 6 mo Past Medical History  Diagnosis Date  . Diverticulosis   . Hemorrhoids   . Hx of adenomatous colonic polyps   . Hyperlipidemia   . Hypertension   . CVA (cerebral vascular accident) 2003  . Anticardiolipin antibody positive   . Perineal abscess   . Diabetes mellitus     type II  . GERD (gastroesophageal reflux disease)   . Peripheral vascular disease     peripheral vascular disease/carotid artery <100%  . Anxiety   . Depression   . 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 CEREBROVASCULAR ACCIDENT, HX OF 07/19/2008  . ANXIETY 07/19/2008  . DEPRESSION 07/19/2008  . DIABETES MELLITUS, TYPE II 01/18/2008  . GERD 07/19/2008  . HYPERLIPIDEMIA 07/19/2008  . HYPERTENSION 07/31/2009  . Increased prostate specific antigen (PSA) velocity 09/13/2010  . Unspecified Peripheral Vascular Disease 01/18/2008  . PERSONAL HX COLONIC POLYPS 12/10/2007  . AAA (abdominal aortic aneurysm)   . Varicose veins    Past Surgical History  Procedure Laterality Date  . Hernia surgury      63 years old  . Perineal abscess    . Sp endo repair infraren aaa    . S/p  right and left popliteal aneurysm repair    . 7 abdominal hernia repairs  05/31/10  . Colonoscopy    . Polypectomy    . Abdominal aortic aneurysm repair      reports that he quit smoking about 22 years ago. His smoking use included Cigarettes. He smoked 0.00 packs per day. He has never used smokeless tobacco. He reports that he drinks about 1.2 ounces of alcohol per week. He reports that he does not use illicit drugs. family history includes Aortic aneurysm in his mother; Cancer in his brother. Allergies  Allergen Reactions  . Latex Rash   Current Outpatient Prescriptions on File Prior to Visit  Medication Sig Dispense Refill  . ascorbic Acid (VITAMIN C CR) 500 MG CPCR Take 500 mg by mouth daily.       Marland Kitchen aspirin EC 81 MG tablet Take 81 mg by mouth daily.      Marland Kitchen atorvastatin (LIPITOR) 40 MG tablet Take 1 tablet (40 mg total) by mouth daily.  90 tablet  3  . cetirizine (ZYRTEC) 10 MG tablet Take 1 tablet (10 mg total) by mouth daily.  90 tablet  3  . clopidogrel (PLAVIX) 75 MG tablet Take 1 tablet (75 mg total) by mouth daily.  90 tablet  3  . docusate sodium (COLACE) 100 MG capsule Take 100 mg by mouth 2 (two) times daily.       Marland Kitchen  Fiber CAPS Take 5.7 mg by mouth daily. Take (10) 0.57 capsules by mouth daily for regularity.      . folic acid (FOLVITE) 1 MG tablet Take 1 tablet (1 mg total) by mouth daily.  90 tablet  3  . gemfibrozil (LOPID) 600 MG tablet Take 2 tablets (1,200 mg total) by mouth daily.  180 tablet  3  . metFORMIN (GLUCOPHAGE-XR) 500 MG 24 hr tablet Take 4 tablets (2,000 mg total) by mouth daily. 4 tabs by mouth in the AM  360 tablet  3  . Multiple Vitamin (MULTIVITAMIN) capsule Take 1 capsule by mouth daily.       Marland Kitchen olmesartan-hydrochlorothiazide (BENICAR HCT) 40-25 MG per tablet Take 1 tablet by mouth daily.  90 tablet  3  . Omega-3 Fatty Acids (FISH OIL PO) Take 2 capsules by mouth daily.       . ranitidine (ZANTAC) 150 MG tablet Take 1 tablet (150 mg total) by mouth 2  (two) times daily.  60 tablet  11   No current facility-administered medications on file prior to visit.   Review of Systems  Constitutional: Negative for unusual diaphoresis or other sweats  HENT: Negative for ringing in ear Eyes: Negative for double vision or worsening visual disturbance.  Respiratory: Negative for choking and stridor.   Gastrointestinal: Negative for vomiting or other signifcant bowel change Genitourinary: Negative for hematuria or decreased urine volume.  Musculoskeletal: Negative for other MSK pain or swelling Skin: Negative for color change and worsening wound.  Neurological: Negative for tremors and numbness other than noted  Psychiatric/Behavioral: Negative for decreased concentration or agitation other than above       Objective:   Physical Exam BP 110/72  Pulse 64  Temp(Src) 97.8 F (36.6 C) (Oral)  Ht 5\' 10"  (1.778 m)  Wt 247 lb 8 oz (112.265 kg)  BMI 35.51 kg/m2  SpO2 96% VS noted,  Constitutional: Pt appears well-developed, well-nourished.  HENT: Head: NCAT.  Right Ear: External ear normal.  Left Ear: External ear normal.  Eyes: . Pupils are equal, round, and reactive to light. Conjunctivae and EOM are normal Neck: Normal range of motion. Neck supple.  Cardiovascular: Normal rate and regular rhythm.   Pulmonary/Chest: Effort normal and breath sounds normal.  Neurological: Pt is alert. Not confused , motor grossly intact Skin: Skin is warm. No rash Psychiatric: Pt behavior is normal. No agitation.      Assessment & Plan:

## 2014-04-14 ENCOUNTER — Encounter (HOSPITAL_COMMUNITY): Payer: Self-pay | Admitting: Surgery

## 2014-04-25 ENCOUNTER — Ambulatory Visit: Payer: No Typology Code available for payment source | Admitting: Family

## 2014-04-25 ENCOUNTER — Encounter (HOSPITAL_COMMUNITY): Payer: No Typology Code available for payment source

## 2014-04-25 ENCOUNTER — Other Ambulatory Visit (HOSPITAL_COMMUNITY): Payer: No Typology Code available for payment source

## 2014-05-02 ENCOUNTER — Encounter (HOSPITAL_COMMUNITY): Payer: No Typology Code available for payment source

## 2014-05-02 ENCOUNTER — Ambulatory Visit: Payer: No Typology Code available for payment source | Admitting: Family

## 2014-05-02 ENCOUNTER — Other Ambulatory Visit (HOSPITAL_COMMUNITY): Payer: No Typology Code available for payment source

## 2014-05-20 ENCOUNTER — Encounter: Payer: Self-pay | Admitting: Family

## 2014-05-23 ENCOUNTER — Ambulatory Visit (INDEPENDENT_AMBULATORY_CARE_PROVIDER_SITE_OTHER)
Admission: RE | Admit: 2014-05-23 | Discharge: 2014-05-23 | Disposition: A | Discharge status: Home / Self Care | Payer: Managed Care, Other (non HMO) | Source: Ambulatory Visit | Attending: Surgery | Admitting: Surgery

## 2014-05-23 ENCOUNTER — Ambulatory Visit (INDEPENDENT_AMBULATORY_CARE_PROVIDER_SITE_OTHER): Payer: Self-pay | Admitting: Surgery

## 2014-05-23 ENCOUNTER — Ambulatory Visit (HOSPITAL_COMMUNITY)
Admission: RE | Admit: 2014-05-23 | Discharge: 2014-05-23 | Disposition: A | Discharge status: Home / Self Care | Payer: Managed Care, Other (non HMO) | Source: Ambulatory Visit | Attending: Family | Admitting: Family

## 2014-05-23 ENCOUNTER — Encounter: Payer: Self-pay | Admitting: Family

## 2014-05-23 ENCOUNTER — Ambulatory Visit (INDEPENDENT_AMBULATORY_CARE_PROVIDER_SITE_OTHER): Payer: Managed Care, Other (non HMO) | Admitting: Family

## 2014-05-23 VITALS — BP 149/94 | HR 66 | Resp 16 | Ht 70.0 in | Wt 258.0 lb

## 2014-05-23 DIAGNOSIS — I714 Abdominal aortic aneurysm, without rupture, unspecified: Secondary | ICD-10-CM

## 2014-05-23 DIAGNOSIS — E785 Hyperlipidemia, unspecified: Secondary | ICD-10-CM | POA: Insufficient documentation

## 2014-05-23 DIAGNOSIS — I10 Essential (primary) hypertension: Secondary | ICD-10-CM | POA: Insufficient documentation

## 2014-05-23 DIAGNOSIS — Z87891 Personal history of nicotine dependence: Secondary | ICD-10-CM | POA: Diagnosis not present

## 2014-05-23 DIAGNOSIS — Z9889 Other specified postprocedural states: Secondary | ICD-10-CM

## 2014-05-23 DIAGNOSIS — I739 Peripheral vascular disease, unspecified: Secondary | ICD-10-CM

## 2014-05-23 DIAGNOSIS — Z959 Presence of cardiac and vascular implant and graft, unspecified: Secondary | ICD-10-CM

## 2014-05-23 DIAGNOSIS — Z95828 Presence of other vascular implants and grafts: Secondary | ICD-10-CM

## 2014-05-23 DIAGNOSIS — I6522 Occlusion and stenosis of left carotid artery: Secondary | ICD-10-CM

## 2014-05-23 DIAGNOSIS — I6521 Occlusion and stenosis of right carotid artery: Secondary | ICD-10-CM

## 2014-05-23 NOTE — Progress Notes (Signed)
[facility] [place] HISTORY AND PHYSICAL   MRN : [id number]  History of Present Illness:   David [name] is a 64 y.o. male patient of Dr. Trula Slade who returns today for followup. He has undergone open abdominal aortic aneurysm repair by Dr. Amedeo Plenty in 2012. 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 He had his left popliteal aneurysm repaired by above-knee to below-knee bypass in October of 2010. He's had a midline abdominal hernia repair by Dr. Johney Maine. He had elevated velocities in the proximal anastomosis of his bypass graft and underwent arteriogram which revealed a high-grade stenosis extending into his bypass graft. Balloon angioplasty was performed, however this resulted in a dissection and he therefore underwent stenting using a 6 mm stent. He was found to have in-stent stenosis on the left. On 01/12/2013 he underwent angiography with a successful balloon angioplasty of the stent using a 6 mm balloon.  He also has carotid artery stenosis: review of records: (June, 2015) 40-59% stenosis of right ICA and known occlusion of left ICA that we are following by Duplex, this is due in about 6 months. He admits to not walking much and eating too much. Pt denies any non healing wounds, denies LE rest pain. Pt reports a stroke in 2003 as manifested by left arm and left leg numbness that lasted less than 24 hours, he states prior to this he had several TIA's, he denies any strokes or TIA's since 2003.    Pt Diabetic: Yes, takes oral agents only for DM, states his last A1C was 5.? Pt smoker: former smoker, quit in 2003  Pt meds include: Statin :Yes ASA: Yes Other anticoagulants/antiplatelets: Plavix   Current Outpatient Prescriptions  Medication Sig Dispense Refill  . ascorbic Acid (VITAMIN C CR) 500 MG CPCR Take 500 mg by mouth daily.     Marland Kitchen aspirin EC 81 MG tablet Take 81 mg by mouth daily.    Marland Kitchen atorvastatin (LIPITOR) 40 MG tablet Take 1 tablet (40 mg  total) by mouth daily. 90 tablet 3  . cetirizine (ZYRTEC) 10 MG tablet Take 1 tablet (10 mg total) by mouth daily. 90 tablet 3  . clopidogrel (PLAVIX) 75 MG tablet Take 1 tablet (75 mg total) by mouth daily. 90 tablet 3  . docusate sodium (COLACE) 100 MG capsule Take 100 mg by mouth 2 (two) times daily.     . Fiber CAPS Take 5.7 mg by mouth daily. Take (10) 0.57 capsules by mouth daily for regularity.    . folic acid (FOLVITE) 1 MG tablet Take 1 tablet (1 mg total) by mouth daily. 90 tablet 3  . gemfibrozil (LOPID) 600 MG tablet Take 2 tablets (1,200 mg total) by mouth daily. 180 tablet 3  . metFORMIN (GLUCOPHAGE-XR) 500 MG 24 hr tablet Take 4 tablets (2,000 mg total) by mouth daily. 4 tabs by mouth in the AM 360 tablet 3  . Multiple Vitamin (MULTIVITAMIN) capsule Take 1 capsule by mouth daily.     Marland Kitchen olmesartan-hydrochlorothiazide (BENICAR HCT) 40-25 MG per tablet Take 1 tablet by mouth daily. 90 tablet 3  . Omega-3 Fatty Acids (FISH OIL PO) Take 2 capsules by mouth daily.     . pioglitazone (ACTOS) 30 MG tablet Take 1 tablet (30 mg total) by mouth daily. 90 tablet 3  . ranitidine (ZANTAC) 150 MG tablet Take 1 tablet (150 mg total) by mouth 2 (two) times daily. 180 tablet 3   No current facility-administered medications for this visit.  Past Medical History  Diagnosis Date  . Diverticulosis   . Hemorrhoids   . Hx of adenomatous colonic polyps   . Hyperlipidemia   . Hypertension   . CVA (cerebral vascular accident) 2003  . Anticardiolipin antibody positive   . Perineal abscess   . Diabetes mellitus     type II  . GERD (gastroesophageal reflux disease)   . Peripheral vascular disease     peripheral vascular disease/carotid artery <100%  . Anxiety   . Depression   . 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 CEREBROVASCULAR ACCIDENT, HX OF 07/19/2008  . ANXIETY 07/19/2008  . DEPRESSION 07/19/2008  . DIABETES MELLITUS, TYPE II 01/18/2008  . GERD 07/19/2008  . HYPERLIPIDEMIA  07/19/2008  . HYPERTENSION 07/31/2009  . Increased prostate specific antigen (PSA) velocity 09/13/2010  . Unspecified Peripheral Vascular Disease 01/18/2008  . PERSONAL HX COLONIC POLYPS 12/10/2007  . AAA (abdominal aortic aneurysm)   . Varicose veins     Social History History  Substance Use Topics  . Smoking status: Former Smoker    Types: Cigarettes    Quit date: 01/14/1992  . Smokeless tobacco: Never Used  . Alcohol Use: 1.2 oz/week    2 Glasses of wine per week     Comment: wine daily    Family History Family History  Problem Relation Age of Onset  . Aortic aneurysm Mother     desceding aoritic aneurysm  . Cancer Brother     Lung cancer    Surgical History Past Surgical History  Procedure Laterality Date  . Hernia surgury      64 years old  . Perineal abscess    . Sp endo repair infraren aaa    . S/p right and left popliteal aneurysm repair    . 7 abdominal hernia repairs  05/31/10  . Colonoscopy    . Polypectomy    . Abdominal aortic aneurysm repair    . Abdominal aortagram N/A 01/12/2013    Procedure: ABDOMINAL Maxcine Ham;  Surgeon: Serafina Mitchell, MD;  Location[facility] CATH LAB;  Service: Cardiovascular;  Laterality: N/A;    Allergies  Allergen Reactions  . Latex Rash    Current Outpatient Prescriptions  Medication Sig Dispense Refill  . ascorbic Acid (VITAMIN C CR) 500 MG CPCR Take 500 mg by mouth daily.     Marland Kitchen aspirin EC 81 MG tablet Take 81 mg by mouth daily.    Marland Kitchen atorvastatin (LIPITOR) 40 MG tablet Take 1 tablet (40 mg total) by mouth daily. 90 tablet 3  . cetirizine (ZYRTEC) 10 MG tablet Take 1 tablet (10 mg total) by mouth daily. 90 tablet 3  . clopidogrel (PLAVIX) 75 MG tablet Take 1 tablet (75 mg total) by mouth daily. 90 tablet 3  . docusate sodium (COLACE) 100 MG capsule Take 100 mg by mouth 2 (two) times daily.     . Fiber CAPS Take 5.7 mg by mouth daily. Take (10) 0.57 capsules by mouth daily for regularity.    . folic acid (FOLVITE) 1 MG tablet Take 1  tablet (1 mg total) by mouth daily. 90 tablet 3  . gemfibrozil (LOPID) 600 MG tablet Take 2 tablets (1,200 mg total) by mouth daily. 180 tablet 3  . metFORMIN (GLUCOPHAGE-XR) 500 MG 24 hr tablet Take 4 tablets (2,000 mg total) by mouth daily. 4 tabs by mouth in the AM 360 tablet 3  . Multiple Vitamin (MULTIVITAMIN) capsule Take 1 capsule by mouth daily.     Marland Kitchen  olmesartan-hydrochlorothiazide (BENICAR HCT) 40-25 MG per tablet Take 1 tablet by mouth daily. 90 tablet 3  . Omega-3 Fatty Acids (FISH OIL PO) Take 2 capsules by mouth daily.     . pioglitazone (ACTOS) 30 MG tablet Take 1 tablet (30 mg total) by mouth daily. 90 tablet 3  . ranitidine (ZANTAC) 150 MG tablet Take 1 tablet (150 mg total) by mouth 2 (two) times daily. 180 tablet 3   No current facility-administered medications for this visit.     REVIEW OF SYSTEMS: See HPI for pertinent positives and negatives.  Physical Examination Filed Vitals:   05/23/14 1430  BP: 149/94  Pulse: 66  Resp: 16  Height: 5\' 10"  (1.778 m)  Weight: 258 lb (117.028 kg)  SpO2: 100%   Body mass index is 37.02 kg/(m^2).  General:  WDWN obese male in NAD Gait: Normal HENT: WNL Eyes: Pupils equal Pulmonary: normal non-labored breathing , without Rales, rhonchi,  wheezing Cardiac: RRR, no murmur detected  Abdomen: soft, NT, no masses palpated Skin: no rashes, ulcers noted;  no Gangrene , no cellulitis; no open wounds.   VASCULAR EXAM  Carotid Bruits Right Left   Negative Negative       Aorta is not palpable Radial pulses are 2+ palpable and =.                      VASCULAR EXAM: Extremities without ischemic changes  without Gangrene; without open wounds.                                                                                                          LE Pulses Right Left       FEMORAL  not palpable (obese, large panus)  not palpable (obese, large panus)        POPLITEAL  not palpable   not palpable       POSTERIOR TIBIAL  not  palpable   not palpable        DORSALIS PEDIS      ANTERIOR TIBIAL 1+ palpable  not palpable     Musculoskeletal: no muscle wasting or atrophy; no peripheral edema.  Neurologic: A&O X 3; Appropriate Affect ;  SENSATION: normal; MOTOR FUNCTION: 5/5 Symmetric, CN 2-12 intact Speech is fluent/loud    Non-Invasive Vascular Imaging (05/23/2014):  LOWER EXTREMITY ARTERIAL EVALUATION    INDICATION: Follow-up bilateral lower extremity popliteal artery aneurysm repair    PREVIOUS INTERVENTION(S): Abdominal aortic aneurysm repair with tube graft 12/13/2008. Right femoropopliteal artery stent placed 12/06/2008 Left AK to BK popliteal artery graft placed 02/14/2009 with stent placed in proximal segment of graft 08/07/2010. Angioplasty of left stent 01/12/2013.    DUPLEX EXAM: Duplex evaluation of the lower extremity arterial system to include the common femoral, superficial femoral, popliteal, and tibial arteries and bypass graft(s) and/or stent(s) if present.      FINDINGS:  RIGHT LOWER EXTREMITY:  Right femoropopliteal arterial stent is widely patent X without evidence of restenosis or hyperplasia; however, moderate diffuse disease is observed in the native superficial femoral artery inflow with ~50% focal stenosis just proximal to the stent.     LEFT LOWER EXTREMITY:  Patent graft and stent with significantly elevated velocities observed in the stent suggestive of >50% restenosis. Area is difficult to fully evaluate due to surgical scarring and location/ depth.        See the attached diagram for velocities.  IMPRESSION:  1. Evidence of ~50% stenosis of the right native superficial femoral artery proximal to the stent. Stent is widely patent. 2. Evidence of >50% stenosis in the left stent of the proximal segment of the popliteal artery bypass graft.     ANKLE/BRACHIAL INDEX - Right = 1.00                     Left =   0.70        (Please see complete  report)                   ASSESSMENT:  David [name] is a 64 y.o. male who is s/p abdominal aortic aneurysm repair with tube graft 12/13/2008, right femoropopliteal artery stent placed 12/06/2008, left AK to BK popliteal artery graft placed 02/14/2009 with stent placed in proximal segment of graft 08/07/2010, and angioplasty of left stent 01/12/2013. He has no claudication symptoms, no tissue loss. He has not been walking much in the last 3 months, denies barriers to walking. Today's bilateral LE arterial Duplex reveals: 1. Evidence of ~50% stenosis of the right native superficial femoral artery proximal to the stent. Stent is widely patent. 2. Evidence of >50% stenosis in the left stent of the proximal segment of the popliteal artery bypass graft, 429 cm/s as highest velocity.  Right ABI remains normal and is stable, left ABI decreased to 0.70 from 0.82 in seven months.    (June, 2015) 40-59% stenosis of right ICA and known occlusion of left ICA that we are following by Duplex.  Face to face time with patient was 25 minutes. Over 50% of this time was spent on counseling and coordination of care.   PLAN:   Based on today's exam and non-invasive vascular lab results, and after discussing with Dr. Trula Slade, the patient will follow up in a Tuesday in February with the following tests: aortogram with bilateral run off, possible left LE intervention by Dr. Trula Slade. Pt states he will call back re when and if he wants this done. Carotid Duplex in 6 months, follow up with NP. I discussed in depth with the patient the nature of atherosclerosis, and emphasized the importance of maximal medical management including strict control of blood pressure, blood glucose, and lipid levels, obtaining regular exercise, and cessation of smoking.  The patient is aware that without maximal medical management the underlying atherosclerotic disease process will progress, limiting the benefit of any  interventions.  The patient was given information about stroke prevention and what symptoms should prompt the patient to seek immediate medical care.  The patient was given information about PAD including signs, symptoms, treatment, what symptoms should prompt the patient to seek immediate medical care, and risk reduction measures to take. Thank you for allowing [place] to participate in this patient's care.  Clemon Chambers, RN, MSN, FNP-C [facility] Office: [phone]  Clinic MD: Trula Slade 05/23/2014 2:30 PM

## 2014-05-23 NOTE — Patient Instructions (Signed)
Peripheral Vascular Disease Peripheral Vascular Disease (PVD), also called Peripheral Arterial Disease (PAD), is a circulation problem caused by cholesterol (atherosclerotic plaque) deposits in the arteries. PVD commonly occurs in the lower extremities (legs) but it can occur in other areas of the body, such as your arms. The cholesterol buildup in the arteries reduces blood flow which can cause pain and other serious problems. The presence of PVD can place a person at risk for Coronary Artery Disease (CAD).  CAUSES  Causes of PVD can be many. It is usually associated with more than one risk factor such as:   High Cholesterol.  Smoking.  Diabetes.  Lack of exercise or inactivity.  High blood pressure (hypertension).  Obesity.  Family history. SYMPTOMS   When the lower extremities are affected, patients with PVD may experience:  Leg pain with exertion or physical activity. This is called INTERMITTENT CLAUDICATION. This may present as cramping or numbness with physical activity. The location of the pain is associated with the level of blockage. For example, blockage at the abdominal level (distal abdominal aorta) may result in buttock or hip pain. Lower leg arterial blockage may result in calf pain.  As PVD becomes more severe, pain can develop with less physical activity.  In people with severe PVD, leg pain may occur at rest.  Other PVD signs and symptoms:  Leg numbness or weakness.  Coldness in the affected leg or foot, especially when compared to the other leg.  A change in leg color.  Patients with significant PVD are more prone to ulcers or sores on toes, feet or legs. These may take longer to heal or may reoccur. The ulcers or sores can become infected.  If signs and symptoms of PVD are ignored, gangrene may occur. This can result in the loss of toes or loss of an entire limb.  Not all leg pain is related to PVD. Other medical conditions can cause leg pain such  as:  Blood clots (embolism) or Deep Vein Thrombosis.  Inflammation of the blood vessels (vasculitis).  Spinal stenosis. DIAGNOSIS  Diagnosis of PVD can involve several different types of tests. These can include:  Pulse Volume Recording Method (PVR). This test is simple, painless and does not involve the use of X-rays. PVR involves measuring and comparing the blood pressure in the arms and legs. An ABI (Ankle-Brachial Index) is calculated. The normal ratio of blood pressures is 1. As this number becomes smaller, it indicates more severe disease.  < 0.95 - indicates significant narrowing in one or more leg vessels.  <0.8 - there will usually be pain in the foot, leg or buttock with exercise.  <0.4 - will usually have pain in the legs at rest.  <0.25 - usually indicates limb threatening PVD.  Doppler detection of pulses in the legs. This test is painless and checks to see if you have a pulses in your legs/feet.  A dye or contrast material (a substance that highlights the blood vessels so they show up on x-ray) may be given to help your caregiver better see the arteries for the following tests. The dye is eliminated from your body by the kidney's. Your caregiver may order blood work to check your kidney function and other laboratory values before the following tests are performed:  Magnetic Resonance Angiography (MRA). An MRA is a picture study of the blood vessels and arteries. The MRA machine uses a large magnet to produce images of the blood vessels.  Computed Tomography Angiography (CTA). A CTA   is a specialized x-ray that looks at how the blood flows in your blood vessels. An IV may be inserted into your arm so contrast dye can be injected.  Angiogram. Is a procedure that uses x-rays to look at your blood vessels. This procedure is minimally invasive, meaning a small incision (cut) is made in your groin. A small tube (catheter) is then inserted into the artery of your groin. The catheter  is guided to the blood vessel or artery your caregiver wants to examine. Contrast dye is injected into the catheter. X-rays are then taken of the blood vessel or artery. After the images are obtained, the catheter is taken out. TREATMENT  Treatment of PVD involves many interventions which may include:  Lifestyle changes:  Quitting smoking.  Exercise.  Following a low fat, low cholesterol diet.  Control of diabetes.  Foot care is very important to the PVD patient. Good foot care can help prevent infection.  Medication:  Cholesterol-lowering medicine.  Blood pressure medicine.  Anti-platelet drugs.  Certain medicines may reduce symptoms of Intermittent Claudication.  Interventional/Surgical options:  Angioplasty. An Angioplasty is a procedure that inflates a balloon in the blocked artery. This opens the blocked artery to improve blood flow.  Stent Implant. A wire mesh tube (stent) is placed in the artery. The stent expands and stays in place, allowing the artery to remain open.  Peripheral Bypass Surgery. This is a surgical procedure that reroutes the blood around a blocked artery to help improve blood flow. This type of procedure may be performed if Angioplasty or stent implants are not an option. SEEK IMMEDIATE MEDICAL CARE IF:   You develop pain or numbness in your arms or legs.  Your arm or leg turns cold, becomes blue in color.  You develop redness, warmth, swelling and pain in your arms or legs. MAKE SURE YOU:   Understand these instructions.  Will watch your condition.  Will get help right away if you are not doing well or get worse. Document Released: 05/30/2004 Document Revised: 07/15/2011 Document Reviewed: 04/26/2008 Exit[employer] Patient Information 2015 [employer]. This information is not intended to replace advice given to you by your health care provider. Make sure you discuss any questions you have with your health care provider.   Stroke  Prevention Some medical conditions and behaviors are associated with an increased chance of having a stroke. You may prevent a stroke by making healthy choices and managing medical conditions. HOW CAN I REDUCE MY RISK OF HAVING A STROKE?   Stay physically active. Get at least 30 minutes of activity on most or all days.  Do not smoke. It may also be helpful to avoid exposure to secondhand smoke.  Limit alcohol use. Moderate alcohol use is considered to be:  No more than 2 drinks per day for men.  No more than 1 drink per day for nonpregnant women.  Eat healthy foods. This involves:  Eating 5 or more servings of fruits and vegetables a day.  Making dietary changes that address high blood pressure (hypertension), high cholesterol, diabetes, or obesity.  Manage your cholesterol levels.  Making food choices that are high in fiber and low in saturated fat, trans fat, and cholesterol may control cholesterol levels.  Take any prescribed medicines to control cholesterol as directed by your health care provider.  Manage your diabetes.  Controlling your carbohydrate and sugar intake is recommended to manage diabetes.  Take any prescribed medicines to control diabetes as directed by your health care provider.    Control your hypertension.  Making food choices that are low in salt (sodium), saturated fat, trans fat, and cholesterol is recommended to manage hypertension.  Take any prescribed medicines to control hypertension as directed by your health care provider.  Maintain a healthy weight.  Reducing calorie intake and making food choices that are low in sodium, saturated fat, trans fat, and cholesterol are recommended to manage weight.  Stop drug abuse.  Avoid taking birth control pills.  Talk to your health care provider about the risks of taking birth control pills if you are over 35 years old, smoke, get migraines, or have ever had a blood clot.  Get evaluated for sleep  disorders (sleep apnea).  Talk to your health care provider about getting a sleep evaluation if you snore a lot or have excessive sleepiness.  Take medicines only as directed by your health care provider.  For some people, aspirin or blood thinners (anticoagulants) are helpful in reducing the risk of forming abnormal blood clots that can lead to stroke. If you have the irregular heart rhythm of atrial fibrillation, you should be on a blood thinner unless there is a good reason you cannot take them.  Understand all your medicine instructions.  Make sure that other conditions (such as anemia or atherosclerosis) are addressed. SEEK IMMEDIATE MEDICAL CARE IF:   You have sudden weakness or numbness of the face, arm, or leg, especially on one side of the body.  Your face or eyelid droops to one side.  You have sudden confusion.  You have trouble speaking (aphasia) or understanding.  You have sudden trouble seeing in one or both eyes.  You have sudden trouble walking.  You have dizziness.  You have a loss of balance or coordination.  You have a sudden, severe headache with no known cause.  You have new chest pain or an irregular heartbeat. Any of these symptoms may represent a serious problem that is an emergency. Do not wait to see if the symptoms will go away. Get medical help at once. Call your local emergency services (911 in [place] Do not drive yourself to the hospital. Document Released: 05/30/2004 Document Revised: 09/06/2013 Document Reviewed: 10/23/2012 ExitCare Patient Information 2015 [employer]. This information is not intended to replace advice given to you by your health care provider. Make sure you discuss any questions you have with your health care provider.  

## 2014-05-24 NOTE — Progress Notes (Signed)
Patient seen by David [name].  I reviewed his u/s.  He does not want to proceed with angio to eval the stenosis, given financial concerns.  We agree to repeat the u/s in 6 months.  He understands the risks and will contact me if he develops symptoms

## 2014-07-22 ENCOUNTER — Telehealth: Payer: Self-pay

## 2014-07-22 ENCOUNTER — Encounter: Payer: Managed Care, Other (non HMO) | Admitting: Internal Medicine

## 2014-07-22 NOTE — Telephone Encounter (Signed)
No answer, no way to leave message about flu vaccine

## 2014-08-03 ENCOUNTER — Other Ambulatory Visit (INDEPENDENT_AMBULATORY_CARE_PROVIDER_SITE_OTHER): Payer: Managed Care, Other (non HMO)

## 2014-08-03 DIAGNOSIS — Z0000 Encounter for general adult medical examination without abnormal findings: Secondary | ICD-10-CM | POA: Diagnosis not present

## 2014-08-03 DIAGNOSIS — IMO0002 Reserved for concepts with insufficient information to code with codable children: Secondary | ICD-10-CM

## 2014-08-03 DIAGNOSIS — R7989 Other specified abnormal findings of blood chemistry: Secondary | ICD-10-CM

## 2014-08-03 DIAGNOSIS — E1165 Type 2 diabetes mellitus with hyperglycemia: Secondary | ICD-10-CM | POA: Diagnosis not present

## 2014-08-03 LAB — CBC WITH DIFFERENTIAL/PLATELET
Basophils Absolute: 0.1 10*3/uL (ref 0.0–0.1)
Basophils Relative: 1.1 % (ref 0.0–3.0)
EOS ABS: 0.5 10*3/uL (ref 0.0–0.7)
EOS PCT: 10.6 % — AB (ref 0.0–5.0)
HCT: 40.5 % (ref 39.0–52.0)
Hemoglobin: 14.1 g/dL (ref 13.0–17.0)
LYMPHS ABS: 1 10*3/uL (ref 0.7–4.0)
Lymphocytes Relative: 20.9 % (ref 12.0–46.0)
MCHC: 34.9 g/dL (ref 30.0–36.0)
MCV: 92.4 fl (ref 78.0–100.0)
Monocytes Absolute: 0.5 10*3/uL (ref 0.1–1.0)
Monocytes Relative: 10.5 % (ref 3.0–12.0)
NEUTROS ABS: 2.6 10*3/uL (ref 1.4–7.7)
Neutrophils Relative %: 56.9 % (ref 43.0–77.0)
Platelets: 220 10*3/uL (ref 150.0–400.0)
RBC: 4.38 Mil/uL (ref 4.22–5.81)
RDW: 12.9 % (ref 11.5–15.5)
WBC: 4.6 10*3/uL (ref 4.0–10.5)

## 2014-08-03 LAB — MICROALBUMIN / CREATININE URINE RATIO
CREATININE, U: 79.7 mg/dL
MICROALB/CREAT RATIO: 72.6 mg/g — AB (ref 0.0–30.0)
Microalb, Ur: 57.9 mg/dL — ABNORMAL HIGH (ref 0.0–1.9)

## 2014-08-03 LAB — URINALYSIS, ROUTINE W REFLEX MICROSCOPIC
BILIRUBIN URINE: NEGATIVE
KETONES UR: NEGATIVE
LEUKOCYTES UA: NEGATIVE
Nitrite: NEGATIVE
SPECIFIC GRAVITY, URINE: 1.02 (ref 1.000–1.030)
Total Protein, Urine: 100 — AB
URINE GLUCOSE: NEGATIVE
Urobilinogen, UA: 0.2 (ref 0.0–1.0)
pH: 6 (ref 5.0–8.0)

## 2014-08-03 LAB — BASIC METABOLIC PANEL
BUN: 22 mg/dL (ref 6–23)
CALCIUM: 10.3 mg/dL (ref 8.4–10.5)
CO2: 29 mEq/L (ref 19–32)
CREATININE: 1.08 mg/dL (ref 0.40–1.50)
Chloride: 100 mEq/L (ref 96–112)
GFR: 72.74 mL/min (ref >60.00)
Glucose, Bld: 151 mg/dL — ABNORMAL HIGH (ref 70–99)
Potassium: 4.7 mEq/L (ref 3.5–5.1)
SODIUM: 135 meq/L (ref 135–145)

## 2014-08-03 LAB — HEPATIC FUNCTION PANEL
ALK PHOS: 30 U/L — AB (ref 39–117)
ALT: 22 U/L (ref 0–53)
AST: 23 U/L (ref 0–37)
Albumin: 4.3 g/dL (ref 3.5–5.2)
Bilirubin, Direct: 0.1 mg/dL (ref 0.0–0.3)
TOTAL PROTEIN: 7.8 g/dL (ref 6.0–8.3)
Total Bilirubin: 0.6 mg/dL (ref 0.2–1.2)

## 2014-08-03 LAB — LIPID PANEL
CHOLESTEROL: 134 mg/dL (ref 0–200)
HDL: 34.3 mg/dL — ABNORMAL LOW (ref >39.00)
NonHDL: 99.7
Total CHOL/HDL Ratio: 4
Triglycerides: 227 mg/dL — ABNORMAL HIGH (ref 0.0–149.0)
VLDL: 45.4 mg/dL — AB (ref 0.0–40.0)

## 2014-08-03 LAB — PSA: PSA: 2.36 ng/mL (ref 0.10–4.00)

## 2014-08-03 LAB — HEMOGLOBIN A1C: HEMOGLOBIN A1C: 6.2 % (ref 4.6–6.5)

## 2014-08-03 LAB — TSH: TSH: 1.59 u[IU]/mL (ref 0.35–4.50)

## 2014-08-03 LAB — LDL CHOLESTEROL, DIRECT: Direct LDL: 57 mg/dL

## 2014-08-04 ENCOUNTER — Encounter: Payer: Managed Care, Other (non HMO) | Admitting: Internal Medicine

## 2014-08-05 ENCOUNTER — Telehealth: Payer: Self-pay | Admitting: Internal Medicine

## 2014-08-05 NOTE — Telephone Encounter (Signed)
Ok to reschedule

## 2014-08-05 NOTE — Telephone Encounter (Signed)
Patient was scheduled yesterday for a 6 mos CPE and he had it written down at home that it was for today. Is ok to reschedule. He did get his blood work done a couple of days ago.

## 2014-08-15 ENCOUNTER — Telehealth: Payer: Self-pay | Admitting: Internal Medicine

## 2014-08-15 DIAGNOSIS — K625 Hemorrhage of anus and rectum: Secondary | ICD-10-CM

## 2014-08-15 NOTE — Telephone Encounter (Signed)
Pt reports long hx of hemorrhoids with intermittent flares of rectal bleeding He is on Plavix Several days of painless bleeding, perhaps more volume than previously.  No presyncopal symptoms or pain. Tried canasa suppository BID x 1.5 days as treated with this in the past Bleeding is bright red, no clots, no melena ED tonight for unremitting rectal bleeding or dizziness, lightheadedness, etc Tomorrow, plan CBC and discussion with [name] regarding management.  Banding could be an option with knowledge that bleeding complications are slightly higher in patients on antiplatelet medications, but banding could potentially be definitive treatment for him

## 2014-08-16 ENCOUNTER — Other Ambulatory Visit (INDEPENDENT_AMBULATORY_CARE_PROVIDER_SITE_OTHER): Payer: Managed Care, Other (non HMO)

## 2014-08-16 DIAGNOSIS — K625 Hemorrhage of anus and rectum: Secondary | ICD-10-CM | POA: Diagnosis not present

## 2014-08-16 LAB — CBC WITH DIFFERENTIAL/PLATELET
BASOS ABS: 0.1 10*3/uL (ref 0.0–0.1)
BASOS PCT: 1.5 % (ref 0.0–3.0)
EOS ABS: 0.5 10*3/uL (ref 0.0–0.7)
Eosinophils Relative: 8.3 % — ABNORMAL HIGH (ref 0.0–5.0)
HCT: 39.5 % (ref 39.0–52.0)
HEMOGLOBIN: 13.6 g/dL (ref 13.0–17.0)
LYMPHS PCT: 20.8 % (ref 12.0–46.0)
Lymphs Abs: 1.2 10*3/uL (ref 0.7–4.0)
MCHC: 34.5 g/dL (ref 30.0–36.0)
MCV: 93.2 fl (ref 78.0–100.0)
MONOS PCT: 10.9 % (ref 3.0–12.0)
Monocytes Absolute: 0.6 10*3/uL (ref 0.1–1.0)
NEUTROS ABS: 3.3 10*3/uL (ref 1.4–7.7)
Neutrophils Relative %: 58.5 % (ref 43.0–77.0)
Platelets: 279 10*3/uL (ref 150.0–400.0)
RBC: 4.23 Mil/uL (ref 4.22–5.81)
RDW: 13.1 % (ref 11.5–15.5)
WBC: 5.6 10*3/uL (ref 4.0–10.5)

## 2014-08-16 NOTE — Telephone Encounter (Signed)
Dr. Trula Slade,  Dr. Hilarie Fredrickson would like to schedule [name] for a hemorrhoid banding procedure.  Hemorrhoid banding may require up to 3 treatments, typically scheduled at least 2 weeks apart.  David [name] is on Plavix.  Is it ok to hold Plavix for 5-7 days at a time up to 3 times over an 8 week period?

## 2014-08-16 NOTE — Telephone Encounter (Signed)
Agree he appears to be a good candidate for banding. Would you see him banding?

## 2014-08-16 NOTE — Addendum Note (Signed)
Addended by: Marlon Pel on: 08/16/2014 08:57 AM   Modules accepted: Orders

## 2014-08-16 NOTE — Telephone Encounter (Signed)
Patient notified he will come for a CBC this am.  He reports he has not seen any bleeding in several hours now.

## 2014-08-16 NOTE — Telephone Encounter (Signed)
Per Dr. Hilarie Fredrickson ok to schedule hemorrhoidal banding, but needs permission to hold Plavix.  Patient reports that Dr. Trula Slade prescribes this for him.

## 2014-08-18 ENCOUNTER — Ambulatory Visit (INDEPENDENT_AMBULATORY_CARE_PROVIDER_SITE_OTHER): Payer: Managed Care, Other (non HMO) | Admitting: Internal Medicine

## 2014-08-18 ENCOUNTER — Encounter: Payer: Self-pay | Admitting: Internal Medicine

## 2014-08-18 VITALS — BP 120/74 | HR 59 | Temp 97.5°F | Resp 18 | Ht 70.0 in | Wt 255.1 lb

## 2014-08-18 DIAGNOSIS — Z0000 Encounter for general adult medical examination without abnormal findings: Secondary | ICD-10-CM

## 2014-08-18 DIAGNOSIS — E1149 Type 2 diabetes mellitus with other diabetic neurological complication: Secondary | ICD-10-CM

## 2014-08-18 DIAGNOSIS — Z23 Encounter for immunization: Secondary | ICD-10-CM

## 2014-08-18 DIAGNOSIS — R972 Elevated prostate specific antigen [PSA]: Secondary | ICD-10-CM

## 2014-08-18 MED ORDER — CETIRIZINE HCL 10 MG PO TABS: 10.0000 mg | ORAL_TABLET | Freq: Every day | ORAL | Status: AC

## 2014-08-18 MED ORDER — FOLIC ACID 1 MG PO TABS: 1.0000 mg | ORAL_TABLET | Freq: Every day | ORAL | Status: DC

## 2014-08-18 MED ORDER — GEMFIBROZIL 600 MG PO TABS: 1200.0000 mg | ORAL_TABLET | Freq: Every day | ORAL | Status: DC

## 2014-08-18 MED ORDER — PIOGLITAZONE HCL 30 MG PO TABS: 30.0000 mg | ORAL_TABLET | Freq: Every day | ORAL | Status: DC

## 2014-08-18 MED ORDER — ATORVASTATIN CALCIUM 40 MG PO TABS
40.0000 mg | ORAL_TABLET | Freq: Every day | ORAL | Status: DC
Start: 2014-08-18 — End: 2015-03-01

## 2014-08-18 MED ORDER — METFORMIN HCL ER 500 MG PO TB24: 2000.0000 mg | ORAL_TABLET | Freq: Every day | ORAL | Status: DC

## 2014-08-18 MED ORDER — RANITIDINE HCL 150 MG PO TABS: 150.0000 mg | ORAL_TABLET | Freq: Two times a day (BID) | ORAL | Status: DC

## 2014-08-18 MED ORDER — CLOPIDOGREL BISULFATE 75 MG PO TABS: 75.0000 mg | ORAL_TABLET | Freq: Every day | ORAL | Status: DC

## 2014-08-18 MED ORDER — OLMESARTAN MEDOXOMIL-HCTZ 40-25 MG PO TABS
1.0000 | ORAL_TABLET | Freq: Every day | ORAL | Status: DC
Start: 2014-08-18 — End: 2015-03-01

## 2014-08-18 NOTE — Addendum Note (Signed)
Addended by: Valerie Salts on: 08/18/2014 01:20 PM   Modules accepted: Orders

## 2014-08-18 NOTE — Assessment & Plan Note (Signed)
stable overall by history and exam, recent data reviewed with pt, and pt to continue medical treatment as before,  to f/u any worsening symptoms or concerns Lab Results  Component Value Date   HGBA1C 6.2 08/03/2014

## 2014-08-18 NOTE — Progress Notes (Signed)
Subjective:    Patient ID: David [name], male    DOB: [birth year], 64 y.o.   MRN: [id number]  HPI  Here for wellness and f/u;  Overall doing ok;  Pt denies Chest pain, worsening SOB, DOE, wheezing, orthopnea, PND, worsening LE edema, palpitations, dizziness or syncope.  Pt denies neurological change such as new headache, facial or extremity weakness.  Pt denies polydipsia, polyuria, or low sugar symptoms. Pt states overall good compliance with treatment and medications, good tolerability, and has been trying to follow appropriate diet.  Pt denies worsening depressive symptoms, suicidal ideation or panic. No fever, night sweats, wt loss, loss of appetite, or other constitutional symptoms.  Pt states good ability with ADL's, has low fall risk, home safety reviewed and adequate, no other significant changes in hearing or vision, and only occasionally active with exercise.  Wt Readings from Last 3 Encounters:  08/18/14 255 lb 1.3 oz (115.704 kg)  05/23/14 258 lb (117.028 kg)  01/21/14 247 lb 8 oz (112.265 kg)   Past Medical History  Diagnosis Date  . Diverticulosis   . Hemorrhoids   . Hx of adenomatous colonic polyps   . Hyperlipidemia   . Hypertension   . CVA (cerebral vascular accident) 2003  . Anticardiolipin antibody positive   . Perineal abscess   . Diabetes mellitus     type II  . GERD (gastroesophageal reflux disease)   . Peripheral vascular disease     peripheral vascular disease/carotid artery <100%  . Anxiety   . Depression   . 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 CEREBROVASCULAR ACCIDENT, HX OF 07/19/2008  . ANXIETY 07/19/2008  . DEPRESSION 07/19/2008  . DIABETES MELLITUS, TYPE II 01/18/2008  . GERD 07/19/2008  . HYPERLIPIDEMIA 07/19/2008  . HYPERTENSION 07/31/2009  . Increased prostate specific antigen (PSA) velocity 09/13/2010  . Unspecified Peripheral Vascular Disease 01/18/2008  . PERSONAL HX COLONIC POLYPS 12/10/2007  . AAA (abdominal aortic aneurysm)   .  Varicose veins    Past Surgical History  Procedure Laterality Date  . Hernia surgury      64 years old  . Perineal abscess    . Sp endo repair infraren aaa    . S/p right and left popliteal aneurysm repair    . 7 abdominal hernia repairs  05/31/10  . Colonoscopy    . Polypectomy    . Abdominal aortic aneurysm repair    . Abdominal aortagram N/A 01/12/2013    Procedure: ABDOMINAL Maxcine Ham;  Surgeon: Serafina Mitchell, MD;  Location: [facility] CATH LAB;  Service: Cardiovascular;  Laterality: N/A;    reports that he quit smoking about 22 years ago. His smoking use included Cigarettes. He has never used smokeless tobacco. He reports that he drinks about 1.2 oz of alcohol per week. He reports that he does not use illicit drugs. family history includes Aortic aneurysm in his mother; Cancer in his brother. Allergies  Allergen Reactions  . Latex Rash   Current Outpatient Prescriptions on File Prior to Visit  Medication Sig Dispense Refill  . ascorbic Acid (VITAMIN C CR) 500 MG CPCR Take 500 mg by mouth daily.     Marland Kitchen aspirin EC 81 MG tablet Take 81 mg by mouth daily.    Marland Kitchen docusate sodium (COLACE) 100 MG capsule Take 100 mg by mouth 2 (two) times daily.     . Fiber CAPS Take 5.7 mg by mouth daily. Take (10) 0.57 capsules by mouth daily for regularity.    Marland Kitchen  Multiple Vitamin (MULTIVITAMIN) capsule Take 1 capsule by mouth daily.     . Omega-3 Fatty Acids (FISH OIL PO) Take 2 capsules by mouth daily.      No current facility-administered medications on file prior to visit.   Review of Systems Constitutional: Negative for increased diaphoresis, other activity, appetite or siginficant weight change other than noted HENT: Negative for worsening hearing loss, ear pain, facial swelling, mouth sores and neck stiffness.   Eyes: Negative for other worsening pain, redness or visual disturbance.  Respiratory: Negative for shortness of breath and wheezing  Cardiovascular: Negative for chest pain and palpitations.    Gastrointestinal: Negative for diarrhea, blood in stool, abdominal distention or other pain Genitourinary: Negative for hematuria, flank pain or change in urine volume.  Musculoskeletal: Negative for myalgias or other joint complaints.  Skin: Negative for color change and wound or drainage.  Neurological: Negative for syncope and numbness. other than noted Hematological: Negative for adenopathy. or other swelling Psychiatric/Behavioral: Negative for hallucinations, SI, self-injury, decreased concentration or other worsening agitation.      Objective:   Physical Exam BP 120/74 mmHg  Pulse 59  Temp(Src) 97.5 F (36.4 C) (Oral)  Resp 18  Ht 5\' 10"  (1.778 m)  Wt 255 lb 1.3 oz (115.704 kg)  BMI 36.60 kg/m2  SpO2 99% VS noted,  Constitutional: Pt is oriented to person, place, and time. Appears well-developed and well-nourished, in no significant distress Head: Normocephalic and atraumatic.  Right Ear: External ear normal.  Left Ear: External ear normal.  Nose: Nose normal.  Mouth/Throat: Oropharynx is clear and moist.  Eyes: Conjunctivae and EOM are normal. Pupils are equal, round, and reactive to light.  Neck: Normal range of motion. Neck supple. No JVD present. No tracheal deviation present or significant neck LA or mass Cardiovascular: Normal rate, regular rhythm, normal heart sounds and intact distal pulses.   Pulmonary/Chest: Effort normal and breath sounds without rales or wheezing  Abdominal: Soft. Bowel sounds are normal. NT. No HSM  Musculoskeletal: Normal range of motion. Exhibits no edema.  Lymphadenopathy:  Has no cervical adenopathy.  Neurological: Pt is alert and oriented to person, place, and time. Pt has normal reflexes. No cranial nerve deficit. Motor grossly intact Skin: Skin is warm and dry. No rash noted.  Psychiatric:  Has normal mood and affect. Behavior is normal.     Assessment & Plan:

## 2014-08-18 NOTE — Patient Instructions (Signed)
Please continue all other medications as before, and refills have been done if requested.  Please have the pharmacy call with any other refills you may need.  Please continue your efforts at being more active, low cholesterol diet, and weight control.  You are otherwise up to date with prevention measures today.  Please keep your appointments with your specialists as you may have planned  Please return in 6 months, or sooner if needed, with Lab testing done 3-5 days before  

## 2014-08-18 NOTE — Assessment & Plan Note (Signed)

## 2014-08-18 NOTE — Assessment & Plan Note (Signed)
For f/u psa at 6 mo, borderline today increased

## 2014-08-22 ENCOUNTER — Telehealth: Payer: Self-pay | Admitting: Gastroenterology

## 2014-08-22 MED ORDER — MESALAMINE 1000 MG RE SUPP: 1000.0000 mg | Freq: Every day | RECTAL | Status: DC

## 2014-08-22 NOTE — Telephone Encounter (Signed)
yes

## 2014-08-22 NOTE — Telephone Encounter (Signed)
Patient notified that I am waiting for Dr. Trula Slade to answer about the Plavix.  He is notified I will contact him asap when Dr. Trula Slade reviews.  He is aware that I sent RX to [facility].

## 2014-08-22 NOTE — Telephone Encounter (Signed)
[name],  I am still waiting to hear from Dr. Trula Slade about his plavix before we set up a banding with Dr. [name] Herrlich per last phone note.  Is it ok to refill canasa until banding appt?

## 2014-08-22 NOTE — Telephone Encounter (Signed)
Discussed with Dr. [facility]'s plan to schedule Hemorrhoid banding, and of need for approval to hold Plavix prior to procedure.  Approved to hold the Plavix 5-7 days prior to the procedure, per [facility] protocol, and to resume as soon after procedure, as is felt to be safe.  Notified Sheri, RN @ [facility], per phone of approval.

## 2014-08-22 NOTE — Telephone Encounter (Signed)
Patient notified of recommendations He is scheduled for 10/14/14 and aware to hold the plavix on 10/14/14 and he will get instructions on when to resume

## 2014-08-22 NOTE — Telephone Encounter (Signed)
Dr. Hilarie Fredrickson I am going to schedule him for banding. Do you want to hold Plavix before at all or all after?

## 2014-08-22 NOTE — Telephone Encounter (Signed)
Hold on day of banding and will discuss with him at time of banding appointment Please place in banding clinic slot

## 2014-09-20 ENCOUNTER — Encounter: Payer: Self-pay | Admitting: *Deleted

## 2014-10-14 ENCOUNTER — Encounter: Payer: Self-pay | Admitting: Internal Medicine

## 2014-10-14 ENCOUNTER — Ambulatory Visit (INDEPENDENT_AMBULATORY_CARE_PROVIDER_SITE_OTHER): Payer: Managed Care, Other (non HMO) | Admitting: Internal Medicine

## 2014-10-14 VITALS — BP 146/80 | HR 72 | Ht 70.0 in | Wt 257.0 lb

## 2014-10-14 DIAGNOSIS — Z860100 Personal history of colon polyps, unspecified: Secondary | ICD-10-CM

## 2014-10-14 DIAGNOSIS — R6889 Other general symptoms and signs: Secondary | ICD-10-CM | POA: Diagnosis not present

## 2014-10-14 DIAGNOSIS — K625 Hemorrhage of anus and rectum: Secondary | ICD-10-CM

## 2014-10-14 DIAGNOSIS — K648 Other hemorrhoids: Secondary | ICD-10-CM | POA: Diagnosis not present

## 2014-10-14 MED ORDER — NA SULFATE-K SULFATE-MG SULF 17.5-3.13-1.6 GM/177ML PO SOLN: ORAL | Status: DC

## 2014-10-14 NOTE — Patient Instructions (Signed)
  You have been scheduled for a colonoscopy. Please follow written instructions given to you at your visit today.  Please use the prep kit you have been given today. If you use inhalers (even only as needed), please bring them with you on the day of your procedure.   Continue to hold your PLAVIX.   I appreciate the opportunity to care for you.

## 2014-10-14 NOTE — Progress Notes (Signed)
   Subjective:    Patient ID: David [name], male    DOB: Sep 06, 1950, 64 y.o.   MRN: [id number]  HPI David [name] is a 64 year old male with a past medical history of adenomatous colon polyps, diverticulosis, internal hemorrhoids, remote CVA, AAA status post repair with peripheral vascular disease on Plavix who presents to consider hemorrhoidal banding. He has intermittent rectal bleeding which occurs on and off, usually after defecation. He denies diarrhea or constipation. Denies perianal pain. In April he had an episode lasting 4-5 days of significant rectal bleeding dripping into the toilet and on the floor. He's used Canasa periodically to help improve the bleeding. Last colonoscopy 2012 which revealed diverticulosis and internal hemorrhoids treated at the time with injection by David [name]. He did not take his Plavix today. He denies abdominal pain. Denies significant upper GI complaint or hepatobiliary complaint.  Review of Systems As per history of present illness, otherwise negative  Current Medications, Allergies, Past Medical History, Past Surgical History, Family History and Social History were reviewed in [employer] record.     Objective:   Physical Exam BP 146/80 mmHg  Pulse 72  Ht 5\' 10"  (1.778 m)  Wt 257 lb (116.574 kg)  BMI 36.88 kg/m2 Constitutional: Well-developed and well-nourished. No distress. HEENT: Normocephalic and atraumatic. Marland Kitchen Conjunctivae are normal.  No scleral icterus. Cardiovascular: Normal rate, regular rhythm and intact distal pulses.  Pulmonary/chest: Effort normal and breath sounds normal. No wheezing, rales or rhonchi. Abdominal: Soft, obese, nontender, nondistended. Bowel sounds active throughout.  Neurological: Alert and oriented to person place and time. Psychiatric: Normal mood and affect. Behavior is normal.  Rectal: external skin tags, prolapse tissue with polypoid tip, cannot exclude prolapsing distal rectal polyp versus  Grade 4 internal hemorrhoid. This had friable surface Anoscopy was performed with the patient in the left lateral decubitus position while a chaperone was present and revealed internal hemorrhoids with prolapsing lesion as described above. 0.125% NTG ointment was applied to the anal canal with initial rectal examination     Assessment & [name]:  64 year old male with a past medical history of adenomatous colon polyps, diverticulosis, internal hemorrhoids, remote CVA, AAA status post repair with peripheral vascular disease on Plavix who presents to consider hemorrhoidal banding.  1. Intermittent rectal bleeding/internal hemorrhoids/distal rectal lesion prolapsing/history of colonic polyps -- we had a long discussion regarding hemorrhoidal banding in the setting Plavix. [name] was to hold Plavix for 7 days starting today and began hemorrhoidal banding treatment today. However, after rectal exam and anoscopy I feel it is most prudent to exclude a distal rectal polyp which is prolapsing through the anal canal. This did not have the typical appearance of a prolapsing internal hemorrhoid. If it is found to be hemorrhoid, it is grade 4. I recommended direct visualization with colonoscopy given his history of polyps. We discussed this today including the risks, benefits and alternatives and he is agreeable to proceed. I have also discussed this by phone with David [name] who also agrees with colonoscopy. This is scheduled for next Thursday. We have previous permission to hold Plavix from David [name] and he will hold 5 additional days until colonoscopy next Thursday at 8:30 AM. If lesion is polyp, it will likely be resected, if prolapsing hemorrhoid will need to make the decision between banding versus surgical resection.   >25 min spent with the patient today

## 2014-10-20 ENCOUNTER — Ambulatory Visit (AMBULATORY_SURGERY_CENTER): Payer: Managed Care, Other (non HMO) | Admitting: Gastroenterology

## 2014-10-20 ENCOUNTER — Encounter: Payer: Self-pay | Admitting: Gastroenterology

## 2014-10-20 VITALS — BP 132/64 | HR 67 | Temp 97.3°F | Resp 16 | Ht 70.0 in | Wt 257.0 lb

## 2014-10-20 DIAGNOSIS — D125 Benign neoplasm of sigmoid colon: Secondary | ICD-10-CM

## 2014-10-20 DIAGNOSIS — K635 Polyp of colon: Secondary | ICD-10-CM

## 2014-10-20 DIAGNOSIS — Z86010 Personal history of colonic polyps: Secondary | ICD-10-CM

## 2014-10-20 DIAGNOSIS — D123 Benign neoplasm of transverse colon: Secondary | ICD-10-CM

## 2014-10-20 DIAGNOSIS — K625 Hemorrhage of anus and rectum: Secondary | ICD-10-CM

## 2014-10-20 LAB — GLUCOSE, CAPILLARY
GLUCOSE-CAPILLARY: 137 mg/dL — AB (ref 65–99)
Glucose-Capillary: 133 mg/dL — ABNORMAL HIGH (ref 65–99)

## 2014-10-20 MED ORDER — SODIUM CHLORIDE 0.9 % IV SOLN: 500.0000 mL | INTRAVENOUS | Status: DC

## 2014-10-20 NOTE — Patient Instructions (Signed)
YOU HAD AN ENDOSCOPIC PROCEDURE TODAY AT [facility]:   Refer to the procedure report that was given to you for any specific questions about what was found during the examination.  If the procedure report does not answer your questions, please call your gastroenterologist to clarify.  If you requested that your care partner not be given the details of your procedure findings, then the procedure report has been included in a sealed envelope for you to review at your convenience later.  YOU SHOULD EXPECT: Some feelings of bloating in the abdomen. Passage of more gas than usual.  Walking can help get rid of the air that was put into your GI tract during the procedure and reduce the bloating. If you had a lower endoscopy (such as a colonoscopy or flexible sigmoidoscopy) you may notice spotting of blood in your stool or on the toilet paper. If you underwent a bowel prep for your procedure, you may not have a normal bowel movement for a few days.  Please Note:  You might notice some irritation and congestion in your nose or some drainage.  This is from the oxygen used during your procedure.  There is no need for concern and it should clear up in a day or so.  SYMPTOMS TO REPORT IMMEDIATELY:   Following lower endoscopy (colonoscopy or flexible sigmoidoscopy):  Excessive amounts of blood in the stool  Significant tenderness or worsening of abdominal pains  Swelling of the abdomen that is new, acute  Fever of 100F or higher    For urgent or emergent issues, a gastroenterologist can be reached at any hour by calling [phone].   DIET: Your first meal following the procedure should be a small meal and then it is ok to progress to your normal diet. Heavy or fried foods are harder to digest and may make you feel nauseous or bloated.  Likewise, meals heavy in dairy and vegetables can increase bloating.  Drink plenty of fluids but you should avoid alcoholic beverages for 24  hours.  ACTIVITY:  You should plan to take it easy for the rest of today and you should NOT DRIVE or use heavy machinery until tomorrow (because of the sedation medicines used during the test).    FOLLOW UP: Our staff will call the number listed on your records the next business day following your procedure to check on you and address any questions or concerns that you may have regarding the information given to you following your procedure. If we do not reach you, we will leave a message.  However, if you are feeling well and you are not experiencing any problems, there is no need to return our call.  We will assume that you have returned to your regular daily activities without incident.  If any biopsies were taken you will be contacted by phone or by letter within the next 1-3 weeks.  Please call us at [phone] if you have not heard about the biopsies in 3 weeks.    SIGNATURES/CONFIDENTIALITY: You and/or your care partner have signed paperwork which will be entered into your electronic medical record.  These signatures attest to the fact that that the information above on your After Visit Summary has been reviewed and is understood.  Full responsibility of the confidentiality of this discharge information lies with you and/or your care-partner.   Information on polyps,diverticuosis,hemorrhoids,&high fiber diet given to you today  HOLD ASPIRIN AND ANTI INFLAMMATORY MEDICATIONS FOR 2 WEEKS  Await pathology  results  RESUME PLAVIX TOMORROW  SURGICAL REFERRAL WILL BE MADE FOR HEMORRHOIDS

## 2014-10-20 NOTE — Progress Notes (Signed)
Called to room to assist during endoscopic procedure.  Patient ID and intended procedure confirmed with present staff. Received instructions for my participation in the procedure from the performing physician.  

## 2014-10-20 NOTE — Progress Notes (Signed)
  [facility] Post-op Note  Patient: David [name]  Procedure(s) Performed: colonoscopy  Patient Location: [facility] - Recovery Area  Anesthesia Type: Deep Sedation/Propofol  Level of Consciousness: awake, oriented and patient cooperative  Airway and Oxygen Therapy: Patient Spontanous Breathing  Post-op Pain: none  Post-op Assessment:  Post-op Vital signs reviewed, Patient's Cardiovascular Status Stable, Respiratory Function Stable, Patent Airway, No signs of Nausea or vomiting and Pain level controlled  Post-op Vital Signs: Reviewed and stable  Complications: No apparent anesthesia complications  Iris Hairston E 8:55 AM

## 2014-10-20 NOTE — Op Note (Signed)
[facility]  [address] [place], [address]   COLONOSCOPY PROCEDURE REPORT  PATIENT: David, [name]  MR#: [id number] BIRTHDATE: 1950/05/18 , 67  yrs. old GENDER: male ENDOSCOPIST: Ladene Artist, MD, [facility] PROCEDURE DATE:  10/20/2014 PROCEDURE:   Colonoscopy, diagnostic and Colonoscopy with snare polypectomy First Screening Colonoscopy - Avg.  risk and is 50 yrs.  old or older - No.  Prior Negative Screening - Now for repeat screening. N/A  History of Adenoma - Now for follow-up colonoscopy & has been > or = to 3 yrs.  Yes hx of adenoma.  Has been 3 or more years since last colonoscopy.  Polyps removed today? Yes ASA CLASS:   Class III INDICATIONS:Evaluation of unexplained GI bleeding, Surveillance due to prior colonic neoplasia, and PH Colon Adenoma. MEDICATIONS: Monitored anesthesia care and Propofol 300 mg IV DESCRIPTION OF PROCEDURE:   After the risks benefits and alternatives of the procedure were thoroughly explained, informed consent was obtained.  The digital rectal exam revealed external hemorrhoids.   The LB [id number]  endoscope was introduced through the anus and advanced to the cecum, which was identified by both the appendix and ileocecal valve. No adverse events experienced.   The quality of the prep was good.  (Suprep was used) The instrument was then slowly withdrawn as the colon was fully examined. Estimated blood loss is zero unless otherwise noted in this procedure report.  COLON FINDINGS: Two sessile polyps measuring 5-7 mm in size were found in the sigmoid colon and transverse colon.  Polypectomies were performed with a cold snare.  The resection was complete, the polyp tissue was completely retrieved and sent to histology. There was moderate diverticulosis noted in the descending colon and sigmoid colon.   There was mild diverticulosis noted in the transverse colon.   The examination was otherwise normal. Retroflexed views  revealed internal Grade II hemorrhoids. The time to cecum = 3.4 Withdrawal time = 12.8   The scope was withdrawn and the procedure completed. COMPLICATIONS: There were no immediate complications. ENDOSCOPIC IMPRESSION: 1.   Two sessile polyps in the sigmoid colon and transverse colon; polypectomies performed with a cold snare 2.   Moderate diverticulosis in the descending colon and sigmoid colon 3.   Mild diverticulosis in the transverse colon 4.   Grade Il internal hemorrhoids and moderate external hemorrhoids   RECOMMENDATIONS: 1.  Hold Aspirin and all other NSAIDS for 2 weeks. 2.  Await pathology results 3.  High fiber diet with liberal fluid intake. 4.  Repeat Colonoscopy in 5 years. 5.  Resume Plavix tomorrow 6.  Surgical referral - Dr. Marcello Moores  eSigned:  Ladene Artist, MD, [facility] 10/20/2014 8:55 AM  [C

## 2014-10-21 ENCOUNTER — Telehealth: Payer: Self-pay

## 2014-10-21 ENCOUNTER — Other Ambulatory Visit: Payer: Self-pay | Admitting: Gastroenterology

## 2014-10-21 DIAGNOSIS — K648 Other hemorrhoids: Secondary | ICD-10-CM

## 2014-10-21 NOTE — Telephone Encounter (Signed)
Patient request a refill of canasa suppository.

## 2014-10-21 NOTE — Telephone Encounter (Signed)
Ok to refills.

## 2014-10-21 NOTE — Telephone Encounter (Signed)
Patient informed of Surgical Consult with [phone] Dr Marcello Moores 11/16/2014 at 1 pm.

## 2014-10-21 NOTE — Progress Notes (Signed)
Appointment schedule at [facility] 11/16/2014 at 1pm.

## 2014-10-24 ENCOUNTER — Ambulatory Visit: Payer: No Typology Code available for payment source | Admitting: Family

## 2014-10-24 ENCOUNTER — Other Ambulatory Visit (HOSPITAL_COMMUNITY): Payer: No Typology Code available for payment source

## 2014-10-24 ENCOUNTER — Telehealth: Payer: Self-pay

## 2014-10-24 MED ORDER — MESALAMINE 1000 MG RE SUPP: 1000.0000 mg | Freq: Every day | RECTAL | Status: DC

## 2014-10-24 NOTE — Telephone Encounter (Signed)
Refill sent to pharmacy.Left a message for patient that medication has been refilled.

## 2014-10-24 NOTE — Telephone Encounter (Signed)
  Follow up Call-  Call back number 10/20/2014  Post procedure Call Back phone  # [phone]  Permission to leave phone message Yes     Patient questions:  Do you have a fever, pain , or abdominal swelling? No. Pain Score  0 *  Have you tolerated food without any problems? Yes.    Have you been able to return to your normal activities? Yes.    Do you have any questions about your discharge instructions: Diet   No. Medications  No. Follow up visit  No.  Do you have questions or concerns about your Care? No.  Actions: * If pain score is 4 or above: No action needed, pain <4.

## 2014-10-27 ENCOUNTER — Encounter: Payer: Self-pay | Admitting: Gastroenterology

## 2014-10-31 ENCOUNTER — Other Ambulatory Visit: Payer: Self-pay

## 2014-11-10 ENCOUNTER — Encounter: Payer: Self-pay | Admitting: Internal Medicine

## 2014-11-10 LAB — HM DIABETES EYE EXAM

## 2014-11-17 ENCOUNTER — Encounter: Payer: Self-pay | Admitting: Surgery

## 2014-11-21 ENCOUNTER — Encounter (HOSPITAL_COMMUNITY): Payer: Self-pay | Admitting: Pharmacy Technician

## 2014-11-21 ENCOUNTER — Ambulatory Visit (INDEPENDENT_AMBULATORY_CARE_PROVIDER_SITE_OTHER)
Admission: RE | Admit: 2014-11-21 | Discharge: 2014-11-21 | Disposition: A | Discharge status: Home / Self Care | Payer: Managed Care, Other (non HMO) | Source: Ambulatory Visit | Attending: Family | Admitting: Family

## 2014-11-21 ENCOUNTER — Encounter: Payer: Self-pay | Admitting: Surgery

## 2014-11-21 ENCOUNTER — Ambulatory Visit (HOSPITAL_COMMUNITY)
Admission: RE | Admit: 2014-11-21 | Discharge: 2014-11-21 | Disposition: A | Discharge status: Home / Self Care | Payer: Managed Care, Other (non HMO) | Source: Ambulatory Visit | Attending: Family | Admitting: Family

## 2014-11-21 ENCOUNTER — Ambulatory Visit (INDEPENDENT_AMBULATORY_CARE_PROVIDER_SITE_OTHER): Payer: Managed Care, Other (non HMO) | Admitting: Surgery

## 2014-11-21 ENCOUNTER — Other Ambulatory Visit: Payer: Self-pay

## 2014-11-21 VITALS — BP 153/84 | HR 73 | Ht 70.0 in | Wt 259.0 lb

## 2014-11-21 DIAGNOSIS — I6523 Occlusion and stenosis of bilateral carotid arteries: Secondary | ICD-10-CM | POA: Diagnosis not present

## 2014-11-21 DIAGNOSIS — I70203 Unspecified atherosclerosis of native arteries of extremities, bilateral legs: Secondary | ICD-10-CM | POA: Diagnosis not present

## 2014-11-21 DIAGNOSIS — Z87891 Personal history of nicotine dependence: Secondary | ICD-10-CM | POA: Diagnosis not present

## 2014-11-21 DIAGNOSIS — Z48812 Encounter for surgical aftercare following surgery on the circulatory system: Secondary | ICD-10-CM | POA: Insufficient documentation

## 2014-11-21 DIAGNOSIS — Z9889 Other specified postprocedural states: Secondary | ICD-10-CM

## 2014-11-21 DIAGNOSIS — Z95820 Peripheral vascular angioplasty status with implants and grafts: Secondary | ICD-10-CM | POA: Diagnosis not present

## 2014-11-21 DIAGNOSIS — I10 Essential (primary) hypertension: Secondary | ICD-10-CM | POA: Insufficient documentation

## 2014-11-21 DIAGNOSIS — I739 Peripheral vascular disease, unspecified: Secondary | ICD-10-CM | POA: Diagnosis not present

## 2014-11-21 DIAGNOSIS — I6522 Occlusion and stenosis of left carotid artery: Secondary | ICD-10-CM

## 2014-11-21 DIAGNOSIS — Z95828 Presence of other vascular implants and grafts: Secondary | ICD-10-CM

## 2014-11-21 DIAGNOSIS — E785 Hyperlipidemia, unspecified: Secondary | ICD-10-CM | POA: Insufficient documentation

## 2014-11-21 NOTE — Progress Notes (Signed)
Patient name: David [name] MRN: [id number] DOB: 20-Nov-1950 Sex: male     Chief Complaint  Patient presents with  . Re-evaluation    6 month f/u - carotid, abi's, and Le areterial duplex    HISTORY OF PRESENT ILLNESS: The patient comes back today for followup. He has undergone open abdominal aortic aneurysm repair by Dr. Amedeo Plenty in 2012. 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 He had his left popliteal aneurysm repaired by above-knee to below-knee bypass in October of 2010 he's had a midline abdominal hernia repair by Dr. gross. He had elevated velocities in the proximal anastomosis of his bypass graft and underwent arteriogram which revealed a high-grade stenosis extending into his bypass graft balloon angioplasty was performed however this resulted in a dissection and he therefore underwent stenting using a 6 mm stent. He was found to have in-stent stenosis on the left. On 01/12/2013 he underwent angiography with a successful balloon angioplasty of the stent using a 6 mm balloon.   the patient is medically managed for diabetes.  His most recent hemoglobin A1c was 6.2.  He takes a statin for hypercholesterolemia.   Past Medical History  Diagnosis Date  . Diverticulosis   . Hemorrhoids   . Hx of adenomatous colonic polyps   . Hyperlipidemia   . Hypertension   . CVA (cerebral vascular accident) 2003  . Anticardiolipin antibody positive   . Perineal abscess   . Diabetes mellitus     type II  . GERD (gastroesophageal reflux disease)   . Peripheral vascular disease     peripheral vascular disease/carotid artery <100%  . Anxiety   . Depression   . 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 CEREBROVASCULAR ACCIDENT, HX OF 07/19/2008  . ANXIETY 07/19/2008  . DEPRESSION 07/19/2008  . DIABETES MELLITUS, TYPE II 01/18/2008  . GERD 07/19/2008  . HYPERLIPIDEMIA 07/19/2008  . HYPERTENSION 07/31/2009  . Increased prostate specific antigen (PSA) velocity 09/13/2010   . Unspecified Peripheral Vascular Disease 01/18/2008  . PERSONAL HX COLONIC POLYPS 12/10/2007  . AAA (abdominal aortic aneurysm)   . Varicose veins   . Internal hemorrhoids     Past Surgical History  Procedure Laterality Date  . Hernia surgury      64 years old  . Perineal abscess    . Sp endo repair infraren aaa    . S/p right and left popliteal aneurysm repair    . 7 abdominal hernia repairs  05/31/10  . Colonoscopy    . Polypectomy    . Abdominal aortic aneurysm repair    . Abdominal aortagram N/A 01/12/2013    Procedure: ABDOMINAL Maxcine Ham;  Surgeon: Serafina Mitchell, MD;  Location: [facility] LAB;  Service: Cardiovascular;  Laterality: N/A;    History   Social History  . Marital Status: Married    Spouse Name: N/A  . Number of Children: 1  . Years of Education: N/A   Occupational History  . dsiabled due to stroke    Social History Main Topics  . Smoking status: Former Smoker    Types: Cigarettes    Quit date: 01/14/1992  . Smokeless tobacco: Never Used  . Alcohol Use: 1.2 oz/week    2 Glasses of wine per week     Comment: wine daily  . Drug Use: No  . Sexual Activity: Not on file   Other Topics Concern  . Not on file   Social History Narrative   2 cups  of coffee in the morning and tea throughout the day.    Family History  Problem Relation Age of Onset  . Aortic aneurysm Mother     desceding aoritic aneurysm  . Cancer Brother     Lung cancer  . Colon cancer Neg Hx   . Esophageal cancer Neg Hx   . Stomach cancer Neg Hx   . Rectal cancer Neg Hx     Allergies as of 11/21/2014 - Review Complete 11/21/2014  Allergen Reaction Noted  . Latex Rash 08/03/2010    Current Outpatient Prescriptions on File Prior to Visit  Medication Sig Dispense Refill  . ascorbic Acid (VITAMIN C CR) 500 MG CPCR Take 500 mg by mouth daily.     Marland Kitchen aspirin EC 81 MG tablet Take 81 mg by mouth daily.    Marland Kitchen atorvastatin (LIPITOR) 40 MG tablet Take 1 tablet (40 mg total) by mouth  daily. 90 tablet 3  . cetirizine (ZYRTEC) 10 MG tablet Take 1 tablet (10 mg total) by mouth daily. 90 tablet 3  . clopidogrel (PLAVIX) 75 MG tablet Take 1 tablet (75 mg total) by mouth daily. 90 tablet 3  . docusate sodium (COLACE) 100 MG capsule Take 100 mg by mouth 2 (two) times daily.     . Fiber CAPS Take 5.7 mg by mouth daily. Take (10) 0.57 capsules by mouth daily for regularity.    . folic acid (FOLVITE) 1 MG tablet Take 1 tablet (1 mg total) by mouth daily. 90 tablet 3  . gemfibrozil (LOPID) 600 MG tablet Take 2 tablets (1,200 mg total) by mouth daily. 180 tablet 3  . mesalamine (CANASA) 1000 MG suppository Place 1 suppository (1,000 mg total) rectally at bedtime. 30 suppository 0  . metFORMIN (GLUCOPHAGE-XR) 500 MG 24 hr tablet Take 4 tablets (2,000 mg total) by mouth daily. 4 tabs by mouth in the AM 360 tablet 3  . metroNIDAZOLE (METROGEL) 0.75 % gel     . Multiple Vitamin (MULTIVITAMIN) capsule Take 1 capsule by mouth daily.     Marland Kitchen olmesartan-hydrochlorothiazide (BENICAR HCT) 40-25 MG per tablet Take 1 tablet by mouth daily. 90 tablet 3  . Omega-3 Fatty Acids (FISH OIL PO) Take 2 capsules by mouth daily.     . pioglitazone (ACTOS) 30 MG tablet Take 1 tablet (30 mg total) by mouth daily. 90 tablet 3  . ranitidine (ZANTAC) 150 MG tablet Take 1 tablet (150 mg total) by mouth 2 (two) times daily. 180 tablet 3   No current facility-administered medications on file prior to visit.     REVIEW OF SYSTEMS: Cardiovascular: No chest pain, chest pressure, palpitations, orthopnea, or dyspnea on exertion. No claudication or rest pain,  No history of DVT or phlebitis. Pulmonary: No productive cough, asthma or wheezing. Neurologic: No weakness, paresthesias, aphasia, or amaurosis. No dizziness. Hematologic: No bleeding problems or clotting disorders. Musculoskeletal: No joint pain or joint swelling. Gastrointestinal: No blood in stool or hematemesis Genitourinary: No dysuria or  hematuria. Psychiatric:: No history of major depression. Integumentary: No rashes or ulcers. Constitutional: No fever or chills.  PHYSICAL EXAMINATION:   Vital signs are  Filed Vitals:   11/21/14 1102 11/21/14 1104  BP: 148/88 153/84  Pulse: 68 73  Height: 5' 10" (1.778 m)   Weight: 259 lb (117.482 kg)   SpO2: 99%    Body mass index is 37.16 kg/(m^2). General: The patient appears their stated age. HEENT:  No gross abnormalities Pulmonary:  Non labored breathing Abdomen: Soft and non-tender  Musculoskeletal: There are no major deformities. Neurologic: No focal weakness or paresthesias are detected, Skin: There are no ulcer or rashes noted. Psychiatric: The patient has normal affect. Cardiovascular: There is a regular rate and rhythm without significant murmur appreciated.   Diagnostic Studies  vascular lab studies were ordered and reviewed today. On the right , no significant stenosis is identified.  On the left waveforms have changed from biphasic to monophasic in the superficial femoral and popliteal artery.  No definitive source is identified.  However, the velocity elevation within the stent is 4 38 cm/s.  ABI on the left is 0.78.  On the right is 1.0  Assessment:  abdominal aortic aneurysm  Bilateral popliteal aneurysm Plan:  I discussed the ultrasound findings today with the patient.  I feel that the waveform change in addition to the stenosis within the stent warrant further evaluation by angiography. I wanted to do this 6 months ago but the patient had some financial concerns and therefore we delayed it. Currently he has met his to Dr. Diona Foley, and therefore is willing to proceed.  This will be done on Wednesday, July 20.  I will plan on right femoral artery cannulation with abdominal aortogram and bilateral runoff with intervention on the left leg if I feel that it is appropriate.  Eldridge Abrahams, M.D. [facility] Office:  [phone] Pager:  [phone]

## 2014-11-23 ENCOUNTER — Other Ambulatory Visit: Payer: Self-pay | Admitting: *Deleted

## 2014-11-23 ENCOUNTER — Ambulatory Visit (HOSPITAL_COMMUNITY)
Admission: RE | Admit: 2014-11-23 | Discharge: 2014-11-23 | Disposition: A | Discharge status: Home / Self Care | Payer: Managed Care, Other (non HMO) | Source: Ambulatory Visit | Attending: Surgery | Admitting: Surgery

## 2014-11-23 ENCOUNTER — Other Ambulatory Visit: Payer: Self-pay | Admitting: Physician Assistant

## 2014-11-23 ENCOUNTER — Encounter (HOSPITAL_COMMUNITY)
Admission: RE | Disposition: A | Discharge status: Home / Self Care | Payer: Self-pay | Source: Ambulatory Visit | Attending: Surgery

## 2014-11-23 DIAGNOSIS — F329 Major depressive disorder, single episode, unspecified: Secondary | ICD-10-CM | POA: Diagnosis not present

## 2014-11-23 DIAGNOSIS — Z7982 Long term (current) use of aspirin: Secondary | ICD-10-CM | POA: Diagnosis not present

## 2014-11-23 DIAGNOSIS — I714 Abdominal aortic aneurysm, without rupture: Secondary | ICD-10-CM | POA: Insufficient documentation

## 2014-11-23 DIAGNOSIS — E780 Pure hypercholesterolemia: Secondary | ICD-10-CM | POA: Diagnosis not present

## 2014-11-23 DIAGNOSIS — Z87891 Personal history of nicotine dependence: Secondary | ICD-10-CM | POA: Diagnosis not present

## 2014-11-23 DIAGNOSIS — K648 Other hemorrhoids: Secondary | ICD-10-CM | POA: Diagnosis not present

## 2014-11-23 DIAGNOSIS — I70302 Unspecified atherosclerosis of unspecified type of bypass graft(s) of the extremities, left leg: Secondary | ICD-10-CM | POA: Diagnosis present

## 2014-11-23 DIAGNOSIS — Z86010 Personal history of colonic polyps: Secondary | ICD-10-CM | POA: Insufficient documentation

## 2014-11-23 DIAGNOSIS — I10 Essential (primary) hypertension: Secondary | ICD-10-CM | POA: Diagnosis not present

## 2014-11-23 DIAGNOSIS — Z8673 Personal history of transient ischemic attack (TIA), and cerebral infarction without residual deficits: Secondary | ICD-10-CM | POA: Diagnosis not present

## 2014-11-23 DIAGNOSIS — Z7902 Long term (current) use of antithrombotics/antiplatelets: Secondary | ICD-10-CM | POA: Diagnosis not present

## 2014-11-23 DIAGNOSIS — I70203 Unspecified atherosclerosis of native arteries of extremities, bilateral legs: Secondary | ICD-10-CM | POA: Insufficient documentation

## 2014-11-23 DIAGNOSIS — F419 Anxiety disorder, unspecified: Secondary | ICD-10-CM | POA: Diagnosis not present

## 2014-11-23 DIAGNOSIS — I481 Persistent atrial fibrillation: Secondary | ICD-10-CM

## 2014-11-23 DIAGNOSIS — I70402 Unspecified atherosclerosis of autologous vein bypass graft(s) of the extremities, left leg: Secondary | ICD-10-CM | POA: Diagnosis not present

## 2014-11-23 DIAGNOSIS — Z9862 Peripheral vascular angioplasty status: Secondary | ICD-10-CM

## 2014-11-23 DIAGNOSIS — K219 Gastro-esophageal reflux disease without esophagitis: Secondary | ICD-10-CM | POA: Diagnosis not present

## 2014-11-23 DIAGNOSIS — E1151 Type 2 diabetes mellitus with diabetic peripheral angiopathy without gangrene: Secondary | ICD-10-CM | POA: Diagnosis not present

## 2014-11-23 DIAGNOSIS — I739 Peripheral vascular disease, unspecified: Secondary | ICD-10-CM | POA: Diagnosis not present

## 2014-11-23 DIAGNOSIS — Z91040 Latex allergy status: Secondary | ICD-10-CM | POA: Insufficient documentation

## 2014-11-23 DIAGNOSIS — I4891 Unspecified atrial fibrillation: Secondary | ICD-10-CM

## 2014-11-23 HISTORY — PX: PERIPHERAL VASCULAR CATHETERIZATION: SHX172C

## 2014-11-23 LAB — GLUCOSE, CAPILLARY
Glucose-Capillary: 123 mg/dL — ABNORMAL HIGH (ref 65–99)
Glucose-Capillary: 123 mg/dL — ABNORMAL HIGH (ref 65–99)

## 2014-11-23 LAB — POCT I-STAT, CHEM 8
BUN: 25 mg/dL — ABNORMAL HIGH (ref 6–20)
Calcium, Ion: 1.31 mmol/L — ABNORMAL HIGH (ref 1.13–1.30)
Chloride: 102 mmol/L (ref 101–111)
Creatinine, Ser: 1.1 mg/dL (ref 0.61–1.24)
Glucose, Bld: 137 mg/dL — ABNORMAL HIGH (ref 65–99)
HCT: 44 % (ref 39.0–52.0)
Hemoglobin: 15 g/dL (ref 13.0–17.0)
POTASSIUM: 3.8 mmol/L (ref 3.5–5.1)
Sodium: 139 mmol/L (ref 135–145)
TCO2: 26 mmol/L (ref 0–100)

## 2014-11-23 LAB — POCT ACTIVATED CLOTTING TIME
ACTIVATED CLOTTING TIME: 202 s
ACTIVATED CLOTTING TIME: 165 s
Activated Clotting Time: 220 seconds

## 2014-11-23 SURGERY — ABDOMINAL AORTOGRAM

## 2014-11-23 MED ORDER — APIXABAN 5 MG PO TABS: 5.0000 mg | ORAL_TABLET | Freq: Two times a day (BID) | ORAL | Status: DC

## 2014-11-23 MED ORDER — MIDAZOLAM HCL 2 MG/2ML IJ SOLN
INTRAMUSCULAR | Status: AC
  Filled 2014-11-23: qty 2

## 2014-11-23 MED ORDER — HEPARIN (PORCINE) IN NACL 2-0.9 UNIT/ML-% IJ SOLN
INTRAMUSCULAR | Status: DC | PRN
  Administered 2014-11-23: 10:00:00

## 2014-11-23 MED ORDER — ALUM & MAG HYDROXIDE-SIMETH 200-200-20 MG/5ML PO SUSP: 15.0000 mL | ORAL | Status: DC | PRN

## 2014-11-23 MED ORDER — PHENOL 1.4 % MT LIQD: 1.0000 | OROMUCOSAL | Status: DC | PRN

## 2014-11-23 MED ORDER — HYDRALAZINE HCL 20 MG/ML IJ SOLN
5.0000 mg | INTRAMUSCULAR | Status: DC | PRN
  Administered 2014-11-23: 12:00:00 via INTRAVENOUS

## 2014-11-23 MED ORDER — HEPARIN SODIUM (PORCINE) 1000 UNIT/ML IJ SOLN
INTRAMUSCULAR | Status: DC | PRN
  Administered 2014-11-23: 8000 [IU] via INTRAVENOUS

## 2014-11-23 MED ORDER — GUAIFENESIN-DM 100-10 MG/5ML PO SYRP: 15.0000 mL | ORAL_SOLUTION | ORAL | Status: DC | PRN

## 2014-11-23 MED ORDER — LIDOCAINE HCL (PF) 1 % IJ SOLN
INTRAMUSCULAR | Status: DC | PRN
  Administered 2014-11-23: 20 mL

## 2014-11-23 MED ORDER — FENTANYL CITRATE (PF) 100 MCG/2ML IJ SOLN
INTRAMUSCULAR | Status: DC | PRN
  Administered 2014-11-23 (×3): 25 ug via INTRAVENOUS

## 2014-11-23 MED ORDER — ACETAMINOPHEN 325 MG RE SUPP: 325.0000 mg | RECTAL | Status: DC | PRN

## 2014-11-23 MED ORDER — LIDOCAINE HCL (PF) 1 % IJ SOLN
INTRAMUSCULAR | Status: AC
  Filled 2014-11-23: qty 30

## 2014-11-23 MED ORDER — FENTANYL CITRATE (PF) 100 MCG/2ML IJ SOLN
INTRAMUSCULAR | Status: AC
  Filled 2014-11-23: qty 2

## 2014-11-23 MED ORDER — IODIXANOL 320 MG/ML IV SOLN
INTRAVENOUS | Status: DC | PRN
  Administered 2014-11-23: 150 mL via INTRA_ARTERIAL

## 2014-11-23 MED ORDER — ONDANSETRON HCL 4 MG/2ML IJ SOLN
4.0000 mg | Freq: Four times a day (QID) | INTRAMUSCULAR | Status: DC | PRN
Start: 2014-11-23 — End: 2014-11-23

## 2014-11-23 MED ORDER — MIDAZOLAM HCL 2 MG/2ML IJ SOLN
INTRAMUSCULAR | Status: DC | PRN
  Administered 2014-11-23 (×3): 1 mg via INTRAVENOUS

## 2014-11-23 MED ORDER — METOPROLOL TARTRATE 1 MG/ML IV SOLN
2.0000 mg | INTRAVENOUS | Status: DC | PRN
Start: 2014-11-23 — End: 2014-11-23

## 2014-11-23 MED ORDER — OXYCODONE HCL 5 MG PO TABS: 5.0000 mg | ORAL_TABLET | ORAL | Status: DC | PRN

## 2014-11-23 MED ORDER — DOCUSATE SODIUM 100 MG PO CAPS: 100.0000 mg | ORAL_CAPSULE | Freq: Every day | ORAL | Status: DC

## 2014-11-23 MED ORDER — ACETAMINOPHEN 325 MG PO TABS: 325.0000 mg | ORAL_TABLET | ORAL | Status: DC | PRN

## 2014-11-23 MED ORDER — SODIUM CHLORIDE 0.9 % IV SOLN: 1.0000 mL/kg/h | INTRAVENOUS | Status: DC

## 2014-11-23 MED ORDER — HYDRALAZINE HCL 20 MG/ML IJ SOLN
INTRAMUSCULAR | Status: AC
  Filled 2014-11-23: qty 1

## 2014-11-23 MED ORDER — LABETALOL HCL 5 MG/ML IV SOLN: 10.0000 mg | INTRAVENOUS | Status: DC | PRN

## 2014-11-23 MED ORDER — MORPHINE SULFATE 10 MG/ML IJ SOLN: 2.0000 mg | INTRAMUSCULAR | Status: DC | PRN

## 2014-11-23 MED ORDER — HEPARIN (PORCINE) IN NACL 2-0.9 UNIT/ML-% IJ SOLN
INTRAMUSCULAR | Status: AC
  Filled 2014-11-23: qty 1000

## 2014-11-23 MED ORDER — SODIUM CHLORIDE 0.9 % IV SOLN
INTRAVENOUS | Status: DC
Start: 2014-11-23 — End: 2014-11-23
  Administered 2014-11-23: 07:00:00 via INTRAVENOUS

## 2014-11-23 SURGICAL SUPPLY — 26 items
BALLN ARMADA 7X40X80 (BALLOONS) ×4
BALLN CHOCOLATE 5.0X40X120 (BALLOONS) ×4
BALLN LUTONIX DCB 5X40X130 (BALLOONS) ×4
BALLOON ARMADA 7X40X80 (BALLOONS) ×1 IMPLANT
BALLOON CHOCOLATE 5.0X40X120 (BALLOONS) ×1 IMPLANT
BALLOON LUTONIX DCB 5X40X130 (BALLOONS) IMPLANT
BALLOON POWERFLX PRO 9X20X80 (BALLOONS) ×3 IMPLANT
CATH OMNI FLUSH 5F 65CM (CATHETERS) ×3 IMPLANT
CATH SOFT-VU 4F 65 STRAIGHT (CATHETERS) ×2 IMPLANT
CATH SOFT-VU STRAIGHT 4F 65CM (CATHETERS) ×4
COVER PRB 48X5XTLSCP FOLD TPE (BAG) ×1 IMPLANT
COVER PROBE 5X48 (BAG) ×4
DRAPE ZERO GRAVITY STERILE (DRAPES) ×4 IMPLANT
KIT ENCORE 26 ADVANTAGE (KITS) ×3 IMPLANT
KIT MICROINTRODUCER STIFF 5F (SHEATH) ×3 IMPLANT
KIT PV (KITS) ×4 IMPLANT
SHEATH PINNACLE 5F 10CM (SHEATH) ×3 IMPLANT
SHEATH PINNACLE MP 6F 45CM (SHEATH) ×4 IMPLANT
SHIELD RADPAD SCOOP 12X17 (MISCELLANEOUS) ×3 IMPLANT
SYR MEDRAD MARK V 150ML (SYRINGE) ×2 IMPLANT
TRANSDUCER W/STOPCOCK (MISCELLANEOUS) ×4 IMPLANT
TRAY PV CATH (CUSTOM PROCEDURE TRAY) ×4 IMPLANT
WIRE AMPLATZ SS-J .035X180CM (WIRE) ×3 IMPLANT
WIRE BENTSON .035X145CM (WIRE) ×2 IMPLANT
WIRE ROSEN-J .035X180CM (WIRE) ×3 IMPLANT
WIRE SPARTACORE .014X300CM (WIRE) ×3 IMPLANT

## 2014-11-23 NOTE — Discharge Instructions (Signed)
Arteriogram °Care After °These instructions give you information on caring for yourself after your procedure. Your doctor may also give you more specific instructions. Call your doctor if you have any problems or questions after your procedure. °HOME CARE °· Do not bathe, swim, or use a hot tub until directed by your doctor. You can shower. °· Do not lift anything heavier than 10 pounds (about a gallon of milk) for 2 days. °· Do not walk a lot, run, or drive for 2 days. °· Return to normal activities in 2 days or as told by your doctor. °Finding out the results of your test °Ask when your test results will be ready. Make sure you get your test results. °GET HELP RIGHT AWAY IF:  °· You have fever. °· You have more pain in your leg. °· The leg that was cut is: °¨ Bleeding. °¨ Puffy (swollen) or red. °¨ Cold. °¨ Pale or changes color. °¨ Weak. °¨ Tingly or numb. °If you go to the Emergency Room, tell your nurse that you have had an arteriogram. Take this paper with you to show the nurse. °MAKE SURE YOU: °· Understand these instructions. °· Will watch your condition. °· Will get help right away if you are not doing well or get worse. °Document Released: 07/19/2008 Document Revised: 04/27/2013 Document Reviewed: 07/19/2008 °[employer]® Patient Information ©2015 [employer]. This information is not intended to replace advice given to you by your health care provider. Make sure you discuss any questions you have with your health care provider. ° °

## 2014-11-23 NOTE — Op Note (Addendum)
Patient name: David [name] MRN: [id number] DOB: 04-Feb-1951 Sex: male  11/23/2014 Pre-operative Diagnosis: Bypass graft stenosis Post-operative diagnosis:  Same Surgeon:  Durene Cal Procedure Performed:  1.  Ultrasound-guided access, right femoral artery  2.  Abdominal aortogram  3.  Left lower extremity runoff  4.  Drug coated balloon angioplasty left superficial femoral artery  5.  Balloon angioplasty left common iliac artery  6.  Balloon angioplasty right common iliac artery  7.  Follow-up imaging 2   Indications:  The patient has undergone open repair of an abdominal aortic aneurysm with tube graft.  He had a right popliteal aneurysm treated with a covered stent.  He had a left above-the-knee to below-the-knee femoral-popliteal bypass graft with vein to treat his left popliteal aneurysm.  He had proximal anastomotic stenosis requiring stenting.  Recent ultrasound showed progression of velocities within the stent which had previously been dilated.  He is here for further evaluation.  Procedure:  The patient was identified in the holding area and taken to room 8.  The patient was then placed supine on the table and prepped and draped in the usual sterile fashion.  A time out was called.  Ultrasound was used to evaluate the right common femoral artery.  It was patent .  A digital ultrasound image was acquired.  A micropuncture needle was used to access the right common femoral artery under ultrasound guidance.  An 018 wire was advanced without resistance and a micropuncture sheath was placed.  The 018 wire was removed and a benson wire was placed.  The micropuncture sheath was exchanged for a 5 french sheath.  An omniflush catheter was advanced over the wire to the level of L-1.  An abdominal angiogram was obtained.  Next, using the omniflush catheter and a benson wire, the aortic bifurcation was crossed and the catheter was placed into theleft external iliac artery and left runoff was  obtained.   Findings:   Aortogram:  No significant renal artery stenosis is identified.  The abdominal aortic aneurysm repair is intact with no anastomotic degeneration.  There is approximately a 40% right common iliac stenosis and 80% left common iliac stenosis.  Bilateral external iliac arteries are widely patent.  Right Lower Extremity:  Not evaluated  Left Lower Extremity:  The left common femoral and profunda femoral artery are widely patent.  Mild disease is noted within the superficial femoral artery.  There is a stent at the proximal above-knee to below-knee popliteal artery bypass graft with nearly 90% stenosis.  There is three-vessel runoff.  Intervention:  After the above images were acquired the decision was made to proceed with intervention.  The patient was fully heparinized.  A 6 French sheath was advanced into the left external iliac artery.  A 014 wire was advanced through the superficial femoral artery through the stent and into the popliteal artery without resistance.  A 5 x 40 chocolate balloon was used to perform balloon angioplasty and 10 atm for 2 minutes.  Next a drug coated Lutonix 5x40 balloon was used to perform balloon angioplasty of the in-stent stenosis.  Follow-up imaging revealed resolution of the stenosis to less than 5%.  Runoff was unchanged.  Next attention was turned towards the left iliac stenosis which was hemodynamically significant by pressure gradient.  I did not want to get left femoral access and felt I could treat this ostial lesion from the right side.  I could do this using a balloon, having it go in  the right common iliac and left common iliac artery.  This would also address the stenosis within the proximal right common iliac artery.  A Amplatz superstiff wire was placed.  A 7 x 40 balloon was then inserted it was placed in the right common iliac and left common iliac artery and taken to grade 2 pressure and held for 2 minutes.  Completion imaging revealed  significant improvement in the right common iliac stenosis down to less than 15%.  There was still residual stenosis on the left.  I then reinserted a 9 x 20 balloon and perform balloon angioplasty of get of left common iliac stenosis for 2 minutes at 8 atm.  This time the stenosis was approximately 15%.  I did not feel any additional intervention was warranted.  Wire was removed.  The patient will be taken to the holding area for sheath pull.  Impression:  #1  successful drug coated balloon angioplasty of the stent in the proximal anastomosis of the left above-knee to below-knee popliteal artery bypass graft.  Resolution of the stenosis was noted.  #2  balloon angioplasty of the left common iliac stenosis which was 90% down to approximately 15%.  This was done with a 7 x 40 balloon, followed by a 9 x 20 balloon.  #3  successful angioplasty of the proximal right common iliac stenosis from 40% down to less than 10%.  This was with a 7 mm balloon    V. [name], M.D. [facility] Office: [phone] Pager:  [phone]

## 2014-11-23 NOTE — Progress Notes (Addendum)
Right groin level zero after ambulating to  bathroom

## 2014-11-23 NOTE — H&P (View-Only) (Signed)
   Patient name: David [name] MRN: [id number] DOB: 08/15/1950 Sex: male     Chief Complaint  Patient presents with  . Re-evaluation    6 month f/u - carotid, abi's, and Le areterial duplex    HISTORY OF PRESENT ILLNESS: The patient comes back today for followup. He has undergone open abdominal aortic aneurysm repair by Dr. Hayes in 2012. 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 He had his left popliteal aneurysm repaired by above-knee to below-knee bypass in October of 2010 he's had a midline abdominal hernia repair by Dr. gross. He had elevated velocities in the proximal anastomosis of his bypass graft and underwent arteriogram which revealed a high-grade stenosis extending into his bypass graft balloon angioplasty was performed however this resulted in a dissection and he therefore underwent stenting using a 6 mm stent. He was found to have in-stent stenosis on the left. On 01/12/2013 he underwent angiography with a successful balloon angioplasty of the stent using a 6 mm balloon.   the patient is medically managed for diabetes.  His most recent hemoglobin A1c was 6.2.  He takes a statin for hypercholesterolemia.   Past Medical History  Diagnosis Date  . Diverticulosis   . Hemorrhoids   . Hx of adenomatous colonic polyps   . Hyperlipidemia   . Hypertension   . CVA (cerebral vascular accident) 2003  . Anticardiolipin antibody positive   . Perineal abscess   . Diabetes mellitus     type II  . GERD (gastroesophageal reflux disease)   . Peripheral vascular disease     peripheral vascular disease/carotid artery <100%  . Anxiety   . Depression   . 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 CEREBROVASCULAR ACCIDENT, HX OF 07/19/2008  . ANXIETY 07/19/2008  . DEPRESSION 07/19/2008  . DIABETES MELLITUS, TYPE II 01/18/2008  . GERD 07/19/2008  . HYPERLIPIDEMIA 07/19/2008  . HYPERTENSION 07/31/2009  . Increased prostate specific antigen (PSA) velocity 09/13/2010   . Unspecified Peripheral Vascular Disease 01/18/2008  . PERSONAL HX COLONIC POLYPS 12/10/2007  . AAA (abdominal aortic aneurysm)   . Varicose veins   . Internal hemorrhoids     Past Surgical History  Procedure Laterality Date  . Hernia surgury      64 years old  . Perineal abscess    . Sp endo repair infraren aaa    . S/p right and left popliteal aneurysm repair    . 7 abdominal hernia repairs  05/31/10  . Colonoscopy    . Polypectomy    . Abdominal aortic aneurysm repair    . Abdominal aortagram N/A 01/12/2013    Procedure: ABDOMINAL AORTAGRAM;  Surgeon: Zhaire Locker W Cian Costanzo, MD;  Location: [facility] CATH LAB;  Service: Cardiovascular;  Laterality: N/A;    History   Social History  . Marital Status: Married    Spouse Name: N/A  . Number of Children: 1  . Years of Education: N/A   Occupational History  . dsiabled due to stroke    Social History Main Topics  . Smoking status: Former Smoker    Types: Cigarettes    Quit date: 01/14/1992  . Smokeless tobacco: Never Used  . Alcohol Use: 1.2 oz/week    2 Glasses of wine per week     Comment: wine daily  . Drug Use: No  . Sexual Activity: Not on file   Other Topics Concern  . Not on file   Social History Narrative   2 cups   of coffee in the morning and tea throughout the day.    Family History  Problem Relation Age of Onset  . Aortic aneurysm Mother     desceding aoritic aneurysm  . Cancer Brother     Lung cancer  . Colon cancer Neg Hx   . Esophageal cancer Neg Hx   . Stomach cancer Neg Hx   . Rectal cancer Neg Hx     Allergies as of 11/21/2014 - Review Complete 11/21/2014  Allergen Reaction Noted  . Latex Rash 08/03/2010    Current Outpatient Prescriptions on File Prior to Visit  Medication Sig Dispense Refill  . ascorbic Acid (VITAMIN C CR) 500 MG CPCR Take 500 mg by mouth daily.     . aspirin EC 81 MG tablet Take 81 mg by mouth daily.    . atorvastatin (LIPITOR) 40 MG tablet Take 1 tablet (40 mg total) by mouth  daily. 90 tablet 3  . cetirizine (ZYRTEC) 10 MG tablet Take 1 tablet (10 mg total) by mouth daily. 90 tablet 3  . clopidogrel (PLAVIX) 75 MG tablet Take 1 tablet (75 mg total) by mouth daily. 90 tablet 3  . docusate sodium (COLACE) 100 MG capsule Take 100 mg by mouth 2 (two) times daily.     . Fiber CAPS Take 5.7 mg by mouth daily. Take (10) 0.57 capsules by mouth daily for regularity.    . folic acid (FOLVITE) 1 MG tablet Take 1 tablet (1 mg total) by mouth daily. 90 tablet 3  . gemfibrozil (LOPID) 600 MG tablet Take 2 tablets (1,200 mg total) by mouth daily. 180 tablet 3  . mesalamine (CANASA) 1000 MG suppository Place 1 suppository (1,000 mg total) rectally at bedtime. 30 suppository 0  . metFORMIN (GLUCOPHAGE-XR) 500 MG 24 hr tablet Take 4 tablets (2,000 mg total) by mouth daily. 4 tabs by mouth in the AM 360 tablet 3  . metroNIDAZOLE (METROGEL) 0.75 % gel     . Multiple Vitamin (MULTIVITAMIN) capsule Take 1 capsule by mouth daily.     . olmesartan-hydrochlorothiazide (BENICAR HCT) 40-25 MG per tablet Take 1 tablet by mouth daily. 90 tablet 3  . Omega-3 Fatty Acids (FISH OIL PO) Take 2 capsules by mouth daily.     . pioglitazone (ACTOS) 30 MG tablet Take 1 tablet (30 mg total) by mouth daily. 90 tablet 3  . ranitidine (ZANTAC) 150 MG tablet Take 1 tablet (150 mg total) by mouth 2 (two) times daily. 180 tablet 3   No current facility-administered medications on file prior to visit.     REVIEW OF SYSTEMS: Cardiovascular: No chest pain, chest pressure, palpitations, orthopnea, or dyspnea on exertion. No claudication or rest pain,  No history of DVT or phlebitis. Pulmonary: No productive cough, asthma or wheezing. Neurologic: No weakness, paresthesias, aphasia, or amaurosis. No dizziness. Hematologic: No bleeding problems or clotting disorders. Musculoskeletal: No joint pain or joint swelling. Gastrointestinal: No blood in stool or hematemesis Genitourinary: No dysuria or  hematuria. Psychiatric:: No history of major depression. Integumentary: No rashes or ulcers. Constitutional: No fever or chills.  PHYSICAL EXAMINATION:   Vital signs are  Filed Vitals:   11/21/14 1102 11/21/14 1104  BP: 148/88 153/84  Pulse: 68 73  Height: 5' 10" (1.778 m)   Weight: 259 lb (117.482 kg)   SpO2: 99%    Body mass index is 37.16 kg/(m^2). General: The patient appears their stated age. HEENT:  No gross abnormalities Pulmonary:  Non labored breathing Abdomen: Soft and non-tender   Musculoskeletal: There are no major deformities. Neurologic: No focal weakness or paresthesias are detected, Skin: There are no ulcer or rashes noted. Psychiatric: The patient has normal affect. Cardiovascular: There is a regular rate and rhythm without significant murmur appreciated.   Diagnostic Studies  vascular lab studies were ordered and reviewed today. On the right , no significant stenosis is identified.  On the left waveforms have changed from biphasic to monophasic in the superficial femoral and popliteal artery.  No definitive source is identified.  However, the velocity elevation within the stent is 4 38 cm/s.  ABI on the left is 0.78.  On the right is 1.0  Assessment:  abdominal aortic aneurysm  Bilateral popliteal aneurysm Plan:  I discussed the ultrasound findings today with the patient.  I feel that the waveform change in addition to the stenosis within the stent warrant further evaluation by angiography. I wanted to do this 6 months ago but the patient had some financial concerns and therefore we delayed it. Currently he has met his to Dr. Diona Foley, and therefore is willing to proceed.  This will be done on Wednesday, July 20.  I will plan on right femoral artery cannulation with abdominal aortogram and bilateral runoff with intervention on the left leg if I feel that it is appropriate.  Eldridge Abrahams, M.D. [facility] Office:  [phone] Pager:  [phone]

## 2014-11-23 NOTE — Interval H&P Note (Signed)
History and Physical Interval Note:  11/23/2014 8:51 AM  David [name]  has presented today for surgery, with the diagnosis of bilateral anoizium  The various methods of treatment have been discussed with the patient and family. After consideration of risks, benefits and other options for treatment, the patient has consented to  Procedure(s): Abdominal Aortogram (N/A) as a surgical intervention .  The patient's history has been reviewed, patient examined, no change in status, stable for surgery.  I have reviewed the patient's chart and labs.  Questions were answered to the patient's satisfaction.     [name]

## 2014-11-23 NOTE — Progress Notes (Signed)
Assumed care of pt from Anette Guarneri, RN. Assessment documented.

## 2014-11-23 NOTE — Progress Notes (Signed)
Site area: right groin a 5 french sheath ws removed   Site Prior to Removal:  Level 0  Pressure Applied For 20 MINUTES    Minutes Beginning at 1135a  Manual:   Yes.    Patient Status During Pull:  stable  Post Pull Groin Site:  Level 0  Post Pull Instructions Given:  Yes.    Post Pull Pulses Present:  Yes.    Dressing Applied:  Yes.    Comments:  VS remains stable during sheath pull.  Pt denies any discomfort at site at this time.

## 2014-11-23 NOTE — Consult Note (Signed)
CARDIOLOGY CONSULT NOTE   Patient ID: David [name] MRN: [id number], DOB/AGE: [birth year]   Admit date: 11/23/2014 Date of Consult: 11/23/2014   Primary Physician: Cathlean Cower, MD Primary Cardiologist: new (patient's wife request followup with Dr. Angelena Form)  Pt. Profile  64 yo obese Caucasian male with PMH of HTN, HLD, DM type II, PAD, GERD, remote CVA, AAA s/p open repair 2012, positive anticardiolipin antibody, and bilateral LE aneurysm s/p repair in 2010 underwent LE angiography s/p stent to L bypass graft. Prior to the procedure he was noted to be in a-fib  Problem List  Past Medical History  Diagnosis Date  . Diverticulosis   . Hemorrhoids   . Hx of adenomatous colonic polyps   . Hyperlipidemia   . Hypertension   . CVA (cerebral vascular accident) 2003  . Anticardiolipin antibody positive   . Perineal abscess   . Diabetes mellitus     type II  . GERD (gastroesophageal reflux disease)   . Peripheral vascular disease     peripheral vascular disease/carotid artery <100%  . Anxiety   . Depression   . 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 CEREBROVASCULAR ACCIDENT, HX OF 07/19/2008  . ANXIETY 07/19/2008  . DEPRESSION 07/19/2008  . DIABETES MELLITUS, TYPE II 01/18/2008  . GERD 07/19/2008  . HYPERLIPIDEMIA 07/19/2008  . HYPERTENSION 07/31/2009  . Increased prostate specific antigen (PSA) velocity 09/13/2010  . Unspecified Peripheral Vascular Disease 01/18/2008  . PERSONAL HX COLONIC POLYPS 12/10/2007  . AAA (abdominal aortic aneurysm)   . Varicose veins   . Internal hemorrhoids     Past Surgical History  Procedure Laterality Date  . Hernia surgury      64 years old  . Perineal abscess    . Sp endo repair infraren aaa    . S/p right and left popliteal aneurysm repair    . 7 abdominal hernia repairs  05/31/10  . Colonoscopy    . Polypectomy    . Abdominal aortic aneurysm repair    . Abdominal aortagram N/A 01/12/2013    Procedure: ABDOMINAL Maxcine Ham;  Surgeon:  Serafina Mitchell, MD;  Location: [facility] CATH LAB;  Service: Cardiovascular;  Laterality: N/A;     Allergies  Allergies  Allergen Reactions  . Latex Rash    HPI   64 yo obese Caucasian male with PMH of HTN, HLD, DM type II, PAD, GERD, remote CVA, AAA s/p open repair 2012, positive anticardiolipin antibody, and bilateral LE aneurysm s/p repair in 2010. He reportedly had a negative myoview in 2010. He was previously on coumadin for stroke lasted 7 yrs until 2010 and was later transitioned to ASA and plavix. He has been followed by Dr. Nechama Guard for PAD. Per pt, he has been doing ok at home recently. He denies any exertional symptom when mowing the lawn. He has been ambulating ok without significant discomfort. He denies any recent palpitation, SOB, orthopnea or PND. He has chronic LE edema for which he uses compression stocking.    He was seen in the vascular surgery office on 11/21/2015, vascular U/S done shows ABI on L 0.78, R ABI 1.0. L wave forms have changed from biphasic to monophasic in superficial femoral and popliteal artery. After discussing with the patient, he agreed to undergo diagnostic LE angiogram.   Patient presented to the outpatient LE angiogram on 11/23/2014. Morning EKG prior to the procedure showed new atrial fibrillation with controlled HR despite him not on any AV blocking agent. His last EKG in  Sep 2014 was in NSR with 1st degree AV block. He has no cardiac awareness and unkown how long the atrial fibrillation has been going on. He underwent planned LE angiography via R femoral approach, had balloon angioplasty of both L and R common iliac artery, DES to prox anastomosis of L above knee to below knee popliteal bypass graft. Cardiology has been consulted for newly diagnosed a-fib.      Inpatient Medications  . [START ON 11/24/2014] docusate sodium  100 mg Oral Daily    Family History Family History  Problem Relation Age of Onset  . Aortic aneurysm Mother     desceding aoritic  aneurysm  . Cancer Brother     Lung cancer  . Colon cancer Neg Hx   . Esophageal cancer Neg Hx   . Stomach cancer Neg Hx   . Rectal cancer Neg Hx      Social History History   Social History  . Marital Status: Married    Spouse Name: N/A  . Number of Children: 1  . Years of Education: N/A   Occupational History  . dsiabled due to stroke    Social History Main Topics  . Smoking status: Former Smoker    Types: Cigarettes    Quit date: 01/14/1992  . Smokeless tobacco: Never Used  . Alcohol Use: 1.2 oz/week    2 Glasses of wine per week     Comment: wine daily  . Drug Use: No  . Sexual Activity: Not on file   Other Topics Concern  . Not on file   Social History Narrative   2 cups of coffee in the morning and tea throughout the day.     Review of Systems  General:  No chills, fever, night sweats or weight changes.  Cardiovascular:  No chest pain, dyspnea on exertion, edema, orthopnea, palpitations, paroxysmal nocturnal dyspnea. Dermatological: No rash, lesions/masses Respiratory: No cough, dyspnea Urologic: No hematuria, dysuria Abdominal:   No nausea, vomiting, diarrhea, bright red blood per rectum, melena, or hematemesis Neurologic:  No visual changes, wkns, changes in mental status. All other systems reviewed and are otherwise negative except as noted above.  Physical Exam  Blood pressure 127/89, pulse 54, temperature 98.1 F (36.7 C), temperature source Oral, resp. rate 17, height 5\' 10"  (1.778 m), weight 255 lb (115.667 kg), SpO2 99 %.  General: Pleasant, NAD Psych: Normal affect. Neuro: Alert and oriented X 3. Moves all extremities spontaneously. HEENT: Normal  Neck: Supple without bruits or JVD. Lungs:  Resp regular and unlabored, CTA. Heart: irregular. no s3, s4, or murmurs. Abdomen: Soft, non-tender, non-distended, BS + x 4. R groin cath site stable.  Extremities: No clubbing, cyanosis or edema. DP/PT/Radials 2+ and equal bilaterally.  Labs  No  results for input(s): CKTOTAL, CKMB, TROPONINI in the last 72 hours. Lab Results  Component Value Date   WBC 5.6 08/16/2014   HGB 15.0 11/23/2014   HCT 44.0 11/23/2014   MCV 93.2 08/16/2014   PLT 279.0 08/16/2014    Recent Labs Lab 11/23/14 0642  NA 139  K 3.8  CL 102  BUN 25*  CREATININE 1.10  GLUCOSE 137*   Lab Results  Component Value Date   CHOL 134 08/03/2014   HDL 34.30* 08/03/2014   LDLCALC 65 01/19/2014   TRIG 227.0* 08/03/2014   No results found for: DDIMER  Radiology/Studies  No results found.  ECG  A-fib with rate controlled HR  ASSESSMENT AND PLAN  1. Newly diagnosed atrial fibrillation  of unknown duration  - CHA2DS2-Vasc score 6 (age, HTN, DM, PAD, stroke), will need systemic anticoagulation  - likely start eliquis 5mg  BID tomorrow. Self rate controlled despite not on any AV nodal blocking agent  - will do triple therapy with ASA, plavix and eliquis for one month, and potentially drop ASA  - will order outpatient echo and office folllowup. No CP recently, does not need any further inpatient evaluation  2. PAD s/p LE angiography via R femoral approach, had balloon angioplasty of both L and R common iliac artery, DES to prox anastomosis of L above knee to below knee popliteal bypass graft  3. HTN 4. HLD 5. DM type II 6. remote CVA 7. AAA s/p open repair 2012 8. positive anticardiolipin antibody 9. bilateral LE aneurysm s/p repair in 2010  Signed, Meng, Hao, [place] 11/23/2014, 2:57 PM Patient seen and examined and history reviewed. Agree with above findings and plan. Pleasant 64 yo WM seen for evaluation of newly recognized atrial fibrillation. He underwent outpatient PV intervention today by Dr. Trula Slade. Pre procedure Ecg showed atrial fibrillation with controlled rate. He is completely asymptomatic. [place] vasc score is 6- high risk for CVA. HASBLED score is low. He is currently on ASA and Plavix. Since he just had a intervention today I would continue  ASA and Plavix for one month. I would recommend adding a NOAC starting tomorrow evening. Plan to stop ASA at one month. Since he is asymptomatic I do not see a reason to try and restore NSR now. We will arrange outpatient Echo and follow up. (Patient's wife sees Dr. Angelena Form and they would like follow up with him). He is on Zantac for GI prophylaxis and is instructed to avoid NSAIDs.     David [name], [place] 11/23/2014 3:21 PM

## 2014-11-24 ENCOUNTER — Telehealth: Payer: Self-pay | Admitting: Surgery

## 2014-11-24 ENCOUNTER — Encounter (HOSPITAL_COMMUNITY): Payer: Self-pay | Admitting: Surgery

## 2014-11-24 NOTE — Telephone Encounter (Signed)
Spoke with pt re appt, dpm  °

## 2014-11-24 NOTE — Telephone Encounter (Signed)
-----   Message from Mena Goes, RN sent at 11/23/2014 10:40 AM EDT ----- Regarding: Schedule   ----- Message -----    From: Serafina Mitchell, MD    Sent: 11/23/2014  10:20 AM      To: [facility] Charge Pool  11/23/2014:  Surgeon:  [name] Procedure Performed:  1.  Ultrasound-guided access, right femoral artery  2.  Abdominal aortogram  3.  Left lower extremity runoff  4.  Drug coated balloon angioplasty left superficial femoral artery  5.  Balloon angioplasty left common iliac artery  6.  Balloon angioplasty right common iliac artery  7.  Follow-up imaging 2   Follow-up 3 months with a duplex of the iliac artery and left lower extremity vessels with ABI

## 2014-11-28 ENCOUNTER — Telehealth (HOSPITAL_COMMUNITY): Payer: Self-pay | Admitting: *Deleted

## 2014-11-28 NOTE — Telephone Encounter (Addendum)
Spoke with pt wife, they are planning to do the procedure Friday this week at 4 pm. He has a f/u appt 12-08-14. They need to know if okay to hold eliquis for the procedure Pt was seen by dr [place] in the hosp and is following up with dr Angelena Form Dr Martinique is out this week, will ask dr Angelena Form to review

## 2014-11-28 NOTE — Telephone Encounter (Signed)
David [name] from [facility]  ( Dr. Marcello Moores) wants to know if this patient should stop his Eliquis prior to his in office procedure. He is having a excision of the anal skin tag.  Please call [phone]. A form was sent over on Friday as well.

## 2014-11-29 NOTE — Telephone Encounter (Signed)
Debra, I have not met him. I think the family requested follow up with me. I have looked at the chart and it looks like he should be able to hold his Eliquis two days before the procedure but I would not hold Plavix. Darlina Guys

## 2014-11-29 NOTE — Telephone Encounter (Signed)
Spoke with pt wife, aware of dr mcalhany's recommendations. 

## 2014-12-01 ENCOUNTER — Other Ambulatory Visit: Payer: Self-pay

## 2014-12-01 ENCOUNTER — Ambulatory Visit (HOSPITAL_COMMUNITY): Payer: Managed Care, Other (non HMO) | Attending: Physician Assistant

## 2014-12-01 DIAGNOSIS — I10 Essential (primary) hypertension: Secondary | ICD-10-CM | POA: Diagnosis not present

## 2014-12-01 DIAGNOSIS — I517 Cardiomegaly: Secondary | ICD-10-CM | POA: Insufficient documentation

## 2014-12-01 DIAGNOSIS — E785 Hyperlipidemia, unspecified: Secondary | ICD-10-CM | POA: Insufficient documentation

## 2014-12-01 DIAGNOSIS — I340 Nonrheumatic mitral (valve) insufficiency: Secondary | ICD-10-CM | POA: Diagnosis not present

## 2014-12-01 DIAGNOSIS — I4891 Unspecified atrial fibrillation: Secondary | ICD-10-CM | POA: Diagnosis not present

## 2014-12-01 DIAGNOSIS — E119 Type 2 diabetes mellitus without complications: Secondary | ICD-10-CM | POA: Diagnosis not present

## 2014-12-01 DIAGNOSIS — I77819 Aortic ectasia, unspecified site: Secondary | ICD-10-CM | POA: Diagnosis not present

## 2014-12-02 ENCOUNTER — Telehealth: Payer: Self-pay | Admitting: Cardiovascular Disease

## 2014-12-02 NOTE — Telephone Encounter (Signed)
Spoke with pt's wife. Pt is having procedure this afternoon.  I told her surgeon doing procedure would give pt instructions on resuming Eliquis.

## 2014-12-02 NOTE — Telephone Encounter (Signed)
Pt's wife called:    Pt would like to Know when he can restart his Eliquis after his procedure.   Please give pt's wife a call in cell.

## 2014-12-07 NOTE — Progress Notes (Signed)
Cardiology Office Note   Date:  12/08/2014   ID:  David [name], DOB March 28, 1951, MRN [id number]  PCP:  Cathlean Cower, MD  Cardiologist:  Dr. Lauree Chandler   Electrophysiologist:  N/a   Chief Complaint  Patient presents with  . Atrial Fibrillation     History of Present Illness: David [name] is a 65 y.o. 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 He is status post bilateral LE aneurysm repair in 2010 and LE angiography status post stenting to the left bypass graft 11/23/2014. Prior to his most recent lower extremity procedure, he was noted to be in atrial fibrillation. Heart rate was controlled without AV nodal blocking agents. The patient's LE procedure included angioplasty of both L and R CIAs and DES to prox anastomosis of L above knee to below knee popliteal BPG.  CHADS2-VASc=6.   Eliquis was added with plans to remain on Eliquis, ASA, Plavix x 1 month.  He will then remain on Eliquis and Plavix.  He was felt to be asymptomatic and, therefore, rate control strategy was recommended.  OP echo was obtained and demonstrated EF 55-60%, BAE, mildly dilated ascending aorta.    He returns for FU.  He is here alone.  Doing well.  Walks on a treadmill 3 x a week. No dyspnea.  No chest pain.  No syncope.  No orthopnea, PND, edema.  No claudication.  STOP-BANG:  4.   Studies/Reports Reviewed Today:  Echo 12/01/14 EF 55-60%, no RWMA, mild dilated ascending aorta, mild MR, mod LAE, mild RAE, trace TR  Carotid US 11/21/14 R 40-59% L occluded  Myoview 10/2008 Overall Impression: Normal stress nuclear study.   Past Medical History  Diagnosis Date  . Diverticulosis   . Hemorrhoids   . Hx of adenomatous colonic polyps   . Hyperlipidemia   . Hypertension   . CVA (cerebral vascular accident) 2003  . Anticardiolipin antibody positive   . Perineal abscess   . Diabetes mellitus     type II  .  GERD (gastroesophageal reflux disease)   . Peripheral vascular disease     peripheral vascular disease/carotid artery <100%  . Anxiety   . Depression   . 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 CEREBROVASCULAR ACCIDENT, HX OF 07/19/2008  . ANXIETY 07/19/2008  . DEPRESSION 07/19/2008  . DIABETES MELLITUS, TYPE II 01/18/2008  . GERD 07/19/2008  . HYPERLIPIDEMIA 07/19/2008  . HYPERTENSION 07/31/2009  . Increased prostate specific antigen (PSA) velocity 09/13/2010  . Unspecified Peripheral Vascular Disease 01/18/2008  . PERSONAL HX COLONIC POLYPS 12/10/2007  . AAA (abdominal aortic aneurysm)   . Varicose veins   . Internal hemorrhoids     Past Surgical History  Procedure Laterality Date  . Hernia surgury      64 years old  . Perineal abscess    . Sp endo repair infraren aaa    . S/p right and left popliteal aneurysm repair    . 7 abdominal hernia repairs  05/31/10  . Colonoscopy    . Polypectomy    . Abdominal aortic aneurysm repair    . Abdominal aortagram N/A 01/12/2013    Procedure: ABDOMINAL Maxcine Ham;  Surgeon: Serafina Mitchell, MD;  Location: [facility] CATH LAB;  Service: Cardiovascular;  Laterality: N/A;  . Peripheral vascular catheterization N/A 11/23/2014    Procedure: Abdominal Aortogram;  Surgeon: Serafina Mitchell, MD;  Location: [facility];  Service: Cardiovascular;  Laterality: N/A;  . Peripheral vascular catheterization  11/23/2014    Procedure: Lower Extremity Angiography;  Surgeon: Serafina Mitchell, MD;  Location: [facility];  Service: Cardiovascular;;  . Peripheral vascular catheterization  11/23/2014    Procedure: Peripheral Vascular Intervention;  Surgeon: Serafina Mitchell, MD;  Location: [facility];  Service: Cardiovascular;;  PTA bilateral common iliac PTA/DCB left fem-pop     Current Outpatient Prescriptions  Medication Sig Dispense Refill  . apixaban (ELIQUIS) 5 MG TABS tablet Take 1 tablet (5 mg total) by mouth 2 (two) times daily. 60 tablet 0    . ascorbic Acid (VITAMIN C CR) 500 MG CPCR Take 500 mg by mouth daily.     Marland Kitchen aspirin EC 81 MG tablet Take 81 mg by mouth daily.    Marland Kitchen atorvastatin (LIPITOR) 40 MG tablet Take 1 tablet (40 mg total) by mouth daily. 90 tablet 3  . cetirizine (ZYRTEC) 10 MG tablet Take 1 tablet (10 mg total) by mouth daily. 90 tablet 3  . clopidogrel (PLAVIX) 75 MG tablet Take 1 tablet (75 mg total) by mouth daily. 90 tablet 3  . docusate sodium (COLACE) 100 MG capsule Take 100 mg by mouth 2 (two) times daily.     . Fiber CAPS Take 5.7 mg by mouth See admin instructions. Take (10) 0.57 capsules by mouth daily for regularity.    . folic acid (FOLVITE) 1 MG tablet Take 1 tablet (1 mg total) by mouth daily. 90 tablet 3  . gemfibrozil (LOPID) 600 MG tablet Take 2 tablets (1,200 mg total) by mouth daily. 180 tablet 3  . mesalamine (CANASA) 1000 MG suppository Place 1,000 mg rectally daily as needed (hemmorroids).    . metFORMIN (GLUCOPHAGE-XR) 500 MG 24 hr tablet Take 4 tablets (2,000 mg total) by mouth daily. 4 tabs by mouth in the AM 360 tablet 3  . metroNIDAZOLE (METROGEL) 0.75 % gel Apply 1 application topically 2 (two) times daily.     . Multiple Vitamin (MULTIVITAMIN) capsule Take 1 capsule by mouth daily.     Marland Kitchen olmesartan-hydrochlorothiazide (BENICAR HCT) 40-25 MG per tablet Take 1 tablet by mouth daily. 90 tablet 3  . Omega-3 Fatty Acids (FISH OIL PO) Take 2 capsules by mouth daily.     . pioglitazone (ACTOS) 30 MG tablet Take 1 tablet (30 mg total) by mouth daily. 90 tablet 3  . ranitidine (ZANTAC) 150 MG tablet Take 1 tablet (150 mg total) by mouth 2 (two) times daily. 180 tablet 3   No current facility-administered medications for this visit.    Allergies:   Latex    Social History:  The patient  reports that he quit smoking about 22 years ago. His smoking use included Cigarettes. He has never used smokeless tobacco. He reports that he drinks about 1.2 oz of alcohol per week. He reports that he does not  use illicit drugs.   Family History:  The patient's family history includes Aortic aneurysm in his mother; Cancer in his brother. There is no history of Colon cancer, Esophageal cancer, Stomach cancer, or Rectal cancer.    ROS:   Please see the history of present illness.   Review of Systems  All other systems reviewed and are negative.    PHYSICAL EXAM: VS:  BP 118/70 mmHg  Pulse 75  Ht 5\' 10"  (1.778 m)  Wt 250 lb 12.8 oz (113.762 kg)  BMI 35.99 kg/m2    Wt Readings from  Last 3 Encounters:  12/08/14 250 lb 12.8 oz (113.762 kg)  11/23/14 255 lb (115.667 kg)  11/21/14 259 lb (117.482 kg)     GEN: Well nourished, well developed, in no acute distress HEENT: normal Neck: no JVD,  no masses Cardiac:  Normal S1/S2, irregularly irregular rhythm; no murmur ,  no rubs or gallops, no edema   Respiratory:  clear to auscultation bilaterally, no wheezing, rhonchi or rales. GI: soft, nontender, nondistended, + BS MS: no deformity or atrophy Skin: warm and dry  Neuro:  CNs II-XII intact, Strength and sensation are intact Psych: Normal affect   EKG:  EKG is ordered today.  It demonstrates:   Atrial fibrillation, HR 75, Q waves in V1-V2, no change from prior tracings   Recent Labs: 08/03/2014: ALT 22; TSH 1.59 08/16/2014: Platelets 279.0 11/23/2014: BUN 25*; Creatinine, Ser 1.10; Hemoglobin 15.0; Potassium 3.8; Sodium 139    Lipid Panel    Component Value Date/Time   CHOL 134 08/03/2014 0818   TRIG 227.0* 08/03/2014 0818   HDL 34.30* 08/03/2014 0818   CHOLHDL 4 08/03/2014 0818   VLDL 45.4* 08/03/2014 0818   LDLCALC 65 01/19/2014 0739   LDLDIRECT 57.0 08/03/2014 0818      ASSESSMENT AND PLAN:  Persistent atrial fibrillation:  Rate is controlled.  We discussed the pathophysiology of AFib and the different strategies for treatment.  He is asymptomatic and HR is controlled. I agree with Dr. Peter Martinique in pursuing rate control and the patient also agrees.  Continue Eliquis.   Refer to [facility] for FU in 6 weeks.  Check CBC, BMET in 6 weeks.  He has a lot of RFs for OSA.  STOP-BANG: 4.  I have asked him to get his wife to tell him if he snores.  If so, I will get a sleep study.    Essential hypertension:  Controlled.   HLD (hyperlipidemia):  Managed by PCP.  Continue statin.  Peripheral vascular disease;  FU with [facility].  He has a lot of RFs for CAD.  However, he does not have CP or symptoms to suggest ischemia.  Continue antiplatelet Rx, statin.  He will take ASA, Plavix, Eliquis for 30 days post LE procedure.  He will then stop ASA and remain on Eliquis + Plavix only.   Carotid stenosis, bilateral:  FU with [facility].  S/P AAA (abdominal aortic aneurysm) repair:  FU with [facility].  History of CVA (cerebrovascular accident):  Continue antiplatelet, statin.     Medication Changes: Current medicines are reviewed at length with the patient today.  Concerns regarding medicines are as outlined above.  The following changes have been made:   Discontinued Medications   MESALAMINE (CANASA) 1000 MG SUPPOSITORY    Place 1 suppository (1,000 mg total) rectally at bedtime.   Modified Medications   No medications on file   New Prescriptions   No medications on file    Labs/ tests ordered today include:   Orders Placed This Encounter  Procedures  . Basic Metabolic Panel (BMET)  . CBC w/Diff  . EKG 12-Lead     Disposition:   FU with Dr. Lauree Chandler 2-3 mos.     Signed, Versie Starks, MHS 12/08/2014 12:04 PM    [facility] [place], [place],  [place]  [address] Phone: [phone]; Fax: [phone]

## 2014-12-08 ENCOUNTER — Ambulatory Visit (INDEPENDENT_AMBULATORY_CARE_PROVIDER_SITE_OTHER): Payer: BLUE CROSS/BLUE SHIELD | Admitting: Physician Assistant

## 2014-12-08 ENCOUNTER — Encounter: Payer: Self-pay | Admitting: Physician Assistant

## 2014-12-08 VITALS — BP 118/70 | HR 75 | Ht 70.0 in | Wt 250.8 lb

## 2014-12-08 DIAGNOSIS — I481 Persistent atrial fibrillation: Secondary | ICD-10-CM | POA: Diagnosis not present

## 2014-12-08 DIAGNOSIS — Z9889 Other specified postprocedural states: Secondary | ICD-10-CM

## 2014-12-08 DIAGNOSIS — Z8679 Personal history of other diseases of the circulatory system: Secondary | ICD-10-CM | POA: Insufficient documentation

## 2014-12-08 DIAGNOSIS — Z8673 Personal history of transient ischemic attack (TIA), and cerebral infarction without residual deficits: Secondary | ICD-10-CM

## 2014-12-08 DIAGNOSIS — I10 Essential (primary) hypertension: Secondary | ICD-10-CM | POA: Diagnosis not present

## 2014-12-08 DIAGNOSIS — E785 Hyperlipidemia, unspecified: Secondary | ICD-10-CM

## 2014-12-08 DIAGNOSIS — I6523 Occlusion and stenosis of bilateral carotid arteries: Secondary | ICD-10-CM

## 2014-12-08 DIAGNOSIS — I739 Peripheral vascular disease, unspecified: Secondary | ICD-10-CM

## 2014-12-08 DIAGNOSIS — I4819 Other persistent atrial fibrillation: Secondary | ICD-10-CM | POA: Insufficient documentation

## 2014-12-08 NOTE — Patient Instructions (Signed)
Medication Instructions:  1. STOP ASPRIN 30 DAYS AFTER  YOUR LEG PROCEDURE  Labwork: BMET, CBC W/DIFF IN 6 WEEKS SAME DAY YOU SEE THE COUMADIN CLINIC FOR YOUR ELIQUIS APPT  Testing/Procedures: NONE  Follow-Up: 1. 2-3 MONTHS WITH DR. Angelena Form  2. YOU WILL NEED A NEW PT APPT WITH THE COUMADIN CLINIC DUE TO NEW START ELIQUIS; THIS APPT NEEDS TO BE IN 6 WEEKS  Any Other Special Instructions Will Be Listed Below (If Applicable). PER PA TO ASK YOUR WIFE IF YOU SNORE; IF YES PLEASE CALL THE OFFICE [phone]

## 2015-01-19 ENCOUNTER — Ambulatory Visit (INDEPENDENT_AMBULATORY_CARE_PROVIDER_SITE_OTHER): Payer: BLUE CROSS/BLUE SHIELD | Admitting: *Deleted

## 2015-01-19 ENCOUNTER — Other Ambulatory Visit (INDEPENDENT_AMBULATORY_CARE_PROVIDER_SITE_OTHER): Payer: BLUE CROSS/BLUE SHIELD

## 2015-01-19 ENCOUNTER — Ambulatory Visit: Payer: BLUE CROSS/BLUE SHIELD | Admitting: Pharmacist

## 2015-01-19 DIAGNOSIS — I4891 Unspecified atrial fibrillation: Secondary | ICD-10-CM | POA: Diagnosis not present

## 2015-01-19 DIAGNOSIS — I4819 Other persistent atrial fibrillation: Secondary | ICD-10-CM

## 2015-01-19 DIAGNOSIS — I481 Persistent atrial fibrillation: Secondary | ICD-10-CM | POA: Diagnosis not present

## 2015-01-19 LAB — CBC WITH DIFFERENTIAL/PLATELET
BASOS PCT: 1.5 % (ref 0.0–3.0)
Basophils Absolute: 0.1 10*3/uL (ref 0.0–0.1)
Eosinophils Absolute: 0.5 10*3/uL (ref 0.0–0.7)
Eosinophils Relative: 10.2 % — ABNORMAL HIGH (ref 0.0–5.0)
HEMATOCRIT: 40 % (ref 39.0–52.0)
Hemoglobin: 13.8 g/dL (ref 13.0–17.0)
LYMPHS PCT: 25.5 % (ref 12.0–46.0)
Lymphs Abs: 1.1 10*3/uL (ref 0.7–4.0)
MCHC: 34.4 g/dL (ref 30.0–36.0)
MCV: 94.9 fl (ref 78.0–100.0)
MONOS PCT: 10.9 % (ref 3.0–12.0)
Monocytes Absolute: 0.5 10*3/uL (ref 0.1–1.0)
NEUTROS PCT: 51.9 % (ref 43.0–77.0)
Neutro Abs: 2.3 10*3/uL (ref 1.4–7.7)
Platelets: 210 10*3/uL (ref 150.0–400.0)
RBC: 4.21 Mil/uL — ABNORMAL LOW (ref 4.22–5.81)
RDW: 13.2 % (ref 11.5–15.5)
WBC: 4.5 10*3/uL (ref 4.0–10.5)

## 2015-01-19 LAB — BASIC METABOLIC PANEL
BUN: 30 mg/dL — ABNORMAL HIGH (ref 6–23)
CO2: 25 meq/L (ref 19–32)
Calcium: 9.9 mg/dL (ref 8.4–10.5)
Chloride: 102 mEq/L (ref 96–112)
Creatinine, Ser: 1.05 mg/dL (ref 0.40–1.50)
GFR: 75.04 mL/min (ref >60.00)
Glucose, Bld: 124 mg/dL — ABNORMAL HIGH (ref 70–99)
Potassium: 4.2 mEq/L (ref 3.5–5.1)
SODIUM: 135 meq/L (ref 135–145)

## 2015-01-19 NOTE — Progress Notes (Signed)
Pt was started on Eliquis 5mg  twice a day for Afib on November 23, 2014.    Reviewed patients medication list.  Pt is not  currently on any combined P-gp and strong CYP3A4 inhibitors/inducers (ketoconazole, traconazole, ritonavir, carbamazepine, phenytoin, rifampin, St. John's wort).  Reviewed labs.  SCr 1.05, Weight 117.4 Kg, Age 64 yrs old.   Dose appropriate based on specified criteria.   Hgb and HCT 13.8/40 on 01/19/15.  A full discussion of the nature of anticoagulants has been carried out.  A benefit/risk analysis has been presented to the patient, so that they understand the justification for choosing anticoagulation with Eliquis at this time.  The need for compliance is stressed.  Pt is aware to take the medication twice daily.  Side effects of potential bleeding are discussed, including unusual colored urine or stools, coughing up blood or coffee ground emesis, nose bleeds or serious fall or head trauma.  Discussed signs and symptoms of stroke. The patient should avoid any OTC items containing aspirin or ibuprofen.  Avoid alcohol consumption.   Call if any signs of abnormal bleeding.  Discussed financial obligations and resolved any difficulty in obtaining medication.   Spoke with pt advised of lab results and to continue on same dosage of Eliquis 5mg  BID.

## 2015-01-20 ENCOUNTER — Telehealth: Payer: Self-pay | Admitting: *Deleted

## 2015-01-20 NOTE — Telephone Encounter (Signed)
Pt notified of lab results by phone with verbal understanding.  

## 2015-02-17 ENCOUNTER — Ambulatory Visit (INDEPENDENT_AMBULATORY_CARE_PROVIDER_SITE_OTHER): Payer: BLUE CROSS/BLUE SHIELD | Admitting: Cardiovascular Disease

## 2015-02-17 ENCOUNTER — Encounter: Payer: Self-pay | Admitting: Cardiovascular Disease

## 2015-02-17 VITALS — BP 136/82 | HR 60 | Ht 70.0 in | Wt 259.8 lb

## 2015-02-17 DIAGNOSIS — Z8679 Personal history of other diseases of the circulatory system: Secondary | ICD-10-CM

## 2015-02-17 DIAGNOSIS — Z9889 Other specified postprocedural states: Secondary | ICD-10-CM

## 2015-02-17 DIAGNOSIS — I6523 Occlusion and stenosis of bilateral carotid arteries: Secondary | ICD-10-CM | POA: Diagnosis not present

## 2015-02-17 DIAGNOSIS — I10 Essential (primary) hypertension: Secondary | ICD-10-CM | POA: Diagnosis not present

## 2015-02-17 DIAGNOSIS — E785 Hyperlipidemia, unspecified: Secondary | ICD-10-CM

## 2015-02-17 DIAGNOSIS — I481 Persistent atrial fibrillation: Secondary | ICD-10-CM

## 2015-02-17 DIAGNOSIS — I4819 Other persistent atrial fibrillation: Secondary | ICD-10-CM

## 2015-02-17 DIAGNOSIS — I739 Peripheral vascular disease, unspecified: Secondary | ICD-10-CM

## 2015-02-17 NOTE — Progress Notes (Signed)
Chief Complaint  Patient presents with  . Follow-up    AFIB      History of Present Illness: 64 y.o. male with h/o HTN, HLD, DM, atrial fibrillation, PAD, AAA status post open repair 2012, remote CVA, GERD here today for cardiac follow up. He is s/p bilateral LE aneurysm repair in 2010 and LE angiography status post stenting to the left bypass graft 11/23/2014. Prior to his most recent lower extremity procedure, he was noted to be in atrial fibrillation. Heart rate was controlled without AV nodal blocking agents. The patient's LE procedure included angioplasty of both L and R CIAs and drug coated balloon to prox anastomosis of L above knee to below knee popliteal BPG. He was seen in consultation by Dr. Martinique. CHADS2-VASc=6. Eliquis was added with plans to remain on Eliquis, ASA, Plavix x 1 month. He is now on Plavix and Eliquis. He requested to follow up with me since his wife is my patient. He was seen in f/u in our office by Nicki Reaper . He will then remain on Eliquis and Plavix. He was felt to be asymptomatic and, therefore, rate control strategy was recommended. Echo was obtained and demonstrated EF 55-60%, BAE, mildly dilated ascending aorta.   He is here today for follow up. He is feeling well. No chest pain or SOB. No dizziness, palpitations.   Primary Care Physician: Cathlean Cower, MD   Past Medical History  Diagnosis Date  . Diverticulosis   . Hemorrhoids   . Hx of adenomatous colonic polyps   . Hyperlipidemia   . Hypertension   . CVA (cerebral vascular accident) ([place]) 2003  . Anticardiolipin antibody positive   . Perineal abscess   . Diabetes mellitus     type II  . GERD (gastroesophageal reflux disease)   . Peripheral vascular disease ([place])     peripheral vascular disease/carotid artery <100%  . Anxiety   . Depression   . 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 ([place]) 09/07/2008  . CEREBROVASCULAR ACCIDENT, HX OF 07/19/2008  . ANXIETY 07/19/2008  . DEPRESSION 07/19/2008  .  DIABETES MELLITUS, TYPE II 01/18/2008  . GERD 07/19/2008  . HYPERLIPIDEMIA 07/19/2008  . HYPERTENSION 07/31/2009  . Increased prostate specific antigen (PSA) velocity 09/13/2010  . Unspecified Peripheral Vascular Disease 01/18/2008  . PERSONAL HX COLONIC POLYPS 12/10/2007  . AAA (abdominal aortic aneurysm) ([place])   . Varicose veins   . Internal hemorrhoids     Past Surgical History  Procedure Laterality Date  . Hernia surgury      64 years old  . Perineal abscess    . Sp endo repair infraren aaa    . S/p right and left popliteal aneurysm repair    . 7 abdominal hernia repairs  05/31/10  . Colonoscopy    . Polypectomy    . Abdominal aortic aneurysm repair    . Abdominal aortagram N/A 01/12/2013    Procedure: ABDOMINAL Maxcine Ham;  Surgeon: Serafina Mitchell, MD;  Location: [facility] CATH LAB;  Service: Cardiovascular;  Laterality: N/A;  . Peripheral vascular catheterization N/A 11/23/2014    Procedure: Abdominal Aortogram;  Surgeon: Serafina Mitchell, MD;  Location: [facility];  Service: Cardiovascular;  Laterality: N/A;  . Peripheral vascular catheterization  11/23/2014    Procedure: Lower Extremity Angiography;  Surgeon: Serafina Mitchell, MD;  Location: [facility];  Service: Cardiovascular;;  . Peripheral vascular catheterization  11/23/2014    Procedure: Peripheral Vascular Intervention;  Surgeon: Serafina Mitchell, MD;  Location: [facility];  Service: Cardiovascular;;  PTA bilateral common iliac PTA/DCB left fem-pop    Current Outpatient Prescriptions  Medication Sig Dispense Refill  . apixaban (ELIQUIS) 5 MG TABS tablet Take 1 tablet (5 mg total) by mouth 2 (two) times daily. 60 tablet 0  . ascorbic Acid (VITAMIN C CR) 500 MG CPCR Take 500 mg by mouth daily.     Marland Kitchen atorvastatin (LIPITOR) 40 MG tablet Take 1 tablet (40 mg total) by mouth daily. 90 tablet 3  . cetirizine (ZYRTEC) 10 MG tablet Take 1 tablet (10 mg total) by mouth daily. 90 tablet 3  . clopidogrel (PLAVIX) 75 MG  tablet Take 1 tablet (75 mg total) by mouth daily. 90 tablet 3  . docusate sodium (COLACE) 100 MG capsule Take 100 mg by mouth 2 (two) times daily.     . Fiber CAPS Take 5.7 mg by mouth See admin instructions. Take (10) 0.57 capsules by mouth daily for regularity.    . folic acid (FOLVITE) 1 MG tablet Take 1 tablet (1 mg total) by mouth daily. 90 tablet 3  . gemfibrozil (LOPID) 600 MG tablet Take 2 tablets (1,200 mg total) by mouth daily. 180 tablet 3  . mesalamine (CANASA) 1000 MG suppository Place 1,000 mg rectally daily as needed (hemmorroids).    . metFORMIN (GLUCOPHAGE-XR) 500 MG 24 hr tablet Take 4 tablets (2,000 mg total) by mouth daily. 4 tabs by mouth in the AM 360 tablet 3  . metroNIDAZOLE (METROGEL) 0.75 % gel Apply 1 application topically 2 (two) times daily.     . Multiple Vitamin (MULTIVITAMIN) capsule Take 1 capsule by mouth daily.     Marland Kitchen olmesartan-hydrochlorothiazide (BENICAR HCT) 40-25 MG per tablet Take 1 tablet by mouth daily. 90 tablet 3  . Omega-3 Fatty Acids (FISH OIL PO) Take 2 capsules by mouth daily.     . pioglitazone (ACTOS) 30 MG tablet Take 1 tablet (30 mg total) by mouth daily. 90 tablet 3  . ranitidine (ZANTAC) 150 MG tablet Take 1 tablet (150 mg total) by mouth 2 (two) times daily. 180 tablet 3   No current facility-administered medications for this visit.    Allergies  Allergen Reactions  . Latex Rash    Social History   Social History  . Marital Status: Married    Spouse Name: N/A  . Number of Children: 1  . Years of Education: N/A   Occupational History  . dsiabled due to stroke    Social History Main Topics  . Smoking status: Former Smoker    Types: Cigarettes    Quit date: 01/14/1992  . Smokeless tobacco: Never Used  . Alcohol Use: 1.2 oz/week    2 Glasses of wine per week     Comment: wine daily  . Drug Use: No  . Sexual Activity: Not on file   Other Topics Concern  . Not on file   Social History Narrative   2 cups of coffee in the  morning and tea throughout the day.    Family History  Problem Relation Age of Onset  . Aortic aneurysm Mother     desceding aoritic aneurysm  . Cancer Brother     Lung cancer  . Colon cancer Neg Hx   . Esophageal cancer Neg Hx   . Stomach cancer Neg Hx   . Rectal cancer Neg Hx     Review of Systems:  As stated in the HPI and otherwise negative.   BP 136/82 mmHg  Pulse 60  Ht 5\' 10"  (1.778 m)  Wt 259 lb 12.8 oz (117.845 kg)  BMI 37.28 kg/m2  SpO2 99%  Physical Examination: General: Well developed, well nourished, NAD HEENT: OP clear, mucus membranes moist SKIN: warm, dry. No rashes. Neuro: No focal deficits Musculoskeletal: Muscle strength 5/5 all ext Psychiatric: Mood and affect normal Neck: No JVD, no carotid bruits, no thyromegaly, no lymphadenopathy. Lungs:Clear bilaterally, no wheezes, rhonci, crackles Cardiovascular: Irregular. No murmurs, gallops or rubs. Abdomen:Soft. Bowel sounds present. Non-tender.  Extremities: No lower extremity edema.   EKG:  EKG is not ordered today. The ekg ordered today demonstrates   Recent Labs: 08/03/2014: ALT 22; TSH 1.59 01/19/2015: BUN 30*; Creatinine, Ser 1.05; Hemoglobin 13.8; Platelets 210.0; Potassium 4.2; Sodium 135   Lipid Panel    Component Value Date/Time   CHOL 134 08/03/2014 0818   TRIG 227.0* 08/03/2014 0818   HDL 34.30* 08/03/2014 0818   CHOLHDL 4 08/03/2014 0818   VLDL 45.4* 08/03/2014 0818   LDLCALC 65 01/19/2014 0739   LDLDIRECT 57.0 08/03/2014 0818     Wt Readings from Last 3 Encounters:  02/17/15 259 lb 12.8 oz (117.845 kg)  12/08/14 250 lb 12.8 oz (113.762 kg)  11/23/14 255 lb (115.667 kg)     Other studies Reviewed: Additional studies/ records that were reviewed today include: all old records, hospital, echo images.  Review of the above records demonstrates:    Assessment and Plan:   1. Persistent atrial fibrillation: Rate is controlled.He is in atrial fib today. He is asymptomatic. Will  continue continue Eliquis.    2. Essential hypertension: Controlled. No changes.   3. HLD (hyperlipidemia): Managed by PCP. Continue statin.  4. Peripheral vascular disease: Follow up with [facility]. He has a lot of RFs for CAD. However, he does not have CP or symptoms to suggest ischemia. We discussed stress testing but he wishes to wait until spring 2017. Continue antiplatelet Rx, statin.   5. Carotid stenosis, bilateral: FU with [facility].  6. S/P AAA (abdominal aortic aneurysm) repair: FU with [facility].  7. History of CVA (cerebrovascular accident): Continue antiplatelet, statin.   Current medicines are reviewed at length with the patient today.  The patient does not have concerns regarding medicines.  The following changes have been made:  no change  Labs/ tests ordered today include:  No orders of the defined types were placed in this encounter.    Disposition:   FU with me in 6 months  Signed, Lauree Chandler, MD 02/17/2015 8:56 AM    [facility] [address], [place], [place]  [address] Phone: [phone]; Fax: [phone]

## 2015-02-17 NOTE — Patient Instructions (Signed)
Medication Instructions:  Your physician recommends that you continue on your current medications as directed. Please refer to the Current Medication list given to you today.   Labwork: none  Testing/Procedures: none  Follow-Up: Your physician wants you to follow-up in: 6 months.  You will receive a reminder letter in the mail two months in advance. If you don't receive a letter, please call our office to schedule the follow-up appointment.       

## 2015-02-27 ENCOUNTER — Other Ambulatory Visit (INDEPENDENT_AMBULATORY_CARE_PROVIDER_SITE_OTHER): Payer: BLUE CROSS/BLUE SHIELD

## 2015-02-27 DIAGNOSIS — R972 Elevated prostate specific antigen [PSA]: Secondary | ICD-10-CM

## 2015-02-27 DIAGNOSIS — E1149 Type 2 diabetes mellitus with other diabetic neurological complication: Secondary | ICD-10-CM

## 2015-02-27 LAB — HEPATIC FUNCTION PANEL
ALBUMIN: 4.4 g/dL (ref 3.5–5.2)
ALT: 17 U/L (ref 0–53)
AST: 20 U/L (ref 0–37)
Alkaline Phosphatase: 30 U/L — ABNORMAL LOW (ref 39–117)
Bilirubin, Direct: 0.1 mg/dL (ref 0.0–0.3)
Total Bilirubin: 0.5 mg/dL (ref 0.2–1.2)
Total Protein: 7.6 g/dL (ref 6.0–8.3)

## 2015-02-27 LAB — LIPID PANEL
CHOLESTEROL: 126 mg/dL (ref 0–200)
HDL: 33 mg/dL — AB (ref >39.00)
LDL CALC: 55 mg/dL (ref 0–99)
NonHDL: 93.38
TRIGLYCERIDES: 192 mg/dL — AB (ref 0.0–149.0)
Total CHOL/HDL Ratio: 4
VLDL: 38.4 mg/dL (ref 0.0–40.0)

## 2015-02-27 LAB — BASIC METABOLIC PANEL
BUN: 29 mg/dL — AB (ref 6–23)
CO2: 28 mEq/L (ref 19–32)
Calcium: 10.1 mg/dL (ref 8.4–10.5)
Chloride: 101 mEq/L (ref 96–112)
Creatinine, Ser: 1.12 mg/dL (ref 0.40–1.50)
GFR: 69.63 mL/min (ref >60.00)
GLUCOSE: 130 mg/dL — AB (ref 70–99)
POTASSIUM: 4.4 meq/L (ref 3.5–5.1)
Sodium: 138 mEq/L (ref 135–145)

## 2015-02-27 LAB — HEMOGLOBIN A1C: Hgb A1c MFr Bld: 5.8 % (ref 4.6–6.5)

## 2015-02-27 LAB — PSA: PSA: 2.23 ng/mL (ref 0.10–4.00)

## 2015-03-01 ENCOUNTER — Ambulatory Visit (INDEPENDENT_AMBULATORY_CARE_PROVIDER_SITE_OTHER): Payer: BLUE CROSS/BLUE SHIELD | Admitting: Internal Medicine

## 2015-03-01 ENCOUNTER — Encounter: Payer: Self-pay | Admitting: Internal Medicine

## 2015-03-01 VITALS — BP 140/74 | HR 56 | Temp 97.9°F | Wt 258.4 lb

## 2015-03-01 DIAGNOSIS — I10 Essential (primary) hypertension: Secondary | ICD-10-CM

## 2015-03-01 DIAGNOSIS — Z0000 Encounter for general adult medical examination without abnormal findings: Secondary | ICD-10-CM | POA: Diagnosis not present

## 2015-03-01 DIAGNOSIS — E1149 Type 2 diabetes mellitus with other diabetic neurological complication: Secondary | ICD-10-CM

## 2015-03-01 MED ORDER — CLOPIDOGREL BISULFATE 75 MG PO TABS: 75.0000 mg | ORAL_TABLET | Freq: Every day | ORAL | Status: DC

## 2015-03-01 MED ORDER — PIOGLITAZONE HCL 30 MG PO TABS: 30.0000 mg | ORAL_TABLET | Freq: Every day | ORAL | Status: DC

## 2015-03-01 MED ORDER — VALSARTAN-HYDROCHLOROTHIAZIDE 320-25 MG PO TABS: 1.0000 | ORAL_TABLET | Freq: Every day | ORAL | Status: DC

## 2015-03-01 MED ORDER — ATORVASTATIN CALCIUM 40 MG PO TABS: 40.0000 mg | ORAL_TABLET | Freq: Every day | ORAL | Status: DC

## 2015-03-01 MED ORDER — METFORMIN HCL ER 500 MG PO TB24: 2000.0000 mg | ORAL_TABLET | Freq: Every day | ORAL | Status: DC

## 2015-03-01 MED ORDER — VALSARTAN-HYDROCHLOROTHIAZIDE 320-25 MG PO TABS
1.0000 | ORAL_TABLET | Freq: Every day | ORAL | Status: DC
Start: 2015-03-01 — End: 2015-03-01

## 2015-03-01 MED ORDER — FOLIC ACID 1 MG PO TABS: 1.0000 mg | ORAL_TABLET | Freq: Every day | ORAL | Status: DC

## 2015-03-01 MED ORDER — GEMFIBROZIL 600 MG PO TABS: 1200.0000 mg | ORAL_TABLET | Freq: Every day | ORAL | Status: DC

## 2015-03-01 NOTE — Assessment & Plan Note (Signed)
Ok to change the benicar HCT to diovan hct asd,  to f/u any worsening symptoms or concerns BP Readings from Last 3 Encounters:  03/01/15 140/74  02/17/15 136/82  12/08/14 118/70

## 2015-03-01 NOTE — Assessment & Plan Note (Signed)
stable overall by history and exam, recent data reviewed with pt, and pt to continue medical treatment as before,  to f/u any worsening symptoms or concerns Lab Results  Component Value Date   HGBA1C 5.8 02/27/2015

## 2015-03-01 NOTE — Assessment & Plan Note (Signed)
Overall doing well, age appropriate education and counseling updated, referrals for preventative services and immunizations addressed, dietary and smoking counseling addressed, most recent labs reviewed.  I have personally reviewed and have noted:  1) the patient's medical and social history 2) The pt's use of alcohol, tobacco, and illicit drugs 3) The patient's current medications and supplements 4) Functional ability including ADL's, fall risk, home safety risk, hearing and visual impairment 5) Diet and physical activities 6) Evidence for depression or mood disorder 7) The patient's height, weight, and BMI have been recorded in the chart  I have made referrals, and provided counseling and education based on review of the above Lab Results  Component Value Date   WBC 4.5 01/19/2015   HGB 13.8 01/19/2015   HCT 40.0 01/19/2015   PLT 210.0 01/19/2015   GLUCOSE 130* 02/27/2015   CHOL 126 02/27/2015   TRIG 192.0* 02/27/2015   HDL 33.00* 02/27/2015   LDLDIRECT 57.0 08/03/2014   LDLCALC 55 02/27/2015   ALT 17 02/27/2015   AST 20 02/27/2015   NA 138 02/27/2015   K 4.4 02/27/2015   CL 101 02/27/2015   CREATININE 1.12 02/27/2015   BUN 29* 02/27/2015   CO2 28 02/27/2015   TSH 1.59 08/03/2014   PSA 2.23 02/27/2015   INR 1.10 02/14/2009   HGBA1C 5.8 02/27/2015   MICROALBUR 57.9* 08/03/2014

## 2015-03-01 NOTE — Progress Notes (Signed)
Subjective:    Patient ID: David [name], male    DOB: [birth year], 63 y.o.   MRN: [id number]  HPI  Here for wellness and f/u;  Overall doing ok;  Pt denies Chest pain, worsening SOB, DOE, wheezing, orthopnea, PND, worsening LE edema, palpitations, dizziness or syncope.  Pt denies neurological change such as new headache, facial or extremity weakness.  Pt denies polydipsia, polyuria, or low sugar symptoms. Pt states overall good compliance with treatment and medications, good tolerability, and has been trying to follow appropriate diet.  Pt denies worsening depressive symptoms, suicidal ideation or panic. No fever, night sweats, wt loss, loss of appetite, or other constitutional symptoms.  Pt states good ability with ADL's, has low fall risk, home safety reviewed and adequate, no other significant changes in hearing or vision, and only occasionally active with exercise. Sees vascular in f/u q 6 mo with exam.  Wt Readings from Last 3 Encounters:  03/01/15 258 lb 6.4 oz (117.209 kg)  02/17/15 259 lb 12.8 oz (117.845 kg)  12/08/14 250 lb 12.8 oz (113.762 kg)  new health insurance this yr will not cover benicarHCT.  Also sees Dr mcAlhany/card with recent afib, now on eliquis, also to stay on plavix per pt per card, stopped asa.  Past Medical History  Diagnosis Date  . Diverticulosis   . Hemorrhoids   . Hx of adenomatous colonic polyps   . Hyperlipidemia   . Hypertension   . CVA (cerebral vascular accident) ([place]) 2003  . Anticardiolipin antibody positive   . Perineal abscess   . Diabetes mellitus     type II  . GERD (gastroesophageal reflux disease)   . Peripheral vascular disease ([place])     peripheral vascular disease/carotid artery <100%  . Anxiety   . Depression   . 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 CEREBROVASCULAR ACCIDENT, HX OF 07/19/2008  . ANXIETY 07/19/2008  . DEPRESSION 07/19/2008  . DIABETES MELLITUS, TYPE II 01/18/2008  . GERD 07/19/2008  . HYPERLIPIDEMIA  07/19/2008  . HYPERTENSION 07/31/2009  . Increased prostate specific antigen (PSA) velocity 09/13/2010  . Unspecified Peripheral Vascular Disease 01/18/2008  . PERSONAL HX COLONIC POLYPS 12/10/2007  . AAA (abdominal aortic aneurysm) ([place])   . Varicose veins   . Internal hemorrhoids    Past Surgical History  Procedure Laterality Date  . Hernia surgury      64 years old  . Perineal abscess    . Sp endo repair infraren aaa    . S/p right and left popliteal aneurysm repair    . 7 abdominal hernia repairs  05/31/10  . Colonoscopy    . Polypectomy    . Abdominal aortic aneurysm repair    . Abdominal aortagram N/A 01/12/2013    Procedure: ABDOMINAL Maxcine Ham;  Surgeon: Serafina Mitchell, MD;  Location: [facility] CATH LAB;  Service: Cardiovascular;  Laterality: N/A;  . Peripheral vascular catheterization N/A 11/23/2014    Procedure: Abdominal Aortogram;  Surgeon: Serafina Mitchell, MD;  Location: [facility] CV LAB;  Service: Cardiovascular;  Laterality: N/A;  . Peripheral vascular catheterization  11/23/2014    Procedure: Lower Extremity Angiography;  Surgeon: Serafina Mitchell, MD;  Location: [facility];  Service: Cardiovascular;;  . Peripheral vascular catheterization  11/23/2014    Procedure: Peripheral Vascular Intervention;  Surgeon: Serafina Mitchell, MD;  Location: [facility] LAB;  Service: Cardiovascular;;  PTA bilateral common iliac PTA/DCB left fem-pop    reports that he quit  smoking about 23 years ago. His smoking use included Cigarettes. He has never used smokeless tobacco. He reports that he drinks about 1.2 oz of alcohol per week. He reports that he does not use illicit drugs. family history includes Aortic aneurysm in his mother; Cancer in his brother. There is no history of Colon cancer, Esophageal cancer, Stomach cancer, or Rectal cancer. Allergies  Allergen Reactions  . Latex Rash   Current Outpatient Prescriptions on File Prior to Visit  Medication Sig Dispense Refill  . apixaban  (ELIQUIS) 5 MG TABS tablet Take 1 tablet (5 mg total) by mouth 2 (two) times daily. 60 tablet 0  . ascorbic Acid (VITAMIN C CR) 500 MG CPCR Take 500 mg by mouth daily.     . cetirizine (ZYRTEC) 10 MG tablet Take 1 tablet (10 mg total) by mouth daily. 90 tablet 3  . docusate sodium (COLACE) 100 MG capsule Take 100 mg by mouth 2 (two) times daily.     . Fiber CAPS Take 5.7 mg by mouth See admin instructions. Take (10) 0.57 capsules by mouth daily for regularity.    . mesalamine (CANASA) 1000 MG suppository Place 1,000 mg rectally daily as needed (hemmorroids).    . metroNIDAZOLE (METROGEL) 0.75 % gel Apply 1 application topically 2 (two) times daily.     . Multiple Vitamin (MULTIVITAMIN) capsule Take 1 capsule by mouth daily.     . Omega-3 Fatty Acids (FISH OIL PO) Take 2 capsules by mouth daily.     . ranitidine (ZANTAC) 150 MG tablet Take 1 tablet (150 mg total) by mouth 2 (two) times daily. 180 tablet 3   No current facility-administered medications on file prior to visit.    Review of Systems Constitutional: Negative for increased diaphoresis, other activity, appetite or siginficant weight change other than noted HENT: Negative for worsening hearing loss, ear pain, facial swelling, mouth sores and neck stiffness.   Eyes: Negative for other worsening pain, redness or visual disturbance.  Respiratory: Negative for shortness of breath and wheezing  Cardiovascular: Negative for chest pain and palpitations.  Gastrointestinal: Negative for diarrhea, blood in stool, abdominal distention or other pain Genitourinary: Negative for hematuria, flank pain or change in urine volume.  Musculoskeletal: Negative for myalgias or other joint complaints.  Skin: Negative for color change and wound or drainage.  Neurological: Negative for syncope and numbness. other than noted Hematological: Negative for adenopathy. or other swelling Psychiatric/Behavioral: Negative for hallucinations, SI, self-injury,  decreased concentration or other worsening agitation.      Objective:   Physical Exam BP 140/74 mmHg  Pulse 56  Temp(Src) 97.9 F (36.6 C) (Oral)  Wt 258 lb 6.4 oz (117.209 kg)  SpO2 98% VS noted,  Constitutional: Pt is oriented to person, place, and time. Appears well-developed and well-nourished, in no significant distress Head: Normocephalic and atraumatic.  Right Ear: External ear normal.  Left Ear: External ear normal.  Nose: Nose normal.  Mouth/Throat: Oropharynx is clear and moist.  Eyes: Conjunctivae and EOM are normal. Pupils are equal, round, and reactive to light.  Neck: Normal range of motion. Neck supple. No JVD present. No tracheal deviation present or significant neck LA or mass Cardiovascular: Normal rate, regular rhythm, normal heart sounds and intact distal pulses.   Pulmonary/Chest: Effort normal and breath sounds without rales or wheezing  Abdominal: Soft. Bowel sounds are normal. NT. No HSM  Musculoskeletal: Normal range of motion. Exhibits no edema.  Lymphadenopathy:  Has no cervical adenopathy.  Neurological: Pt is  alert and oriented to person, place, and time. Pt has normal reflexes. No cranial nerve deficit. Motor grossly intact, has some hearing loss Skin: Skin is warm and dry. No rash noted.  Psychiatric:  Has normal mood and affect. Behavior is normal.      Assessment & Plan:

## 2015-03-01 NOTE — Patient Instructions (Addendum)
Please continue all other medications as before, and refills have been done if requested.  Please have the pharmacy call with any other refills you may need.  Please continue your efforts at being more active, low cholesterol diet, and weight control.  You are otherwise up to date with prevention measures today.  Please keep your appointments with your specialists as you may have planned  Please return in 6 months, or sooner if needed, with Lab testing done 3-5 days before  

## 2015-03-01 NOTE — Progress Notes (Signed)
Pre visit review using our clinic review tool, if applicable. No additional management support is needed unless otherwise documented below in the visit note. 

## 2015-03-06 ENCOUNTER — Encounter: Payer: Self-pay | Admitting: Internal Medicine

## 2015-03-07 ENCOUNTER — Other Ambulatory Visit: Payer: Self-pay | Admitting: *Deleted

## 2015-03-07 ENCOUNTER — Telehealth: Payer: Self-pay

## 2015-03-07 DIAGNOSIS — I771 Stricture of artery: Secondary | ICD-10-CM

## 2015-03-07 DIAGNOSIS — Z48812 Encounter for surgical aftercare following surgery on the circulatory system: Secondary | ICD-10-CM

## 2015-03-07 NOTE — Telephone Encounter (Signed)
[facility] stating that there is an interaction with Actos, Plavix, and Lopid, specifically that Lopid and Plavix can increase effects of Actos. Please advise if any alternatives are necessary

## 2015-03-07 NOTE — Telephone Encounter (Signed)
Also added that if it is ok, then PA is required through [employer]

## 2015-03-08 ENCOUNTER — Telehealth: Payer: Self-pay | Admitting: *Deleted

## 2015-03-08 MED ORDER — FOLIC ACID 1 MG PO TABS: 1.0000 mg | ORAL_TABLET | Freq: Every day | ORAL | Status: DC

## 2015-03-08 NOTE — Telephone Encounter (Signed)
Left msg on triage stating needing [facility] on his [facility] Acid, also he need to get a PA on the Actos. Called pt inform the rx for folic acid [facility], and they have contacted md yesterday concering the PA on the actos. Inform him once the assistant start the PA process and get a decision back from insurance they will contact him with status...David [name] .David [name]

## 2015-03-08 NOTE — Telephone Encounter (Signed)
Dahlia to note pt concerns about Prior Auth.

## 2015-03-08 NOTE — Telephone Encounter (Signed)
No, ok to cont at this time, thanks

## 2015-03-09 ENCOUNTER — Encounter: Payer: Self-pay | Admitting: Surgery

## 2015-03-09 NOTE — Telephone Encounter (Signed)
PA for both started today.

## 2015-03-11 NOTE — Telephone Encounter (Signed)
PA has been approved for the ACTOS.   Pt informed and pharmacy is aware.

## 2015-03-13 ENCOUNTER — Ambulatory Visit (INDEPENDENT_AMBULATORY_CARE_PROVIDER_SITE_OTHER)
Admit: 2015-03-13 | Discharge: 2015-03-13 | Disposition: A | Discharge status: Home / Self Care | Payer: BLUE CROSS/BLUE SHIELD

## 2015-03-13 ENCOUNTER — Ambulatory Visit (HOSPITAL_COMMUNITY)
Admit: 2015-03-13 | Discharge: 2015-03-13 | Disposition: A | Discharge status: Home / Self Care | Payer: BLUE CROSS/BLUE SHIELD | Source: Ambulatory Visit | Attending: Surgery | Admitting: Surgery

## 2015-03-13 ENCOUNTER — Ambulatory Visit (INDEPENDENT_AMBULATORY_CARE_PROVIDER_SITE_OTHER): Payer: BLUE CROSS/BLUE SHIELD | Admitting: Surgery

## 2015-03-13 ENCOUNTER — Encounter: Payer: Self-pay | Admitting: Surgery

## 2015-03-13 ENCOUNTER — Ambulatory Visit (INDEPENDENT_AMBULATORY_CARE_PROVIDER_SITE_OTHER)
Admit: 2015-03-13 | Discharge: 2015-03-13 | Disposition: A | Discharge status: Home / Self Care | Payer: BLUE CROSS/BLUE SHIELD | Attending: Surgery | Admitting: Surgery

## 2015-03-13 VITALS — BP 143/78 | HR 54 | Temp 97.5°F | Resp 14 | Ht 70.0 in | Wt 258.0 lb

## 2015-03-13 DIAGNOSIS — I739 Peripheral vascular disease, unspecified: Secondary | ICD-10-CM | POA: Diagnosis not present

## 2015-03-13 DIAGNOSIS — Z48812 Encounter for surgical aftercare following surgery on the circulatory system: Secondary | ICD-10-CM | POA: Diagnosis not present

## 2015-03-13 DIAGNOSIS — Z9862 Peripheral vascular angioplasty status: Secondary | ICD-10-CM

## 2015-03-13 DIAGNOSIS — I771 Stricture of artery: Secondary | ICD-10-CM | POA: Diagnosis not present

## 2015-03-13 NOTE — Progress Notes (Signed)
Filed Vitals:   03/13/15 1101 03/13/15 1107  BP: 149/65 143/78  Pulse: 52 54  Temp: 97.5 F (36.4 C)   TempSrc: Oral   Resp: 14   Height: 5\' 10"  (1.778 m)   Weight: 258 lb (117.028 kg)   SpO2: 100%

## 2015-03-13 NOTE — Progress Notes (Signed)
Patient name: David [name] MRN: [id number] DOB: 1950-12-07 Sex: male     Chief Complaint  Patient presents with  . Re-evaluation    3 mo  PAD  F/U     S/P  PV Cath. 11-23-14      HISTORY OF PRESENT ILLNESS: The patient comes back today for followup. He has undergone open abdominal aortic aneurysm repair by Dr. Amedeo Plenty in 2012. 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 He had his left popliteal aneurysm repaired by above-knee to below-knee bypass in October of 2010 he's had a midline abdominal hernia repair by Dr. gross. He had elevated velocities in the proximal anastomosis of his bypass graft and underwent arteriogram which revealed a high-grade stenosis extending into his bypass graft balloon angioplasty was performed however this resulted in a dissection and he therefore underwent stenting using a 6 mm stent. He was found to have in-stent stenosis on the left. On 01/12/2013 he underwent angiography with a successful balloon angioplasty of the stent using a 6 mm balloon.  The patient most recently developed monophasic waveforms and a drop in his ABI on the left.  Therefore on 11/23/2014 he underwent drug coated balloon angioplasty of the left proximal anastomosis which has previously been stented.  He also underwent balloon angioplasty of a left common iliac stenosis and angioplasty of the right common iliac stenosis.  He remains asymptomatic.  Past Medical History  Diagnosis Date  . Diverticulosis   . Hemorrhoids   . Hx of adenomatous colonic polyps   . Hyperlipidemia   . Hypertension   . CVA (cerebral vascular accident) ([place]) 2003  . Anticardiolipin antibody positive   . Perineal abscess   . Diabetes mellitus     type II  . GERD (gastroesophageal reflux disease)   . Peripheral vascular disease ([place])     peripheral vascular disease/carotid artery <100%  . Anxiety   . Depression   . 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 CEREBROVASCULAR  ACCIDENT, HX OF 07/19/2008  . ANXIETY 07/19/2008  . DEPRESSION 07/19/2008  . DIABETES MELLITUS, TYPE II 01/18/2008  . GERD 07/19/2008  . HYPERLIPIDEMIA 07/19/2008  . HYPERTENSION 07/31/2009  . Increased prostate specific antigen (PSA) velocity 09/13/2010  . Unspecified Peripheral Vascular Disease 01/18/2008  . PERSONAL HX COLONIC POLYPS 12/10/2007  . AAA (abdominal aortic aneurysm) ([place])   . Varicose veins   . Internal hemorrhoids     Past Surgical History  Procedure Laterality Date  . Hernia surgury      64 years old  . Perineal abscess    . Sp endo repair infraren aaa    . S/p right and left popliteal aneurysm repair    . 7 abdominal hernia repairs  05/31/10  . Colonoscopy    . Polypectomy    . Abdominal aortic aneurysm repair    . Abdominal aortagram N/A 01/12/2013    Procedure: ABDOMINAL Maxcine Ham;  Surgeon: Serafina Mitchell, MD;  Location: [facility] CATH LAB;  Service: Cardiovascular;  Laterality: N/A;  . Peripheral vascular catheterization N/A 11/23/2014    Procedure: Abdominal Aortogram;  Surgeon: Serafina Mitchell, MD;  Location: [facility] LAB;  Service: Cardiovascular;  Laterality: N/A;  . Peripheral vascular catheterization  11/23/2014    Procedure: Lower Extremity Angiography;  Surgeon: Serafina Mitchell, MD;  Location: [facility];  Service: Cardiovascular;;  . Peripheral vascular catheterization  11/23/2014    Procedure: Peripheral Vascular Intervention;  Surgeon: Durene Fruits  Pierre Bali, MD;  Location: [facility];  Service: Cardiovascular;;  PTA bilateral common iliac PTA/DCB left fem-pop    Social History   Social History  . Marital Status: Married    Spouse Name: N/A  . Number of Children: 1  . Years of Education: N/A   Occupational History  . dsiabled due to stroke    Social History Main Topics  . Smoking status: Former Smoker    Types: Cigarettes    Quit date: 01/14/1992  . Smokeless tobacco: Never Used  . Alcohol Use: 1.2 oz/week    2 Glasses of wine per week      Comment: wine daily  . Drug Use: No  . Sexual Activity: Not on file   Other Topics Concern  . Not on file   Social History Narrative   2 cups of coffee in the morning and tea throughout the day.    Family History  Problem Relation Age of Onset  . Aortic aneurysm Mother     desceding aoritic aneurysm  . Cancer Brother     Lung cancer  . Colon cancer Neg Hx   . Esophageal cancer Neg Hx   . Stomach cancer Neg Hx   . Rectal cancer Neg Hx     Allergies as of 03/13/2015 - Review Complete 03/13/2015  Allergen Reaction Noted  . Latex Rash 08/03/2010    Current Outpatient Prescriptions on File Prior to Visit  Medication Sig Dispense Refill  . apixaban (ELIQUIS) 5 MG TABS tablet Take 1 tablet (5 mg total) by mouth 2 (two) times daily. 60 tablet 0  . ascorbic Acid (VITAMIN C CR) 500 MG CPCR Take 500 mg by mouth daily.     Marland Kitchen atorvastatin (LIPITOR) 40 MG tablet Take 1 tablet (40 mg total) by mouth daily. 90 tablet 3  . cetirizine (ZYRTEC) 10 MG tablet Take 1 tablet (10 mg total) by mouth daily. 90 tablet 3  . clopidogrel (PLAVIX) 75 MG tablet Take 1 tablet (75 mg total) by mouth daily. 90 tablet 3  . docusate sodium (COLACE) 100 MG capsule Take 100 mg by mouth 2 (two) times daily.     . Fiber CAPS Take 5.7 mg by mouth See admin instructions. Take (10) 0.57 capsules by mouth daily for regularity.    . folic acid (FOLVITE) 1 MG tablet Take 1 tablet (1 mg total) by mouth daily. 90 tablet 3  . gemfibrozil (LOPID) 600 MG tablet Take 2 tablets (1,200 mg total) by mouth daily. 180 tablet 3  . mesalamine (CANASA) 1000 MG suppository Place 1,000 mg rectally daily as needed (hemmorroids).    . metFORMIN (GLUCOPHAGE-XR) 500 MG 24 hr tablet Take 4 tablets (2,000 mg total) by mouth daily. 4 tabs by mouth in the AM 360 tablet 3  . metroNIDAZOLE (METROGEL) 0.75 % gel Apply 1 application topically 2 (two) times daily.     . Multiple Vitamin (MULTIVITAMIN) capsule Take 1 capsule by mouth daily.     .  Omega-3 Fatty Acids (FISH OIL PO) Take 2 capsules by mouth daily.     . pioglitazone (ACTOS) 30 MG tablet Take 1 tablet (30 mg total) by mouth daily. 90 tablet 3  . ranitidine (ZANTAC) 150 MG tablet Take 1 tablet (150 mg total) by mouth 2 (two) times daily. 180 tablet 3  . valsartan-hydrochlorothiazide (DIOVAN HCT) 320-25 MG tablet Take 1 tablet by mouth daily. 90 tablet 3   No current facility-administered medications on file prior to visit.  REVIEW OF SYSTEMS: Cardiovascular: No chest pain, chest pressure, palpitations, orthopnea, or dyspnea on exertion. No claudication or rest pain,  No history of DVT or phlebitis. Pulmonary: No productive cough, asthma or wheezing. Neurologic: No weakness, paresthesias, aphasia, or amaurosis. No dizziness. Hematologic: No bleeding problems or clotting disorders. Musculoskeletal: No joint pain or joint swelling. Gastrointestinal: No blood in stool or hematemesis Genitourinary: No dysuria or hematuria. Psychiatric:: No history of major depression. Integumentary: No rashes or ulcers. Constitutional: No fever or chills.  PHYSICAL EXAMINATION:   Vital signs are  Filed Vitals:   03/13/15 1101 03/13/15 1107  BP: 149/65 143/78  Pulse: 52 54  Temp: 97.5 F (36.4 C)   TempSrc: Oral   Resp: 14   Height: 5\' 10"  (1.778 m)   Weight: 258 lb (117.028 kg)   SpO2: 100%    Body mass index is 37.02 kg/(m^2). General: The patient appears their stated age. HEENT:  No gross abnormalities Pulmonary:  Non labored breathing Musculoskeletal: There are no major deformities. Neurologic: No focal weakness or paresthesias are detected, Skin: There are no ulcer or rashes noted. Psychiatric: The patient has normal affect. Cardiovascular: There is a regular rate and rhythm without significant murmur appreciated.   Diagnostic Studies I have ordered and reviewed his duplex.  This was a technically difficult study secondary to bowel gas.  The iliac arteries were not  well visualized there were velocity elevations in the common iliac artery, bilaterally, however the external iliac arteries had triphasic signals.  Ankle-brachial index on the left was 0.9 on the right is 0.95.  All waveforms are triphasic  Assessment: Peripheral vascular disease Plan: Patient has had a successful result from his most recent intervention.  I'm going to have him follow-up in June 2017.  At that time we will evaluate ankle brachial indices, bilateral lower extremities, his aortoiliac section as well as his carotid arteries.  Meantime, he will continue with his antiplatelet regimen  V. Leia Alf, M.D. [facility] Office: [phone] Pager:  [phone]

## 2015-03-13 NOTE — Addendum Note (Signed)
Addended by: Dorthula Rue L on: 03/13/2015 05:32 PM   Modules accepted: Orders

## 2015-03-15 ENCOUNTER — Telehealth: Payer: Self-pay | Admitting: Internal Medicine

## 2015-03-15 NOTE — Telephone Encounter (Signed)
Patient states there was a mix up on 08/04/2014 and he received a no show fee.  He is requesting this to be refunded.  States he spoke with [facility] about refunding it and he states he was told they would back in March?

## 2015-03-20 ENCOUNTER — Telehealth: Payer: Self-pay | Admitting: Internal Medicine

## 2015-03-20 NOTE — Telephone Encounter (Signed)
Pt states the [facility] has been trying to get a hold of [place] regarding his medication of pioglitazone (ACTOS) 30 MG tablet [id number] They say there is a potential drug interaction with another one of his medications. He has been taking all these medications together for over two years. Can you please call pharmacy to get this straightened out  Pt states he only has about 10 pills left and if you can give him a call at [phone] to let him know its been taken care of

## 2015-03-23 NOTE — Telephone Encounter (Signed)
Can you please give him a call regarding the encounter below when it is taken care of

## 2015-03-24 NOTE — Telephone Encounter (Signed)
Pt advised to contact pharmacy and request that they fax interaction information to the office for PCP review. Pt agreed

## 2015-03-28 NOTE — Telephone Encounter (Signed)
Pt called back in and is requesting to know the status of this med?

## 2015-03-28 NOTE — Telephone Encounter (Signed)
Please advise pt that we have yet to receive any communication from his pharmacy regarding medication interaction

## 2015-03-29 ENCOUNTER — Encounter: Payer: Self-pay | Admitting: Internal Medicine

## 2015-03-29 MED ORDER — PIOGLITAZONE HCL 30 MG PO TABS: 30.0000 mg | ORAL_TABLET | Freq: Every day | ORAL | Status: DC

## 2015-06-20 ENCOUNTER — Other Ambulatory Visit: Payer: Self-pay | Admitting: General Practice

## 2015-06-23 ENCOUNTER — Encounter: Payer: Self-pay | Admitting: Internal Medicine

## 2015-06-23 NOTE — Telephone Encounter (Signed)
PA initiated via [place]

## 2015-08-28 ENCOUNTER — Telehealth: Payer: Self-pay | Admitting: Internal Medicine

## 2015-08-28 NOTE — Telephone Encounter (Signed)
Patient moved 6 month fu to May 11th.  Please make sure labs are still good for patient to come in before to have done.

## 2015-08-30 ENCOUNTER — Ambulatory Visit: Payer: BLUE CROSS/BLUE SHIELD | Admitting: Internal Medicine

## 2015-09-12 ENCOUNTER — Other Ambulatory Visit (INDEPENDENT_AMBULATORY_CARE_PROVIDER_SITE_OTHER): Payer: BLUE CROSS/BLUE SHIELD

## 2015-09-12 DIAGNOSIS — E1149 Type 2 diabetes mellitus with other diabetic neurological complication: Secondary | ICD-10-CM | POA: Diagnosis not present

## 2015-09-12 DIAGNOSIS — Z0000 Encounter for general adult medical examination without abnormal findings: Secondary | ICD-10-CM

## 2015-09-12 LAB — BASIC METABOLIC PANEL
BUN: 20 mg/dL (ref 6–23)
CALCIUM: 10.1 mg/dL (ref 8.4–10.5)
CO2: 30 mEq/L (ref 19–32)
Chloride: 97 mEq/L (ref 96–112)
Creatinine, Ser: 1.04 mg/dL (ref 0.40–1.50)
GFR: 75.72 mL/min (ref >60.00)
Glucose, Bld: 149 mg/dL — ABNORMAL HIGH (ref 70–99)
POTASSIUM: 4.3 meq/L (ref 3.5–5.1)
SODIUM: 135 meq/L (ref 135–145)

## 2015-09-12 LAB — LIPID PANEL
CHOL/HDL RATIO: 3
Cholesterol: 114 mg/dL (ref 0–200)
HDL: 35.6 mg/dL — ABNORMAL LOW (ref >39.00)
NonHDL: 78.02
Triglycerides: 217 mg/dL — ABNORMAL HIGH (ref 0.0–149.0)
VLDL: 43.4 mg/dL — AB (ref 0.0–40.0)

## 2015-09-12 LAB — HEPATIC FUNCTION PANEL
ALBUMIN: 4.5 g/dL (ref 3.5–5.2)
ALK PHOS: 25 U/L — AB (ref 39–117)
ALT: 28 U/L (ref 0–53)
AST: 34 U/L (ref 0–37)
Bilirubin, Direct: 0.2 mg/dL (ref 0.0–0.3)
TOTAL PROTEIN: 7.9 g/dL (ref 6.0–8.3)
Total Bilirubin: 0.7 mg/dL (ref 0.2–1.2)

## 2015-09-12 LAB — LDL CHOLESTEROL, DIRECT: LDL DIRECT: 40 mg/dL

## 2015-09-12 LAB — HEMOGLOBIN A1C: Hgb A1c MFr Bld: 6.1 % (ref 4.6–6.5)

## 2015-09-13 LAB — HEPATITIS C ANTIBODY: HCV AB: NEGATIVE

## 2015-09-14 ENCOUNTER — Ambulatory Visit (INDEPENDENT_AMBULATORY_CARE_PROVIDER_SITE_OTHER): Payer: BLUE CROSS/BLUE SHIELD | Admitting: Internal Medicine

## 2015-09-14 ENCOUNTER — Telehealth: Payer: Self-pay

## 2015-09-14 ENCOUNTER — Encounter: Payer: Self-pay | Admitting: Internal Medicine

## 2015-09-14 VITALS — BP 136/80 | HR 89 | Temp 97.9°F | Resp 20 | Wt 264.0 lb

## 2015-09-14 DIAGNOSIS — E785 Hyperlipidemia, unspecified: Secondary | ICD-10-CM | POA: Diagnosis not present

## 2015-09-14 DIAGNOSIS — Z0001 Encounter for general adult medical examination with abnormal findings: Secondary | ICD-10-CM

## 2015-09-14 DIAGNOSIS — E1149 Type 2 diabetes mellitus with other diabetic neurological complication: Secondary | ICD-10-CM | POA: Diagnosis not present

## 2015-09-14 DIAGNOSIS — I10 Essential (primary) hypertension: Secondary | ICD-10-CM | POA: Diagnosis not present

## 2015-09-14 DIAGNOSIS — R6889 Other general symptoms and signs: Secondary | ICD-10-CM

## 2015-09-14 MED ORDER — PIOGLITAZONE HCL 30 MG PO TABS: 30.0000 mg | ORAL_TABLET | Freq: Every day | ORAL | Status: DC

## 2015-09-14 MED ORDER — CLOPIDOGREL BISULFATE 75 MG PO TABS: 75.0000 mg | ORAL_TABLET | Freq: Every day | ORAL | Status: DC

## 2015-09-14 MED ORDER — METFORMIN HCL ER 500 MG PO TB24: 2000.0000 mg | ORAL_TABLET | Freq: Every day | ORAL | Status: DC

## 2015-09-14 MED ORDER — GEMFIBROZIL 600 MG PO TABS: 1200.0000 mg | ORAL_TABLET | Freq: Every day | ORAL | Status: DC

## 2015-09-14 MED ORDER — FOLIC ACID 1 MG PO TABS: 1.0000 mg | ORAL_TABLET | Freq: Every day | ORAL | Status: DC

## 2015-09-14 MED ORDER — VALSARTAN-HYDROCHLOROTHIAZIDE 320-25 MG PO TABS: 1.0000 | ORAL_TABLET | Freq: Every day | ORAL | Status: DC

## 2015-09-14 MED ORDER — ATORVASTATIN CALCIUM 40 MG PO TABS: 40.0000 mg | ORAL_TABLET | Freq: Every day | ORAL | Status: DC

## 2015-09-14 NOTE — Patient Instructions (Signed)
Please continue all other medications as before, and refills have been done if requested.  Please have the pharmacy call with any other refills you may need.  Please continue your efforts at being more active, low cholesterol diet, and weight control.  You are otherwise up to date with prevention measures today.  Please keep your appointments with your specialists as you may have planned  Please return in 6 months, or sooner if needed, with Lab testing done 3-5 days before  

## 2015-09-14 NOTE — Assessment & Plan Note (Signed)
stable overall by history and exam, recent data reviewed with pt, and pt to continue medical treatment as before,  to f/u any worsening symptoms or concerns Lab Results  Component Value Date   HGBA1C 6.1 09/12/2015    

## 2015-09-14 NOTE — Progress Notes (Signed)
Pre visit review using our clinic review tool, if applicable. No additional management support is needed unless otherwise documented below in the visit note. 

## 2015-09-14 NOTE — Assessment & Plan Note (Signed)
stable overall by history and exam, recent data reviewed with pt, and pt to continue medical treatment as before,  to f/u any worsening symptoms or concerns Lab Results  Component Value Date   LDLCALC 55 02/27/2015

## 2015-09-14 NOTE — Telephone Encounter (Signed)
[facility]

## 2015-09-14 NOTE — Progress Notes (Signed)
Subjective:    Patient ID: David [name], male    DOB: Jul 16, 1950, 65 y.o.   MRN: [id number]  HPI  Here to f/u; overall doing ok,  Pt denies chest pain, increasing sob or doe, wheezing, orthopnea, PND, increased LE swelling, palpitations, dizziness or syncope.  Pt denies new neurological symptoms such as new headache, or facial or extremity weakness or numbness.  Pt denies polydipsia, polyuria, or low sugar episode.   Pt denies new neurological symptoms such as new headache, or facial or extremity weakness or numbness.   Pt states overall good compliance with meds, mostly trying to follow appropriate diet, with wt overall stable,  but little exercise however.' Wt Readings from Last 3 Encounters:  09/14/15 264 lb (119.75 kg)  03/13/15 258 lb (117.028 kg)  03/01/15 258 lb 6.4 oz (117.209 kg)  Remains on plavix for PAD, and eliquis for afib prevention.  No overt bleeding.  No sleep apnea symtpoms.  Overall good compliance with treatment, and good medicine tolerability. Past Medical History  Diagnosis Date  . Diverticulosis   . Hemorrhoids   . Hx of adenomatous colonic polyps   . Hyperlipidemia   . Hypertension   . CVA (cerebral vascular accident) ([place]) 2003  . Anticardiolipin antibody positive   . Perineal abscess   . Diabetes mellitus     type II  . GERD (gastroesophageal reflux disease)   . Peripheral vascular disease ([place])     peripheral vascular disease/carotid artery <100%  . Anxiety   . Depression   . 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 CEREBROVASCULAR ACCIDENT, HX OF 07/19/2008  . ANXIETY 07/19/2008  . DEPRESSION 07/19/2008  . DIABETES MELLITUS, TYPE II 01/18/2008  . GERD 07/19/2008  . HYPERLIPIDEMIA 07/19/2008  . HYPERTENSION 07/31/2009  . Increased prostate specific antigen (PSA) velocity 09/13/2010  . Unspecified Peripheral Vascular Disease 01/18/2008  . PERSONAL HX COLONIC POLYPS 12/10/2007  . AAA (abdominal aortic aneurysm) ([place])   . Varicose veins   .  Internal hemorrhoids    Past Surgical History  Procedure Laterality Date  . Hernia surgury      65 years old  . Perineal abscess    . Sp endo repair infraren aaa    . S/p right and left popliteal aneurysm repair    . 7 abdominal hernia repairs  05/31/10  . Colonoscopy    . Polypectomy    . Abdominal aortic aneurysm repair    . Abdominal aortagram N/A 01/12/2013    Procedure: ABDOMINAL Maxcine Ham;  Surgeon: Serafina Mitchell, MD;  Location: [facility] CATH LAB;  Service: Cardiovascular;  Laterality: N/A;  . Peripheral vascular catheterization N/A 11/23/2014    Procedure: Abdominal Aortogram;  Surgeon: Serafina Mitchell, MD;  Location: [place] CV LAB;  Service: Cardiovascular;  Laterality: N/A;  . Peripheral vascular catheterization  11/23/2014    Procedure: Lower Extremity Angiography;  Surgeon: Serafina Mitchell, MD;  Location: [place] CV LAB;  Service: Cardiovascular;;  . Peripheral vascular catheterization  11/23/2014    Procedure: Peripheral Vascular Intervention;  Surgeon: Serafina Mitchell, MD;  Location: [place] CV LAB;  Service: Cardiovascular;;  PTA bilateral common iliac PTA/DCB left fem-pop    reports that he quit smoking about 23 years ago. His smoking use included Cigarettes. He has never used smokeless tobacco. He reports that he drinks about 1.2 oz of alcohol per week. He reports that he does not use illicit drugs. family history includes Aortic aneurysm in his mother;  Cancer in his brother. There is no history of Colon cancer, Esophageal cancer, Stomach cancer, or Rectal cancer. Allergies  Allergen Reactions  . Latex Rash   Current Outpatient Prescriptions on File Prior to Visit  Medication Sig Dispense Refill  . apixaban (ELIQUIS) 5 MG TABS tablet Take 1 tablet (5 mg total) by mouth 2 (two) times daily. 60 tablet 0  . ascorbic Acid (VITAMIN C CR) 500 MG CPCR Take 500 mg by mouth daily.     . cetirizine (ZYRTEC) 10 MG tablet Take 1 tablet (10 mg total) by mouth daily. 90 tablet 3    . docusate sodium (COLACE) 100 MG capsule Take 100 mg by mouth 2 (two) times daily.     . Fiber CAPS Take 5.7 mg by mouth See admin instructions. Take (10) 0.57 capsules by mouth daily for regularity.    . mesalamine (CANASA) 1000 MG suppository Place 1,000 mg rectally daily as needed (hemmorroids).    . metroNIDAZOLE (METROGEL) 0.75 % gel Apply 1 application topically 2 (two) times daily.     . Multiple Vitamin (MULTIVITAMIN) capsule Take 1 capsule by mouth daily.     . Omega-3 Fatty Acids (FISH OIL PO) Take 2 capsules by mouth daily.     . ranitidine (ZANTAC) 150 MG tablet Take 1 tablet (150 mg total) by mouth 2 (two) times daily. 180 tablet 3   No current facility-administered medications on file prior to visit.   Review of Systems  Constitutional: Negative for unusual diaphoresis or night sweats HENT: Negative for ear swelling or discharge Eyes: Negative for worsening visual haziness  Respiratory: Negative for choking and stridor.   Gastrointestinal: Negative for distension or worsening eructation Genitourinary: Negative for retention or change in urine volume.  Musculoskeletal: Negative for other MSK pain or swelling Skin: Negative for color change and worsening wound Neurological: Negative for tremors and numbness other than noted  Psychiatric/Behavioral: Negative for decreased concentration or agitation other than above       Objective:   Physical Exam BP 136/80 mmHg  Pulse 89  Temp(Src) 97.9 F (36.6 C) (Oral)  Resp 20  Wt 264 lb (119.75 kg)  SpO2 96% VS noted,  Constitutional: Pt appears in no apparent distress HENT: Head: NCAT.  Right Ear: External ear normal.  Left Ear: External ear normal.  Eyes: . Pupils are equal, round, and reactive to light. Conjunctivae and EOM are normal Neck: Normal range of motion. Neck supple.  Cardiovascular: Normal rate and regular rhythm.   Pulmonary/Chest: Effort normal and breath sounds without rales or wheezing.  Abd:  Soft, NT,  ND, + BS Neurological: Pt is alert. Not confused , motor grossly intact Skin: Skin is warm. No rash, trace bilat ankle LE edema Psychiatric: Pt behavior is normal. No agitation.     Assessment & Plan:

## 2015-09-14 NOTE — Assessment & Plan Note (Signed)
stable overall by history and exam, recent data reviewed with pt, and pt to continue medical treatment as before,  to f/u any worsening symptoms or concerns BP Readings from Last 3 Encounters:  09/14/15 136/80  03/13/15 143/78  03/01/15 140/74

## 2015-10-19 ENCOUNTER — Other Ambulatory Visit: Payer: Self-pay | Admitting: *Deleted

## 2015-10-19 DIAGNOSIS — I739 Peripheral vascular disease, unspecified: Secondary | ICD-10-CM

## 2015-10-23 ENCOUNTER — Encounter: Payer: Self-pay | Admitting: Surgery

## 2015-10-25 NOTE — Progress Notes (Signed)
Chief Complaint  Patient presents with  . Follow-up     History of Present Illness: 65 yo male with h/o HTN, HLD, DM, atrial fibrillation, PAD, AAA status post open repair 2012, remote CVA, GERD here today for cardiac follow up. His PAD is followed by Dr. Trula Slade. Prior to his most recent lower extremity procedure in July 2016, he was noted to be in atrial fibrillation. Heart rate was controlled without AV nodal blocking agents. He was seen in consultation by Dr. Martinique.  Eliquis was added with plans to remain on Eliquis, ASA, Plavix x 1 month. He is now on Plavix and Eliquis. We have pursued a rate control strategy. Echo was obtained and demonstrated EF 55-60%, BAE, mildly dilated ascending aorta. His wife is also my patient. We discussed stress testing at his last visit October 2016 but he declined.   He is here today for follow up. He is feeling well. No chest pain or SOB. No dizziness, palpitations.   Primary Care Physician: Cathlean Cower, MD   Past Medical History  Diagnosis Date  . Diverticulosis   . Hemorrhoids   . Hx of adenomatous colonic polyps   . Hyperlipidemia   . Hypertension   . CVA (cerebral vascular accident) ([place]) 2003  . Anticardiolipin antibody positive   . Perineal abscess   . Diabetes mellitus     type II  . GERD (gastroesophageal reflux disease)   . Peripheral vascular disease ([place])     peripheral vascular disease/carotid artery <100%  . Anxiety   . Depression   . 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 CEREBROVASCULAR ACCIDENT, HX OF 07/19/2008  . ANXIETY 07/19/2008  . DEPRESSION 07/19/2008  . DIABETES MELLITUS, TYPE II 01/18/2008  . GERD 07/19/2008  . HYPERLIPIDEMIA 07/19/2008  . HYPERTENSION 07/31/2009  . Increased prostate specific antigen (PSA) velocity 09/13/2010  . Unspecified Peripheral Vascular Disease 01/18/2008  . PERSONAL HX COLONIC POLYPS 12/10/2007  . AAA (abdominal aortic aneurysm) ([place])   . Varicose veins   . Internal  hemorrhoids     Past Surgical History  Procedure Laterality Date  . Hernia surgury      65 years old  . Perineal abscess    . Sp endo repair infraren aaa    . S/p right and left popliteal aneurysm repair    . 7 abdominal hernia repairs  05/31/10  . Colonoscopy    . Polypectomy    . Abdominal aortic aneurysm repair    . Abdominal aortagram N/A 01/12/2013    Procedure: ABDOMINAL Maxcine Ham;  Surgeon: Serafina Mitchell, MD;  Location: [facility] CATH LAB;  Service: Cardiovascular;  Laterality: N/A;  . Peripheral vascular catheterization N/A 11/23/2014    Procedure: Abdominal Aortogram;  Surgeon: Serafina Mitchell, MD;  Location: [place] CV LAB;  Service: Cardiovascular;  Laterality: N/A;  . Peripheral vascular catheterization  11/23/2014    Procedure: Lower Extremity Angiography;  Surgeon: Serafina Mitchell, MD;  Location: [place] CV LAB;  Service: Cardiovascular;;  . Peripheral vascular catheterization  11/23/2014    Procedure: Peripheral Vascular Intervention;  Surgeon: Serafina Mitchell, MD;  Location: [place] CV LAB;  Service: Cardiovascular;;  PTA bilateral common iliac PTA/DCB left fem-pop    Current Outpatient Prescriptions  Medication Sig Dispense Refill  . apixaban (ELIQUIS) 5 MG TABS tablet Take 1 tablet (5 mg total) by mouth 2 (two) times daily. 60 tablet 11  . ascorbic Acid (VITAMIN C CR) 500 MG CPCR Take 500  mg by mouth daily.     Marland Kitchen atorvastatin (LIPITOR) 40 MG tablet Take 1 tablet (40 mg total) by mouth daily. 90 tablet 3  . cetirizine (ZYRTEC) 10 MG tablet Take 1 tablet (10 mg total) by mouth daily. 90 tablet 3  . clopidogrel (PLAVIX) 75 MG tablet Take 1 tablet (75 mg total) by mouth daily. 90 tablet 3  . docusate sodium (COLACE) 100 MG capsule Take 100 mg by mouth 2 (two) times daily.     . Fiber CAPS Take 5.7 mg by mouth See admin instructions. Take (10) 0.57 capsules by mouth daily for regularity.    . folic acid (FOLVITE) 1 MG tablet Take 1 tablet (1 mg total) by mouth daily. 90  tablet 3  . gemfibrozil (LOPID) 600 MG tablet Take 2 tablets (1,200 mg total) by mouth daily. 180 tablet 3  . metFORMIN (GLUCOPHAGE-XR) 500 MG 24 hr tablet Take 4 tablets (2,000 mg total) by mouth daily. 4 tabs by mouth in the AM 360 tablet 3  . metroNIDAZOLE (METROGEL) 0.75 % gel Apply 1 application topically 2 (two) times daily.     . Multiple Vitamin (MULTIVITAMIN) capsule Take 1 capsule by mouth daily.     . Omega-3 Fatty Acids (FISH OIL PO) Take 2 capsules by mouth daily.     . pioglitazone (ACTOS) 30 MG tablet Take 1 tablet (30 mg total) by mouth daily. 90 tablet 3  . valsartan-hydrochlorothiazide (DIOVAN HCT) 320-25 MG tablet Take 1 tablet by mouth daily. 90 tablet 3  . ranitidine (ZANTAC) 150 MG tablet Take 1 tablet (150 mg total) by mouth 2 (two) times daily. 180 tablet 3   No current facility-administered medications for this visit.    Allergies  Allergen Reactions  . Latex Rash    Social History   Social History  . Marital Status: Married    Spouse Name: N/A  . Number of Children: 1  . Years of Education: N/A   Occupational History  . dsiabled due to stroke    Social History Main Topics  . Smoking status: Former Smoker    Types: Cigarettes    Quit date: 01/14/1992  . Smokeless tobacco: Never Used  . Alcohol Use: 1.2 oz/week    2 Glasses of wine per week     Comment: wine daily  . Drug Use: No  . Sexual Activity: Not on file   Other Topics Concern  . Not on file   Social History Narrative   2 cups of coffee in the morning and tea throughout the day.    Family History  Problem Relation Age of Onset  . Aortic aneurysm Mother     desceding aoritic aneurysm  . Cancer Brother     Lung cancer  . Colon cancer Neg Hx   . Esophageal cancer Neg Hx   . Stomach cancer Neg Hx   . Rectal cancer Neg Hx     Review of Systems:  As stated in the HPI and otherwise negative.   BP 128/72 mmHg  Pulse 63  Ht 5\' 10"  (1.778 m)  Wt 265 lb 3.2 oz (120.294 kg)  BMI  38.05 kg/m2  SpO2 99%  Physical Examination: General: Well developed, well nourished, NAD HEENT: OP clear, mucus membranes moist SKIN: warm, dry. No rashes. Neuro: No focal deficits Musculoskeletal: Muscle strength 5/5 all ext Psychiatric: Mood and affect normal Neck: No JVD, no carotid bruits, no thyromegaly, no lymphadenopathy. Lungs:Clear bilaterally, no wheezes, rhonci, crackles Cardiovascular: Irregular. No murmurs, gallops  or rubs. Abdomen:Soft. Bowel sounds present. Non-tender.  Extremities: No lower extremity edema.   EKG:  EKG is ordered today. The ekg ordered today demonstrates atrial fib, rate 63 bpm  Recent Labs: 01/19/2015: Hemoglobin 13.8; Platelets 210.0 09/12/2015: ALT 28; BUN 20; Creatinine, Ser 1.04; Potassium 4.3; Sodium 135   Lipid Panel    Component Value Date/Time   CHOL 114 09/12/2015 0833   TRIG 217.0* 09/12/2015 0833   HDL 35.60* 09/12/2015 0833   CHOLHDL 3 09/12/2015 0833   VLDL 43.4* 09/12/2015 0833   LDLCALC 55 02/27/2015 0842   LDLDIRECT 40.0 09/12/2015 0833     Wt Readings from Last 3 Encounters:  10/27/15 265 lb 3.2 oz (120.294 kg)  09/14/15 264 lb (119.75 kg)  03/13/15 258 lb (117.028 kg)     Other studies Reviewed: Additional studies/ records that were reviewed today include: all old records, hospital, echo images.  Review of the above records demonstrates:    Assessment and Plan:   1. Persistent atrial fibrillation: Rate is controlled.He is in atrial fib today. He is asymptomatic. Will continue continue Eliquis.    2. Essential hypertension: Controlled. No changes.   3. HLD: Managed by PCP. Continue statin.  4. Peripheral vascular disease/carotid disease, AAA: Follow up with [facility]. Continue antiplatelet Rx, statin.   5. History of CVA (cerebrovascular accident): Continue antiplatelet, statin.   6. Mitral regurgitation: Mild by echo July 2016.   Current medicines are reviewed at length with the patient today.  The  patient does not have concerns regarding medicines.  The following changes have been made:  no change  Labs/ tests ordered today include:   Orders Placed This Encounter  Procedures  . EKG 12-Lead    Disposition:   FU with me in 6 months  Signed, Lauree Chandler, MD 10/27/2015 9:36 AM    [facility] [address], [place], [place]  [address] Phone: [phone]; Fax: [phone]

## 2015-10-27 ENCOUNTER — Ambulatory Visit (INDEPENDENT_AMBULATORY_CARE_PROVIDER_SITE_OTHER): Payer: BLUE CROSS/BLUE SHIELD | Admitting: Cardiovascular Disease

## 2015-10-27 ENCOUNTER — Encounter: Payer: Self-pay | Admitting: Cardiovascular Disease

## 2015-10-27 VITALS — BP 128/72 | HR 63 | Ht 70.0 in | Wt 265.2 lb

## 2015-10-27 DIAGNOSIS — E785 Hyperlipidemia, unspecified: Secondary | ICD-10-CM | POA: Diagnosis not present

## 2015-10-27 DIAGNOSIS — I340 Nonrheumatic mitral (valve) insufficiency: Secondary | ICD-10-CM

## 2015-10-27 DIAGNOSIS — I4819 Other persistent atrial fibrillation: Secondary | ICD-10-CM

## 2015-10-27 DIAGNOSIS — I6523 Occlusion and stenosis of bilateral carotid arteries: Secondary | ICD-10-CM

## 2015-10-27 DIAGNOSIS — I481 Persistent atrial fibrillation: Secondary | ICD-10-CM

## 2015-10-27 DIAGNOSIS — I739 Peripheral vascular disease, unspecified: Secondary | ICD-10-CM

## 2015-10-27 DIAGNOSIS — I10 Essential (primary) hypertension: Secondary | ICD-10-CM | POA: Diagnosis not present

## 2015-10-27 MED ORDER — APIXABAN 5 MG PO TABS: 5.0000 mg | ORAL_TABLET | Freq: Two times a day (BID) | ORAL | Status: DC

## 2015-10-27 NOTE — Patient Instructions (Signed)

## 2015-10-30 ENCOUNTER — Ambulatory Visit (INDEPENDENT_AMBULATORY_CARE_PROVIDER_SITE_OTHER)
Admission: RE | Admit: 2015-10-30 | Discharge: 2015-10-30 | Disposition: A | Discharge status: Home / Self Care | Payer: BLUE CROSS/BLUE SHIELD | Source: Ambulatory Visit | Attending: Surgery | Admitting: Surgery

## 2015-10-30 ENCOUNTER — Ambulatory Visit (HOSPITAL_COMMUNITY)
Admission: RE | Admit: 2015-10-30 | Discharge: 2015-10-30 | Disposition: A | Discharge status: Home / Self Care | Payer: BLUE CROSS/BLUE SHIELD | Source: Ambulatory Visit | Attending: Surgery | Admitting: Surgery

## 2015-10-30 ENCOUNTER — Encounter: Payer: Self-pay | Admitting: Surgery

## 2015-10-30 ENCOUNTER — Ambulatory Visit (INDEPENDENT_AMBULATORY_CARE_PROVIDER_SITE_OTHER): Payer: BLUE CROSS/BLUE SHIELD | Admitting: Surgery

## 2015-10-30 VITALS — BP 174/94 | HR 58 | Temp 97.8°F | Resp 16 | Ht 70.0 in | Wt 263.0 lb

## 2015-10-30 DIAGNOSIS — I739 Peripheral vascular disease, unspecified: Secondary | ICD-10-CM

## 2015-10-30 DIAGNOSIS — R0989 Other specified symptoms and signs involving the circulatory and respiratory systems: Secondary | ICD-10-CM | POA: Diagnosis present

## 2015-10-30 DIAGNOSIS — F419 Anxiety disorder, unspecified: Secondary | ICD-10-CM | POA: Diagnosis not present

## 2015-10-30 DIAGNOSIS — R938 Abnormal findings on diagnostic imaging of other specified body structures: Secondary | ICD-10-CM | POA: Diagnosis not present

## 2015-10-30 DIAGNOSIS — I10 Essential (primary) hypertension: Secondary | ICD-10-CM | POA: Insufficient documentation

## 2015-10-30 DIAGNOSIS — F329 Major depressive disorder, single episode, unspecified: Secondary | ICD-10-CM | POA: Diagnosis not present

## 2015-10-30 DIAGNOSIS — K219 Gastro-esophageal reflux disease without esophagitis: Secondary | ICD-10-CM | POA: Insufficient documentation

## 2015-10-30 DIAGNOSIS — I6523 Occlusion and stenosis of bilateral carotid arteries: Secondary | ICD-10-CM | POA: Insufficient documentation

## 2015-10-30 DIAGNOSIS — E119 Type 2 diabetes mellitus without complications: Secondary | ICD-10-CM | POA: Diagnosis not present

## 2015-10-30 DIAGNOSIS — E785 Hyperlipidemia, unspecified: Secondary | ICD-10-CM | POA: Diagnosis not present

## 2015-10-30 LAB — VAS US CAROTID
LCCADDIAS: 5 cm/s
LCCADSYS: 62 cm/s
LCCAPDIAS: 7 cm/s
LEFT ECA DIAS: -15 cm/s
LEFT VERTEBRAL DIAS: 15 cm/s
Left CCA prox sys: 126 cm/s
RCCAPDIAS: 16 cm/s
RCCAPSYS: 77 cm/s
RIGHT CCA MID DIAS: 20 cm/s
RIGHT ECA DIAS: 26 cm/s
RIGHT VERTEBRAL DIAS: -1 cm/s
Right cca dist sys: -123 cm/s

## 2015-10-30 NOTE — Progress Notes (Signed)
[facility] [place]  Patient name: BENNETTE MANIGAULT MRN: [id number] DOB: 09-05-1950 Sex: male  REASON FOR VISIT: follow up  HPI:  The patient comes back today for followup. He has undergone open abdominal aortic aneurysm repair by Dr. Amedeo Plenty in 2012. 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 He had his left popliteal aneurysm repaired by above-knee to below-knee bypass in October of 2010 he's had a midline abdominal hernia repair by Dr. gross. He had elevated velocities in the proximal anastomosis of his bypass graft and underwent arteriogram which revealed a high-grade stenosis extending into his bypass graft balloon angioplasty was performed however this resulted in a dissection and he therefore underwent stenting using a 6 mm stent. He was found to have in-stent stenosis on the left. On 01/12/2013 he underwent angiography with a successful balloon angioplasty of the stent using a 6 mm balloon.   Therefore on 11/23/2014 he underwent drug coated balloon angioplasty of the left proximal anastomosis which has previously been stented. He also underwent balloon angioplasty of a left common iliac stenosis and angioplasty of the right common iliac stenosis.  He remains in atrial fibrillation which is treated with Eliquis.  He is also on Plavix.  He has no symptoms of claudication.  He has had no neurologic symptoms.  Past Medical History  Diagnosis Date  . Diverticulosis   . Hemorrhoids   . Hx of adenomatous colonic polyps   . Hyperlipidemia   . Hypertension   . CVA (cerebral vascular accident) ([place]) 2003  . Anticardiolipin antibody positive   . Perineal abscess   . Diabetes mellitus     type II  . GERD (gastroesophageal reflux disease)   . Peripheral vascular disease ([place])     peripheral vascular disease/carotid artery <100%  . Anxiety   . Depression   . 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  CEREBROVASCULAR ACCIDENT, HX OF 07/19/2008  . ANXIETY 07/19/2008  . DEPRESSION 07/19/2008  . DIABETES MELLITUS, TYPE II 01/18/2008  . GERD 07/19/2008  . HYPERLIPIDEMIA 07/19/2008  . HYPERTENSION 07/31/2009  . Increased prostate specific antigen (PSA) velocity 09/13/2010  . Unspecified Peripheral Vascular Disease 01/18/2008  . PERSONAL HX COLONIC POLYPS 12/10/2007  . AAA (abdominal aortic aneurysm) ([place])   . Varicose veins   . Internal hemorrhoids     Family History  Problem Relation Age of Onset  . Aortic aneurysm Mother     desceding aoritic aneurysm  . Cancer Brother     Lung cancer  . Colon cancer Neg Hx   . Esophageal cancer Neg Hx   . Stomach cancer Neg Hx   . Rectal cancer Neg Hx     SOCIAL HISTORY: Social History  Substance Use Topics  . Smoking status: Former Smoker    Types: Cigarettes    Quit date: 01/14/1992  . Smokeless tobacco: Never Used  . Alcohol Use: 1.2 oz/week    2 Glasses of wine per week     Comment: wine daily    Allergies  Allergen Reactions  . Latex Rash    Current Outpatient Prescriptions  Medication Sig Dispense Refill  . apixaban (ELIQUIS) 5 MG TABS tablet Take 1 tablet (5 mg total) by mouth 2 (two) times daily. 60 tablet 11  . ascorbic Acid (VITAMIN C CR) 500 MG CPCR Take 500 mg by mouth daily.     Marland Kitchen atorvastatin (LIPITOR) 40 MG tablet Take 1 tablet (40 mg total) by mouth  daily. 90 tablet 3  . cetirizine (ZYRTEC) 10 MG tablet Take 1 tablet (10 mg total) by mouth daily. 90 tablet 3  . clopidogrel (PLAVIX) 75 MG tablet Take 1 tablet (75 mg total) by mouth daily. 90 tablet 3  . docusate sodium (COLACE) 100 MG capsule Take 100 mg by mouth 2 (two) times daily.     . Fiber CAPS Take 5.7 mg by mouth See admin instructions. Take (10) 0.57 capsules by mouth daily for regularity.    . folic acid (FOLVITE) 1 MG tablet Take 1 tablet (1 mg total) by mouth daily. 90 tablet 3  . gemfibrozil (LOPID) 600 MG tablet Take 2 tablets (1,200 mg total) by mouth daily.  180 tablet 3  . metFORMIN (GLUCOPHAGE-XR) 500 MG 24 hr tablet Take 4 tablets (2,000 mg total) by mouth daily. 4 tabs by mouth in the AM 360 tablet 3  . metroNIDAZOLE (METROGEL) 0.75 % gel Apply 1 application topically 2 (two) times daily.     . Multiple Vitamin (MULTIVITAMIN) capsule Take 1 capsule by mouth daily.     . Omega-3 Fatty Acids (FISH OIL PO) Take 2 capsules by mouth daily.     . pioglitazone (ACTOS) 30 MG tablet Take 1 tablet (30 mg total) by mouth daily. 90 tablet 3  . ranitidine (ZANTAC) 150 MG tablet Take 1 tablet (150 mg total) by mouth 2 (two) times daily. 180 tablet 3  . valsartan-hydrochlorothiazide (DIOVAN HCT) 320-25 MG tablet Take 1 tablet by mouth daily. 90 tablet 3   No current facility-administered medications for this visit.    REVIEW OF SYSTEMS:  [X]  denotes positive finding, [ ]  denotes negative finding Cardiac  Comments:  Chest pain or chest pressure:    Shortness of breath upon exertion:    Short of breath when lying flat:    Irregular heart rhythm:        Vascular    Pain in calf, thigh, or hip brought on by ambulation:    Pain in feet at night that wakes you up from your sleep:     Blood clot in your veins:    Leg swelling:         Pulmonary    Oxygen at home:    Productive cough:     Wheezing:         Neurologic    Sudden weakness in arms or legs:     Sudden numbness in arms or legs:     Sudden onset of difficulty speaking or slurred speech:    Temporary loss of vision in one eye:     Problems with dizziness:         Gastrointestinal    Blood in stool:     Vomited blood:         Genitourinary    Burning when urinating:     Blood in urine:        Psychiatric    Major depression:         Hematologic    Bleeding problems:    Problems with blood clotting too easily:        Skin    Rashes or ulcers:        Constitutional    Fever or chills:      PHYSICAL EXAM: There were no vitals filed for this visit.  GENERAL: The patient is  a well-nourished male, in no acute distress. The vital signs are documented above. CARDIAC: There is a regular rate and rhythm.  PULMONARY: There is good air exchange bilaterally without wheezing or rales. MUSCULOSKELETAL: There are no major deformities or cyanosis. NEUROLOGIC: No focal weakness or paresthesias are detected. SKIN: There are no ulcers or rashes noted. PSYCHIATRIC: The patient has a normal affect.  DATA:  I have reviewed his ultrasound with the following findings: ABI: 1.0 on the right.  1.0 on the left. Left lower extremity duplex shows biphasic waveforms with no elevated velocities within the bypass graft Aortoiliac duplex: Limited visibility.  No hemodynamically significant velocity elevations Carotid duplex: Known left carotid artery occlusion.  Less than 40% right carotid stenosis  MEDICAL ISSUES: Patient has done very well since his last visit.  Ultrasound today did not show any worrisome lesions.  He will follow up in 6 months with repeat Doppler studies.  Carotid studies will be repeated in one year    [name], MD [facility] [phone] Pager [phone]

## 2016-01-17 ENCOUNTER — Encounter: Payer: Self-pay | Admitting: Internal Medicine

## 2016-01-30 ENCOUNTER — Encounter: Payer: Self-pay | Admitting: Internal Medicine

## 2016-01-30 NOTE — Telephone Encounter (Signed)
Corinne to see above 

## 2016-02-13 ENCOUNTER — Encounter: Payer: Self-pay | Admitting: Surgery

## 2016-02-13 NOTE — Progress Notes (Unsigned)
Pt was called an appt scheduled for 04/2016 for 6 mo fu appts/ awt

## 2016-02-28 ENCOUNTER — Telehealth: Payer: Self-pay | Admitting: Emergency Medicine

## 2016-02-28 NOTE — Telephone Encounter (Signed)
error 

## 2016-03-15 ENCOUNTER — Other Ambulatory Visit: Payer: Self-pay | Admitting: *Deleted

## 2016-03-15 ENCOUNTER — Other Ambulatory Visit (INDEPENDENT_AMBULATORY_CARE_PROVIDER_SITE_OTHER): Payer: BLUE CROSS/BLUE SHIELD

## 2016-03-15 DIAGNOSIS — Z0001 Encounter for general adult medical examination with abnormal findings: Secondary | ICD-10-CM | POA: Diagnosis not present

## 2016-03-15 DIAGNOSIS — E1149 Type 2 diabetes mellitus with other diabetic neurological complication: Secondary | ICD-10-CM

## 2016-03-15 DIAGNOSIS — R7989 Other specified abnormal findings of blood chemistry: Secondary | ICD-10-CM | POA: Diagnosis not present

## 2016-03-15 DIAGNOSIS — I739 Peripheral vascular disease, unspecified: Secondary | ICD-10-CM

## 2016-03-15 LAB — BASIC METABOLIC PANEL
BUN: 22 mg/dL (ref 6–23)
CALCIUM: 10.1 mg/dL (ref 8.4–10.5)
CO2: 29 mEq/L (ref 19–32)
CREATININE: 0.99 mg/dL (ref 0.40–1.50)
Chloride: 101 mEq/L (ref 96–112)
GFR: 80.03 mL/min (ref >60.00)
GLUCOSE: 143 mg/dL — AB (ref 70–99)
POTASSIUM: 4.4 meq/L (ref 3.5–5.1)
Sodium: 138 mEq/L (ref 135–145)

## 2016-03-15 LAB — LIPID PANEL
Cholesterol: 127 mg/dL (ref 0–200)
HDL: 40.6 mg/dL (ref >39.00)
NonHDL: 86.38
Total CHOL/HDL Ratio: 3
Triglycerides: 205 mg/dL — ABNORMAL HIGH (ref 0.0–149.0)
VLDL: 41 mg/dL — AB (ref 0.0–40.0)

## 2016-03-15 LAB — CBC WITH DIFFERENTIAL/PLATELET
BASOS PCT: 1.7 % (ref 0.0–3.0)
Basophils Absolute: 0.1 10*3/uL (ref 0.0–0.1)
EOS ABS: 0.4 10*3/uL (ref 0.0–0.7)
Eosinophils Relative: 10.7 % — ABNORMAL HIGH (ref 0.0–5.0)
HEMATOCRIT: 39.1 % (ref 39.0–52.0)
Hemoglobin: 13.4 g/dL (ref 13.0–17.0)
LYMPHS ABS: 0.9 10*3/uL (ref 0.7–4.0)
LYMPHS PCT: 22.5 % (ref 12.0–46.0)
MCHC: 34.4 g/dL (ref 30.0–36.0)
MCV: 96.6 fl (ref 78.0–100.0)
Monocytes Absolute: 0.5 10*3/uL (ref 0.1–1.0)
Monocytes Relative: 12.7 % — ABNORMAL HIGH (ref 3.0–12.0)
NEUTROS ABS: 2.2 10*3/uL (ref 1.4–7.7)
NEUTROS PCT: 52.4 % (ref 43.0–77.0)
PLATELETS: 205 10*3/uL (ref 150.0–400.0)
RBC: 4.05 Mil/uL — ABNORMAL LOW (ref 4.22–5.81)
RDW: 12.7 % (ref 11.5–15.5)
WBC: 4.1 10*3/uL (ref 4.0–10.5)

## 2016-03-15 LAB — URINALYSIS, ROUTINE W REFLEX MICROSCOPIC
Bilirubin Urine: NEGATIVE
Ketones, ur: NEGATIVE
Leukocytes, UA: NEGATIVE
Nitrite: NEGATIVE
PH: 7 (ref 5.0–8.0)
SPECIFIC GRAVITY, URINE: 1.02 (ref 1.000–1.030)
Total Protein, Urine: 100 — AB
Urine Glucose: NEGATIVE
Urobilinogen, UA: 0.2 (ref 0.0–1.0)

## 2016-03-15 LAB — HEMOGLOBIN A1C: Hgb A1c MFr Bld: 5.8 % (ref 4.6–6.5)

## 2016-03-15 LAB — LDL CHOLESTEROL, DIRECT: LDL DIRECT: 51 mg/dL

## 2016-03-15 LAB — HEPATIC FUNCTION PANEL
ALK PHOS: 32 U/L — AB (ref 39–117)
ALT: 26 U/L (ref 0–53)
AST: 26 U/L (ref 0–37)
Albumin: 4.2 g/dL (ref 3.5–5.2)
BILIRUBIN DIRECT: 0.1 mg/dL (ref 0.0–0.3)
BILIRUBIN TOTAL: 0.6 mg/dL (ref 0.2–1.2)
Total Protein: 7.2 g/dL (ref 6.0–8.3)

## 2016-03-15 LAB — MICROALBUMIN / CREATININE URINE RATIO
CREATININE, U: 70.4 mg/dL
MICROALB UR: 154.3 mg/dL — AB (ref 0.0–1.9)
MICROALB/CREAT RATIO: 219.2 mg/g — AB (ref 0.0–30.0)

## 2016-03-15 LAB — PSA: PSA: 2.45 ng/mL (ref 0.10–4.00)

## 2016-03-15 LAB — TSH: TSH: 1.64 u[IU]/mL (ref 0.35–4.50)

## 2016-03-19 ENCOUNTER — Ambulatory Visit (INDEPENDENT_AMBULATORY_CARE_PROVIDER_SITE_OTHER): Payer: BLUE CROSS/BLUE SHIELD | Admitting: Internal Medicine

## 2016-03-19 ENCOUNTER — Encounter: Payer: Self-pay | Admitting: Internal Medicine

## 2016-03-19 ENCOUNTER — Telehealth: Payer: Self-pay

## 2016-03-19 VITALS — BP 142/82 | HR 66 | Temp 98.0°F | Resp 20 | Wt 262.0 lb

## 2016-03-19 DIAGNOSIS — E1149 Type 2 diabetes mellitus with other diabetic neurological complication: Secondary | ICD-10-CM

## 2016-03-19 DIAGNOSIS — Z0001 Encounter for general adult medical examination with abnormal findings: Secondary | ICD-10-CM

## 2016-03-19 DIAGNOSIS — I10 Essential (primary) hypertension: Secondary | ICD-10-CM

## 2016-03-19 MED ORDER — FOLIC ACID 1 MG PO TABS: 1.0000 mg | ORAL_TABLET | Freq: Every day | ORAL | 3 refills | Status: DC

## 2016-03-19 MED ORDER — AMLODIPINE BESYLATE 5 MG PO TABS: 5.0000 mg | ORAL_TABLET | Freq: Every day | ORAL | 3 refills | Status: DC

## 2016-03-19 MED ORDER — VALSARTAN-HYDROCHLOROTHIAZIDE 320-25 MG PO TABS
1.0000 | ORAL_TABLET | Freq: Every day | ORAL | 3 refills | Status: DC
Start: 2016-03-19 — End: 2016-11-13

## 2016-03-19 MED ORDER — CLOPIDOGREL BISULFATE 75 MG PO TABS: 75.0000 mg | ORAL_TABLET | Freq: Every day | ORAL | 3 refills | Status: DC

## 2016-03-19 MED ORDER — GEMFIBROZIL 600 MG PO TABS: 1200.0000 mg | ORAL_TABLET | Freq: Every day | ORAL | 3 refills | Status: DC

## 2016-03-19 MED ORDER — ATORVASTATIN CALCIUM 40 MG PO TABS: 40.0000 mg | ORAL_TABLET | Freq: Every day | ORAL | 3 refills | Status: DC

## 2016-03-19 MED ORDER — METFORMIN HCL ER 500 MG PO TB24: 2000.0000 mg | ORAL_TABLET | Freq: Every day | ORAL | 3 refills | Status: DC

## 2016-03-19 MED ORDER — PIOGLITAZONE HCL 30 MG PO TABS
30.0000 mg | ORAL_TABLET | Freq: Every day | ORAL | 3 refills | Status: DC
Start: 2016-03-19 — End: 2016-11-13

## 2016-03-19 NOTE — Telephone Encounter (Signed)
Medication refills sent to pharmacy 

## 2016-03-19 NOTE — Progress Notes (Signed)
Pre visit review using our clinic review tool, if applicable. No additional management support is needed unless otherwise documented below in the visit note. 

## 2016-03-19 NOTE — Progress Notes (Signed)
Subjective:    Patient ID: David [name], male    DOB: 07-Nov-1950, 65 y.o.   MRN: [id number]  HPI    Here for wellness and f/u;  Overall doing ok;  Pt denies Chest pain, worsening SOB, DOE, wheezing, orthopnea, PND, worsening LE edema, palpitations, dizziness or syncope.  Pt denies neurological change such as new headache, facial or extremity weakness.  Pt denies polydipsia, polyuria, or low sugar symptoms. Pt states overall good compliance with treatment and medications, good tolerability, and has been trying to follow appropriate diet.  Pt denies worsening depressive symptoms, suicidal ideation or panic. No fever, night sweats, wt loss, loss of appetite, or other constitutional symptoms.  Pt states good ability with ADL's, has low fall risk, home safety reviewed and adequate, no other significant changes in hearing or vision, and only occasionally active with exercise. No other new complaints and no other changes to hx Past Medical History:  Diagnosis Date  . AAA (abdominal aortic aneurysm) ([place])   . 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 Anticardiolipin antibody positive   . Anxiety   . ANXIETY 07/19/2008  . CEREBROVASCULAR ACCIDENT, HX OF 07/19/2008  . CVA (cerebral vascular accident) ([place]) 2003  . Depression   . DEPRESSION 07/19/2008  . Diabetes mellitus    type II  . DIABETES MELLITUS, TYPE II 01/18/2008  . Diverticulosis   . GERD 07/19/2008  . GERD (gastroesophageal reflux disease)   . Hemorrhoids   . Hx of adenomatous colonic polyps   . Hyperlipidemia   . HYPERLIPIDEMIA 07/19/2008  . Hypertension   . HYPERTENSION 07/31/2009  . Increased prostate specific antigen (PSA) velocity 09/13/2010  . Internal hemorrhoids   . Perineal abscess   . Peripheral vascular disease ([place])    peripheral vascular disease/carotid artery <100%  . PERSONAL HX COLONIC POLYPS 12/10/2007  . Unspecified Peripheral Vascular Disease 01/18/2008  . Varicose veins    Past Surgical History:    Procedure Laterality Date  . 7 abdominal hernia repairs  05/31/10  . ABDOMINAL AORTAGRAM N/A 01/12/2013   Procedure: ABDOMINAL Maxcine Ham;  Surgeon: Serafina Mitchell, MD;  Location: [facility] CATH LAB;  Service: Cardiovascular;  Laterality: N/A;  . ABDOMINAL AORTIC ANEURYSM REPAIR    . COLONOSCOPY    . hernia surgury     65 years old  . perineal abscess    . PERIPHERAL VASCULAR CATHETERIZATION N/A 11/23/2014   Procedure: Abdominal Aortogram;  Surgeon: Serafina Mitchell, MD;  Location: [place] CV LAB;  Service: Cardiovascular;  Laterality: N/A;  . PERIPHERAL VASCULAR CATHETERIZATION  11/23/2014   Procedure: Lower Extremity Angiography;  Surgeon: Serafina Mitchell, MD;  Location: [place] CV LAB;  Service: Cardiovascular;;  . PERIPHERAL VASCULAR CATHETERIZATION  11/23/2014   Procedure: Peripheral Vascular Intervention;  Surgeon: Serafina Mitchell, MD;  Location: [place] CV LAB;  Service: Cardiovascular;;  PTA bilateral common iliac PTA/DCB left fem-pop  . POLYPECTOMY    . 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 SP ENDO REPAIR INFRAREN AAA      reports that he quit smoking about 24 years ago. His smoking use included Cigarettes. He has never used smokeless tobacco. He reports that he drinks about 1.2 oz of alcohol per week . He reports that he does not use drugs. family history includes Aortic aneurysm in his mother; Cancer in his brother. Allergies  Allergen Reactions  . Latex Rash   Current Outpatient Prescriptions on File Prior to Visit  Medication Sig Dispense Refill  . apixaban (ELIQUIS) 5 MG TABS tablet Take 1 tablet (5 mg total) by mouth 2 (two) times daily. 60 tablet 11  . ascorbic Acid (VITAMIN C CR) 500 MG CPCR Take 500 mg by mouth daily.     . cetirizine (ZYRTEC) 10 MG tablet Take 1 tablet (10 mg total) by mouth daily. 90 tablet 3  . docusate sodium (COLACE) 100 MG capsule Take 100 mg by mouth 2 (two) times daily.     . Fiber CAPS Take 5.7 mg by mouth See admin instructions.  Take (10) 0.57 capsules by mouth daily for regularity.    . metroNIDAZOLE (METROGEL) 0.75 % gel Apply 1 application topically 2 (two) times daily.     . Multiple Vitamin (MULTIVITAMIN) capsule Take 1 capsule by mouth daily.     . Omega-3 Fatty Acids (FISH OIL PO) Take 2 capsules by mouth daily.     . ranitidine (ZANTAC) 150 MG tablet Take 1 tablet (150 mg total) by mouth 2 (two) times daily. 180 tablet 3   No current facility-administered medications on file prior to visit.    Review of Systems Constitutional: Negative for increased diaphoresis, or other activity, appetite or siginficant weight change other than noted HENT: Negative for worsening hearing loss, ear pain, facial swelling, mouth sores and neck stiffness.   Eyes: Negative for other worsening pain, redness or visual disturbance.  Respiratory: Negative for choking or stridor Cardiovascular: Negative for other chest pain and palpitations.  Gastrointestinal: Negative for worsening diarrhea, blood in stool, or abdominal distention Genitourinary: Negative for hematuria, flank pain or change in urine volume.  Musculoskeletal: Negative for myalgias or other joint complaints.  Skin: Negative for other color change and wound or drainage.  Neurological: Negative for syncope and numbness. other than noted Hematological: Negative for adenopathy. or other swelling Psychiatric/Behavioral: Negative for hallucinations, SI, self-injury, decreased concentration or other worsening agitation.  All other system neg per pt    Objective:   Physical Exam BP (!) 142/82   Pulse 66   Temp 98 F (36.7 C) (Oral)   Resp 20   Wt 262 lb (118.8 kg)   SpO2 97%   BMI 37.59 kg/m  VS noted,  Constitutional: Pt is oriented to person, place, and time. Appears well-developed and well-nourished, in no significant distress Head: Normocephalic and atraumatic  Eyes: Conjunctivae and EOM are normal. Pupils are equal, round, and reactive to light Right Ear:  External ear normal.  Left Ear: External ear normal Nose: Nose normal.  Mouth/Throat: Oropharynx is clear and moist  Neck: Normal range of motion. Neck supple. No JVD present. No tracheal deviation present or significant neck LA or mass Cardiovascular: Normal rate, regular rhythm, normal heart sounds and intact distal pulses.   Pulmonary/Chest: Effort normal and breath sounds without rales or wheezing  Abdominal: Soft. Bowel sounds are normal. NT. No HSM  Musculoskeletal: Normal range of motion. Exhibits no edema Lymphadenopathy: Has no cervical adenopathy.  Neurological: Pt is alert and oriented to person, place, and time. Pt has normal reflexes. No cranial nerve deficit. Motor grossly intact Skin: Skin is warm and dry. No rash noted or new ulcers Psychiatric:  Has normal mood and affect. Behavior is normal.  No other significant exam changes       Assessment & Plan:

## 2016-03-19 NOTE — Patient Instructions (Addendum)
.  Please take all new medication as prescribed - the amlodipine 5 mg per day  Please continue all other medications as before, and refills have been done if requested.  Please have the pharmacy call with any other refills you may need.  Please continue your efforts at being more active, low cholesterol diet, and weight control.  You are otherwise up to date with prevention measures today.  Please keep your appointments with your specialists as you may have planned  Please return in 6 months, or sooner if needed, with Lab testing done 3-5 days before

## 2016-03-21 ENCOUNTER — Encounter: Payer: Self-pay | Admitting: Internal Medicine

## 2016-03-21 LAB — HM DIABETES EYE EXAM

## 2016-03-21 NOTE — Progress Notes (Signed)
Abstract done.

## 2016-03-24 NOTE — Assessment & Plan Note (Signed)
Mild uncontrolled, to add amlodipine 5 qd

## 2016-03-24 NOTE — Assessment & Plan Note (Signed)
stable overall by history and exam, recent data reviewed with pt, and pt to continue medical treatment as before,  to f/u any worsening symptoms or concerns Lab Results  Component Value Date   HGBA1C 5.8 03/15/2016

## 2016-03-24 NOTE — Assessment & Plan Note (Signed)

## 2016-04-17 ENCOUNTER — Encounter (HOSPITAL_COMMUNITY): Payer: BLUE CROSS/BLUE SHIELD

## 2016-04-22 ENCOUNTER — Ambulatory Visit: Payer: BLUE CROSS/BLUE SHIELD | Admitting: Surgery

## 2016-05-13 ENCOUNTER — Ambulatory Visit: Payer: BLUE CROSS/BLUE SHIELD | Admitting: Surgery

## 2016-05-14 ENCOUNTER — Ambulatory Visit (INDEPENDENT_AMBULATORY_CARE_PROVIDER_SITE_OTHER): Payer: BLUE CROSS/BLUE SHIELD | Admitting: Internal Medicine

## 2016-05-14 ENCOUNTER — Encounter: Payer: Self-pay | Admitting: Internal Medicine

## 2016-05-14 DIAGNOSIS — I10 Essential (primary) hypertension: Secondary | ICD-10-CM | POA: Diagnosis not present

## 2016-05-14 DIAGNOSIS — R062 Wheezing: Secondary | ICD-10-CM | POA: Diagnosis not present

## 2016-05-14 DIAGNOSIS — E1149 Type 2 diabetes mellitus with other diabetic neurological complication: Secondary | ICD-10-CM | POA: Diagnosis not present

## 2016-05-14 DIAGNOSIS — R05 Cough: Secondary | ICD-10-CM | POA: Diagnosis not present

## 2016-05-14 DIAGNOSIS — R059 Cough, unspecified: Secondary | ICD-10-CM | POA: Insufficient documentation

## 2016-05-14 MED ORDER — AZITHROMYCIN 250 MG PO TABS: ORAL_TABLET | ORAL | 1 refills | Status: DC

## 2016-05-14 MED ORDER — PREDNISONE 10 MG PO TABS: ORAL_TABLET | ORAL | 0 refills | Status: DC

## 2016-05-14 MED ORDER — ALBUTEROL SULFATE HFA 108 (90 BASE) MCG/ACT IN AERS: 2.0000 | INHALATION_SPRAY | Freq: Four times a day (QID) | RESPIRATORY_TRACT | 2 refills | Status: DC | PRN

## 2016-05-14 MED ORDER — HYDROCODONE-HOMATROPINE 5-1.5 MG/5ML PO SYRP: 5.0000 mL | ORAL_SOLUTION | Freq: Four times a day (QID) | ORAL | 0 refills | Status: AC | PRN

## 2016-05-14 NOTE — Assessment & Plan Note (Signed)
Mild to mod, for depomedrol IM 80, predpac asd, to f/u any worsening symptoms or concerns 

## 2016-05-14 NOTE — Assessment & Plan Note (Signed)
stable overall by history and exam, recent data reviewed with pt, and pt to continue medical treatment as before,  to f/u any worsening symptoms or concerns BP Readings from Last 3 Encounters:  05/14/16 128/82  03/19/16 (!) 142/82  10/30/15 (!) 174/94

## 2016-05-14 NOTE — Assessment & Plan Note (Signed)
Mild to mod, c/w bronchitis vs pna, declines cxr, for antibx course,  Cough med prn, to f/u any worsening symptoms or concerns 

## 2016-05-14 NOTE — Patient Instructions (Signed)
You had the steroid shot today  Please take all new medication as prescribed - the antibiotic, cough medicine if needed, prednisone, and the inhaler if needed  Please continue all other medications as before, and refills have been done if requested.  Please have the pharmacy call with any other refills you may need.  Please keep your appointments with your specialists as you may have planned

## 2016-05-14 NOTE — Progress Notes (Signed)
Pre visit review using our clinic review tool, if applicable. No additional management support is needed unless otherwise documented below in the visit note. 

## 2016-05-14 NOTE — Assessment & Plan Note (Signed)
stable overall by history and exam, recent data reviewed with pt, and pt to continue medical treatment as before,  to f/u any worsening symptoms or concerns Lab Results  Component Value Date   HGBA1C 5.8 03/15/2016   Pt to call for onset polys or cbg > 200

## 2016-05-14 NOTE — Progress Notes (Signed)
Subjective:    Patient ID: David [name], male    DOB: [birth year], 66 y.o.   MRN: [id number]  HPI Here with acute onset mild to mod 6-7 days ST, HA, general weakness and malaise, with prod cough greenish sputum, but Pt denies chest pain, increased sob or doe, wheezing, orthopnea, PND, increased LE swelling, palpitations, dizziness or syncope, except for onset mild wheeziness/sob especially at night the last several days, wife quite concerned  Pt denies new neurological symptoms such as new headache, or facial or extremity weakness or numbness   Pt denies polydipsia, polyuria.   Past Medical History:  Diagnosis Date  . AAA (abdominal aortic aneurysm) ([place])   . 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 Anticardiolipin antibody positive   . Anxiety   . ANXIETY 07/19/2008  . CEREBROVASCULAR ACCIDENT, HX OF 07/19/2008  . CVA (cerebral vascular accident) ([place]) 2003  . Depression   . DEPRESSION 07/19/2008  . Diabetes mellitus    type II  . DIABETES MELLITUS, TYPE II 01/18/2008  . Diverticulosis   . GERD 07/19/2008  . GERD (gastroesophageal reflux disease)   . Hemorrhoids   . Hx of adenomatous colonic polyps   . Hyperlipidemia   . HYPERLIPIDEMIA 07/19/2008  . Hypertension   . HYPERTENSION 07/31/2009  . Increased prostate specific antigen (PSA) velocity 09/13/2010  . Internal hemorrhoids   . Perineal abscess   . Peripheral vascular disease ([place])    peripheral vascular disease/carotid artery <100%  . PERSONAL HX COLONIC POLYPS 12/10/2007  . Unspecified Peripheral Vascular Disease 01/18/2008  . Varicose veins    Past Surgical History:  Procedure Laterality Date  . 7 abdominal hernia repairs  05/31/10  . ABDOMINAL AORTAGRAM N/A 01/12/2013   Procedure: ABDOMINAL Maxcine Ham;  Surgeon: Serafina Mitchell, MD;  Location: [facility] CATH LAB;  Service: Cardiovascular;  Laterality: N/A;  . ABDOMINAL AORTIC ANEURYSM REPAIR    . COLONOSCOPY    . hernia surgury     66 years old  . perineal abscess      . PERIPHERAL VASCULAR CATHETERIZATION N/A 11/23/2014   Procedure: Abdominal Aortogram;  Surgeon: Serafina Mitchell, MD;  Location: [place] CV LAB;  Service: Cardiovascular;  Laterality: N/A;  . PERIPHERAL VASCULAR CATHETERIZATION  11/23/2014   Procedure: Lower Extremity Angiography;  Surgeon: Serafina Mitchell, MD;  Location: [place] CV LAB;  Service: Cardiovascular;;  . PERIPHERAL VASCULAR CATHETERIZATION  11/23/2014   Procedure: Peripheral Vascular Intervention;  Surgeon: Serafina Mitchell, MD;  Location: [place] CV LAB;  Service: Cardiovascular;;  PTA bilateral common iliac PTA/DCB left fem-pop  . POLYPECTOMY    . 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 SP ENDO REPAIR INFRAREN AAA      reports that he quit smoking about 24 years ago. His smoking use included Cigarettes. He has never used smokeless tobacco. He reports that he drinks about 1.2 oz of alcohol per week . He reports that he does not use drugs. family history includes Aortic aneurysm in his mother; Cancer in his brother. Allergies  Allergen Reactions  . Latex Rash   Current Outpatient Prescriptions on File Prior to Visit  Medication Sig Dispense Refill  . amLODipine (NORVASC) 5 MG tablet Take 1 tablet (5 mg total) by mouth daily. 90 tablet 3  . apixaban (ELIQUIS) 5 MG TABS tablet Take 1 tablet (5 mg total) by mouth 2 (two) times daily. 60 tablet 11  . ascorbic Acid (VITAMIN C CR) 500  MG CPCR Take 500 mg by mouth daily.     Marland Kitchen atorvastatin (LIPITOR) 40 MG tablet Take 1 tablet (40 mg total) by mouth daily. 90 tablet 3  . cetirizine (ZYRTEC) 10 MG tablet Take 1 tablet (10 mg total) by mouth daily. 90 tablet 3  . clopidogrel (PLAVIX) 75 MG tablet Take 1 tablet (75 mg total) by mouth daily. 90 tablet 3  . docusate sodium (COLACE) 100 MG capsule Take 100 mg by mouth 2 (two) times daily.     . Fiber CAPS Take 5.7 mg by mouth See admin instructions. Take (10) 0.57 capsules by mouth daily for regularity.    . folic acid  (FOLVITE) 1 MG tablet Take 1 tablet (1 mg total) by mouth daily. 90 tablet 3  . gemfibrozil (LOPID) 600 MG tablet Take 2 tablets (1,200 mg total) by mouth daily. 180 tablet 3  . metFORMIN (GLUCOPHAGE-XR) 500 MG 24 hr tablet Take 4 tablets (2,000 mg total) by mouth daily. 4 tabs by mouth in the AM 360 tablet 3  . metroNIDAZOLE (METROGEL) 0.75 % gel Apply 1 application topically 2 (two) times daily.     . Multiple Vitamin (MULTIVITAMIN) capsule Take 1 capsule by mouth daily.     . Omega-3 Fatty Acids (FISH OIL PO) Take 2 capsules by mouth daily.     . pioglitazone (ACTOS) 30 MG tablet Take 1 tablet (30 mg total) by mouth daily. 90 tablet 3  . valsartan-hydrochlorothiazide (DIOVAN HCT) 320-25 MG tablet Take 1 tablet by mouth daily. 90 tablet 3  . ranitidine (ZANTAC) 150 MG tablet Take 1 tablet (150 mg total) by mouth 2 (two) times daily. 180 tablet 3   No current facility-administered medications on file prior to visit.    Review of Systems  Constitutional: Negative for unusual diaphoresis or night sweats HENT: Negative for ear swelling or discharge Eyes: Negative for worsening visual haziness  Respiratory: Negative for choking and stridor.   Gastrointestinal: Negative for distension or worsening eructation Genitourinary: Negative for retention or change in urine volume.  Musculoskeletal: Negative for other MSK pain or swelling Skin: Negative for color change and worsening wound Neurological: Negative for tremors and numbness other than noted  Psychiatric/Behavioral: Negative for decreased concentration or agitation other than above   All other system neg per pt    Objective:   Physical Exam BP 128/82   Pulse 90   Temp 98.9 F (37.2 C) (Oral)   Resp 20   Wt 261 lb (118.4 kg)   SpO2 96%   BMI 37.45 kg/m  VS noted, mild ill, morbid obese Constitutional: Pt appears in no apparent distress HENT: Head: NCAT.  Right Ear: External ear normal.  Left Ear: External ear normal.  Bilat tm's  with mild erythema.  Max sinus areas non tender.  Pharynx with mild erythema, no exudate Eyes: . Pupils are equal, round, and reactive to light. Conjunctivae and EOM are normal Neck: Normal range of motion. Neck supple. with few bilat tender submandib LA Cardiovascular: Normal rate and regular rhythm.   Pulmonary/Chest: Effort normal and breath sounds decreased without rales but with few scattered wheezing.  Neurological: Pt is alert. Not confused , motor grossly intact Skin: Skin is warm. No rash, no LE edema Psychiatric: Pt behavior is normal. No agitation.  No other new exam findings    Assessment & Plan:

## 2016-06-12 ENCOUNTER — Encounter: Payer: Self-pay | Admitting: Surgery

## 2016-07-01 ENCOUNTER — Ambulatory Visit (HOSPITAL_COMMUNITY)
Admission: RE | Admit: 2016-07-01 | Discharge: 2016-07-01 | Disposition: A | Discharge status: Home / Self Care | Payer: Medicare Other | Source: Ambulatory Visit | Attending: Surgery | Admitting: Surgery

## 2016-07-01 ENCOUNTER — Ambulatory Visit (INDEPENDENT_AMBULATORY_CARE_PROVIDER_SITE_OTHER)
Admission: RE | Admit: 2016-07-01 | Discharge: 2016-07-01 | Disposition: A | Discharge status: Home / Self Care | Payer: Medicare Other | Source: Ambulatory Visit | Attending: Surgery | Admitting: Surgery

## 2016-07-01 ENCOUNTER — Ambulatory Visit: Payer: Medicare Other

## 2016-07-01 DIAGNOSIS — I739 Peripheral vascular disease, unspecified: Secondary | ICD-10-CM

## 2016-07-05 ENCOUNTER — Encounter: Payer: Self-pay | Admitting: Surgery

## 2016-07-15 ENCOUNTER — Encounter: Payer: Self-pay | Admitting: Surgery

## 2016-07-15 ENCOUNTER — Ambulatory Visit (INDEPENDENT_AMBULATORY_CARE_PROVIDER_SITE_OTHER): Payer: Medicare Other | Admitting: Surgery

## 2016-07-15 VITALS — BP 154/84 | HR 69 | Temp 97.5°F | Resp 18 | Ht 70.0 in | Wt 268.0 lb

## 2016-07-15 DIAGNOSIS — I714 Abdominal aortic aneurysm, without rupture, unspecified: Secondary | ICD-10-CM

## 2016-07-15 DIAGNOSIS — I6523 Occlusion and stenosis of bilateral carotid arteries: Secondary | ICD-10-CM | POA: Diagnosis not present

## 2016-07-15 DIAGNOSIS — I724 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: Secondary | ICD-10-CM | POA: Diagnosis not present

## 2016-07-15 DIAGNOSIS — I739 Peripheral vascular disease, unspecified: Secondary | ICD-10-CM

## 2016-07-15 NOTE — Progress Notes (Signed)
[facility] [place]  Patient name: David [name] MRN: [id number] DOB: April 27, 1951 Sex: male   REASON FOR VISIT:    Follow up  HISOTRY OF PRESENT ILLNESS:   The patient comes back today for followup. He has undergone open abdominal aortic aneurysm repair by Dr. Amedeo Plenty in 2012. 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 He had his left popliteal aneurysm repaired by above-knee to below-knee bypass in October of 2010 he's had a midline abdominal hernia repair by Dr. gross. He had elevated velocities in the proximal anastomosis of his bypass graft and underwent arteriogram which revealed a high-grade stenosis extending into his bypass graft balloon angioplasty was performed however this resulted in a dissection and he therefore underwent stenting using a 6 mm stent. He was found to have in-stent stenosis on the left. On 01/12/2013 he underwent angiography with a successful balloon angioplasty of the stent using a 6 mm balloon.   Therefore on 11/23/2014 he underwent drug coated balloon angioplasty of the left proximal anastomosis which has previously been stented. He also underwent balloon angioplasty of a left common iliac stenosis and angioplasty of the right common iliac stenosis.  He remains in atrial fibrillation which is treated with Eliquis.  He is also on Plavix.  He has no symptoms of claudication.  He has had no neurologic symptoms.   PAST MEDICAL HISTORY:   Past Medical History:  Diagnosis Date  . AAA (abdominal aortic aneurysm) ([place])   . 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 ([place]) 09/07/2008  . Anticardiolipin antibody positive   . Anxiety   . ANXIETY 07/19/2008  . CEREBROVASCULAR ACCIDENT, HX OF 07/19/2008  . CVA (cerebral vascular accident) ([place]) 2003  . Depression   . DEPRESSION 07/19/2008  . Diabetes mellitus    type II  . DIABETES MELLITUS, TYPE II 01/18/2008  . Diverticulosis   . GERD 07/19/2008    . GERD (gastroesophageal reflux disease)   . Hemorrhoids   . Hx of adenomatous colonic polyps   . Hyperlipidemia   . HYPERLIPIDEMIA 07/19/2008  . Hypertension   . HYPERTENSION 07/31/2009  . Increased prostate specific antigen (PSA) velocity 09/13/2010  . Internal hemorrhoids   . Perineal abscess   . Peripheral vascular disease ([place])    peripheral vascular disease/carotid artery <100%  . PERSONAL HX COLONIC POLYPS 12/10/2007  . Unspecified Peripheral Vascular Disease 01/18/2008  . Varicose veins      FAMILY HISTORY:   Family History  Problem Relation Age of Onset  . Aortic aneurysm Mother     desceding aoritic aneurysm  . Cancer Brother     Lung cancer  . Colon cancer Neg Hx   . Esophageal cancer Neg Hx   . Stomach cancer Neg Hx   . Rectal cancer Neg Hx     SOCIAL HISTORY:   Social History  Substance Use Topics  . Smoking status: Former Smoker    Types: Cigarettes    Quit date: 01/14/1992  . Smokeless tobacco: Never Used  . Alcohol use 1.2 oz/week    2 Glasses of wine per week     Comment: wine daily     ALLERGIES:   Allergies  Allergen Reactions  . Latex Rash     CURRENT MEDICATIONS:   Current Outpatient Prescriptions  Medication Sig Dispense Refill  . amLODipine (NORVASC) 5 MG tablet Take 1 tablet (5 mg total) by mouth daily. 90 tablet 3  . apixaban (ELIQUIS) 5 MG TABS tablet Take  1 tablet (5 mg total) by mouth 2 (two) times daily. 60 tablet 11  . ascorbic Acid (VITAMIN C CR) 500 MG CPCR Take 500 mg by mouth daily.     Marland Kitchen atorvastatin (LIPITOR) 40 MG tablet Take 1 tablet (40 mg total) by mouth daily. 90 tablet 3  . cetirizine (ZYRTEC) 10 MG tablet Take 1 tablet (10 mg total) by mouth daily. 90 tablet 3  . clopidogrel (PLAVIX) 75 MG tablet Take 1 tablet (75 mg total) by mouth daily. 90 tablet 3  . docusate sodium (COLACE) 100 MG capsule Take 100 mg by mouth 2 (two) times daily.     . Fiber CAPS Take 5.7 mg by mouth See admin instructions. Take (10) 0.57  capsules by mouth daily for regularity.    . folic acid (FOLVITE) 1 MG tablet Take 1 tablet (1 mg total) by mouth daily. 90 tablet 3  . gemfibrozil (LOPID) 600 MG tablet Take 2 tablets (1,200 mg total) by mouth daily. 180 tablet 3  . metFORMIN (GLUCOPHAGE-XR) 500 MG 24 hr tablet Take 4 tablets (2,000 mg total) by mouth daily. 4 tabs by mouth in the AM 360 tablet 3  . metroNIDAZOLE (METROGEL) 0.75 % gel Apply 1 application topically 2 (two) times daily.     . Multiple Vitamin (MULTIVITAMIN) capsule Take 1 capsule by mouth daily.     . Omega-3 Fatty Acids (FISH OIL PO) Take 2 capsules by mouth daily.     . pioglitazone (ACTOS) 30 MG tablet Take 1 tablet (30 mg total) by mouth daily. 90 tablet 3  . valsartan-hydrochlorothiazide (DIOVAN HCT) 320-25 MG tablet Take 1 tablet by mouth daily. 90 tablet 3  . albuterol (PROVENTIL HFA;VENTOLIN HFA) 108 (90 Base) MCG/ACT inhaler Inhale 2 puffs into the lungs every 6 (six) hours as needed for wheezing or shortness of breath. (Patient not taking: Reported on 07/15/2016) 1 Inhaler 2  . azithromycin (ZITHROMAX Z-PAK) 250 MG tablet 2 tab by mouth day 1, then 1 per day (Patient not taking: Reported on 07/15/2016) 6 tablet 1  . predniSONE (DELTASONE) 10 MG tablet 3 tabs by mouth per day for 3 days,2tabs per day for 3 days,1tab per day for 3 days (Patient not taking: Reported on 07/15/2016) 18 tablet 0  . ranitidine (ZANTAC) 150 MG tablet Take 1 tablet (150 mg total) by mouth 2 (two) times daily. 180 tablet 3   No current facility-administered medications for this visit.     REVIEW OF SYSTEMS:   [X]  denotes positive finding, [ ]  denotes negative finding Cardiac  Comments:  Chest pain or chest pressure:    Shortness of breath upon exertion:    Short of breath when lying flat:    Irregular heart rhythm:        Vascular    Pain in calf, thigh, or hip brought on by ambulation:    Pain in feet at night that wakes you up from your sleep:     Blood clot in your veins:     Leg swelling:  x       Pulmonary    Oxygen at home:    Productive cough:     Wheezing:         Neurologic    Sudden weakness in arms or legs:     Sudden numbness in arms or legs:     Sudden onset of difficulty speaking or slurred speech:    Temporary loss of vision in one eye:     Problems with  dizziness:         Gastrointestinal    Blood in stool:     Vomited blood:         Genitourinary    Burning when urinating:     Blood in urine:        Psychiatric    Major depression:         Hematologic    Bleeding problems:    Problems with blood clotting too easily:        Skin    Rashes or ulcers:        Constitutional    Fever or chills:      PHYSICAL EXAM:   There were no vitals filed for this visit.  GENERAL: The patient is a well-nourished male, in no acute distress. The vital signs are documented above. CARDIAC: There is a regular rate and rhythm.  VASCULAR: Bilateral lower extremity edema, left greater than right PULMONARY: Non-labored respirations  MUSCULOSKELETAL: There are no major deformities or cyanosis. NEUROLOGIC: No focal weakness or paresthesias are detected. SKIN: There are no ulcers or rashes noted. PSYCHIATRIC: The patient has a normal affect.  STUDIES:   ABIs were performed on both legs.  0.99 on the right and 0.92 on the left bypass graft on the left side was interrogated.  This appears to be widely patent with no stenosis greater than 50%  MEDICAL ISSUES:   Abdominal aortic aneurysm: This was imaged with ultrasound approximate one year ago.  This will need to be repeated in approximately 2 years  Lower extremity bypass graft, left colon duplex today shows patency of the bypass graft.  This will be reevaluated in 6 months  Right leg stenting: This will be evaluated with duplex in 6 months  Carotid stenosis: He is scheduled for carotid duplex in 6 months.  He has a known left carotid occlusion with no significant right carotid  stenosis    [name], MD [facility] [phone] Pager [phone]

## 2016-07-19 NOTE — Addendum Note (Signed)
Addended by: Lianne Cure A on: 07/19/2016 03:18 PM   Modules accepted: Orders

## 2016-09-25 ENCOUNTER — Encounter: Payer: BLUE CROSS/BLUE SHIELD | Admitting: Internal Medicine

## 2016-10-07 ENCOUNTER — Encounter: Payer: Self-pay | Admitting: Cardiovascular Disease

## 2016-10-07 ENCOUNTER — Ambulatory Visit (INDEPENDENT_AMBULATORY_CARE_PROVIDER_SITE_OTHER): Payer: Medicare Other | Admitting: Cardiovascular Disease

## 2016-10-07 VITALS — BP 138/80 | HR 59 | Ht 70.0 in | Wt 270.4 lb

## 2016-10-07 DIAGNOSIS — I10 Essential (primary) hypertension: Secondary | ICD-10-CM

## 2016-10-07 DIAGNOSIS — I6523 Occlusion and stenosis of bilateral carotid arteries: Secondary | ICD-10-CM | POA: Diagnosis not present

## 2016-10-07 DIAGNOSIS — I4819 Other persistent atrial fibrillation: Secondary | ICD-10-CM

## 2016-10-07 DIAGNOSIS — I739 Peripheral vascular disease, unspecified: Secondary | ICD-10-CM | POA: Diagnosis not present

## 2016-10-07 DIAGNOSIS — I481 Persistent atrial fibrillation: Secondary | ICD-10-CM | POA: Diagnosis not present

## 2016-10-07 MED ORDER — APIXABAN 5 MG PO TABS: 5.0000 mg | ORAL_TABLET | Freq: Two times a day (BID) | ORAL | 3 refills | Status: DC

## 2016-10-07 NOTE — Progress Notes (Signed)
Chief Complaint  Patient presents with  . Follow-up     History of Present Illness: 66 yo male with history of HTN, HLD, DM, atrial fibrillation, PAD, AAA status post open repair 2012, remote CVA, GERD here today for cardiac follow up. He was found to have atrial fibrillation in 2016 while in the hospital for a PV procedure. He has been on Eliquis and Plavix. He has not been on AV nodal blocking agents. His PAD is followed by Dr. Trula Slade.  Nuclear stress test 2010 with no ischemia. Echo July 2016 with normal LV systolic function, RDEY=81-44%. His wife is also my patient.   He is here today for follow up. The patient denies any chest pain, dyspnea, palpitations, lower extremity edema, orthopnea, PND, dizziness, near syncope or syncope.   Primary Care Physician: Biagio Borg, MD   Past Medical History:  Diagnosis Date  . AAA (abdominal aortic aneurysm) ([place])   . 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 ([place]) 09/07/2008  . Anticardiolipin antibody positive   . Anxiety   . ANXIETY 07/19/2008  . CEREBROVASCULAR ACCIDENT, HX OF 07/19/2008  . CVA (cerebral vascular accident) ([place]) 2003  . Depression   . DEPRESSION 07/19/2008  . Diabetes mellitus    type II  . DIABETES MELLITUS, TYPE II 01/18/2008  . Diverticulosis   . GERD 07/19/2008  . GERD (gastroesophageal reflux disease)   . Hemorrhoids   . Hx of adenomatous colonic polyps   . Hyperlipidemia   . HYPERLIPIDEMIA 07/19/2008  . Hypertension   . HYPERTENSION 07/31/2009  . Increased prostate specific antigen (PSA) velocity 09/13/2010  . Internal hemorrhoids   . Perineal abscess   . Peripheral vascular disease ([place])    peripheral vascular disease/carotid artery <100%  . PERSONAL HX COLONIC POLYPS 12/10/2007  . Unspecified Peripheral Vascular Disease 01/18/2008  . Varicose veins     Past Surgical History:  Procedure Laterality Date  . 7 abdominal hernia repairs  05/31/10  . ABDOMINAL AORTAGRAM N/A 01/12/2013   Procedure: ABDOMINAL Maxcine Ham;   Surgeon: Serafina Mitchell, MD;  Location: [facility] CATH LAB;  Service: Cardiovascular;  Laterality: N/A;  . ABDOMINAL AORTIC ANEURYSM REPAIR    . COLONOSCOPY    . hernia surgury     66 years old  . perineal abscess    . PERIPHERAL VASCULAR CATHETERIZATION N/A 11/23/2014   Procedure: Abdominal Aortogram;  Surgeon: Serafina Mitchell, MD;  Location: [place] CV LAB;  Service: Cardiovascular;  Laterality: N/A;  . PERIPHERAL VASCULAR CATHETERIZATION  11/23/2014   Procedure: Lower Extremity Angiography;  Surgeon: Serafina Mitchell, MD;  Location: [place] CV LAB;  Service: Cardiovascular;;  . PERIPHERAL VASCULAR CATHETERIZATION  11/23/2014   Procedure: Peripheral Vascular Intervention;  Surgeon: Serafina Mitchell, MD;  Location: [place] CV LAB;  Service: Cardiovascular;;  PTA bilateral common iliac PTA/DCB left fem-pop  . POLYPECTOMY    . 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 SP ENDO REPAIR INFRAREN AAA      Current Outpatient Prescriptions  Medication Sig Dispense Refill  . amLODipine (NORVASC) 5 MG tablet Take 1 tablet (5 mg total) by mouth daily. 90 tablet 3  . apixaban (ELIQUIS) 5 MG TABS tablet Take 1 tablet (5 mg total) by mouth 2 (two) times daily. 180 tablet 3  . ascorbic Acid (VITAMIN C CR) 500 MG CPCR Take 500 mg by mouth daily.     Marland Kitchen atorvastatin (LIPITOR) 40 MG tablet Take 1 tablet (40 mg total)  by mouth daily. 90 tablet 3  . cetirizine (ZYRTEC) 10 MG tablet Take 1 tablet (10 mg total) by mouth daily. 90 tablet 3  . clopidogrel (PLAVIX) 75 MG tablet Take 1 tablet (75 mg total) by mouth daily. 90 tablet 3  . docusate sodium (COLACE) 100 MG capsule Take 100 mg by mouth 2 (two) times daily.     . Fiber CAPS Take 5.7 mg by mouth See admin instructions. Take (10) 0.57 capsules by mouth daily for regularity.    . folic acid (FOLVITE) 1 MG tablet Take 1 tablet (1 mg total) by mouth daily. 90 tablet 3  . gemfibrozil (LOPID) 600 MG tablet Take 2 tablets (1,200 mg total) by mouth  daily. 180 tablet 3  . metFORMIN (GLUCOPHAGE-XR) 500 MG 24 hr tablet Take 4 tablets (2,000 mg total) by mouth daily. 4 tabs by mouth in the AM 360 tablet 3  . metroNIDAZOLE (METROGEL) 0.75 % gel Apply 1 application topically 2 (two) times daily.     . Multiple Vitamin (MULTIVITAMIN) capsule Take 1 capsule by mouth daily.     . Omega-3 Fatty Acids (FISH OIL PO) Take 2 capsules by mouth daily.     . pioglitazone (ACTOS) 30 MG tablet Take 1 tablet (30 mg total) by mouth daily. 90 tablet 3  . valsartan-hydrochlorothiazide (DIOVAN HCT) 320-25 MG tablet Take 1 tablet by mouth daily. 90 tablet 3  . ranitidine (ZANTAC) 150 MG tablet Take 1 tablet (150 mg total) by mouth 2 (two) times daily. 180 tablet 3   No current facility-administered medications for this visit.     Allergies  Allergen Reactions  . Latex Rash    Social History   Social History  . Marital status: Married    Spouse name: N/A  . Number of children: 1  . Years of education: N/A   Occupational History  . dsiabled due to stroke Retired   Social History Main Topics  . Smoking status: Former Smoker    Types: Cigarettes    Quit date: 01/14/1992  . Smokeless tobacco: Never Used  . Alcohol use 1.2 oz/week    2 Glasses of wine per week     Comment: wine daily  . Drug use: No  . Sexual activity: Not on file   Other Topics Concern  . Not on file   Social History Narrative   2 cups of coffee in the morning and tea throughout the day.    Family History  Problem Relation Age of Onset  . Aortic aneurysm Mother        desceding aoritic aneurysm  . Cancer Brother        Lung cancer  . Colon cancer Neg Hx   . Esophageal cancer Neg Hx   . Stomach cancer Neg Hx   . Rectal cancer Neg Hx     Review of Systems:  As stated in the HPI and otherwise negative.   BP 138/80   Pulse (!) 59   Ht 5\' 10"  (1.778 m)   Wt 270 lb 6.4 oz (122.7 kg)   SpO2 98%   BMI 38.80 kg/m   Physical Examination: General: Well developed,  well nourished, NAD  HEENT: OP clear, mucus membranes moist  SKIN: warm, dry. No rashes. Neuro: No focal deficits  Musculoskeletal: Muscle strength 5/5 all ext  Psychiatric: Mood and affect normal  Neck: No JVD, no carotid bruits, no thyromegaly, no lymphadenopathy.  Lungs:Clear bilaterally, no wheezes, rhonci, crackles Cardiovascular: Irregular. No murmurs, gallops or  rubs. Abdomen:Soft. Bowel sounds present. Non-tender.  Extremities: No lower extremity edema.   Echo July 2016: Left ventricle: The cavity size was normal. Wall thickness was   normal. Systolic function was normal. The estimated ejection   fraction was in the range of 55% to 60%. Wall motion was normal;   there were no regional wall motion abnormalities. - Ascending aorta: The ascending aorta was mildly dilated. - Mitral valve: There was mild regurgitation. - Left atrium: The atrium was moderately dilated. - Right atrium: The atrium was mildly dilated.  EKG:  EKG is ordered today. The ekg ordered today demonstrates Atrial fib, rate 50 bpm.   Recent Labs: 03/15/2016: ALT 26; BUN 22; Creatinine, Ser 0.99; Hemoglobin 13.4; Platelets 205.0; Potassium 4.4; Sodium 138; TSH 1.64   Lipid Panel    Component Value Date/Time   CHOL 127 03/15/2016 0816   TRIG 205.0 (H) 03/15/2016 0816   HDL 40.60 03/15/2016 0816   CHOLHDL 3 03/15/2016 0816   VLDL 41.0 (H) 03/15/2016 0816   LDLCALC 55 02/27/2015 0842   LDLDIRECT 51.0 03/15/2016 0816     Wt Readings from Last 3 Encounters:  10/07/16 270 lb 6.4 oz (122.7 kg)  07/15/16 268 lb (121.6 kg)  05/14/16 261 lb (118.4 kg)     Other studies Reviewed: Additional studies/ records that were reviewed today include: all old records, hospital, echo images.  Review of the above records demonstrates:    Assessment and Plan:   1. Persistent atrial fibrillation: He is in atrial fib today. He is asymptomatic. Rate is controlled on no rate controlled agents. Will continue Eliquis.    2. Essential hypertension: BP controlled. No changes.   3. HLD: He is on a statin. Lipids followed in primary care.   4. Peripheral vascular disease/carotid disease, AAA: Followed in [facility]  5. Mitral regurgitation: mild by echo 2016.   Current medicines are reviewed at length with the patient today.  The patient does not have concerns regarding medicines.  The following changes have been made:  no change  Labs/ tests ordered today include:   Orders Placed This Encounter  Procedures  . EKG 12-Lead    Disposition:   FU with me in 12 months  Signed, Lauree Chandler, MD 10/07/2016 9:11 AM    [address], [place], [place]  [address] Phone: [phone]; Fax: [phone]

## 2016-10-07 NOTE — Patient Instructions (Signed)

## 2016-11-04 ENCOUNTER — Encounter (HOSPITAL_COMMUNITY): Payer: BLUE CROSS/BLUE SHIELD

## 2016-11-04 ENCOUNTER — Ambulatory Visit: Payer: BLUE CROSS/BLUE SHIELD | Admitting: Surgery

## 2016-11-11 ENCOUNTER — Encounter: Payer: Self-pay | Admitting: Internal Medicine

## 2016-11-11 DIAGNOSIS — E7849 Other hyperlipidemia: Secondary | ICD-10-CM

## 2016-11-11 DIAGNOSIS — E119 Type 2 diabetes mellitus without complications: Secondary | ICD-10-CM

## 2016-11-11 NOTE — Telephone Encounter (Signed)
Lab orders that MD place for pt cpx had expired on 10/15/16. Re-enter labs pt has cpx on 11/13/16...Johny Chess

## 2016-11-12 ENCOUNTER — Other Ambulatory Visit (INDEPENDENT_AMBULATORY_CARE_PROVIDER_SITE_OTHER): Payer: Medicare Other

## 2016-11-12 DIAGNOSIS — E784 Other hyperlipidemia: Secondary | ICD-10-CM

## 2016-11-12 DIAGNOSIS — E7849 Other hyperlipidemia: Secondary | ICD-10-CM

## 2016-11-12 DIAGNOSIS — E119 Type 2 diabetes mellitus without complications: Secondary | ICD-10-CM | POA: Diagnosis not present

## 2016-11-12 LAB — LIPID PANEL
CHOL/HDL RATIO: 3
Cholesterol: 118 mg/dL (ref 0–200)
HDL: 37.4 mg/dL — AB (ref >39.00)
LDL Cholesterol: 50 mg/dL (ref 0–99)
NONHDL: 80.2
Triglycerides: 149 mg/dL (ref 0.0–149.0)
VLDL: 29.8 mg/dL (ref 0.0–40.0)

## 2016-11-12 LAB — BASIC METABOLIC PANEL
BUN: 22 mg/dL (ref 6–23)
CALCIUM: 10 mg/dL (ref 8.4–10.5)
CHLORIDE: 99 meq/L (ref 96–112)
CO2: 27 meq/L (ref 19–32)
Creatinine, Ser: 1.01 mg/dL (ref 0.40–1.50)
GFR: 78.05 mL/min (ref >60.00)
Glucose, Bld: 143 mg/dL — ABNORMAL HIGH (ref 70–99)
POTASSIUM: 4.7 meq/L (ref 3.5–5.1)
SODIUM: 135 meq/L (ref 135–145)

## 2016-11-12 LAB — URINALYSIS, ROUTINE W REFLEX MICROSCOPIC
BILIRUBIN URINE: NEGATIVE
Ketones, ur: NEGATIVE
LEUKOCYTES UA: NEGATIVE
Nitrite: NEGATIVE
Specific Gravity, Urine: 1.015 (ref 1.000–1.030)
Total Protein, Urine: 100 — AB
UROBILINOGEN UA: 0.2 (ref 0.0–1.0)
Urine Glucose: NEGATIVE
pH: 6 (ref 5.0–8.0)

## 2016-11-12 LAB — HEPATIC FUNCTION PANEL
ALK PHOS: 32 U/L — AB (ref 39–117)
ALT: 17 U/L (ref 0–53)
AST: 21 U/L (ref 0–37)
Albumin: 4.1 g/dL (ref 3.5–5.2)
BILIRUBIN DIRECT: 0.2 mg/dL (ref 0.0–0.3)
BILIRUBIN TOTAL: 0.5 mg/dL (ref 0.2–1.2)
Total Protein: 7.6 g/dL (ref 6.0–8.3)

## 2016-11-12 LAB — MICROALBUMIN / CREATININE URINE RATIO
CREATININE, U: 80.9 mg/dL
MICROALB/CREAT RATIO: 214.4 mg/g — AB (ref 0.0–30.0)
Microalb, Ur: 173.4 mg/dL — ABNORMAL HIGH (ref 0.0–1.9)

## 2016-11-12 LAB — HEMOGLOBIN A1C: HEMOGLOBIN A1C: 5.8 % (ref 4.6–6.5)

## 2016-11-13 ENCOUNTER — Ambulatory Visit (INDEPENDENT_AMBULATORY_CARE_PROVIDER_SITE_OTHER): Payer: Medicare Other | Admitting: Internal Medicine

## 2016-11-13 ENCOUNTER — Encounter: Payer: Self-pay | Admitting: Internal Medicine

## 2016-11-13 VITALS — BP 126/84 | HR 75 | Ht 70.0 in

## 2016-11-13 DIAGNOSIS — I739 Peripheral vascular disease, unspecified: Secondary | ICD-10-CM | POA: Diagnosis not present

## 2016-11-13 DIAGNOSIS — E1149 Type 2 diabetes mellitus with other diabetic neurological complication: Secondary | ICD-10-CM | POA: Diagnosis not present

## 2016-11-13 DIAGNOSIS — I6523 Occlusion and stenosis of bilateral carotid arteries: Secondary | ICD-10-CM | POA: Diagnosis not present

## 2016-11-13 DIAGNOSIS — M542 Cervicalgia: Secondary | ICD-10-CM

## 2016-11-13 DIAGNOSIS — R221 Localized swelling, mass and lump, neck: Secondary | ICD-10-CM | POA: Diagnosis not present

## 2016-11-13 DIAGNOSIS — E785 Hyperlipidemia, unspecified: Secondary | ICD-10-CM | POA: Diagnosis not present

## 2016-11-13 DIAGNOSIS — I10 Essential (primary) hypertension: Secondary | ICD-10-CM

## 2016-11-13 DIAGNOSIS — N320 Bladder-neck obstruction: Secondary | ICD-10-CM

## 2016-11-13 MED ORDER — CLOPIDOGREL BISULFATE 75 MG PO TABS: 75.0000 mg | ORAL_TABLET | Freq: Every day | ORAL | 3 refills | Status: DC

## 2016-11-13 MED ORDER — VALSARTAN-HYDROCHLOROTHIAZIDE 320-25 MG PO TABS: 1.0000 | ORAL_TABLET | Freq: Every day | ORAL | 3 refills | Status: DC

## 2016-11-13 MED ORDER — ATORVASTATIN CALCIUM 40 MG PO TABS: 40.0000 mg | ORAL_TABLET | Freq: Every day | ORAL | 3 refills | Status: DC

## 2016-11-13 MED ORDER — METFORMIN HCL ER 500 MG PO TB24: 2000.0000 mg | ORAL_TABLET | Freq: Every day | ORAL | 3 refills | Status: DC

## 2016-11-13 MED ORDER — AMLODIPINE BESYLATE 5 MG PO TABS: 5.0000 mg | ORAL_TABLET | Freq: Every day | ORAL | 3 refills | Status: DC

## 2016-11-13 MED ORDER — PIOGLITAZONE HCL 30 MG PO TABS: 30.0000 mg | ORAL_TABLET | Freq: Every day | ORAL | 3 refills | Status: DC

## 2016-11-13 MED ORDER — GEMFIBROZIL 600 MG PO TABS: 1200.0000 mg | ORAL_TABLET | Freq: Every day | ORAL | 3 refills | Status: DC

## 2016-11-13 NOTE — Patient Instructions (Signed)
Please continue all other medications as before, and refills have been done if requested.  Please have the pharmacy call with any other refills you may need.  Please continue your efforts at being more active, low cholesterol diet, and weight control.  You are otherwise up to date with prevention measures today.  Please keep your appointments with your specialists as you may have planned  You will be contacted regarding the referral for: ENT  Please remember to sign up for MyChart if you have not done so, as this will be important to you in the future with finding out test results, communicating by private email, and scheduling acute appointments online when needed.  Please return in 6 months, or sooner if needed, with Lab testing done 3-5 days before

## 2016-11-13 NOTE — Progress Notes (Signed)
Subjective:    Patient ID: David [name], male    DOB: 01-25-1951, 66 y.o.   MRN: [id number]  HPI  Here for yearly f/u;  Overall doing ok;  Pt denies Chest pain, worsening SOB, DOE, wheezing, orthopnea, PND, worsening LE edema, palpitations, dizziness or syncope.  Pt denies neurological change such as new headache, facial or extremity weakness.  Pt denies polydipsia, polyuria, or low sugar symptoms. Pt states overall good compliance with treatment and medications, good tolerability, and has been trying to follow appropriate diet.  Pt denies worsening depressive symptoms, suicidal ideation or panic. No fever, night sweats, wt loss, loss of appetite, or other constitutional symptoms.  Pt states good ability with ADL's, has low fall risk, home safety reviewed and adequate, no other significant changes in hearing or vision, and only occasionally active with exercise.  No new complaint  Has f/u with vasc surgyury every 6 mo, due for carotid and LE art dz exams next month Past Medical History:  Diagnosis Date  . AAA (abdominal aortic aneurysm) ([place])   . 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 ([place]) 09/07/2008  . Anticardiolipin antibody positive   . Anxiety   . ANXIETY 07/19/2008  . CEREBROVASCULAR ACCIDENT, HX OF 07/19/2008  . CVA (cerebral vascular accident) ([place]) 2003  . Depression   . DEPRESSION 07/19/2008  . Diabetes mellitus    type II  . DIABETES MELLITUS, TYPE II 01/18/2008  . Diverticulosis   . GERD 07/19/2008  . GERD (gastroesophageal reflux disease)   . Hemorrhoids   . Hx of adenomatous colonic polyps   . Hyperlipidemia   . HYPERLIPIDEMIA 07/19/2008  . Hypertension   . HYPERTENSION 07/31/2009  . Increased prostate specific antigen (PSA) velocity 09/13/2010  . Internal hemorrhoids   . Perineal abscess   . Peripheral vascular disease ([place])    peripheral vascular disease/carotid artery <100%  . PERSONAL HX COLONIC POLYPS 12/10/2007  . Unspecified Peripheral Vascular Disease 01/18/2008  .  Varicose veins    Past Surgical History:  Procedure Laterality Date  . 7 abdominal hernia repairs  05/31/10  . ABDOMINAL AORTAGRAM N/A 01/12/2013   Procedure: ABDOMINAL Maxcine Ham;  Surgeon: Serafina Mitchell, MD;  [facility] CATH LAB;  Service: Cardiovascular;  Laterality: N/A;  . ABDOMINAL AORTIC ANEURYSM REPAIR    . COLONOSCOPY    . hernia surgury     66 years old  . perineal abscess    . PERIPHERAL VASCULAR CATHETERIZATION N/A 11/23/2014   Procedure: Abdominal Aortogram;  Surgeon: Serafina Mitchell, MD;  Location: [place]  CV LAB;  Service: Cardiovascular;  Laterality: N/A;  . PERIPHERAL VASCULAR CATHETERIZATION  11/23/2014   Procedure: Lower Extremity Angiography;  Surgeon: Serafina Mitchell, MD;  Location: [place] CV LAB;  Service: Cardiovascular;;  . PERIPHERAL VASCULAR CATHETERIZATION  11/23/2014   Procedure: Peripheral Vascular Intervention;  Surgeon: Serafina Mitchell, MD;  Location: [place] CV LAB;  Service: Cardiovascular;;  PTA bilateral common iliac PTA/DCB left fem-pop  . POLYPECTOMY    . 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 SP ENDO REPAIR INFRAREN AAA      reports that he quit smoking about 24 years ago. His smoking use included Cigarettes. He has never used smokeless tobacco. He reports that he drinks about 1.2 oz of alcohol per week . He reports that he does not use drugs. family history includes Aortic aneurysm in his mother; Cancer in his brother. Allergies  Allergen Reactions  . Latex Rash  Current Outpatient Prescriptions on File Prior to Visit  Medication Sig Dispense Refill  . apixaban (ELIQUIS) 5 MG TABS tablet Take 1 tablet (5 mg total) by mouth 2 (two) times daily. 180 tablet 3  . ascorbic Acid (VITAMIN C CR) 500 MG CPCR Take 500 mg by mouth daily.     . cetirizine (ZYRTEC) 10 MG tablet Take 1 tablet (10 mg total) by mouth daily. 90 tablet 3  . docusate sodium (COLACE) 100 MG capsule Take 100 mg by mouth 2 (two) times daily.     . Fiber CAPS  Take 5.7 mg by mouth See admin instructions. Take (10) 0.57 capsules by mouth daily for regularity.    . folic acid (FOLVITE) 1 MG tablet Take 1 tablet (1 mg total) by mouth daily. 90 tablet 3  . metroNIDAZOLE (METROGEL) 0.75 % gel Apply 1 application topically 2 (two) times daily.     . Multiple Vitamin (MULTIVITAMIN) capsule Take 1 capsule by mouth daily.     . Omega-3 Fatty Acids (FISH OIL PO) Take 2 capsules by mouth daily.     . ranitidine (ZANTAC) 150 MG tablet Take 1 tablet (150 mg total) by mouth 2 (two) times daily. 180 tablet 3   No current facility-administered medications on file prior to visit.     Review of Systems Constitutional: Negative for other unusual diaphoresis, sweats, appetite or weight changes HENT: Negative for other worsening hearing loss, ear pain, facial swelling, mouth sores or neck stiffness.   Eyes: Negative for other worsening pain, redness or other visual disturbance.  Respiratory: Negative for other stridor or swelling Cardiovascular: Negative for other palpitations or other chest pain  Gastrointestinal: Negative for worsening diarrhea or loose stools, blood in stool, distention or other pain Genitourinary: Negative for hematuria, flank pain or other change in urine volume.  Musculoskeletal: Negative for myalgias or other joint swelling.  Skin: Negative for other color change, or other wound or worsening drainage.  Neurological: Negative for other syncope or numbness. Hematological: Negative for other adenopathy or swelling Psychiatric/Behavioral: Negative for hallucinations, other worsening agitation, SI, self-injury, or new decreased concentration All other system neg per pt    Objective:   Physical Exam BP 126/84   Pulse 75   Ht 5\' 10"  (1.778 m)   SpO2 100%  VS noted, not ill appearing Constitutional: Pt is oriented to person, place, and time. Appears well-developed and well-nourished, in no significant distress and comfortable Head:  Normocephalic and atraumatic  Eyes: Conjunctivae and EOM are normal. Pupils are equal, round, and reactive to light Right Ear: External ear normal without discharge Left Ear: External ear normal without discharge Nose: Nose without discharge or deformity Mouth/Throat: Oropharynx is without other ulcerations and moist  Neck: Normal range of motion. Neck supple. No JVD present. No tracheal deviation present or significant neck LA but has right angle of jaw mass firm but mobile approx > 1 cm by estimate Cardiovascular: Normal rate, regular rhythm, normal heart sounds and intact distal pulses.   Pulmonary/Chest: WOB normal and breath sounds without rales or wheezing  Abdominal: Soft. Bowel sounds are normal. NT. No HSM  Musculoskeletal: Normal range of motion. Exhibits no edema Lymphadenopathy: Has no other cervical adenopathy.  Neurological: Pt is alert and oriented to person, place, and time. Pt has normal reflexes. No cranial nerve deficit. Motor grossly intact, Gait intact Skin: Skin is warm and dry. No rash noted or new ulcerations Psychiatric:  Has normal mood and affect. Behavior is normal without agitation  No other exam findings  Lab Results  Component Value Date   WBC 4.1 03/15/2016   HGB 13.4 03/15/2016   HCT 39.1 03/15/2016   PLT 205.0 03/15/2016   GLUCOSE 143 (H) 11/12/2016   CHOL 118 11/12/2016   TRIG 149.0 11/12/2016   HDL 37.40 (L) 11/12/2016   LDLDIRECT 51.0 03/15/2016   LDLCALC 50 11/12/2016   ALT 17 11/12/2016   AST 21 11/12/2016   NA 135 11/12/2016   K 4.7 11/12/2016   CL 99 11/12/2016   CREATININE 1.01 11/12/2016   BUN 22 11/12/2016   CO2 27 11/12/2016   TSH 1.64 03/15/2016   PSA 2.45 03/15/2016   INR 1.10 02/14/2009   HGBA1C 5.8 11/12/2016   MICROALBUR 173.4 (H) 11/12/2016      Assessment & Plan:

## 2016-11-16 NOTE — Assessment & Plan Note (Signed)
stable overall by history and exam, recent data reviewed with pt, and pt to continue medical treatment as before,  to f/u any worsening symptoms or concerns BP Readings from Last 3 Encounters:  11/13/16 126/84  10/07/16 138/80  07/15/16 (!) 154/84

## 2016-11-16 NOTE — Assessment & Plan Note (Signed)
?   Benign, but new I believe enlarged from previous, for ENT referral

## 2016-11-16 NOTE — Assessment & Plan Note (Signed)
Stable, cont same tx 

## 2016-11-16 NOTE — Assessment & Plan Note (Signed)
stable overall by history and exam, recent data reviewed with pt, and pt to continue medical treatment as before,  to f/u any worsening symptoms or concerns Lab Results  Component Value Date   HGBA1C 5.8 11/12/2016

## 2016-11-16 NOTE — Assessment & Plan Note (Signed)
stable overall by history and exam, recent data reviewed with pt, and pt to continue medical treatment as before,  to f/u any worsening symptoms or concerns Lab Results  Component Value Date   LDLCALC 50 11/12/2016   

## 2016-11-21 ENCOUNTER — Encounter: Payer: Self-pay | Admitting: Internal Medicine

## 2016-11-22 ENCOUNTER — Other Ambulatory Visit: Payer: Self-pay | Admitting: Internal Medicine

## 2016-11-22 MED ORDER — LOSARTAN POTASSIUM-HCTZ 100-25 MG PO TABS: 1.0000 | ORAL_TABLET | Freq: Every day | ORAL | 3 refills | Status: DC

## 2016-11-22 NOTE — Telephone Encounter (Signed)
Ok for shirron to let pt know;  I had to change his diovanHCT to Losartan HCT (which is similar) due to the recall of the diovanHCT product

## 2016-11-22 NOTE — Telephone Encounter (Signed)
Called pt, LVM  Parking placard form at front desk.

## 2016-11-26 DIAGNOSIS — K119 Disease of salivary gland, unspecified: Secondary | ICD-10-CM | POA: Diagnosis not present

## 2016-11-26 DIAGNOSIS — K118 Other diseases of salivary glands: Secondary | ICD-10-CM | POA: Insufficient documentation

## 2017-01-27 ENCOUNTER — Ambulatory Visit (INDEPENDENT_AMBULATORY_CARE_PROVIDER_SITE_OTHER)
Admission: RE | Admit: 2017-01-27 | Discharge: 2017-01-27 | Disposition: A | Discharge status: Home / Self Care | Payer: Medicare Other | Source: Ambulatory Visit | Attending: Surgery | Admitting: Surgery

## 2017-01-27 ENCOUNTER — Ambulatory Visit: Payer: Medicare Other | Admitting: Surgery

## 2017-01-27 ENCOUNTER — Ambulatory Visit (HOSPITAL_COMMUNITY)
Admission: RE | Admit: 2017-01-27 | Discharge: 2017-01-27 | Disposition: A | Discharge status: Home / Self Care | Payer: Medicare Other | Source: Ambulatory Visit | Attending: Surgery | Admitting: Surgery

## 2017-01-27 DIAGNOSIS — I739 Peripheral vascular disease, unspecified: Secondary | ICD-10-CM

## 2017-01-27 DIAGNOSIS — I724 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: Secondary | ICD-10-CM | POA: Diagnosis not present

## 2017-01-27 DIAGNOSIS — I6523 Occlusion and stenosis of bilateral carotid arteries: Secondary | ICD-10-CM

## 2017-03-03 ENCOUNTER — Ambulatory Visit (INDEPENDENT_AMBULATORY_CARE_PROVIDER_SITE_OTHER): Payer: Medicare Other | Admitting: Surgery

## 2017-03-03 ENCOUNTER — Encounter: Payer: Self-pay | Admitting: Surgery

## 2017-03-03 VITALS — BP 162/84 | HR 67 | Temp 97.0°F | Resp 18 | Ht 70.0 in | Wt 267.0 lb

## 2017-03-03 DIAGNOSIS — I739 Peripheral vascular disease, unspecified: Secondary | ICD-10-CM

## 2017-03-03 DIAGNOSIS — I6523 Occlusion and stenosis of bilateral carotid arteries: Secondary | ICD-10-CM | POA: Diagnosis not present

## 2017-03-03 DIAGNOSIS — I714 Abdominal aortic aneurysm, without rupture, unspecified: Secondary | ICD-10-CM

## 2017-03-03 DIAGNOSIS — K119 Disease of salivary gland, unspecified: Secondary | ICD-10-CM | POA: Diagnosis not present

## 2017-03-03 NOTE — Progress Notes (Signed)
[facility] [place]  Patient name: David [name] MRN: [id number] DOB: 12/25/1950 Sex: male   REASON FOR VISIT:    Follow up  HISOTRY OF PRESENT ILLNESS:   The patient comes back today for followup. He has undergone open abdominal aortic aneurysm repair by Dr. Amedeo Plenty in 2012. 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 He had his left popliteal aneurysm repaired by above-knee to below-knee bypass in October of 2010 he's had a midline abdominal hernia repair by Dr. gross. He had elevated velocities in the proximal anastomosis of his bypass graft and underwent arteriogram which revealed a high-grade stenosis extending into his bypass graft balloon angioplasty was performed however this resulted in a dissection and he therefore underwent stenting using a 6 mm stent. He was found to have in-stent stenosis on the left. On 01/12/2013 he underwent angiography with a successful balloon angioplasty of the stent using a 6 mm balloon. Therefore on 11/23/2014 he underwent drug coated balloon angioplasty of the left proximal anastomosis which has previously been stented. He also underwent balloon angioplasty of a left common iliac stenosis and angioplasty of the right common iliac stenosis.  He remains in atrial fibrillation which is treated with Eliquis. He is also on Plavix. He has no symptoms of claudication. He has had no neurologic symptoms.  He reports no new symptoms today.  PAST MEDICAL HISTORY:   Past Medical History:  Diagnosis Date  . AAA (abdominal aortic aneurysm) ([place])   . 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 Anticardiolipin antibody positive   . Anxiety   . ANXIETY 07/19/2008  . CEREBROVASCULAR ACCIDENT, HX OF 07/19/2008  . CVA (cerebral vascular accident) ([place]) 2003  . Depression   . DEPRESSION 07/19/2008  . Diabetes mellitus    type II  . DIABETES MELLITUS, TYPE II 01/18/2008  .  Diverticulosis   . GERD 07/19/2008  . GERD (gastroesophageal reflux disease)   . Hemorrhoids   . Hx of adenomatous colonic polyps   . Hyperlipidemia   . HYPERLIPIDEMIA 07/19/2008  . Hypertension   . HYPERTENSION 07/31/2009  . Increased prostate specific antigen (PSA) velocity 09/13/2010  . Internal hemorrhoids   . Perineal abscess   . Peripheral vascular disease ([place])    peripheral vascular disease/carotid artery <100%  . PERSONAL HX COLONIC POLYPS 12/10/2007  . Unspecified Peripheral Vascular Disease 01/18/2008  . Varicose veins      FAMILY HISTORY:   Family History  Problem Relation Age of Onset  . Aortic aneurysm Mother        desceding aoritic aneurysm  . Cancer Brother        Lung cancer  . Colon cancer Neg Hx   . Esophageal cancer Neg Hx   . Stomach cancer Neg Hx   . Rectal cancer Neg Hx     SOCIAL HISTORY:   Social History  Substance Use Topics  . Smoking status: Former Smoker    Types: Cigarettes    Quit date: 01/14/1992  . Smokeless tobacco: Never Used  . Alcohol use 1.2 oz/week    2 Glasses of wine per week     Comment: wine daily     ALLERGIES:   Allergies  Allergen Reactions  . Latex Rash     CURRENT MEDICATIONS:   Current Outpatient Prescriptions  Medication Sig Dispense Refill  . amLODipine (NORVASC) 5 MG tablet Take 1 tablet (5 mg total) by mouth daily. 90 tablet 3  . apixaban (  ELIQUIS) 5 MG TABS tablet Take 1 tablet (5 mg total) by mouth 2 (two) times daily. 180 tablet 3  . ascorbic Acid (VITAMIN C CR) 500 MG CPCR Take 500 mg by mouth daily.     Marland Kitchen atorvastatin (LIPITOR) 40 MG tablet Take 1 tablet (40 mg total) by mouth daily. 90 tablet 3  . cetirizine (ZYRTEC) 10 MG tablet Take 1 tablet (10 mg total) by mouth daily. 90 tablet 3  . clopidogrel (PLAVIX) 75 MG tablet Take 1 tablet (75 mg total) by mouth daily. 90 tablet 3  . docusate sodium (COLACE) 100 MG capsule Take 100 mg by mouth 2 (two) times daily.     . Fiber CAPS Take 5.7 mg by mouth  See admin instructions. Take (10) 0.57 capsules by mouth daily for regularity.    . folic acid (FOLVITE) 1 MG tablet Take 1 tablet (1 mg total) by mouth daily. 90 tablet 3  . gemfibrozil (LOPID) 600 MG tablet Take 2 tablets (1,200 mg total) by mouth daily. 180 tablet 3  . losartan-hydrochlorothiazide (HYZAAR) 100-25 MG tablet Take 1 tablet by mouth daily. 90 tablet 3  . metFORMIN (GLUCOPHAGE-XR) 500 MG 24 hr tablet Take 4 tablets (2,000 mg total) by mouth daily. 4 tabs by mouth in the AM 360 tablet 3  . metroNIDAZOLE (METROGEL) 0.75 % gel Apply 1 application topically 2 (two) times daily.     . Multiple Vitamin (MULTIVITAMIN) capsule Take 1 capsule by mouth daily.     . Omega-3 Fatty Acids (FISH OIL PO) Take 2 capsules by mouth daily.     . pioglitazone (ACTOS) 30 MG tablet Take 1 tablet (30 mg total) by mouth daily. 90 tablet 3  . ranitidine (ZANTAC) 150 MG tablet Take 1 tablet (150 mg total) by mouth 2 (two) times daily. 180 tablet 3   No current facility-administered medications for this visit.     REVIEW OF SYSTEMS:   [X]  denotes positive finding, [ ]  denotes negative finding Cardiac  Comments:  Chest pain or chest pressure:    Shortness of breath upon exertion:    Short of breath when lying flat:    Irregular heart rhythm:        Vascular    Pain in calf, thigh, or hip brought on by ambulation:    Pain in feet at night that wakes you up from your sleep:     Blood clot in your veins:    Leg swelling:         Pulmonary    Oxygen at home:    Productive cough:     Wheezing:         Neurologic    Sudden weakness in arms or legs:     Sudden numbness in arms or legs:     Sudden onset of difficulty speaking or slurred speech:    Temporary loss of vision in one eye:     Problems with dizziness:         Gastrointestinal    Blood in stool:     Vomited blood:         Genitourinary    Burning when urinating:     Blood in urine:        Psychiatric    Major depression:          Hematologic    Bleeding problems:    Problems with blood clotting too easily:        Skin    Rashes or ulcers:  Constitutional    Fever or chills:      PHYSICAL EXAM:   Vitals:   03/03/17 0910 03/03/17 0914  BP: (!) 167/80 (!) 162/84  Pulse: 67 67  Resp: 18   Temp: (!) 97 F (36.1 C)   SpO2: 100%   Weight: 267 lb (121.1 kg)   Height: 5\' 10"  (1.778 m)     GENERAL: The patient is a well-nourished male, in no acute distress. The vital signs are documented above. CARDIAC: There is a regular rate and rhythm.  PULMONARY: Non-labored respirations MUSCULOSKELETAL: There are no major deformities or cyanosis. NEUROLOGIC: No focal weakness or paresthesias are detected. SKIN: There are no ulcers or rashes noted. PSYCHIATRIC: The patient has a normal affect.  STUDIES:   I have reviewed his vascular lab studies as follows: ABI-right equals 1.0, left equals 0.92.... No change from prior study Carotid duplex: Right side shows 1-39 percent.  Known left occlusion Left leg bypass: Velocity is through the stent suggesting 1-49 percent stenosis.  No significant change from 07/01/2016  MEDICAL ISSUES:   Abdominal aortic aneurysm: This will need to be imaged in approximately 2020  Lower extremity bypass graft:  Patient will be scheduled for follow-up in 9 months  Right leg stenting: Patient was scheduled for follow-up in 9 months  Carotid stenosis: No significant change on today's ultrasound.  I will not have this imaged in 9 months but rather at his first visit following that   [name], MD [facility] [phone] Pager [phone]

## 2017-03-13 NOTE — Addendum Note (Signed)
Addended by: Lianne Cure A on: 03/13/2017 12:30 PM   Modules accepted: Orders

## 2017-05-20 ENCOUNTER — Ambulatory Visit: Payer: Medicare Other | Admitting: Internal Medicine

## 2017-06-11 ENCOUNTER — Encounter: Payer: Self-pay | Admitting: Internal Medicine

## 2017-06-11 NOTE — Telephone Encounter (Signed)
Pt has hx of CVA and some cognitive difficulty  Please let pt know that although we do not have open appt for Wed, but we can see him soon (such as a same day appt on a day close to that);   we could actually see him Wed, but I could only work in if has a significant illness or issue that is time sensitive  Also, it appears traditional medicare is his primary insurance, and I am not allowed to have labs done prior to the visit as medicare does not pay

## 2017-06-18 ENCOUNTER — Ambulatory Visit (INDEPENDENT_AMBULATORY_CARE_PROVIDER_SITE_OTHER): Payer: Medicare Other | Admitting: Internal Medicine

## 2017-06-18 ENCOUNTER — Other Ambulatory Visit: Payer: Medicare Other

## 2017-06-18 ENCOUNTER — Encounter: Payer: Self-pay | Admitting: Internal Medicine

## 2017-06-18 VITALS — BP 142/88 | HR 75 | Temp 97.7°F | Ht 70.0 in | Wt 269.0 lb

## 2017-06-18 DIAGNOSIS — R809 Proteinuria, unspecified: Secondary | ICD-10-CM | POA: Diagnosis not present

## 2017-06-18 DIAGNOSIS — E785 Hyperlipidemia, unspecified: Secondary | ICD-10-CM

## 2017-06-18 DIAGNOSIS — I4819 Other persistent atrial fibrillation: Secondary | ICD-10-CM

## 2017-06-18 DIAGNOSIS — E1149 Type 2 diabetes mellitus with other diabetic neurological complication: Secondary | ICD-10-CM

## 2017-06-18 DIAGNOSIS — I10 Essential (primary) hypertension: Secondary | ICD-10-CM

## 2017-06-18 DIAGNOSIS — J309 Allergic rhinitis, unspecified: Secondary | ICD-10-CM

## 2017-06-18 DIAGNOSIS — I481 Persistent atrial fibrillation: Secondary | ICD-10-CM | POA: Diagnosis not present

## 2017-06-18 MED ORDER — CLOPIDOGREL BISULFATE 75 MG PO TABS: 75.0000 mg | ORAL_TABLET | Freq: Every day | ORAL | 3 refills | Status: DC

## 2017-06-18 MED ORDER — GEMFIBROZIL 600 MG PO TABS: 1200.0000 mg | ORAL_TABLET | Freq: Every day | ORAL | 3 refills | Status: DC

## 2017-06-18 MED ORDER — AMLODIPINE BESYLATE 5 MG PO TABS: 5.0000 mg | ORAL_TABLET | Freq: Every day | ORAL | 3 refills | Status: DC

## 2017-06-18 MED ORDER — TRIAMCINOLONE ACETONIDE 55 MCG/ACT NA AERO: 2.0000 | INHALATION_SPRAY | Freq: Every day | NASAL | 12 refills | Status: AC

## 2017-06-18 MED ORDER — APIXABAN 5 MG PO TABS: 5.0000 mg | ORAL_TABLET | Freq: Two times a day (BID) | ORAL | 3 refills | Status: DC

## 2017-06-18 MED ORDER — ZOSTER VAC RECOMB ADJUVANTED 50 MCG/0.5ML IM SUSR: 0.5000 mL | Freq: Once | INTRAMUSCULAR | 1 refills | Status: AC

## 2017-06-18 MED ORDER — LOSARTAN POTASSIUM-HCTZ 100-25 MG PO TABS: 1.0000 | ORAL_TABLET | Freq: Every day | ORAL | 3 refills | Status: DC

## 2017-06-18 MED ORDER — PIOGLITAZONE HCL 30 MG PO TABS: 30.0000 mg | ORAL_TABLET | Freq: Every day | ORAL | 3 refills | Status: DC

## 2017-06-18 MED ORDER — METFORMIN HCL ER 500 MG PO TB24: 2000.0000 mg | ORAL_TABLET | Freq: Every day | ORAL | 3 refills | Status: DC

## 2017-06-18 MED ORDER — ATORVASTATIN CALCIUM 40 MG PO TABS: 40.0000 mg | ORAL_TABLET | Freq: Every day | ORAL | 3 refills | Status: DC

## 2017-06-18 MED ORDER — OLMESARTAN MEDOXOMIL-HCTZ 40-25 MG PO TABS: 1.0000 | ORAL_TABLET | Freq: Every day | ORAL | 3 refills | Status: DC

## 2017-06-18 NOTE — Assessment & Plan Note (Signed)
Uncontrolled , o/w stable overall by history and exam, recent data reviewed with pt, and pt to change losartan hct to benicar HCT,  to f/u any worsening symptoms or concerns BP Readings from Last 3 Encounters:  06/18/17 (!) 142/88  03/03/17 (!) 162/84  11/13/16 126/84

## 2017-06-18 NOTE — Assessment & Plan Note (Signed)
Ok to add nasacort asd,  to f/u any worsening symptoms or concerns

## 2017-06-18 NOTE — Progress Notes (Signed)
Subjective:    Patient ID: David [name], male    DOB: 1950/07/15, 67 y.o.   MRN: [id number]  HPI  Here to f/u; overall doing ok,  Pt denies chest pain, increasing sob or doe, wheezing, orthopnea, PND, increased LE swelling, palpitations, dizziness or syncope.  Pt denies new neurological symptoms such as new headache, or facial or extremity weakness or numbness.  Pt denies polydipsia, polyuria, or low sugar episode.  Pt states overall good compliance with meds, mostly trying to follow appropriate diet, but little exercise however. Due for eye exam soon and plans to call himself.  Declines flu shot.  Overall BP not was well controlled with change of recalled med to losartan hct. Also - Does have several wks ongoing nasal allergy symptoms with clearish congestion, itch and sneezing, without fever, pain, ST, cough, swelling or wheezing, not well controlled with zyrtec only BP Readings from Last 3 Encounters:  06/18/17 (!) 142/88  03/03/17 (!) 162/84  11/13/16 126/84   Wt Readings from Last 3 Encounters:  06/18/17 269 lb (122 kg)  03/03/17 267 lb (121.1 kg)  10/07/16 270 lb 6.4 oz (122.7 kg)   Past Medical History:  Diagnosis Date  . AAA (abdominal aortic aneurysm) ([place])   . 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 Anticardiolipin antibody positive   . Anxiety   . ANXIETY 07/19/2008  . CEREBROVASCULAR ACCIDENT, HX OF 07/19/2008  . CVA (cerebral vascular accident) ([place]) 2003  . Depression   . DEPRESSION 07/19/2008  . Diabetes mellitus    type II  . DIABETES MELLITUS, TYPE II 01/18/2008  . Diverticulosis   . GERD 07/19/2008  . GERD (gastroesophageal reflux disease)   . Hemorrhoids   . Hx of adenomatous colonic polyps   . Hyperlipidemia   . HYPERLIPIDEMIA 07/19/2008  . Hypertension   . HYPERTENSION 07/31/2009  . Increased prostate specific antigen (PSA) velocity 09/13/2010  . Internal hemorrhoids   . Perineal abscess   . Peripheral vascular disease ([place])    peripheral  vascular disease/carotid artery <100%  . PERSONAL HX COLONIC POLYPS 12/10/2007  . Unspecified Peripheral Vascular Disease 01/18/2008  . Varicose veins    Past Surgical History:  Procedure Laterality Date  . 7 abdominal hernia repairs  05/31/10  . ABDOMINAL AORTAGRAM N/A 01/12/2013   Procedure: ABDOMINAL Maxcine Ham;  Surgeon: Serafina Mitchell, MD;  Location: [facility] CATH LAB;  Service: Cardiovascular;  Laterality: N/A;  . ABDOMINAL AORTIC ANEURYSM REPAIR    . COLONOSCOPY    . hernia surgury     67 years old  . perineal abscess    . PERIPHERAL VASCULAR CATHETERIZATION N/A 11/23/2014   Procedure: Abdominal Aortogram;  Surgeon: Serafina Mitchell, MD;  Location: [facility] CV LAB;  Service: Cardiovascular;  Laterality: N/A;  . PERIPHERAL VASCULAR CATHETERIZATION  11/23/2014   Procedure: Lower Extremity Angiography;  Surgeon: Serafina Mitchell, MD;  Location: [facility] CV LAB;  Service: Cardiovascular;;  . PERIPHERAL VASCULAR CATHETERIZATION  11/23/2014   Procedure: Peripheral Vascular Intervention;  Surgeon: Serafina Mitchell, MD;  Location: [facility] CV LAB;  Service: Cardiovascular;;  PTA bilateral common iliac PTA/DCB left fem-pop  . POLYPECTOMY    . 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 SP ENDO REPAIR INFRAREN AAA      reports that he quit smoking about 25 years ago. His smoking use included cigarettes. he has never used smokeless tobacco. He reports that he drinks about 1.2 oz of alcohol  per week. He reports that he does not use drugs. family history includes Aortic aneurysm in his mother; Cancer in his brother. Allergies  Allergen Reactions  . Latex Rash   Current Outpatient Medications on File Prior to Visit  Medication Sig Dispense Refill  . ascorbic Acid (VITAMIN C CR) 500 MG CPCR Take 500 mg by mouth daily.     . cetirizine (ZYRTEC) 10 MG tablet Take 1 tablet (10 mg total) by mouth daily. 90 tablet 3  . docusate sodium (COLACE) 100 MG capsule Take 100 mg by mouth 2 (two) times  daily.     . Fiber CAPS Take 5.7 mg by mouth See admin instructions. Take (10) 0.57 capsules by mouth daily for regularity.    . folic acid (FOLVITE) 1 MG tablet Take 1 tablet (1 mg total) by mouth daily. 90 tablet 3  . metroNIDAZOLE (METROGEL) 0.75 % gel Apply 1 application topically 2 (two) times daily.     . Multiple Vitamin (MULTIVITAMIN) capsule Take 1 capsule by mouth daily.     . Omega-3 Fatty Acids (FISH OIL PO) Take 2 capsules by mouth daily.     . ranitidine (ZANTAC) 150 MG tablet Take 1 tablet (150 mg total) by mouth 2 (two) times daily. 180 tablet 3   No current facility-administered medications on file prior to visit.    Review of Systems  Constitutional: Negative for other unusual diaphoresis or sweats HENT: Negative for ear discharge or swelling Eyes: Negative for other worsening visual disturbances Respiratory: Negative for stridor or other swelling  Gastrointestinal: Negative for worsening distension or other blood Genitourinary: Negative for retention or other urinary change Musculoskeletal: Negative for other MSK pain or swelling Skin: Negative for color change or other new lesions Neurological: Negative for worsening tremors and other numbness  Psychiatric/Behavioral: Negative for worsening agitation or other fatigue' All other system neg per pt Objective:   Physical Exam BP (!) 142/88   Pulse 75   Temp 97.7 F (36.5 C) (Oral)   Ht 5\' 10"  (1.778 m)   Wt 269 lb (122 kg)   SpO2 97%   BMI 38.60 kg/m  VS noted,  Constitutional: Pt appears in NAD HENT: Head: NCAT.  Right Ear: External ear normal.  Left Ear: External ear normal.  Eyes: . Pupils are equal, round, and reactive to light. Conjunctivae and EOM are normal Nose: without d/c or deformity Neck: Neck supple. Gross normal ROM Cardiovascular: Normal rate and regular rhythm.   Pulmonary/Chest: Effort normal and breath sounds without rales or wheezing.  Abd:  Soft, NT, ND, + BS, no  organomegaly Neurological: Pt is alert. At baseline orientation, motor grossly intact Skin: Skin is warm. No rashes, other new lesions, no LE edema Psychiatric: Pt behavior is normal without agitation ] No other exam findings Lab Results  Component Value Date   WBC 4.1 03/15/2016   HGB 13.4 03/15/2016   HCT 39.1 03/15/2016   PLT 205.0 03/15/2016   GLUCOSE 143 (H) 11/12/2016   CHOL 118 11/12/2016   TRIG 149.0 11/12/2016   HDL 37.40 (L) 11/12/2016   LDLDIRECT 51.0 03/15/2016   LDLCALC 50 11/12/2016   ALT 17 11/12/2016   AST 21 11/12/2016   NA 135 11/12/2016   K 4.7 11/12/2016   CL 99 11/12/2016   CREATININE 1.01 11/12/2016   BUN 22 11/12/2016   CO2 27 11/12/2016   TSH 1.64 03/15/2016   PSA 2.45 03/15/2016   INR 1.10 02/14/2009   HGBA1C 5.8 11/12/2016  MICROALBUR 173.4 (H) 11/12/2016       Assessment & Plan:

## 2017-06-18 NOTE — Assessment & Plan Note (Signed)
stable overall by history and exam, recent data reviewed with pt, and pt to continue medical treatment as before,  to f/u any worsening symptoms or concerns Lab Results  Component Value Date   LDLCALC 50 11/12/2016

## 2017-06-18 NOTE — Assessment & Plan Note (Signed)
Overall remains in good control, consider decrease actos but no low sugars and pt wants to continue same tx,,  to f/u any worsening symptoms or concerns, for a1c with lab today

## 2017-06-18 NOTE — Assessment & Plan Note (Signed)
[place] for change ARB HCT today , for 24 urine protein

## 2017-06-18 NOTE — Patient Instructions (Addendum)
Your shingles shot was sent to the pharmacy  OK to stop the losartan HCT  Please take all new medication as prescribed - the generic for Benicar HCT  Please take all new medication as prescribed - the nasacort  Please continue all other medications as before, and refills have been done if requested.  Please have the pharmacy call with any other refills you may need.  Please continue your efforts at being more active, low cholesterol diabetic diet, and weight control.  Please keep your appointments with your specialists as you may have planned  Please go to the LAB in the Basement (turn left off the elevator) for the tests to be done today  You will be contacted by phone if any changes need to be made immediately.  Otherwise, you will receive a letter about your results with an explanation, but please check with MyChart first.  Please remember to sign up for MyChart if you have not done so, as this will be important to you in the future with finding out test results, communicating by private email, and scheduling acute appointments online when needed.  Please return in 6 months, or sooner if needed

## 2017-06-18 NOTE — Assessment & Plan Note (Signed)
Stable,, cont apixiban

## 2017-06-26 ENCOUNTER — Other Ambulatory Visit (INDEPENDENT_AMBULATORY_CARE_PROVIDER_SITE_OTHER): Payer: Medicare Other

## 2017-06-26 DIAGNOSIS — E1149 Type 2 diabetes mellitus with other diabetic neurological complication: Secondary | ICD-10-CM | POA: Diagnosis not present

## 2017-06-26 DIAGNOSIS — R809 Proteinuria, unspecified: Secondary | ICD-10-CM

## 2017-06-26 LAB — LIPID PANEL
CHOLESTEROL: 121 mg/dL (ref 0–200)
HDL: 33.6 mg/dL — ABNORMAL LOW (ref >39.00)
LDL CALC: 51 mg/dL (ref 0–99)
NonHDL: 87.2
Total CHOL/HDL Ratio: 4
Triglycerides: 182 mg/dL — ABNORMAL HIGH (ref 0.0–149.0)
VLDL: 36.4 mg/dL (ref 0.0–40.0)

## 2017-06-26 LAB — CBC WITH DIFFERENTIAL/PLATELET
BASOS ABS: 0.1 10*3/uL (ref 0.0–0.1)
Basophils Relative: 1 % (ref 0.0–3.0)
EOS ABS: 0.4 10*3/uL (ref 0.0–0.7)
Eosinophils Relative: 4.5 % (ref 0.0–5.0)
HEMATOCRIT: 38.9 % — AB (ref 39.0–52.0)
HEMOGLOBIN: 13.3 g/dL (ref 13.0–17.0)
LYMPHS PCT: 10.7 % — AB (ref 12.0–46.0)
Lymphs Abs: 1 10*3/uL (ref 0.7–4.0)
MCHC: 34.2 g/dL (ref 30.0–36.0)
MCV: 95 fl (ref 78.0–100.0)
MONOS PCT: 7.9 % (ref 3.0–12.0)
Monocytes Absolute: 0.7 10*3/uL (ref 0.1–1.0)
Neutro Abs: 7.1 10*3/uL (ref 1.4–7.7)
Neutrophils Relative %: 75.9 % (ref 43.0–77.0)
Platelets: 263 10*3/uL (ref 150.0–400.0)
RBC: 4.1 Mil/uL — ABNORMAL LOW (ref 4.22–5.81)
RDW: 12.5 % (ref 11.5–15.5)
WBC: 9.4 10*3/uL (ref 4.0–10.5)

## 2017-06-26 LAB — HEPATIC FUNCTION PANEL
ALK PHOS: 37 U/L — AB (ref 39–117)
ALT: 18 U/L (ref 0–53)
AST: 20 U/L (ref 0–37)
Albumin: 4 g/dL (ref 3.5–5.2)
BILIRUBIN DIRECT: 0.1 mg/dL (ref 0.0–0.3)
TOTAL PROTEIN: 7.9 g/dL (ref 6.0–8.3)
Total Bilirubin: 0.6 mg/dL (ref 0.2–1.2)

## 2017-06-26 LAB — BASIC METABOLIC PANEL
BUN: 19 mg/dL (ref 6–23)
CALCIUM: 10 mg/dL (ref 8.4–10.5)
CHLORIDE: 99 meq/L (ref 96–112)
CO2: 28 mEq/L (ref 19–32)
CREATININE: 0.97 mg/dL (ref 0.40–1.50)
GFR: 81.63 mL/min (ref >60.00)
Glucose, Bld: 145 mg/dL — ABNORMAL HIGH (ref 70–99)
Potassium: 4 mEq/L (ref 3.5–5.1)
Sodium: 136 mEq/L (ref 135–145)

## 2017-06-26 LAB — HEMOGLOBIN A1C: HEMOGLOBIN A1C: 6.4 % (ref 4.6–6.5)

## 2017-06-27 ENCOUNTER — Telehealth: Payer: Self-pay

## 2017-06-27 ENCOUNTER — Other Ambulatory Visit: Payer: Self-pay | Admitting: Internal Medicine

## 2017-06-27 DIAGNOSIS — R809 Proteinuria, unspecified: Secondary | ICD-10-CM

## 2017-06-27 LAB — PROTEIN, URINE, 24 HOUR: Protein, 24H Urine: 6188 mg/24 h — ABNORMAL HIGH (ref 0–149)

## 2017-06-27 LAB — EXTRA URINE SPECIMEN

## 2017-06-27 NOTE — Telephone Encounter (Signed)
Pt has been informed and expressed understanding.  

## 2017-06-27 NOTE — Telephone Encounter (Signed)
-----   Message from Biagio Borg, MD sent at 06/27/2017 12:19 PM EST ----- Left message on MyChart, pt to cont same tx except  The test results show that your current treatment is OK, except there is evidence for a large amount of protein.  We should look into this further with a referral to Nephrology.  You should hear about this hopefully soon.Redmond Baseman to please inform pt, I will do referral

## 2017-07-10 ENCOUNTER — Telehealth: Payer: Self-pay | Admitting: Internal Medicine

## 2017-07-10 DIAGNOSIS — E1149 Type 2 diabetes mellitus with other diabetic neurological complication: Secondary | ICD-10-CM

## 2017-07-10 DIAGNOSIS — N183 Chronic kidney disease, stage 3 unspecified: Secondary | ICD-10-CM

## 2017-07-10 NOTE — Telephone Encounter (Signed)
Dr. Jenny Reichmann which test should I order? please advise.

## 2017-07-10 NOTE — Telephone Encounter (Signed)
Test is ordered, please let pt know to come for blood testing at lab

## 2017-07-10 NOTE — Addendum Note (Signed)
Addended by: Biagio Borg on: 07/10/2017 06:01 PM   Modules accepted: Orders

## 2017-07-10 NOTE — Telephone Encounter (Signed)
Regarding nephrology referral: Dr. Moshe Cipro at [facility] would like pt to have a spot prot/creat ratio done before they schedule an appointment.  [place]: can you please call pt regarding this when order is placed? thanks

## 2017-07-11 NOTE — Telephone Encounter (Signed)
Called pt, left detailed Vm with info below.

## 2017-07-11 NOTE — Telephone Encounter (Signed)
Noted  

## 2017-07-11 NOTE — Telephone Encounter (Signed)
Patient returned call and will come in on Tuesday or Wednesday for blood draw only.

## 2017-07-17 ENCOUNTER — Other Ambulatory Visit: Payer: Medicare Other

## 2017-07-17 ENCOUNTER — Other Ambulatory Visit: Payer: Self-pay | Admitting: Emergency Medicine

## 2017-07-17 DIAGNOSIS — N183 Chronic kidney disease, stage 3 unspecified: Secondary | ICD-10-CM

## 2017-07-18 LAB — PROT/CREAT RATIO, SERUM
Creatinine, Ser: 1.08 mg/dL (ref 0.76–1.27)
GFR calc Af Amer: 81 mL/min/{1.73_m2} (ref >59)
GFR, EST NON AFRICAN AMERICAN: 70 mL/min/{1.73_m2} (ref >59)
Prot/Creat Ratio, S: 6.57
TOTAL PROTEIN: 7.1 g/dL (ref 6.0–8.5)

## 2017-07-21 NOTE — Addendum Note (Signed)
Addended by: Juliet Rude on: 07/21/2017 02:50 PM   Modules accepted: Orders

## 2017-07-21 NOTE — Telephone Encounter (Signed)
Spoke with pt's wife and she will let the patient know to have urine test done.

## 2017-07-21 NOTE — Telephone Encounter (Signed)
Received a vm from [employer] at [facility] stating they were wanting a urine spot pro/breat ration. Can you please enter order and let pt know.  Thank you

## 2017-07-25 ENCOUNTER — Other Ambulatory Visit: Payer: Medicare Other

## 2017-07-25 DIAGNOSIS — E1149 Type 2 diabetes mellitus with other diabetic neurological complication: Secondary | ICD-10-CM | POA: Diagnosis not present

## 2017-07-25 DIAGNOSIS — N183 Chronic kidney disease, stage 3 unspecified: Secondary | ICD-10-CM

## 2017-07-26 LAB — PROTEIN / CREATININE RATIO, URINE
CREATININE, URINE: 34 mg/dL (ref 20–320)
PROTEIN/CREAT RATIO: 4765 mg/g{creat} — AB (ref 22–128)
TOTAL PROTEIN, URINE: 162 mg/dL — AB (ref 5–25)

## 2017-08-06 DIAGNOSIS — R809 Proteinuria, unspecified: Secondary | ICD-10-CM | POA: Diagnosis not present

## 2017-08-06 DIAGNOSIS — I10 Essential (primary) hypertension: Secondary | ICD-10-CM | POA: Diagnosis not present

## 2017-08-07 ENCOUNTER — Other Ambulatory Visit: Payer: Self-pay | Admitting: Nephrology

## 2017-08-07 DIAGNOSIS — R809 Proteinuria, unspecified: Secondary | ICD-10-CM

## 2017-08-07 DIAGNOSIS — I129 Hypertensive chronic kidney disease with stage 1 through stage 4 chronic kidney disease, or unspecified chronic kidney disease: Secondary | ICD-10-CM

## 2017-08-19 ENCOUNTER — Telehealth: Payer: Self-pay | Admitting: Hematology

## 2017-08-19 ENCOUNTER — Encounter: Payer: Self-pay | Admitting: Hematology

## 2017-08-19 DIAGNOSIS — H2513 Age-related nuclear cataract, bilateral: Secondary | ICD-10-CM | POA: Diagnosis not present

## 2017-08-19 DIAGNOSIS — H25043 Posterior subcapsular polar age-related cataract, bilateral: Secondary | ICD-10-CM | POA: Diagnosis not present

## 2017-08-19 DIAGNOSIS — H5213 Myopia, bilateral: Secondary | ICD-10-CM | POA: Diagnosis not present

## 2017-08-19 DIAGNOSIS — E119 Type 2 diabetes mellitus without complications: Secondary | ICD-10-CM | POA: Diagnosis not present

## 2017-08-19 LAB — HM DIABETES EYE EXAM

## 2017-08-19 NOTE — Telephone Encounter (Signed)
Appt has been scheduled for the pt to see Dr. Irene Limbo on 5/7 at 10am. Pt has agreed to the appt date and time. Letter mailed.

## 2017-08-25 ENCOUNTER — Ambulatory Visit
Admission: RE | Admit: 2017-08-25 | Discharge: 2017-08-25 | Disposition: A | Discharge status: Home / Self Care | Payer: Medicare Other | Source: Ambulatory Visit | Attending: Nephrology | Admitting: Nephrology

## 2017-08-25 DIAGNOSIS — R3129 Other microscopic hematuria: Secondary | ICD-10-CM | POA: Diagnosis not present

## 2017-08-25 DIAGNOSIS — I129 Hypertensive chronic kidney disease with stage 1 through stage 4 chronic kidney disease, or unspecified chronic kidney disease: Secondary | ICD-10-CM

## 2017-08-25 DIAGNOSIS — R809 Proteinuria, unspecified: Secondary | ICD-10-CM

## 2017-09-03 ENCOUNTER — Other Ambulatory Visit (HOSPITAL_COMMUNITY): Payer: Self-pay | Admitting: Nephrology

## 2017-09-03 DIAGNOSIS — R809 Proteinuria, unspecified: Secondary | ICD-10-CM

## 2017-09-05 ENCOUNTER — Telehealth: Payer: Self-pay

## 2017-09-05 NOTE — Telephone Encounter (Signed)
Ok for off plavix x 5 days prior, and eliquis 2 days prior to procedure

## 2017-09-05 NOTE — Telephone Encounter (Signed)
Copied from [place]. Topic: General - Other >> Sep 05, 2017 10:48 AM Yvette Rack wrote: Reason for CRM: Tasha with [facility] scheduling called in requesting a call back regarding a hold for clopidogrel (PLAVIX) 75 MG tablet and apixaban (ELIQUIS) 5 MG TABS tablet.   Cb# [phone]

## 2017-09-08 ENCOUNTER — Other Ambulatory Visit: Payer: Self-pay | Admitting: Internal Medicine

## 2017-09-08 ENCOUNTER — Encounter: Payer: Self-pay | Admitting: Internal Medicine

## 2017-09-08 MED ORDER — HYDROCHLOROTHIAZIDE 25 MG PO TABS: 25.0000 mg | ORAL_TABLET | Freq: Every day | ORAL | 2 refills | Status: DC

## 2017-09-08 MED ORDER — OLMESARTAN MEDOXOMIL 40 MG PO TABS: 40.0000 mg | ORAL_TABLET | Freq: Every day | ORAL | 2 refills | Status: DC

## 2017-09-08 NOTE — Telephone Encounter (Signed)
Ok for change benicar hct to  benicar 40 and HCT 25 qd (as former is on back order)

## 2017-09-08 NOTE — Telephone Encounter (Signed)
David [name] has been informed.

## 2017-09-09 ENCOUNTER — Telehealth: Payer: Self-pay

## 2017-09-09 ENCOUNTER — Inpatient Hospital Stay: Payer: Medicare Other | Attending: Hematology | Admitting: Hematology

## 2017-09-09 ENCOUNTER — Encounter: Payer: Self-pay | Admitting: Hematology

## 2017-09-09 VITALS — BP 156/68 | HR 64 | Temp 98.3°F | Resp 17 | Ht 70.0 in | Wt 268.5 lb

## 2017-09-09 DIAGNOSIS — I4891 Unspecified atrial fibrillation: Secondary | ICD-10-CM

## 2017-09-09 DIAGNOSIS — Z87891 Personal history of nicotine dependence: Secondary | ICD-10-CM

## 2017-09-09 DIAGNOSIS — F329 Major depressive disorder, single episode, unspecified: Secondary | ICD-10-CM | POA: Diagnosis not present

## 2017-09-09 DIAGNOSIS — Z7984 Long term (current) use of oral hypoglycemic drugs: Secondary | ICD-10-CM | POA: Diagnosis not present

## 2017-09-09 DIAGNOSIS — F419 Anxiety disorder, unspecified: Secondary | ICD-10-CM

## 2017-09-09 DIAGNOSIS — E1151 Type 2 diabetes mellitus with diabetic peripheral angiopathy without gangrene: Secondary | ICD-10-CM

## 2017-09-09 DIAGNOSIS — Z79899 Other long term (current) drug therapy: Secondary | ICD-10-CM | POA: Diagnosis not present

## 2017-09-09 DIAGNOSIS — I10 Essential (primary) hypertension: Secondary | ICD-10-CM | POA: Diagnosis not present

## 2017-09-09 DIAGNOSIS — D472 Monoclonal gammopathy: Secondary | ICD-10-CM

## 2017-09-09 DIAGNOSIS — Z7901 Long term (current) use of anticoagulants: Secondary | ICD-10-CM

## 2017-09-09 DIAGNOSIS — N049 Nephrotic syndrome with unspecified morphologic changes: Secondary | ICD-10-CM

## 2017-09-09 NOTE — Progress Notes (Signed)
HEMATOLOGY/ONCOLOGY CONSULTATION NOTE  Date of Service: 09/09/2017  Patient Care Team: Biagio Borg, MD as PCP - General Angelena Form Annita Brod, MD as Consulting Physician (Cardiology) Ladene Artist, MD as Consulting Physician (Gastroenterology)  CHIEF COMPLAINTS/PURPOSE OF CONSULTATION:  Elevated Kappa Light Chain  HISTORY OF PRESENTING ILLNESS:   David [name] is a wonderful 67 y.o. male who has been referred to [place] by  Dr. Lawson Radar for evaluation and management of his elevated kappa light chains in the setting of nephrotic range proteinuria.   Patient has a history of diabetes since 2009 with his last hemoglobin A1c 6.4, uncontrolled hypertension in February 2019, atrial fibrillation on Eliquis, AAA, severe peripheral vascular disease status post bypass surgery to both legs, hypertension, CVA, anxiety, depression who is primarily followed by Dr. Cathlean Cower.  He apparently has had low-grade proteinuria since 2016 with a urine microalbumin/creatinine ratio of 214 in July 2018.  He has had chronic lower extremity edema from his peripheral vascular disease.  He was recently noted to have significant increase in proteinuria and was referred to nephrology for further evaluation.  He was being seen by his nephrologist Dr. Lawson Radar who wanted to do further workup to find why his urine had so much protein and what is making his kidney's leak these proteins. He had a [place] of kidney on 08/25/17 as well. He notes he has a kidney biopsy on 10/16/17.  He was noted to have nephrotic range proteinuria on a spot urine study and it was unclear if this was related to diabetic nephropathy versus other glomerular disease. Urine protein creatinine ratio was noted to be 7204. Creatinine was stable at 1.03.  SPEP done showed no M spike Serum free light chains showed free kappa light chains of 52.5 and free lambda light chains of 23.6 with a kappa lambda ratio slightly elevated at 2.22.  ANA  was negative, C3 and C4 complements not reduced.  Hepatitis B surface antigen negative. He has 3 aneurysms which results in sten placement in right leg and AAA in 2010 and a bypass graph of left leg a year or so later.   On review of symptoms, pt denies any new changes over the past 6-12 months. He notes no new leg swelling. The LE swelling he does have is chronic, due to his peripheral arterial disease status post surgery. He knows how to manage this with compression socks and leg elevation. He denies any prior heart conditions, SOB or trouble breathing. Pt denies back or flank pain.     MEDICAL HISTORY:  Past Medical History:  Diagnosis Date  . AAA (abdominal aortic aneurysm) ([place])   . 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 ([place]) 09/07/2008  . Anticardiolipin antibody positive   . Anxiety   . ANXIETY 07/19/2008  . CEREBROVASCULAR ACCIDENT, HX OF 07/19/2008  . CVA (cerebral vascular accident) ([place]) 2003  . Depression   . DEPRESSION 07/19/2008  . Diabetes mellitus    type II  . DIABETES MELLITUS, TYPE II 01/18/2008  . Diverticulosis   . GERD 07/19/2008  . GERD (gastroesophageal reflux disease)   . Hemorrhoids   . Hx of adenomatous colonic polyps   . Hyperlipidemia   . HYPERLIPIDEMIA 07/19/2008  . Hypertension   . HYPERTENSION 07/31/2009  . Increased prostate specific antigen (PSA) velocity 09/13/2010  . Internal hemorrhoids   . Perineal abscess   . Peripheral vascular disease ([place])    peripheral vascular disease/carotid artery <100%  . PERSONAL HX COLONIC  POLYPS 12/10/2007  . Unspecified Peripheral Vascular Disease 01/18/2008  . Varicose veins     SURGICAL HISTORY: Past Surgical History:  Procedure Laterality Date  . 7 abdominal hernia repairs  05/31/10  . ABDOMINAL AORTAGRAM N/A 01/12/2013   Procedure: ABDOMINAL Maxcine Ham;  Surgeon: Serafina Mitchell, MD;  Location: [facility] CATH LAB;  Service: Cardiovascular;  Laterality: N/A;  . ABDOMINAL AORTIC ANEURYSM REPAIR    . COLONOSCOPY    . hernia  surgury     67 years old  . perineal abscess    . PERIPHERAL VASCULAR CATHETERIZATION N/A 11/23/2014   Procedure: Abdominal Aortogram;  Surgeon: Serafina Mitchell, MD;  Location: [facility] CV LAB;  Service: Cardiovascular;  Laterality: N/A;  . PERIPHERAL VASCULAR CATHETERIZATION  11/23/2014   Procedure: Lower Extremity Angiography;  Surgeon: Serafina Mitchell, MD;  Location: [facility] CV LAB;  Service: Cardiovascular;;  . PERIPHERAL VASCULAR CATHETERIZATION  11/23/2014   Procedure: Peripheral Vascular Intervention;  Surgeon: Serafina Mitchell, MD;  Location: [place] CV LAB;  Service: Cardiovascular;;  PTA bilateral common iliac PTA/DCB left fem-pop  . POLYPECTOMY    . 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 SP ENDO REPAIR INFRAREN AAA      SOCIAL HISTORY: Social History   Socioeconomic History  . Marital status: Married    Spouse name: Not on file  . Number of children: 1  . Years of education: Not on file  . Highest education level: Not on file  Occupational History  . Occupation: dsiabled due to stroke    Employer: RETIRED  Social Needs  . Financial resource strain: Not on file  . Food insecurity:    Worry: Not on file    Inability: Not on file  . Transportation needs:    Medical: Not on file    Non-medical: Not on file  Tobacco Use  . Smoking status: Former Smoker    Types: Cigarettes    Last attempt to quit: 01/14/1992    Years since quitting: 25.6  . Smokeless tobacco: Never Used  Substance and Sexual Activity  . Alcohol use: Yes    Alcohol/week: 1.2 oz    Types: 2 Glasses of wine per week    Comment: wine daily  . Drug use: No  . Sexual activity: Not on file  Lifestyle  . Physical activity:    Days per week: Not on file    Minutes per session: Not on file  . Stress: Not on file  Relationships  . Social connections:    Talks on phone: Not on file    Gets together: Not on file    Attends religious service: Not on file    Active member of club or  organization: Not on file    Attends meetings of clubs or organizations: Not on file    Relationship status: Not on file  . Intimate partner violence:    Fear of current or ex partner: Not on file    Emotionally abused: Not on file    Physically abused: Not on file    Forced sexual activity: Not on file  Other Topics Concern  . Not on file  Social History Narrative   2 cups of coffee in the morning and tea throughout the day.    FAMILY HISTORY: Family History  Problem Relation Age of Onset  . Aortic aneurysm Mother        desceding aoritic aneurysm  . Cancer Brother  Lung cancer  . Colon cancer Neg Hx   . Esophageal cancer Neg Hx   . Stomach cancer Neg Hx   . Rectal cancer Neg Hx     ALLERGIES:  is allergic to latex.  MEDICATIONS:  Current Outpatient Medications  Medication Sig Dispense Refill  . amLODipine (NORVASC) 5 MG tablet Take 1 tablet (5 mg total) by mouth daily. 90 tablet 3  . apixaban (ELIQUIS) 5 MG TABS tablet Take 1 tablet (5 mg total) by mouth 2 (two) times daily. 180 tablet 3  . ascorbic Acid (VITAMIN C CR) 500 MG CPCR Take 500 mg by mouth daily.     Marland Kitchen atorvastatin (LIPITOR) 40 MG tablet Take 1 tablet (40 mg total) by mouth daily. 90 tablet 3  . cetirizine (ZYRTEC) 10 MG tablet Take 1 tablet (10 mg total) by mouth daily. 90 tablet 3  . clopidogrel (PLAVIX) 75 MG tablet Take 1 tablet (75 mg total) by mouth daily. 90 tablet 3  . docusate sodium (COLACE) 100 MG capsule Take 100 mg by mouth 2 (two) times daily.     . Fiber CAPS Take 5.7 mg by mouth See admin instructions. Take (10) 0.57 capsules by mouth daily for regularity.    . folic acid (FOLVITE) 1 MG tablet Take 1 tablet (1 mg total) by mouth daily. 90 tablet 3  . gemfibrozil (LOPID) 600 MG tablet Take 2 tablets (1,200 mg total) by mouth daily. 180 tablet 3  . hydrochlorothiazide (HYDRODIURIL) 25 MG tablet Take 1 tablet (25 mg total) by mouth daily. 90 tablet 2  . metFORMIN (GLUCOPHAGE-XR) 500 MG 24 hr  tablet Take 4 tablets (2,000 mg total) by mouth daily. 4 tabs by mouth in the AM 360 tablet 3  . metroNIDAZOLE (METROGEL) 0.75 % gel Apply 1 application topically 2 (two) times daily.     . Multiple Vitamin (MULTIVITAMIN) capsule Take 1 capsule by mouth daily.     Marland Kitchen olmesartan (BENICAR) 40 MG tablet Take 1 tablet (40 mg total) by mouth daily. 90 tablet 2  . Omega-3 Fatty Acids (FISH OIL PO) Take 2 capsules by mouth daily.     . pioglitazone (ACTOS) 30 MG tablet Take 1 tablet (30 mg total) by mouth daily. 90 tablet 3  . ranitidine (ZANTAC) 150 MG tablet Take 1 tablet (150 mg total) by mouth 2 (two) times daily. 180 tablet 3  . triamcinolone (NASACORT) 55 MCG/ACT AERO nasal inhaler Place 2 sprays into the nose daily. 1 Inhaler 12   No current facility-administered medications for this visit.     REVIEW OF SYSTEMS:    10 Point review of Systems was done is negative except as noted above.  PHYSICAL EXAMINATION: ECOG PERFORMANCE STATUS: 2 - Symptomatic, <50% confined to bed  . Vitals:   09/09/17 1005  BP: (!) 156/68  Pulse: 64  Resp: 17  Temp: 98.3 F (36.8 C)  SpO2: 100%   Filed Weights   09/09/17 1005  Weight: 268 lb 8 oz (121.8 kg)   .Body mass index is 38.53 kg/m.  GENERAL:alert, in no acute distress and comfortable SKIN: no acute rashes, no significant lesions EYES: conjunctiva are pink and non-injected, sclera anicteric OROPHARYNX: MMM, no exudates, no oropharyngeal erythema or ulceration NECK: supple, no JVD LYMPH:  no palpable lymphadenopathy in the cervical, axillary or inguinal regions LUNGS: clear to auscultation b/l with normal respiratory effort HEART: regular rate & rhythm ABDOMEN:  normoactive bowel sounds , non tender, not distended. Extremity: (+) 2+ Pitting edema in  LE PSYCH: alert & oriented x 3 with fluent speech NEURO: no focal motor/sensory deficits   LABORATORY DATA:  I have reviewed the data as listed  . CBC Latest Ref Rng & Units 06/26/2017  03/15/2016 01/19/2015  WBC 4.0 - 10.5 K/uL 9.4 4.1 4.5  Hemoglobin 13.0 - 17.0 g/dL 13.3 13.4 13.8  Hematocrit 39.0 - 52.0 % 38.9(L) 39.1 40.0  Platelets 150.0 - 400.0 K/uL 263.0 205.0 210.0    . CMP Latest Ref Rng & Units 07/17/2017 06/26/2017 11/12/2016  Glucose 70 - 99 mg/dL - 145(H) 143(H)  BUN 6 - 23 mg/dL - 19 22  Creatinine 0.76 - 1.27 mg/dL 1.08 0.97 1.01  Sodium 135 - 145 mEq/L - 136 135  Potassium 3.5 - 5.1 mEq/L - 4.0 4.7  Chloride 96 - 112 mEq/L - 99 99  CO2 19 - 32 mEq/L - 28 27  Calcium 8.4 - 10.5 mg/dL - 10.0 10.0  Total Protein 6.0 - 8.5 g/dL 7.1 7.9 7.6  Total Bilirubin 0.2 - 1.2 mg/dL - 0.6 0.5  Alkaline Phos 39 - 117 U/L - 37(L) 32(L)  AST 0 - 37 U/L - 20 21  ALT 0 - 53 U/L - 18 17    OUTSIDE LABS from 08/06/17         RADIOGRAPHIC STUDIES: I have personally reviewed the radiological images as listed and agreed with the findings in the report. US Renal  Result Date: 08/25/2017 CLINICAL DATA:  Hypertension, microscopic hematuria EXAM: RENAL / URINARY TRACT ULTRASOUND COMPLETE COMPARISON:  CT scan of the abdomen and pelvis of October 13, 2008 FINDINGS: Right Kidney: Length: 14.4 cm. The renal cortical echotexture remains lower than that of the adjacent liver. There are punctate hyperechoic foci which may reflect parenchymal calcifications or small nonobstructing stones. No hydronephrosis. Left Kidney: Length: 11.3 cm. The cortical echotexture is similar to that on the right. There is a lateral cortical cyst measuring 1.7 cm in greatest dimension. There is mild dilation of a lower pole calyx which persists on postvoid images. Bladder: The urinary bladder is moderately distended. A partial septation versus diverticulum superiorly is suspected. There is a small postvoid residual urinary bladder volume. IMPRESSION: Minimal dilation of a lower pole calyx on the left that is of uncertain etiology. There is a simple appearing cortical cyst laterally in the midpole of the left  kidney measuring 1.7 cm in diameter. Possible punctate stones versus parenchymal calcification in the right kidney. Partially septated bladder versus bladder diverticulum. Electronically Signed   By: David  Martinique M.D.   On: 08/25/2017 14:23    ASSESSMENT & PLAN:  David [name] is a 67 y.o. caucasian male with    1. Elevated Kappa Light Chain with slight increase in serum kappa lambda light chain ratio of 2.2. SPEP did not show any overt monoclonal protein spikes. Evaluate for plasma cell dyscrasia in the setting of nephrotic syndrome.  US Renal 08/25/17 shows enlargement of kidney's R>L, with moderate distention of bladder  PLAN:  -I reviewed his Labs and kidney US with him in details . -He appears to have nephrotic range proteinuria on spot urine testing.  This could certainly be from his long-standing diabetes. -He is being evaluated for other evidence of glomerular disease by his nephrologist which is quite appropriate. -He is scheduled for a kidney biopsy in 10/16/2017.  This should give [place] more education regarding the possibility of any plasma cell dyscrasia such as AL amyloidosis. -He does not have any overt clinical or lab  criteria to suggest multiple myeloma at this time. -If AL amyloidosis is noted on biopsy results then he would need additional work-up with [place] including bone marrow biopsy bone survey and cardiac evaluation for amyloidosis as well. -If an alternative glomerular disease is found as an explanation for his proteinuria then he would only need monitoring from a perspective of elevated serum light chains with repeat levels in 6 months with SPEP/IFE and serum free light chains. -We shall hold off on additional serological testing 24-hour urine bone surveys and bone marrow biopsy pending kidney biopsy results. -He will continue to follow with his primary care physician for management of-his vascular risk factors and diabetes. -He is set to have kidney biopsy on  10/16/17. Would recommend getting-prothrombin time, APTT and thrombin time prior to renal biopsy to rule out any coagulopathies related to paraproteinemia.   RTC with Dr Irene Limbo in 6 weeks with labs    All of the patients questions were answered with apparent satisfaction. The patient knows to call the clinic with any problems, questions or concerns.  I spent 40 minutes counseling the patient face to face. The total time spent in the appointment was 50 minutes and more than 50% was on counseling and direct patient cares.    Sullivan Lone MD [place] AAHIVMS [facility] Hematology/Oncology Physician [facility]  (Office):       [phone] (Work cell):  [phone] (Fax):           [phone]  09/09/2017 10:08 AM  This document serves as a record of services personally performed by Sullivan Lone, MD. It was created on his behalf by Joslyn Devon, a trained medical scribe. The creation of this record is based on the scribe's personal observations and the provider's statements to them.    .I have reviewed the above documentation for accuracy and completeness, and I agree with the above.  Brunetta Genera MD MS

## 2017-09-09 NOTE — Patient Instructions (Signed)
Thank you for choosing [facility] to provide your oncology and hematology care.  To afford each patient quality time with our providers, please arrive 30 minutes before your scheduled appointment time.  If you arrive late for your appointment, you may be asked to reschedule.  We strive to give you quality time with our providers, and arriving late affects you and other patients whose appointments are after yours.   If you are a no show for multiple scheduled visits, you may be dismissed from the clinic at the providers discretion.    Again, thank you for choosing [facility], our hope is that these requests will decrease the amount of time that you wait before being seen by our physicians.  ______________________________________________________________________  Should you have questions after your visit to the [facility], please contact our office at [phone] between the hours of 8:30 and 4:30 p.m.    Voicemails left after 4:30p.m will not be returned until the following business day.    For prescription refill requests, please have your pharmacy contact us directly.  Please also try to allow 48 hours for prescription requests.    Please contact the scheduling department for questions regarding scheduling.  For scheduling of procedures such as PET scans, CT scans, MRI, Ultrasound, etc please contact central scheduling at [phone].    Resources For Cancer Patients and Caregivers:   Oncolink.org:  A wonderful resource for patients and healthcare providers for information regarding your disease, ways to tract your treatment, what to expect, etc.     American Cancer Society:  800-227-2345  Can help patients locate various types of support and financial assistance  Cancer Care: 1-800-813-HOPE (4673) Provides financial assistance, online support groups, medication/co-pay assistance.    [place] DSS:  336-641-3447 Where to apply for food  stamps, Medicaid, and utility assistance  Medicare Rights Center: 800-333-4114 Helps people with Medicare understand their rights and benefits, navigate the Medicare system, and secure the quality healthcare they deserve  SCAT: 336-333-6589 [place] [employer]'s shared-ride transportation service for eligible riders who have a disability that prevents them from riding the fixed route bus.    For additional information on assistance programs please contact our social worker:   Grier Hock/Abigail Elmore:  [phone]            

## 2017-09-09 NOTE — Telephone Encounter (Signed)
Printed avs and calender of upcoming appointment per orders 5/7 los

## 2017-10-08 ENCOUNTER — Ambulatory Visit: Payer: Medicare Other | Admitting: Cardiovascular Disease

## 2017-10-10 DIAGNOSIS — R809 Proteinuria, unspecified: Secondary | ICD-10-CM | POA: Diagnosis not present

## 2017-10-10 DIAGNOSIS — I10 Essential (primary) hypertension: Secondary | ICD-10-CM | POA: Diagnosis not present

## 2017-10-10 DIAGNOSIS — I714 Abdominal aortic aneurysm, without rupture: Secondary | ICD-10-CM | POA: Diagnosis not present

## 2017-10-13 ENCOUNTER — Ambulatory Visit (HOSPITAL_COMMUNITY): Payer: Medicare Other

## 2017-10-15 ENCOUNTER — Other Ambulatory Visit: Payer: Self-pay | Admitting: Radiology

## 2017-10-16 ENCOUNTER — Encounter (HOSPITAL_COMMUNITY): Payer: Self-pay

## 2017-10-16 ENCOUNTER — Ambulatory Visit (HOSPITAL_COMMUNITY)
Admission: RE | Admit: 2017-10-16 | Discharge: 2017-10-16 | Disposition: A | Discharge status: Home / Self Care | Payer: Medicare Other | Source: Ambulatory Visit | Attending: Nephrology | Admitting: Nephrology

## 2017-10-16 DIAGNOSIS — Z7902 Long term (current) use of antithrombotics/antiplatelets: Secondary | ICD-10-CM | POA: Insufficient documentation

## 2017-10-16 DIAGNOSIS — Z91040 Latex allergy status: Secondary | ICD-10-CM | POA: Insufficient documentation

## 2017-10-16 DIAGNOSIS — Z8679 Personal history of other diseases of the circulatory system: Secondary | ICD-10-CM | POA: Diagnosis not present

## 2017-10-16 DIAGNOSIS — E1151 Type 2 diabetes mellitus with diabetic peripheral angiopathy without gangrene: Secondary | ICD-10-CM | POA: Insufficient documentation

## 2017-10-16 DIAGNOSIS — N183 Chronic kidney disease, stage 3 (moderate): Secondary | ICD-10-CM | POA: Diagnosis not present

## 2017-10-16 DIAGNOSIS — Z9889 Other specified postprocedural states: Secondary | ICD-10-CM | POA: Diagnosis not present

## 2017-10-16 DIAGNOSIS — K219 Gastro-esophageal reflux disease without esophagitis: Secondary | ICD-10-CM | POA: Insufficient documentation

## 2017-10-16 DIAGNOSIS — Z79899 Other long term (current) drug therapy: Secondary | ICD-10-CM | POA: Insufficient documentation

## 2017-10-16 DIAGNOSIS — Z8249 Family history of ischemic heart disease and other diseases of the circulatory system: Secondary | ICD-10-CM | POA: Insufficient documentation

## 2017-10-16 DIAGNOSIS — F419 Anxiety disorder, unspecified: Secondary | ICD-10-CM | POA: Diagnosis not present

## 2017-10-16 DIAGNOSIS — F329 Major depressive disorder, single episode, unspecified: Secondary | ICD-10-CM | POA: Insufficient documentation

## 2017-10-16 DIAGNOSIS — Z7984 Long term (current) use of oral hypoglycemic drugs: Secondary | ICD-10-CM | POA: Insufficient documentation

## 2017-10-16 DIAGNOSIS — E785 Hyperlipidemia, unspecified: Secondary | ICD-10-CM | POA: Diagnosis not present

## 2017-10-16 DIAGNOSIS — E1122 Type 2 diabetes mellitus with diabetic chronic kidney disease: Secondary | ICD-10-CM | POA: Diagnosis not present

## 2017-10-16 DIAGNOSIS — R809 Proteinuria, unspecified: Secondary | ICD-10-CM | POA: Diagnosis not present

## 2017-10-16 DIAGNOSIS — Z7901 Long term (current) use of anticoagulants: Secondary | ICD-10-CM | POA: Insufficient documentation

## 2017-10-16 DIAGNOSIS — I69898 Other sequelae of other cerebrovascular disease: Secondary | ICD-10-CM | POA: Diagnosis not present

## 2017-10-16 DIAGNOSIS — I129 Hypertensive chronic kidney disease with stage 1 through stage 4 chronic kidney disease, or unspecified chronic kidney disease: Secondary | ICD-10-CM | POA: Insufficient documentation

## 2017-10-16 DIAGNOSIS — Z801 Family history of malignant neoplasm of trachea, bronchus and lung: Secondary | ICD-10-CM | POA: Insufficient documentation

## 2017-10-16 DIAGNOSIS — Z87891 Personal history of nicotine dependence: Secondary | ICD-10-CM | POA: Diagnosis not present

## 2017-10-16 LAB — CBC
HEMATOCRIT: 37.2 % — AB (ref 39.0–52.0)
Hemoglobin: 12.6 g/dL — ABNORMAL LOW (ref 13.0–17.0)
MCH: 32.6 pg (ref 26.0–34.0)
MCHC: 33.9 g/dL (ref 30.0–36.0)
MCV: 96.1 fL (ref 78.0–100.0)
Platelets: 203 10*3/uL (ref 150–400)
RBC: 3.87 MIL/uL — ABNORMAL LOW (ref 4.22–5.81)
RDW: 12.4 % (ref 11.5–15.5)
WBC: 4.1 10*3/uL (ref 4.0–10.5)

## 2017-10-16 LAB — PROTIME-INR
INR: 1.17
Prothrombin Time: 14.8 seconds (ref 11.4–15.2)

## 2017-10-16 LAB — GLUCOSE, CAPILLARY
GLUCOSE-CAPILLARY: 124 mg/dL — AB (ref 65–99)
Glucose-Capillary: 125 mg/dL — ABNORMAL HIGH (ref 65–99)

## 2017-10-16 MED ORDER — SODIUM CHLORIDE 0.9 % IV SOLN: INTRAVENOUS | Status: DC

## 2017-10-16 MED ORDER — FENTANYL CITRATE (PF) 100 MCG/2ML IJ SOLN
INTRAMUSCULAR | Status: AC
  Filled 2017-10-16: qty 4

## 2017-10-16 MED ORDER — FENTANYL CITRATE (PF) 100 MCG/2ML IJ SOLN
INTRAMUSCULAR | Status: AC | PRN
  Administered 2017-10-16 (×2): 25 ug via INTRAVENOUS
  Administered 2017-10-16: 50 ug via INTRAVENOUS

## 2017-10-16 MED ORDER — HYDROCODONE-ACETAMINOPHEN 5-325 MG PO TABS: 1.0000 | ORAL_TABLET | ORAL | Status: DC | PRN

## 2017-10-16 MED ORDER — LIDOCAINE HCL (PF) 1 % IJ SOLN
INTRAMUSCULAR | Status: AC
  Filled 2017-10-16: qty 30

## 2017-10-16 MED ORDER — GELATIN ABSORBABLE 12-7 MM EX MISC
CUTANEOUS | Status: AC
  Filled 2017-10-16: qty 1

## 2017-10-16 MED ORDER — MIDAZOLAM HCL 2 MG/2ML IJ SOLN
INTRAMUSCULAR | Status: AC | PRN
  Administered 2017-10-16: 1 mg via INTRAVENOUS

## 2017-10-16 MED ORDER — MIDAZOLAM HCL 5 MG/5ML IJ SOLN
INTRAMUSCULAR | Status: AC | PRN
  Administered 2017-10-16: 0.5 mg via INTRAVENOUS

## 2017-10-16 MED ORDER — MIDAZOLAM HCL 2 MG/2ML IJ SOLN
INTRAMUSCULAR | Status: AC
  Filled 2017-10-16: qty 4

## 2017-10-16 NOTE — Progress Notes (Signed)
Referring Physician(s): Rosita Fire  Supervising Physician: Markus Daft  Patient Status:  [facility] OP  Chief Complaint:  Rt renal biopsy in IR today  Subjective:  Doing well No complaints Has urinated on own-- clear yellow Denies N/V Denies pain  Allergies: Latex  Medications: Prior to Admission medications   Medication Sig Start Date End Date Taking? Authorizing Provider  amLODipine (NORVASC) 5 MG tablet Take 1 tablet (5 mg total) by mouth daily. Patient taking differently: Take 10 mg by mouth daily.  06/18/17  Yes Biagio Borg, MD  apixaban (ELIQUIS) 5 MG TABS tablet Take 1 tablet (5 mg total) by mouth 2 (two) times daily. 06/18/17  Yes Biagio Borg, MD  ascorbic Acid (VITAMIN C CR) 500 MG CPCR Take 500 mg by mouth daily.    Yes [provider]  atorvastatin (LIPITOR) 40 MG tablet Take 1 tablet (40 mg total) by mouth daily. 06/18/17  Yes Biagio Borg, MD  cetirizine (ZYRTEC) 10 MG tablet Take 1 tablet (10 mg total) by mouth daily. 08/18/14  Yes Biagio Borg, MD  clopidogrel (PLAVIX) 75 MG tablet Take 1 tablet (75 mg total) by mouth daily. 06/18/17  Yes Biagio Borg, MD  docusate sodium (COLACE) 100 MG capsule Take 100 mg by mouth 2 (two) times daily.    Yes [provider]  Fiber CAPS Take 5.7 mg by mouth See admin instructions. Take (10) 0.57 capsules by mouth daily for regularity.   Yes [provider]  folic acid (FOLVITE) 1 MG tablet Take 1 tablet (1 mg total) by mouth daily. 03/19/16  Yes Biagio Borg, MD  gemfibrozil (LOPID) 600 MG tablet Take 2 tablets (1,200 mg total) by mouth daily. 06/18/17  Yes Biagio Borg, MD  metFORMIN (GLUCOPHAGE-XR) 500 MG 24 hr tablet Take 4 tablets (2,000 mg total) by mouth daily. 4 tabs by mouth in the AM 06/18/17  Yes Biagio Borg, MD  metroNIDAZOLE (METROGEL) 0.75 % gel Apply 1 application topically daily.  10/09/14  Yes [provider]  Multiple Vitamin (MULTIVITAMIN) capsule Take 1 capsule by  mouth daily.    Yes [provider]  olmesartan-hydrochlorothiazide (BENICAR HCT) 40-25 MG tablet Take 1 tablet by mouth daily.   Yes [provider]  Omega-3 Fatty Acids (FISH OIL PO) Take 1 capsule by mouth daily.    Yes [provider]  pioglitazone (ACTOS) 30 MG tablet Take 1 tablet (30 mg total) by mouth daily. 06/18/17  Yes Biagio Borg, MD  ranitidine (ZANTAC) 150 MG tablet Take 1 tablet (150 mg total) by mouth 2 (two) times daily. 08/18/14 10/06/17 Yes Biagio Borg, MD  triamcinolone (NASACORT) 55 MCG/ACT AERO nasal inhaler Place 2 sprays into the nose daily. Patient taking differently: Place 1 spray into the nose at bedtime.  06/18/17  Yes Biagio Borg, MD     Vital Signs: BP 124/70   Pulse (!) 57   Temp 98.2 F (36.8 C) (Oral)   Resp 14   Ht 5\' 10"  (1.778 m)   Wt 262 lb (118.8 kg)   SpO2 99%   BMI 37.59 kg/m   Physical Exam  Musculoskeletal: Normal range of motion.  Neurological: He is alert.  Skin: Skin is warm and dry.  Site is clean and dry NT no bleeding No hematoma   Vitals reviewed.   Imaging: US Biopsy (kidney)  Result Date: 10/16/2017 INDICATION: 67 year old with proteinuria.  Request for renal biopsy. EXAM: ULTRASOUND-GUIDED RIGHT RENAL  BIOPSY MEDICATIONS: None. ANESTHESIA/SEDATION: Moderate (conscious) sedation was employed during this procedure. A total of Versed 1.5 mg and Fentanyl 100 mcg was administered intravenously. Moderate Sedation Time: 22 minutes. The patient's level of consciousness and vital signs were monitored continuously by radiology nursing throughout the procedure under my direct supervision. FLUOROSCOPY TIME:  None COMPLICATIONS: None immediate. PROCEDURE: Informed written consent was obtained from the patient after a thorough discussion of the procedural risks, benefits and alternatives. All questions were addressed. A timeout was performed prior to the initiation of the procedure. Both kidneys were evaluated with  ultrasound. The right kidney was selected for biopsy. The right flank was prepped with chlorhexidine and sterile field was created. Skin and soft tissues were anesthetized with 1% lidocaine. Using real-time ultrasound guidance, 16 gauge core needle was directed into the right kidney lower pole and core biopsy was obtained. Specimen placed in saline. A second core biopsy was obtained with ultrasound guidance with the 16 gauge needle. Second core biopsy was adequate and placed in saline. Post biopsy ultrasound images were obtained. Bandage placed over the puncture site. Patient was hemodynamically stable throughout the procedure. FINDINGS: Core biopsies obtained from the right kidney lower pole. Question a small hematoma in the perinephric space identified immediately following the core biopsies. This area was watched for a few minutes and did not appear to be enlarging. No evidence for active bleeding. IMPRESSION: Successful ultrasound-guided core biopsies of the right kidney lower pole. Electronically Signed   By: Markus Daft M.D.   On: 10/16/2017 09:25    Labs:  CBC: Recent Labs    06/26/17 0902 10/16/17 0631  WBC 9.4 4.1  HGB 13.3 12.6*  HCT 38.9* 37.2*  PLT 263.0 203    COAGS: Recent Labs    10/16/17 0631  INR 1.17    BMP: Recent Labs    11/12/16 0822 06/26/17 0902 07/17/17 1154  NA 135 136  --   K 4.7 4.0  --   CL 99 99  --   CO2 27 28  --   GLUCOSE 143* 145*  --   BUN 22 19  --   CALCIUM 10.0 10.0  --   CREATININE 1.01 0.97 1.08  GFRNONAA  --   --  70  GFRAA  --   --  81    LIVER FUNCTION TESTS: Recent Labs    11/12/16 0822 06/26/17 0902 07/17/17 1154  BILITOT 0.5 0.6  --   AST 21 20  --   ALT 17 18  --   ALKPHOS 32* 37*  --   PROT 7.6 7.9 7.1  ALBUMIN 4.1 4.0  --     Assessment and Plan:  Rt renal biopsy for proteinuria Doing well post procedure No complications May DC home   Electronically Signed: Syriah Delisi A, PA-C 10/16/2017, 12:00 PM   I  spent a total of 15 Minutes at the the patient's bedside AND on the patient's hospital floor or unit, greater than 50% of which was counseling/coordinating care for rt renal bx

## 2017-10-16 NOTE — H&P (Signed)
Chief Complaint: Patient was seen in consultation today for random renal biopsy at the request of Rosita Fire  Referring Physician(s): Rosita Fire  Supervising Physician: Markus Daft  Patient Status: [facility] - Out-pt  History of Present Illness: David [name] is a 67 y.o. male   Nephrotic range proteinuria Pt followed by Dr Marshall Cork and noted proteinuria several months ago Felt to be low grade and followed Has increased and referred to Dr Carolin Sicks  Proteinuria is nephrotic range and now scheduled for random renal biopsy  LD Plavix 1 week LD Eliquis Sunday  Past Medical History:  Diagnosis Date  . AAA (abdominal aortic aneurysm) ([place])   . 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 Anticardiolipin antibody positive   . Anxiety   . ANXIETY 07/19/2008  . CEREBROVASCULAR ACCIDENT, HX OF 07/19/2008  . CVA (cerebral vascular accident) ([place]) 2003  . Depression   . DEPRESSION 07/19/2008  . Diabetes mellitus    type II  . DIABETES MELLITUS, TYPE II 01/18/2008  . Diverticulosis   . GERD 07/19/2008  . GERD (gastroesophageal reflux disease)   . Hemorrhoids   . Hx of adenomatous colonic polyps   . Hyperlipidemia   . HYPERLIPIDEMIA 07/19/2008  . Hypertension   . HYPERTENSION 07/31/2009  . Increased prostate specific antigen (PSA) velocity 09/13/2010  . Internal hemorrhoids   . Perineal abscess   . Peripheral vascular disease ([place])    peripheral vascular disease/carotid artery <100%  . PERSONAL HX COLONIC POLYPS 12/10/2007  . Unspecified Peripheral Vascular Disease 01/18/2008  . Varicose veins     Past Surgical History:  Procedure Laterality Date  . 7 abdominal hernia repairs  05/31/10  . ABDOMINAL AORTAGRAM N/A 01/12/2013   Procedure: ABDOMINAL Maxcine Ham;  Surgeon: Serafina Mitchell, MD;  Location: [facility] CATH LAB;  Service: Cardiovascular;  Laterality: N/A;  . ABDOMINAL AORTIC ANEURYSM REPAIR    . COLONOSCOPY    . hernia surgury     67 years old  .  perineal abscess    . PERIPHERAL VASCULAR CATHETERIZATION N/A 11/23/2014   Procedure: Abdominal Aortogram;  Surgeon: Serafina Mitchell, MD;  Location: [place] CV LAB;  Service: Cardiovascular;  Laterality: N/A;  . PERIPHERAL VASCULAR CATHETERIZATION  11/23/2014   Procedure: Lower Extremity Angiography;  Surgeon: Serafina Mitchell, MD;  Location: [place] CV LAB;  Service: Cardiovascular;;  . PERIPHERAL VASCULAR CATHETERIZATION  11/23/2014   Procedure: Peripheral Vascular Intervention;  Surgeon: Serafina Mitchell, MD;  Location: [place] CV LAB;  Service: Cardiovascular;;  PTA bilateral common iliac PTA/DCB left fem-pop  . POLYPECTOMY    . 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 SP ENDO REPAIR INFRAREN AAA      Allergies: Latex  Medications: Prior to Admission medications   Medication Sig Start Date End Date Taking? Authorizing Provider  amLODipine (NORVASC) 5 MG tablet Take 1 tablet (5 mg total) by mouth daily. Patient taking differently: Take 10 mg by mouth daily.  06/18/17  Yes Biagio Borg, MD  apixaban (ELIQUIS) 5 MG TABS tablet Take 1 tablet (5 mg total) by mouth 2 (two) times daily. 06/18/17  Yes Biagio Borg, MD  ascorbic Acid (VITAMIN C CR) 500 MG CPCR Take 500 mg by mouth daily.    Yes [provider]  atorvastatin (LIPITOR) 40 MG tablet Take 1 tablet (40 mg total) by mouth daily. 06/18/17  Yes Biagio Borg, MD  cetirizine (ZYRTEC) 10 MG tablet Take 1  tablet (10 mg total) by mouth daily. 08/18/14  Yes Biagio Borg, MD  clopidogrel (PLAVIX) 75 MG tablet Take 1 tablet (75 mg total) by mouth daily. 06/18/17  Yes Biagio Borg, MD  docusate sodium (COLACE) 100 MG capsule Take 100 mg by mouth 2 (two) times daily.    Yes [provider]  Fiber CAPS Take 5.7 mg by mouth See admin instructions. Take (10) 0.57 capsules by mouth daily for regularity.   Yes [provider]  folic acid (FOLVITE) 1 MG tablet Take 1 tablet (1 mg total) by mouth daily.  03/19/16  Yes Biagio Borg, MD  gemfibrozil (LOPID) 600 MG tablet Take 2 tablets (1,200 mg total) by mouth daily. 06/18/17  Yes Biagio Borg, MD  metFORMIN (GLUCOPHAGE-XR) 500 MG 24 hr tablet Take 4 tablets (2,000 mg total) by mouth daily. 4 tabs by mouth in the AM 06/18/17  Yes Biagio Borg, MD  metroNIDAZOLE (METROGEL) 0.75 % gel Apply 1 application topically daily.  10/09/14  Yes [provider]  Multiple Vitamin (MULTIVITAMIN) capsule Take 1 capsule by mouth daily.    Yes [provider]  olmesartan-hydrochlorothiazide (BENICAR HCT) 40-25 MG tablet Take 1 tablet by mouth daily.   Yes [provider]  Omega-3 Fatty Acids (FISH OIL PO) Take 1 capsule by mouth daily.    Yes [provider]  pioglitazone (ACTOS) 30 MG tablet Take 1 tablet (30 mg total) by mouth daily. 06/18/17  Yes Biagio Borg, MD  ranitidine (ZANTAC) 150 MG tablet Take 1 tablet (150 mg total) by mouth 2 (two) times daily. 08/18/14 10/06/17 Yes Biagio Borg, MD  triamcinolone (NASACORT) 55 MCG/ACT AERO nasal inhaler Place 2 sprays into the nose daily. Patient taking differently: Place 1 spray into the nose at bedtime.  06/18/17  Yes Biagio Borg, MD     Family History  Problem Relation Age of Onset  . Aortic aneurysm Mother        desceding aoritic aneurysm  . Cancer Brother        Lung cancer  . Colon cancer Neg Hx   . Esophageal cancer Neg Hx   . Stomach cancer Neg Hx   . Rectal cancer Neg Hx     Social History   Socioeconomic History  . Marital status: Married    Spouse name: Not on file  . Number of children: 1  . Years of education: Not on file  . Highest education level: Not on file  Occupational History  . Occupation: dsiabled due to stroke    Employer: RETIRED  Social Needs  . Financial resource strain: Not on file  . Food insecurity:    Worry: Not on file    Inability: Not on file  . Transportation needs:    Medical: Not on file    Non-medical: Not on file    Tobacco Use  . Smoking status: Former Smoker    Types: Cigarettes    Last attempt to quit: 01/14/1992    Years since quitting: 25.7  . Smokeless tobacco: Never Used  Substance and Sexual Activity  . Alcohol use: Yes    Alcohol/week: 1.2 oz    Types: 2 Glasses of wine per week    Comment: wine daily  . Drug use: No  . Sexual activity: Not on file  Lifestyle  . Physical activity:    Days per week: Not on file    Minutes per session: Not on file  . Stress:  Not on file  Relationships  . Social connections:    Talks on phone: Not on file    Gets together: Not on file    Attends religious service: Not on file    Active member of club or organization: Not on file    Attends meetings of clubs or organizations: Not on file    Relationship status: Not on file  Other Topics Concern  . Not on file  Social History Narrative   2 cups of coffee in the morning and tea throughout the day.    Review of Systems: A 12 point ROS discussed and pertinent positives are indicated in the HPI above.  All other systems are negative.  Review of Systems  Constitutional: Negative for activity change, fatigue and fever.  Respiratory: Negative for cough and shortness of breath.   Gastrointestinal: Negative for abdominal pain.  Neurological: Negative for weakness.  Psychiatric/Behavioral: Negative for behavioral problems and confusion.    Vital Signs: BP (!) 158/77   Pulse (!) 58   Temp 97.9 F (36.6 C) (Oral)   Resp 16   Ht 5\' 10"  (1.778 m)   Wt 262 lb (118.8 kg)   SpO2 99%   BMI 37.59 kg/m   Physical Exam  Constitutional: He is oriented to person, place, and time.  Cardiovascular: Normal rate, regular rhythm and normal heart sounds.  Pulmonary/Chest: Effort normal and breath sounds normal.  Abdominal: Soft. Bowel sounds are normal.  Musculoskeletal: Normal range of motion.  Neurological: He is alert and oriented to person, place, and time.  Skin: Skin is warm and dry.  Psychiatric: He  has a normal mood and affect. His behavior is normal. Judgment and thought content normal.  Vitals reviewed.   Imaging: No results found.  Labs:  CBC: Recent Labs    06/26/17 0902  WBC 9.4  HGB 13.3  HCT 38.9*  PLT 263.0    COAGS: No results for input(s): INR, APTT in the last 8760 hours.  BMP: Recent Labs    11/12/16 0822 06/26/17 0902 07/17/17 1154  NA 135 136  --   K 4.7 4.0  --   CL 99 99  --   CO2 27 28  --   GLUCOSE 143* 145*  --   BUN 22 19  --   CALCIUM 10.0 10.0  --   CREATININE 1.01 0.97 1.08  GFRNONAA  --   --  70  GFRAA  --   --  81    LIVER FUNCTION TESTS: Recent Labs    11/12/16 0822 06/26/17 0902 07/17/17 1154  BILITOT 0.5 0.6  --   AST 21 20  --   ALT 17 18  --   ALKPHOS 32* 37*  --   PROT 7.6 7.9 7.1  ALBUMIN 4.1 4.0  --     TUMOR MARKERS: No results for input(s): AFPTM, CEA, CA199, CHROMGRNA in the last 8760 hours.  Assessment and Plan:  Proteinuria - increasing levels Now scheduled for random renal biopsy Risks and benefits discussed with the patient including, but not limited to bleeding, infection, damage to adjacent structures or low yield requiring additional tests.  All of the patient's questions were answered, patient is agreeable to proceed. Consent signed and in chart.   Thank you for this interesting consult.  I greatly enjoyed meeting BREANNA MCDANIEL and look forward to participating in their care.  A copy of this report was sent to the requesting provider on this date.  Electronically Signed: Monia Sabal  A, PA-C 10/16/2017, 7:21 AM   I spent a total of  30 Minutes   in face to face in clinical consultation, greater than 50% of which was counseling/coordinating care for random renal bx

## 2017-10-16 NOTE — Discharge Instructions (Addendum)

## 2017-10-16 NOTE — Procedures (Signed)
US guided core biopsies of right kidney lower pole.  Minimal blood loss and no immediate complication.  Plan for 4 hour bedrest.

## 2017-10-20 NOTE — Progress Notes (Signed)
HEMATOLOGY/ONCOLOGY CONSULTATION NOTE  Date of Service: 10/21/2017  Patient Care Team: Biagio Borg, MD as PCP - General Angelena Form Annita Brod, MD as Consulting Physician (Cardiology) Ladene Artist, MD as Consulting Physician (Gastroenterology)  CHIEF COMPLAINTS/PURPOSE OF CONSULTATION:  Elevated Kappa Light Chain  HISTORY OF PRESENTING ILLNESS:   David [name] is a wonderful 67 y.o. male who has been referred to [place] by  Dr. Lawson Radar for evaluation and management of his elevated kappa light chains in the setting of nephrotic range proteinuria.   Patient has a history of diabetes since 2009 with his last hemoglobin A1c 6.4, uncontrolled hypertension in February 2019, atrial fibrillation on Eliquis, AAA, severe peripheral vascular disease status post bypass surgery to both legs, hypertension, CVA, anxiety, depression who is primarily followed by Dr. Cathlean Cower.  He apparently has had low-grade proteinuria since 2016 with a urine microalbumin/creatinine ratio of 214 in July 2018.  He has had chronic lower extremity edema from his peripheral vascular disease.  He was recently noted to have significant increase in proteinuria and was referred to nephrology for further evaluation.  He was being seen by his nephrologist Dr. Lawson Radar who wanted to do further workup to find why his urine had so much protein and what is making his kidney's leak these proteins. He had a [place] of kidney on 08/25/17 as well. He notes he has a kidney biopsy on 10/16/17.  He was noted to have nephrotic range proteinuria on a spot urine study and it was unclear if this was related to diabetic nephropathy versus other glomerular disease. Urine protein creatinine ratio was noted to be 7204. Creatinine was stable at 1.03.  SPEP done showed no M spike Serum free light chains showed free kappa light chains of 52.5 and free lambda light chains of 23.6 with a kappa lambda ratio slightly elevated at 2.22.  ANA  was negative, C3 and C4 complements not reduced.  Hepatitis B surface antigen negative. He has 3 aneurysms which results in sten placement in right leg and AAA in 2010 and a bypass graph of left leg a year or so later.   On review of symptoms, pt denies any new changes over the past 6-12 months. He notes no new leg swelling. The LE swelling he does have is chronic, due to his peripheral arterial disease status post surgery. He knows how to manage this with compression socks and leg elevation. He denies any prior heart conditions, SOB or trouble breathing. Pt denies back or flank pain.   Interval History:   [name] returns today regarding elevated kappa light chains in the setting of nephrotic range proteinuria. The patient's last visit with [place] was on 09/09/17. The pt reports that he is doing well overall.   The pt reports that he is taking Eliquis for his A-fib. He resumed his blood thinners again on 10/18/17, two days after his kidney biopsy. He had some pink urine on 10/18/17, stopped his blood thinners, spoke with his PCP, saw his urine normalize, and began his blood thinners again today without trouble.   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  Lab results today (10/21/17) of CBC, CMP, and Reticulocytes is as follows: all values are WNL except for RBC at 3.60, HGB at 11.8, HCT at 33.2, Sodium at 131, GFR at 59. Urine Protein  10/21/17 showed Protein/Creatinine ratio elevated at 2.44.  MMP 10/21/17 is no M spike, normal IFE LDH 10/21/17 is WNL at 165   On review of systems, pt reports resolved hematuria, and denies abdominal pain and any other symptoms.    MEDICAL HISTORY:  Past Medical History:  Diagnosis Date  . AAA (abdominal aortic aneurysm) ([place])   . 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 ([place]) 09/07/2008  .  Anticardiolipin antibody positive   . Anxiety   . ANXIETY 07/19/2008  . CEREBROVASCULAR ACCIDENT, HX OF 07/19/2008  . CVA (cerebral vascular accident) ([place]) 2003  . Depression   . DEPRESSION 07/19/2008  . Diabetes mellitus    type II  . DIABETES MELLITUS, TYPE II 01/18/2008  . Diverticulosis   . GERD 07/19/2008  . GERD (gastroesophageal reflux disease)   . Hemorrhoids   . Hx of adenomatous colonic polyps   . Hyperlipidemia   . HYPERLIPIDEMIA 07/19/2008  . Hypertension   . HYPERTENSION 07/31/2009  . Increased prostate specific antigen (PSA) velocity 09/13/2010  . Internal hemorrhoids   . Perineal abscess   . Peripheral vascular disease ([place])    peripheral vascular disease/carotid artery <100%  . PERSONAL HX COLONIC POLYPS 12/10/2007  . Unspecified Peripheral Vascular Disease 01/18/2008  . Varicose veins     SURGICAL HISTORY: Past Surgical History:  Procedure Laterality Date  . 7 abdominal hernia repairs  05/31/10  . ABDOMINAL AORTAGRAM N/A 01/12/2013   Procedure: ABDOMINAL Maxcine Ham;  Surgeon: Serafina Mitchell, MD;  Location: [facility] CATH LAB;  Service: Cardiovascular;  Laterality: N/A;  . ABDOMINAL AORTIC ANEURYSM REPAIR    . COLONOSCOPY    . hernia surgury     68 years old  . perineal abscess    . PERIPHERAL VASCULAR CATHETERIZATION N/A 11/23/2014   Procedure: Abdominal Aortogram;  Surgeon: Serafina Mitchell, MD;  Location: [place] CV LAB;  Service: Cardiovascular;  Laterality: N/A;  . PERIPHERAL VASCULAR CATHETERIZATION  11/23/2014   Procedure: Lower Extremity Angiography;  Surgeon: Serafina Mitchell, MD;  Location: [place] CV LAB;  Service: Cardiovascular;;  . PERIPHERAL VASCULAR CATHETERIZATION  11/23/2014   Procedure: Peripheral Vascular Intervention;  Surgeon: Serafina Mitchell, MD;  Location: [place] [facility] LAB;  Service: Cardiovascular;;  PTA bilateral common iliac PTA/DCB left fem-pop  . POLYPECTOMY    . 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 SP ENDO REPAIR INFRAREN AAA       SOCIAL HISTORY: Social History   Socioeconomic History  . Marital status: Married    Spouse name: Not on file  . Number of children: 1  . Years of education: Not on file  . Highest education level: Not on file  Occupational History  . Occupation: dsiabled due to stroke    Employer: RETIRED  Social Needs  . Financial resource strain: Not on file  . Food insecurity:    Worry: Not on file    Inability: Not on file  . Transportation needs:    Medical: Not on file    Non-medical: Not on file  Tobacco Use  . Smoking status: Former Smoker    Types: Cigarettes    Last attempt to quit: 01/14/1992    Years since quitting: 25.7  . Smokeless tobacco: Never Used  Substance and Sexual Activity  . Alcohol use: Yes    Alcohol/week: 1.2 oz    Types: 2 Glasses of wine per week    Comment: wine daily  . Drug use: No  . Sexual  activity: Not on file  Lifestyle  . Physical activity:    Days per week: Not on file    Minutes per session: Not on file  . Stress: Not on file  Relationships  . Social connections:    Talks on phone: Not on file    Gets together: Not on file    Attends religious service: Not on file    Active member of club or organization: Not on file    Attends meetings of clubs or organizations: Not on file    Relationship status: Not on file  . Intimate partner violence:    Fear of current or ex partner: Not on file    Emotionally abused: Not on file    Physically abused: Not on file    Forced sexual activity: Not on file  Other Topics Concern  . Not on file  Social History Narrative   2 cups of coffee in the morning and tea throughout the day.    FAMILY HISTORY: Family History  Problem Relation Age of Onset  . Aortic aneurysm Mother        desceding aoritic aneurysm  . Cancer Brother        Lung cancer  . Colon cancer Neg Hx   . Esophageal cancer Neg Hx   . Stomach cancer Neg Hx   . Rectal cancer Neg Hx     ALLERGIES:  is allergic to  latex.  MEDICATIONS:  Current Outpatient Medications  Medication Sig Dispense Refill  . amLODipine (NORVASC) 5 MG tablet Take 1 tablet (5 mg total) by mouth daily. (Patient taking differently: Take 10 mg by mouth daily. ) 90 tablet 3  . apixaban (ELIQUIS) 5 MG TABS tablet Take 1 tablet (5 mg total) by mouth 2 (two) times daily. 180 tablet 3  . ascorbic Acid (VITAMIN C CR) 500 MG CPCR Take 500 mg by mouth daily.     Marland Kitchen atorvastatin (LIPITOR) 40 MG tablet Take 1 tablet (40 mg total) by mouth daily. 90 tablet 3  . cetirizine (ZYRTEC) 10 MG tablet Take 1 tablet (10 mg total) by mouth daily. 90 tablet 3  . clopidogrel (PLAVIX) 75 MG tablet Take 1 tablet (75 mg total) by mouth daily. 90 tablet 3  . docusate sodium (COLACE) 100 MG capsule Take 100 mg by mouth 2 (two) times daily.     . Fiber CAPS Take 5.7 mg by mouth See admin instructions. Take (10) 0.57 capsules by mouth daily for regularity.    . folic acid (FOLVITE) 1 MG tablet Take 1 tablet (1 mg total) by mouth daily. 90 tablet 3  . gemfibrozil (LOPID) 600 MG tablet Take 2 tablets (1,200 mg total) by mouth daily. 180 tablet 3  . metFORMIN (GLUCOPHAGE-XR) 500 MG 24 hr tablet Take 4 tablets (2,000 mg total) by mouth daily. 4 tabs by mouth in the AM 360 tablet 3  . metroNIDAZOLE (METROGEL) 0.75 % gel Apply 1 application topically daily.     . Multiple Vitamin (MULTIVITAMIN) capsule Take 1 capsule by mouth daily.     Marland Kitchen olmesartan-hydrochlorothiazide (BENICAR HCT) 40-25 MG tablet Take 1 tablet by mouth daily.    . Omega-3 Fatty Acids (FISH OIL PO) Take 1 capsule by mouth daily.     . pioglitazone (ACTOS) 30 MG tablet Take 1 tablet (30 mg total) by mouth daily. 90 tablet 3  . ranitidine (ZANTAC) 150 MG tablet Take 1 tablet (150 mg total) by mouth 2 (two) times daily. 180 tablet 3  .  triamcinolone (NASACORT) 55 MCG/ACT AERO nasal inhaler Place 2 sprays into the nose daily. (Patient taking differently: Place 1 spray into the nose at bedtime. ) 1 Inhaler  12   No current facility-administered medications for this visit.     REVIEW OF SYSTEMS:    A 10+ POINT REVIEW OF SYSTEMS WAS OBTAINED including neurology, dermatology, psychiatry, cardiac, respiratory, lymph, extremities, GI, GU, Musculoskeletal, constitutional, breasts, reproductive, HEENT.  All pertinent positives are noted in the HPI.  All others are negative.   PHYSICAL EXAMINATION: ECOG PERFORMANCE STATUS: 2 - Symptomatic, <50% confined to bed  Vitals:   10/21/17 1332  BP: (!) 148/62  Pulse: (!) 58  Resp: 18  Temp: 97.8 F (36.6 C)  SpO2: 100%   Filed Weights   10/21/17 1332  Weight: 267 lb 9.6 oz (121.4 kg)   .Body mass index is 38.4 kg/m.  GENERAL:alert, in no acute distress and comfortable SKIN: no acute rashes, no significant lesions EYES: conjunctiva are pink and non-injected, sclera anicteric OROPHARYNX: MMM, no exudates, no oropharyngeal erythema or ulceration NECK: supple, no JVD LYMPH:  no palpable lymphadenopathy in the cervical, axillary or inguinal regions LUNGS: clear to auscultation b/l with normal respiratory effort HEART: regular rate & rhythm ABDOMEN:  normoactive bowel sounds , non tender, not distended. Extremity: (+) 2+ pitting edema in LE PSYCH: alert & oriented x 3 with fluent speech NEURO: no focal motor/sensory deficits   LABORATORY DATA:  I have reviewed the data as listed  . CBC Latest Ref Rng & Units 10/21/2017 10/16/2017 06/26/2017  WBC 4.0 - 10.3 K/uL 4.8 4.1 9.4  Hemoglobin 13.0 - 17.1 g/dL 11.8(L) 12.6(L) 13.3  Hematocrit 38.4 - 49.9 % 33.2(L) 37.2(L) 38.9(L)  Platelets 140 - 400 K/uL 202 203 263.0    . CMP Latest Ref Rng & Units 10/21/2017 07/17/2017 06/26/2017  Glucose 70 - 140 mg/dL 101 - 145(H)  BUN 7 - 26 mg/dL 26 - 19  Creatinine 0.70 - 1.30 mg/dL 1.21 1.08 0.97  Sodium 136 - 145 mmol/L 131(L) - 136  Potassium 3.5 - 5.1 mmol/L 4.6 - 4.0  Chloride 98 - 109 mmol/L 98 - 99  CO2 22 - 29 mmol/L 24 - 28  Calcium 8.4 - 10.4  mg/dL 10.4 - 10.0  Total Protein 6.4 - 8.3 g/dL 7.7 7.1 7.9  Total Bilirubin 0.2 - 1.2 mg/dL 0.5 - 0.6  Alkaline Phos 40 - 150 U/L 41 - 37(L)  AST 5 - 34 U/L 25 - 20  ALT 0 - 55 U/L 20 - 18    OUTSIDE LABS from 08/06/17        Component     Latest Ref Rng & Units 10/21/2017  IgG (Immunoglobin G), Serum     700 - 1,600 mg/dL 1,182  IgA     61 - 437 mg/dL 265  IgM (Immunoglobulin M), Srm     20 - 172 mg/dL 30  Total Protein ELP     6.0 - 8.5 g/dL 7.0  Albumin SerPl Elph-Mcnc     2.9 - 4.4 g/dL 3.8  Alpha 1     0.0 - 0.4 g/dL 0.3  Alpha2 Glob SerPl Elph-Mcnc     0.4 - 1.0 g/dL 0.9  B-Globulin SerPl Elph-Mcnc     0.7 - 1.3 g/dL 1.0  Gamma Glob SerPl Elph-Mcnc     0.4 - 1.8 g/dL 1.0  M Protein SerPl Elph-Mcnc     Not Observed g/dL Not Observed  Globulin, Total  2.2 - 3.9 g/dL 3.2  Albumin/Glob SerPl     0.7 - 1.7 1.2  IFE 1      Comment  Please Note (HCV):      Comment  Kappa free light chain     3.3 - 19.4 mg/L 46.7 (H)  Lamda free light chains     5.7 - 26.3 mg/L 20.4  Kappa, lamda light chain ratio     0.26 - 1.65 2.29 (H)  Creatinine, Urine     mg/dL 51.73  Total Protein, Urine     mg/dL 126  Protein Creatinine Ratio     0.00 - 0.15 mg/mgCre 2.44 (H)  Sed Rate     0 - 16 mm/hr 58 (H)  LDH     125 - 245 U/L 165   Multiple Myeloma Panel (SPEP&IFE w/QIG)      The value has a corrected status.      No reference range information available      Resulting Lab: [facility]      Comments: An apparent normal immunofixation pattern.    ADIOGRAPHIC STUDIES: I have personally reviewed the radiological images as listed and agreed with the findings in the report. US Biopsy (kidney)  Result Date: 10/16/2017 INDICATION: 67 year old with proteinuria.  Request for renal biopsy. EXAM: ULTRASOUND-GUIDED RIGHT RENAL BIOPSY MEDICATIONS: None. ANESTHESIA/SEDATION: Moderate (conscious) sedation was employed during this procedure. A total of Versed  1.5 mg and Fentanyl 100 mcg was administered intravenously. Moderate Sedation Time: 22 minutes. The patient's level of consciousness and vital signs were monitored continuously by radiology nursing throughout the procedure under my direct supervision. FLUOROSCOPY TIME:  None COMPLICATIONS: None immediate. PROCEDURE: Informed written consent was obtained from the patient after a thorough discussion of the procedural risks, benefits and alternatives. All questions were addressed. A timeout was performed prior to the initiation of the procedure. Both kidneys were evaluated with ultrasound. The right kidney was selected for biopsy. The right flank was prepped with chlorhexidine and sterile field was created. Skin and soft tissues were anesthetized with 1% lidocaine. Using real-time ultrasound guidance, 16 gauge core needle was directed into the right kidney lower pole and core biopsy was obtained. Specimen placed in saline. A second core biopsy was obtained with ultrasound guidance with the 16 gauge needle. Second core biopsy was adequate and placed in saline. Post biopsy ultrasound images were obtained. Bandage placed over the puncture site. Patient was hemodynamically stable throughout the procedure. FINDINGS: Core biopsies obtained from the right kidney lower pole. Question a small hematoma in the perinephric space identified immediately following the core biopsies. This area was watched for a few minutes and did not appear to be enlarging. No evidence for active bleeding. IMPRESSION: Successful ultrasound-guided core biopsies of the right kidney lower pole. Electronically Signed   By: Markus Daft M.D.   On: 10/16/2017 09:25    ASSESSMENT & PLAN:    TORBEN SOLOWAY is a 67 y.o. caucasian male with   1. Elevated Kappa Light Chain with slight increase in serum kappa lambda light chain ratio of 2.2. SPEP did not show any overt monoclonal protein spikes. Evaluate for plasma cell dyscrasia in the setting of  nephrotic syndrome. US Renal 08/25/17 shows enlargement of kidney's R>L, with moderate distention of bladder  PLAN:  -Discussed pt labwork today, 10/21/17; anemia worsened slightly to 11.8, and blood counts and chemistries are otherwise stable. -Discussed 10/16/17 Renal bx which did not reveal any evidence of not related to a  plasma cell disorder, however diabetic nephrosclerosis and moderate aterionephrosclerosis was noted.  -AL Amyloidosis not currently indicated -Discussed primary concern of nephrotic syndrome with pt and emphasized treatment of his diabetes -Increased risk of blood clots with nephrotic syndrome, but pt is already taking Eliquis -Focus will shift to better controlling his diabetes to preserve health of his kidneys and reduce proteinuria  -Will see pt back as needed -labs from today reviewed.  -Continue follow up with Dr Conni Slipper and Dr Jenny Reichmann   RTC with Dr Irene Limbo as needed   All of the patients questions were answered with apparent satisfaction. The patient knows to call the clinic with any problems, questions or concerns.  . The total time spent in the appointment was 25 minutes and more than 50% was on counseling and direct patient cares.      Sullivan Lone MD MS AAHIVMS [facility] Hematology/Oncology Physician [facility]  (Office):       [phone] (Work cell):  [phone] (Fax):           [phone]  10/21/2017 1:52 PM  I, Baldwin [place], am acting as a Education administrator for Dr Irene Limbo.   .I have reviewed the above documentation for accuracy and completeness, and I agree with the above. Brunetta Genera MD

## 2017-10-21 ENCOUNTER — Telehealth: Payer: Self-pay

## 2017-10-21 ENCOUNTER — Encounter: Payer: Self-pay | Admitting: Hematology

## 2017-10-21 ENCOUNTER — Inpatient Hospital Stay: Payer: Medicare Other

## 2017-10-21 ENCOUNTER — Inpatient Hospital Stay: Payer: Medicare Other | Attending: Hematology | Admitting: Hematology

## 2017-10-21 VITALS — BP 148/62 | HR 58 | Temp 97.8°F | Resp 18 | Ht 70.0 in | Wt 267.6 lb

## 2017-10-21 DIAGNOSIS — D649 Anemia, unspecified: Secondary | ICD-10-CM

## 2017-10-21 DIAGNOSIS — E1121 Type 2 diabetes mellitus with diabetic nephropathy: Secondary | ICD-10-CM

## 2017-10-21 DIAGNOSIS — E1122 Type 2 diabetes mellitus with diabetic chronic kidney disease: Secondary | ICD-10-CM

## 2017-10-21 DIAGNOSIS — N189 Chronic kidney disease, unspecified: Secondary | ICD-10-CM | POA: Diagnosis not present

## 2017-10-21 DIAGNOSIS — I1310 Hypertensive heart and chronic kidney disease without heart failure, with stage 1 through stage 4 chronic kidney disease, or unspecified chronic kidney disease: Secondary | ICD-10-CM | POA: Diagnosis not present

## 2017-10-21 DIAGNOSIS — Z7984 Long term (current) use of oral hypoglycemic drugs: Secondary | ICD-10-CM | POA: Insufficient documentation

## 2017-10-21 DIAGNOSIS — D472 Monoclonal gammopathy: Secondary | ICD-10-CM

## 2017-10-21 DIAGNOSIS — R809 Proteinuria, unspecified: Secondary | ICD-10-CM | POA: Diagnosis not present

## 2017-10-21 DIAGNOSIS — N049 Nephrotic syndrome with unspecified morphologic changes: Secondary | ICD-10-CM

## 2017-10-21 LAB — CMP (CANCER CENTER ONLY)
ALK PHOS: 41 U/L (ref 40–150)
ALT: 20 U/L (ref 0–55)
AST: 25 U/L (ref 5–34)
Albumin: 4 g/dL (ref 3.5–5.0)
Anion gap: 9 (ref 3–11)
BILIRUBIN TOTAL: 0.5 mg/dL (ref 0.2–1.2)
BUN: 26 mg/dL (ref 7–26)
CALCIUM: 10.4 mg/dL (ref 8.4–10.4)
CO2: 24 mmol/L (ref 22–29)
CREATININE: 1.21 mg/dL (ref 0.70–1.30)
Chloride: 98 mmol/L (ref 98–109)
GFR, Estimated: 59 mL/min — ABNORMAL LOW (ref >60)
Glucose, Bld: 101 mg/dL (ref 70–140)
Potassium: 4.6 mmol/L (ref 3.5–5.1)
Sodium: 131 mmol/L — ABNORMAL LOW (ref 136–145)
Total Protein: 7.7 g/dL (ref 6.4–8.3)

## 2017-10-21 LAB — CBC WITH DIFFERENTIAL (CANCER CENTER ONLY)
BASOS PCT: 2 %
Basophils Absolute: 0.1 10*3/uL (ref 0.0–0.1)
EOS ABS: 0.3 10*3/uL (ref 0.0–0.5)
Eosinophils Relative: 5 %
HCT: 33.2 % — ABNORMAL LOW (ref 38.4–49.9)
HEMOGLOBIN: 11.8 g/dL — AB (ref 13.0–17.1)
Lymphocytes Relative: 18 %
Lymphs Abs: 0.9 10*3/uL (ref 0.9–3.3)
MCH: 32.8 pg (ref 27.2–33.4)
MCHC: 35.5 g/dL (ref 32.0–36.0)
MCV: 92.2 fL (ref 79.3–98.0)
MONOS PCT: 10 %
Monocytes Absolute: 0.5 10*3/uL (ref 0.1–0.9)
NEUTROS PCT: 65 %
Neutro Abs: 3.1 10*3/uL (ref 1.5–6.5)
Platelet Count: 202 10*3/uL (ref 140–400)
RBC: 3.6 MIL/uL — AB (ref 4.20–5.82)
RDW: 12.2 % (ref 11.0–14.6)
WBC Count: 4.8 10*3/uL (ref 4.0–10.3)

## 2017-10-21 LAB — LACTATE DEHYDROGENASE: LDH: 165 U/L (ref 125–245)

## 2017-10-21 LAB — PROTEIN / CREATININE RATIO, URINE
Creatinine, Urine: 51.73 mg/dL
Protein Creatinine Ratio: 2.44 mg/mg{Cre} — ABNORMAL HIGH (ref 0.00–0.15)
Total Protein, Urine: 126 mg/dL

## 2017-10-21 LAB — RETICULOCYTES
RBC.: 3.6 MIL/uL — AB (ref 4.20–5.82)
RETIC CT PCT: 1.9 % — AB (ref 0.8–1.8)
Retic Count, Absolute: 68.4 10*3/uL (ref 34.8–93.9)

## 2017-10-21 LAB — SEDIMENTATION RATE: Sed Rate: 58 mm/hr — ABNORMAL HIGH (ref 0–16)

## 2017-10-21 NOTE — Telephone Encounter (Signed)
RTC with Dr Irene Limbo as needed. Per 6/18 los

## 2017-10-22 LAB — KAPPA/LAMBDA LIGHT CHAINS
KAPPA, LAMDA LIGHT CHAIN RATIO: 2.29 — AB (ref 0.26–1.65)
Kappa free light chain: 46.7 mg/L — ABNORMAL HIGH (ref 3.3–19.4)
Lambda free light chains: 20.4 mg/L (ref 5.7–26.3)

## 2017-10-23 ENCOUNTER — Encounter (HOSPITAL_COMMUNITY): Payer: Self-pay

## 2017-10-23 DIAGNOSIS — L720 Epidermal cyst: Secondary | ICD-10-CM | POA: Diagnosis not present

## 2017-10-23 DIAGNOSIS — L718 Other rosacea: Secondary | ICD-10-CM | POA: Diagnosis not present

## 2017-10-23 DIAGNOSIS — L0889 Other specified local infections of the skin and subcutaneous tissue: Secondary | ICD-10-CM | POA: Diagnosis not present

## 2017-10-23 DIAGNOSIS — L570 Actinic keratosis: Secondary | ICD-10-CM | POA: Diagnosis not present

## 2017-10-23 LAB — MULTIPLE MYELOMA PANEL, SERUM
ALBUMIN SERPL ELPH-MCNC: 3.8 g/dL (ref 2.9–4.4)
ALPHA2 GLOB SERPL ELPH-MCNC: 0.9 g/dL (ref 0.4–1.0)
Albumin/Glob SerPl: 1.2 (ref 0.7–1.7)
Alpha 1: 0.3 g/dL (ref 0.0–0.4)
B-Globulin SerPl Elph-Mcnc: 1 g/dL (ref 0.7–1.3)
Gamma Glob SerPl Elph-Mcnc: 1 g/dL (ref 0.4–1.8)
Globulin, Total: 3.2 g/dL (ref 2.2–3.9)
IGG (IMMUNOGLOBIN G), SERUM: 1182 mg/dL (ref 700–1600)
IgA: 265 mg/dL (ref 61–437)
IgM (Immunoglobulin M), Srm: 30 mg/dL (ref 20–172)
TOTAL PROTEIN ELP: 7 g/dL (ref 6.0–8.5)

## 2017-11-04 NOTE — Progress Notes (Signed)
Chief Complaint  Patient presents with  . Follow-up    atrial fibrillation    History of Present Illness: 67 yo male with history of HTN, HLD, DM, atrial fibrillation, PAD, AAA status post open repair 2012, remote CVA, GERD here today for cardiac follow up. He was found to have atrial fibrillation in 2016 while in the hospital for a PV procedure. He has been on Eliquis and Plavix. He has not been on AV nodal blocking agents. His PAD is followed by Dr. Trula Slade.  Nuclear stress test 2010 with no ischemia. Echo July 2016 with normal LV systolic function, RRNH=65-79%. His wife is also my patient.   He is here today for follow up. The patient denies any chest pain, dyspnea, palpitations, lower extremity edema, orthopnea, PND, dizziness, near syncope or syncope.   Primary Care Physician: Biagio Borg, MD   Past Medical History:  Diagnosis Date  . AAA (abdominal aortic aneurysm) ([place])   . 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 ([place]) 09/07/2008  . Anticardiolipin antibody positive   . Anxiety   . ANXIETY 07/19/2008  . CEREBROVASCULAR ACCIDENT, HX OF 07/19/2008  . CVA (cerebral vascular accident) ([place]) 2003  . Depression   . DEPRESSION 07/19/2008  . Diabetes mellitus    type II  . DIABETES MELLITUS, TYPE II 01/18/2008  . Diverticulosis   . GERD 07/19/2008  . GERD (gastroesophageal reflux disease)   . Hemorrhoids   . Hx of adenomatous colonic polyps   . Hyperlipidemia   . HYPERLIPIDEMIA 07/19/2008  . Hypertension   . HYPERTENSION 07/31/2009  . Increased prostate specific antigen (PSA) velocity 09/13/2010  . Internal hemorrhoids   . Perineal abscess   . Peripheral vascular disease ([place])    peripheral vascular disease/carotid artery <100%  . PERSONAL HX COLONIC POLYPS 12/10/2007  . Unspecified Peripheral Vascular Disease 01/18/2008  . Varicose veins     Past Surgical History:  Procedure Laterality Date  . 7 abdominal hernia repairs  05/31/10  . ABDOMINAL AORTAGRAM N/A 01/12/2013   Procedure: ABDOMINAL Maxcine Ham;  Surgeon: Serafina Mitchell, MD;  Location: [facility] CATH LAB;  Service: Cardiovascular;  Laterality: N/A;  . ABDOMINAL AORTIC ANEURYSM REPAIR    . COLONOSCOPY    . hernia surgury     67 years old  . perineal abscess    . PERIPHERAL VASCULAR CATHETERIZATION N/A 11/23/2014   Procedure: Abdominal Aortogram;  Surgeon: Serafina Mitchell, MD;  Location: [facility] CV LAB;  Service: Cardiovascular;  Laterality: N/A;  . PERIPHERAL VASCULAR CATHETERIZATION  11/23/2014   Procedure: Lower Extremity Angiography;  Surgeon: Serafina Mitchell, MD;  Location: [place] CV LAB;  Service: Cardiovascular;;  . PERIPHERAL VASCULAR CATHETERIZATION  11/23/2014   Procedure: Peripheral Vascular Intervention;  Surgeon: Serafina Mitchell, MD;  Location: [place] CV LAB;  Service: Cardiovascular;;  PTA bilateral common iliac PTA/DCB left fem-pop  . POLYPECTOMY    . 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 SP ENDO REPAIR INFRAREN AAA      Current Outpatient Medications  Medication Sig Dispense Refill  . amLODipine (NORVASC) 5 MG tablet Take 5 mg by mouth 2 (two) times daily.    Marland Kitchen apixaban (ELIQUIS) 5 MG TABS tablet Take 1 tablet (5 mg total) by mouth 2 (two) times daily. 180 tablet 3  . ascorbic Acid (VITAMIN C CR) 500 MG CPCR Take 500 mg by mouth daily.     Marland Kitchen atorvastatin (LIPITOR) 40 MG tablet Take 1 tablet (40  mg total) by mouth daily. 90 tablet 3  . cetirizine (ZYRTEC) 10 MG tablet Take 1 tablet (10 mg total) by mouth daily. 90 tablet 3  . clopidogrel (PLAVIX) 75 MG tablet Take 1 tablet (75 mg total) by mouth daily. 90 tablet 3  . docusate sodium (COLACE) 100 MG capsule Take 100 mg by mouth 2 (two) times daily.     . Fiber CAPS Take 5.7 mg by mouth See admin instructions. Take (10) 0.57 capsules by mouth daily for regularity.    . folic acid (FOLVITE) 1 MG tablet Take 1 tablet (1 mg total) by mouth daily. 90 tablet 3  . gemfibrozil (LOPID) 600 MG tablet Take 2 tablets (1,200 mg  total) by mouth daily. 180 tablet 3  . metFORMIN (GLUCOPHAGE-XR) 500 MG 24 hr tablet Take 4 tablets (2,000 mg total) by mouth daily. 4 tabs by mouth in the AM 360 tablet 3  . metroNIDAZOLE (METROGEL) 0.75 % gel Apply 1 application topically daily.     . Multiple Vitamin (MULTIVITAMIN) capsule Take 1 capsule by mouth daily.     Marland Kitchen olmesartan-hydrochlorothiazide (BENICAR HCT) 40-25 MG tablet Take 1 tablet by mouth daily.    . Omega-3 Fatty Acids (FISH OIL PO) Take 1 capsule by mouth daily.     . pioglitazone (ACTOS) 30 MG tablet Take 1 tablet (30 mg total) by mouth daily. 90 tablet 3  . triamcinolone (NASACORT) 55 MCG/ACT AERO nasal inhaler Place 2 sprays into the nose daily. (Patient taking differently: Place 1 spray into the nose at bedtime. ) 1 Inhaler 12  . ranitidine (ZANTAC) 150 MG tablet Take 1 tablet (150 mg total) by mouth 2 (two) times daily. 180 tablet 3   No current facility-administered medications for this visit.     Allergies  Allergen Reactions  . Latex Rash    Social History   Socioeconomic History  . Marital status: Married    Spouse name: Not on file  . Number of children: 1  . Years of education: Not on file  . Highest education level: Not on file  Occupational History  . Occupation: dsiabled due to stroke    Employer: RETIRED  Social Needs  . Financial resource strain: Not on file  . Food insecurity:    Worry: Not on file    Inability: Not on file  . Transportation needs:    Medical: Not on file    Non-medical: Not on file  Tobacco Use  . Smoking status: Former Smoker    Types: Cigarettes    Last attempt to quit: 01/14/1992    Years since quitting: 25.8  . Smokeless tobacco: Never Used  Substance and Sexual Activity  . Alcohol use: Yes    Alcohol/week: 1.2 oz    Types: 2 Glasses of wine per week    Comment: wine daily  . Drug use: No  . Sexual activity: Not on file  Lifestyle  . Physical activity:    Days per week: Not on file    Minutes per  session: Not on file  . Stress: Not on file  Relationships  . Social connections:    Talks on phone: Not on file    Gets together: Not on file    Attends religious service: Not on file    Active member of club or organization: Not on file    Attends meetings of clubs or organizations: Not on file    Relationship status: Not on file  . Intimate partner violence:  Fear of current or ex partner: Not on file    Emotionally abused: Not on file    Physically abused: Not on file    Forced sexual activity: Not on file  Other Topics Concern  . Not on file  Social History Narrative   2 cups of coffee in the morning and tea throughout the day.    Family History  Problem Relation Age of Onset  . Aortic aneurysm Mother        desceding aoritic aneurysm  . Cancer Brother        Lung cancer  . Colon cancer Neg Hx   . Esophageal cancer Neg Hx   . Stomach cancer Neg Hx   . Rectal cancer Neg Hx     Review of Systems:  As stated in the HPI and otherwise negative.   BP 130/78   Pulse 61   Ht 5\' 10"  (1.778 m)   Wt 270 lb 6.4 oz (122.7 kg)   SpO2 98%   BMI 38.80 kg/m   Physical Examination:  General: Well developed, well nourished, NAD  HEENT: OP clear, mucus membranes moist  SKIN: warm, dry. No rashes. Neuro: No focal deficits  Musculoskeletal: Muscle strength 5/5 all ext  Psychiatric: Mood and affect normal  Neck: No JVD, no carotid bruits, no thyromegaly, no lymphadenopathy.  Lungs:Clear bilaterally, no wheezes, rhonci, crackles Cardiovascular: Irreg irreg No murmurs, gallops or rubs. Abdomen:Soft. Bowel sounds present. Non-tender.  Extremities: No lower extremity edema. Pulses are 2 + in the bilateral DP/PT.  Echo July 2016: Left ventricle: The cavity size was normal. Wall thickness was   normal. Systolic function was normal. The estimated ejection   fraction was in the range of 55% to 60%. Wall motion was normal;   there were no regional wall motion abnormalities. -  Ascending aorta: The ascending aorta was mildly dilated. - Mitral valve: There was mild regurgitation. - Left atrium: The atrium was moderately dilated. - Right atrium: The atrium was mildly dilated.  EKG:  EKG is ordered today. The ekg ordered today demonstrates atrial fibrillation, rate 61 bpm   Recent Labs: 10/21/2017: ALT 20; BUN 26; Creatinine 1.21; Hemoglobin 11.8; Platelet Count 202; Potassium 4.6; Sodium 131   Lipid Panel    Component Value Date/Time   CHOL 121 06/26/2017 0902   TRIG 182.0 (H) 06/26/2017 0902   HDL 33.60 (L) 06/26/2017 0902   CHOLHDL 4 06/26/2017 0902   VLDL 36.4 06/26/2017 0902   LDLCALC 51 06/26/2017 0902   LDLDIRECT 51.0 03/15/2016 0816     Wt Readings from Last 3 Encounters:  11/07/17 270 lb 6.4 oz (122.7 kg)  10/21/17 267 lb 9.6 oz (121.4 kg)  10/16/17 262 lb (118.8 kg)     Other studies Reviewed: Additional studies/ records that were reviewed today include: all old records, hospital, echo images.  Review of the above records demonstrates:    Assessment and Plan:   1. Persistent atrial fibrillation: He is in atrial fibrillation today and his rate is 61 bpm. He is not on rate control agents. Will continue Eliquis.    2. Essential hypertension: BP is well controlled. No changes today.   3. HLD: Lipids followed in primary care. LDL is below 70. Continue statin.   4. Peripheral vascular disease/carotid disease, AAA: Followed in [facility]  5. Mitral regurgitation: mild by echo 2016. Will repeat echo in 2020.   Current medicines are reviewed at length with the patient today.  The patient does not have concerns regarding  medicines.  The following changes have been made:  no change  Labs/ tests ordered today include:   No orders of the defined types were placed in this encounter.   Disposition:   FU with me in 12 months  Signed, Lauree Chandler, MD 11/07/2017 12:05 PM    [address],  [place], [place]  [address] Phone: [phone]; Fax: [phone]

## 2017-11-07 ENCOUNTER — Ambulatory Visit (INDEPENDENT_AMBULATORY_CARE_PROVIDER_SITE_OTHER): Payer: Medicare Other | Admitting: Cardiovascular Disease

## 2017-11-07 ENCOUNTER — Encounter: Payer: Self-pay | Admitting: Cardiovascular Disease

## 2017-11-07 VITALS — BP 130/78 | HR 61 | Ht 70.0 in | Wt 270.4 lb

## 2017-11-07 DIAGNOSIS — I10 Essential (primary) hypertension: Secondary | ICD-10-CM | POA: Diagnosis not present

## 2017-11-07 DIAGNOSIS — I481 Persistent atrial fibrillation: Secondary | ICD-10-CM | POA: Diagnosis not present

## 2017-11-07 DIAGNOSIS — E7800 Pure hypercholesterolemia, unspecified: Secondary | ICD-10-CM

## 2017-11-07 DIAGNOSIS — I340 Nonrheumatic mitral (valve) insufficiency: Secondary | ICD-10-CM | POA: Diagnosis not present

## 2017-11-07 DIAGNOSIS — I4819 Other persistent atrial fibrillation: Secondary | ICD-10-CM

## 2017-11-07 MED ORDER — APIXABAN 5 MG PO TABS: 5.0000 mg | ORAL_TABLET | Freq: Two times a day (BID) | ORAL | 3 refills | Status: DC

## 2017-11-07 NOTE — Patient Instructions (Signed)

## 2017-11-10 DIAGNOSIS — R809 Proteinuria, unspecified: Secondary | ICD-10-CM | POA: Diagnosis not present

## 2017-11-10 DIAGNOSIS — I129 Hypertensive chronic kidney disease with stage 1 through stage 4 chronic kidney disease, or unspecified chronic kidney disease: Secondary | ICD-10-CM | POA: Diagnosis not present

## 2017-11-10 NOTE — Addendum Note (Signed)
Addended by: Jacinta Shoe on: 11/10/2017 09:59 AM   Modules accepted: Orders

## 2017-12-16 ENCOUNTER — Other Ambulatory Visit (INDEPENDENT_AMBULATORY_CARE_PROVIDER_SITE_OTHER): Payer: Medicare Other

## 2017-12-16 ENCOUNTER — Telehealth: Payer: Self-pay

## 2017-12-16 DIAGNOSIS — I10 Essential (primary) hypertension: Secondary | ICD-10-CM | POA: Diagnosis not present

## 2017-12-16 DIAGNOSIS — Z0000 Encounter for general adult medical examination without abnormal findings: Secondary | ICD-10-CM

## 2017-12-16 DIAGNOSIS — E1149 Type 2 diabetes mellitus with other diabetic neurological complication: Secondary | ICD-10-CM | POA: Diagnosis not present

## 2017-12-16 LAB — CBC WITH DIFFERENTIAL/PLATELET
Basophils Absolute: 0.1 10*3/uL (ref 0.0–0.1)
Basophils Relative: 1.8 % (ref 0.0–3.0)
EOS ABS: 0.4 10*3/uL (ref 0.0–0.7)
Eosinophils Relative: 9.7 % — ABNORMAL HIGH (ref 0.0–5.0)
HCT: 35.9 % — ABNORMAL LOW (ref 39.0–52.0)
Hemoglobin: 12.6 g/dL — ABNORMAL LOW (ref 13.0–17.0)
Lymphocytes Relative: 19.8 % (ref 12.0–46.0)
Lymphs Abs: 0.7 10*3/uL (ref 0.7–4.0)
MCHC: 35.1 g/dL (ref 30.0–36.0)
MCV: 95.3 fl (ref 78.0–100.0)
MONO ABS: 0.4 10*3/uL (ref 0.1–1.0)
Monocytes Relative: 10.1 % (ref 3.0–12.0)
NEUTROS ABS: 2.2 10*3/uL (ref 1.4–7.7)
Neutrophils Relative %: 58.6 % (ref 43.0–77.0)
PLATELETS: 213 10*3/uL (ref 150.0–400.0)
RBC: 3.77 Mil/uL — ABNORMAL LOW (ref 4.22–5.81)
RDW: 13.1 % (ref 11.5–15.5)
WBC: 3.8 10*3/uL — ABNORMAL LOW (ref 4.0–10.5)

## 2017-12-16 LAB — LIPID PANEL
Cholesterol: 113 mg/dL (ref 0–200)
HDL: 36.4 mg/dL — ABNORMAL LOW (ref >39.00)
LDL Cholesterol: 47 mg/dL (ref 0–99)
NonHDL: 76.3
Total CHOL/HDL Ratio: 3
Triglycerides: 148 mg/dL (ref 0.0–149.0)
VLDL: 29.6 mg/dL (ref 0.0–40.0)

## 2017-12-16 LAB — BASIC METABOLIC PANEL
BUN: 26 mg/dL — AB (ref 6–23)
CALCIUM: 9.8 mg/dL (ref 8.4–10.5)
CO2: 27 mEq/L (ref 19–32)
CREATININE: 1.19 mg/dL (ref 0.40–1.50)
Chloride: 100 mEq/L (ref 96–112)
GFR: 64.38 mL/min (ref >60.00)
Glucose, Bld: 130 mg/dL — ABNORMAL HIGH (ref 70–99)
Potassium: 4.3 mEq/L (ref 3.5–5.1)
Sodium: 134 mEq/L — ABNORMAL LOW (ref 135–145)

## 2017-12-16 LAB — HEPATIC FUNCTION PANEL
ALK PHOS: 34 U/L — AB (ref 39–117)
ALT: 15 U/L (ref 0–53)
AST: 19 U/L (ref 0–37)
Albumin: 4.1 g/dL (ref 3.5–5.2)
BILIRUBIN DIRECT: 0.1 mg/dL (ref 0.0–0.3)
Total Bilirubin: 0.4 mg/dL (ref 0.2–1.2)
Total Protein: 7.5 g/dL (ref 6.0–8.3)

## 2017-12-16 LAB — HEMOGLOBIN A1C: Hgb A1c MFr Bld: 5.6 % (ref 4.6–6.5)

## 2017-12-16 LAB — TSH: TSH: 1.83 u[IU]/mL (ref 0.35–4.50)

## 2017-12-16 NOTE — Telephone Encounter (Signed)
Lab orders needed to be entered for upcoming appt.

## 2017-12-16 NOTE — Telephone Encounter (Signed)
Patient has medicare but was persistent with Tonya (lab) about having them drawn prior to appointment. Patient has been informed that it is a chance that he will receive a bill because medicare only pay for labs on the day of appointment. Pt appointment is 12/17/17.

## 2017-12-16 NOTE — Addendum Note (Signed)
Addended by: Juliet Rude on: 12/16/2017 09:10 AM   Modules accepted: Orders

## 2017-12-17 ENCOUNTER — Ambulatory Visit (INDEPENDENT_AMBULATORY_CARE_PROVIDER_SITE_OTHER): Payer: Medicare Other | Admitting: Internal Medicine

## 2017-12-17 ENCOUNTER — Encounter: Payer: Self-pay | Admitting: Internal Medicine

## 2017-12-17 VITALS — BP 134/82 | HR 65 | Temp 97.8°F | Ht 70.0 in | Wt 264.0 lb

## 2017-12-17 DIAGNOSIS — E785 Hyperlipidemia, unspecified: Secondary | ICD-10-CM | POA: Diagnosis not present

## 2017-12-17 DIAGNOSIS — E1121 Type 2 diabetes mellitus with diabetic nephropathy: Secondary | ICD-10-CM

## 2017-12-17 DIAGNOSIS — I10 Essential (primary) hypertension: Secondary | ICD-10-CM

## 2017-12-17 MED ORDER — METFORMIN HCL ER 500 MG PO TB24: 2000.0000 mg | ORAL_TABLET | Freq: Every day | ORAL | 3 refills | Status: DC

## 2017-12-17 MED ORDER — OLMESARTAN MEDOXOMIL-HCTZ 40-25 MG PO TABS: 1.0000 | ORAL_TABLET | Freq: Every day | ORAL | 3 refills | Status: DC

## 2017-12-17 MED ORDER — CLOPIDOGREL BISULFATE 75 MG PO TABS: 75.0000 mg | ORAL_TABLET | Freq: Every day | ORAL | 3 refills | Status: DC

## 2017-12-17 MED ORDER — PIOGLITAZONE HCL 30 MG PO TABS: 30.0000 mg | ORAL_TABLET | Freq: Every day | ORAL | 3 refills | Status: DC

## 2017-12-17 MED ORDER — GEMFIBROZIL 600 MG PO TABS: 1200.0000 mg | ORAL_TABLET | Freq: Every day | ORAL | 3 refills | Status: DC

## 2017-12-17 NOTE — Progress Notes (Signed)
Subjective:    Patient ID: David [name], male    DOB: [birth year], 67 y.o.   MRN: [id number]  HPI  Here for yearly f/u;  Overall doing ok;  Pt denies Chest pain, worsening SOB, DOE, wheezing, orthopnea, PND, worsening LE edema, palpitations, dizziness or syncope.  Pt denies neurological change such as new headache, facial or extremity weakness.  Pt denies polydipsia, polyuria, or low sugar symptoms. Pt states overall good compliance with treatment and medications, good tolerability, and has been trying to follow appropriate diet.  Pt denies worsening depressive symptoms, suicidal ideation or panic. No fever, night sweats, wt loss, loss of appetite, or other constitutional symptoms.  Pt states good ability with ADL's, has low fall risk, home safety reviewed and adequate, no other significant changes in hearing or vision, and not active with exercise. Wt Readings from Last 3 Encounters:  12/17/17 264 lb (119.7 kg)  11/07/17 270 lb 6.4 oz (122.7 kg)  10/21/17 267 lb 9.6 oz (121.4 kg)  s/p renal bx June 2019 at [facility] with path c/w diabetic glomerulosclerosis.  No new complaints Past Medical History:  Diagnosis Date  . AAA (abdominal aortic aneurysm) ([place])   . 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 ([place]) 09/07/2008  . Anticardiolipin antibody positive   . Anxiety   . ANXIETY 07/19/2008  . CEREBROVASCULAR ACCIDENT, HX OF 07/19/2008  . CVA (cerebral vascular accident) ([place]) 2003  . Depression   . DEPRESSION 07/19/2008  . Diabetes mellitus    type II  . DIABETES MELLITUS, TYPE II 01/18/2008  . Diverticulosis   . GERD 07/19/2008  . GERD (gastroesophageal reflux disease)   . Hemorrhoids   . Hx of adenomatous colonic polyps   . Hyperlipidemia   . HYPERLIPIDEMIA 07/19/2008  . Hypertension   . HYPERTENSION 07/31/2009  . Increased prostate specific antigen (PSA) velocity 09/13/2010  . Internal hemorrhoids   . Perineal abscess   . Peripheral vascular disease ([place])    peripheral vascular disease/carotid  artery <100%  . PERSONAL HX COLONIC POLYPS 12/10/2007  . Unspecified Peripheral Vascular Disease 01/18/2008  . Varicose veins    Past Surgical History:  Procedure Laterality Date  . 7 abdominal hernia repairs  05/31/10  . ABDOMINAL AORTAGRAM N/A 01/12/2013   Procedure: ABDOMINAL Maxcine Ham;  Surgeon: Serafina Mitchell, MD;  Location: [facility] CATH LAB;  Service: Cardiovascular;  Laterality: N/A;  . ABDOMINAL AORTIC ANEURYSM REPAIR    . COLONOSCOPY    . hernia surgury     67 years old  . perineal abscess    . PERIPHERAL VASCULAR CATHETERIZATION N/A 11/23/2014   Procedure: Abdominal Aortogram;  Surgeon: Serafina Mitchell, MD;  Location: [place] [facility] CV LAB;  Service: Cardiovascular;  Laterality: N/A;  . PERIPHERAL VASCULAR CATHETERIZATION  11/23/2014   Procedure: Lower Extremity Angiography;  Surgeon: Serafina Mitchell, MD;  Location: [place] CV LAB;  Service: Cardiovascular;;  . PERIPHERAL VASCULAR CATHETERIZATION  11/23/2014   Procedure: Peripheral Vascular Intervention;  Surgeon: Serafina Mitchell, MD;  Location: [facility] [place] CV LAB;  Service: Cardiovascular;;  PTA bilateral common iliac PTA/DCB left fem-pop  . POLYPECTOMY    . 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 SP ENDO REPAIR INFRAREN AAA      reports that he quit smoking about 25 years ago. His smoking use included cigarettes. He has never used smokeless tobacco. He reports that he drinks about 2.0 standard drinks of alcohol per week. He reports that he does not use drugs.  family history includes Aortic aneurysm in his mother; Cancer in his brother. Allergies  Allergen Reactions  . Latex Rash   Current Outpatient Medications on File Prior to Visit  Medication Sig Dispense Refill  . amLODipine (NORVASC) 5 MG tablet Take 5 mg by mouth 2 (two) times daily.    Marland Kitchen apixaban (ELIQUIS) 5 MG TABS tablet Take 1 tablet (5 mg total) by mouth 2 (two) times daily. 180 tablet 3  . ascorbic Acid (VITAMIN C CR) 500 MG CPCR Take 500 mg by mouth daily.      Marland Kitchen atorvastatin (LIPITOR) 40 MG tablet Take 1 tablet (40 mg total) by mouth daily. 90 tablet 3  . cetirizine (ZYRTEC) 10 MG tablet Take 1 tablet (10 mg total) by mouth daily. 90 tablet 3  . clopidogrel (PLAVIX) 75 MG tablet Take 1 tablet (75 mg total) by mouth daily. 90 tablet 3  . docusate sodium (COLACE) 100 MG capsule Take 100 mg by mouth 2 (two) times daily.     . Fiber CAPS Take 5.7 mg by mouth See admin instructions. Take (10) 0.57 capsules by mouth daily for regularity.    . folic acid (FOLVITE) 1 MG tablet Take 1 tablet (1 mg total) by mouth daily. 90 tablet 3  . gemfibrozil (LOPID) 600 MG tablet Take 2 tablets (1,200 mg total) by mouth daily. 180 tablet 3  . metFORMIN (GLUCOPHAGE-XR) 500 MG 24 hr tablet Take 4 tablets (2,000 mg total) by mouth daily. 4 tabs by mouth in the AM 360 tablet 3  . metroNIDAZOLE (METROGEL) 0.75 % gel Apply 1 application topically daily.     . Multiple Vitamin (MULTIVITAMIN) capsule Take 1 capsule by mouth daily.     Marland Kitchen olmesartan-hydrochlorothiazide (BENICAR HCT) 40-25 MG tablet Take 1 tablet by mouth daily.    . Omega-3 Fatty Acids (FISH OIL PO) Take 1 capsule by mouth daily.     . pioglitazone (ACTOS) 30 MG tablet Take 1 tablet (30 mg total) by mouth daily. 90 tablet 3  . triamcinolone (NASACORT) 55 MCG/ACT AERO nasal inhaler Place 2 sprays into the nose daily. (Patient taking differently: Place 1 spray into the nose at bedtime. ) 1 Inhaler 12  . ranitidine (ZANTAC) 150 MG tablet Take 1 tablet (150 mg total) by mouth 2 (two) times daily. 180 tablet 3   No current facility-administered medications on file prior to visit.    Review of Systems Constitutional: Negative for other unusual diaphoresis, sweats, appetite or weight changes HENT: Negative for other worsening hearing loss, ear pain, facial swelling, mouth sores or neck stiffness.   Eyes: Negative for other worsening pain, redness or other visual disturbance.  Respiratory: Negative for other stridor or  swelling Cardiovascular: Negative for other palpitations or other chest pain  Gastrointestinal: Negative for worsening diarrhea or loose stools, blood in stool, distention or other pain Genitourinary: Negative for hematuria, flank pain or other change in urine volume.  Musculoskeletal: Negative for myalgias or other joint swelling.  Skin: Negative for other color change, or other wound or worsening drainage.  Neurological: Negative for other syncope or numbness. Hematological: Negative for other adenopathy or swelling Psychiatric/Behavioral: Negative for hallucinations, other worsening agitation, SI, self-injury, or new decreased concentration All other system neg per pt    Objective:   Physical Exam BP 134/82   Pulse 65   Temp 97.8 F (36.6 C) (Oral)   Ht 5\' 10"  (1.778 m)   Wt 264 lb (119.7 kg)   SpO2 97%   BMI  37.88 kg/m  VS noted, obese Constitutional: Pt is oriented to person, place, and time. Appears well-developed and well-nourished, in no significant distress and comfortable Head: Normocephalic and atraumatic  Eyes: Conjunctivae and EOM are normal. Pupils are equal, round, and reactive to light Right Ear: External ear normal without discharge Left Ear: External ear normal without discharge Nose: Nose without discharge or deformity Mouth/Throat: Oropharynx is without other ulcerations and moist  Neck: Normal range of motion. Neck supple. No JVD present. No tracheal deviation present or significant neck LA or mass Cardiovascular: Normal rate, regular rhythm, normal heart sounds and intact distal pulses.   Pulmonary/Chest: WOB normal and breath sounds without rales or wheezing  Abdominal: Soft. Bowel sounds are normal. NT. No HSM  Musculoskeletal: Normal range of motion. Exhibits chronic bilat 1+ ankle edema Lymphadenopathy: Has no other cervical adenopathy.  Neurological: Pt is alert and oriented to person, place, and time. Pt has normal reflexes. No cranial nerve deficit.  Motor grossly intact, Gait intact Skin: Skin is warm and dry. No rash noted or new ulcerations Psychiatric:  Has normal mood and affect. Behavior is normal without agitation No other exam findings Lab Results  Component Value Date   WBC 3.8 (L) 12/16/2017   HGB 12.6 (L) 12/16/2017   HCT 35.9 (L) 12/16/2017   PLT 213.0 12/16/2017   GLUCOSE 130 (H) 12/16/2017   CHOL 113 12/16/2017   TRIG 148.0 12/16/2017   HDL 36.40 (L) 12/16/2017   LDLDIRECT 51.0 03/15/2016   LDLCALC 47 12/16/2017   ALT 15 12/16/2017   AST 19 12/16/2017   NA 134 (L) 12/16/2017   K 4.3 12/16/2017   CL 100 12/16/2017   CREATININE 1.19 12/16/2017   BUN 26 (H) 12/16/2017   CO2 27 12/16/2017   TSH 1.83 12/16/2017   PSA 2.45 03/15/2016   INR 1.17 10/16/2017   HGBA1C 5.6 12/16/2017   MICROALBUR 173.4 (H) 11/12/2016       Assessment & Plan:

## 2017-12-17 NOTE — Assessment & Plan Note (Signed)
stable overall by history and exam, recent data reviewed with pt, and pt to continue medical treatment as before,  to f/u any worsening symptoms or concerns  

## 2017-12-17 NOTE — Patient Instructions (Signed)
Please continue all other medications as before, and refills have been done if requested.  Please have the pharmacy call with any other refills you may need.  Please continue your efforts at being more active, low cholesterol diet, and weight control.  You are otherwise up to date with prevention measures today.  Please keep your appointments with your specialists as you may have planned  Please return in 6 months, or sooner if needed 

## 2017-12-30 ENCOUNTER — Ambulatory Visit: Payer: Medicare Other | Admitting: Family

## 2017-12-30 ENCOUNTER — Encounter (HOSPITAL_COMMUNITY): Payer: Medicare Other

## 2017-12-30 ENCOUNTER — Ambulatory Visit (HOSPITAL_COMMUNITY)
Admission: RE | Admit: 2017-12-30 | Discharge: 2017-12-30 | Disposition: A | Discharge status: Home / Self Care | Payer: Medicare Other | Source: Ambulatory Visit | Attending: Family | Admitting: Family

## 2017-12-30 ENCOUNTER — Ambulatory Visit (INDEPENDENT_AMBULATORY_CARE_PROVIDER_SITE_OTHER): Payer: Medicare Other | Admitting: Family

## 2017-12-30 ENCOUNTER — Encounter: Payer: Self-pay | Admitting: Family

## 2017-12-30 ENCOUNTER — Other Ambulatory Visit (HOSPITAL_COMMUNITY): Payer: Medicare Other

## 2017-12-30 ENCOUNTER — Other Ambulatory Visit: Payer: Self-pay

## 2017-12-30 ENCOUNTER — Ambulatory Visit (INDEPENDENT_AMBULATORY_CARE_PROVIDER_SITE_OTHER)
Admission: RE | Admit: 2017-12-30 | Discharge: 2017-12-30 | Disposition: A | Discharge status: Home / Self Care | Payer: Medicare Other | Source: Ambulatory Visit | Attending: Surgery | Admitting: Surgery

## 2017-12-30 VITALS — BP 156/73 | HR 66 | Resp 20 | Ht 70.0 in | Wt 262.0 lb

## 2017-12-30 DIAGNOSIS — I714 Abdominal aortic aneurysm, without rupture, unspecified: Secondary | ICD-10-CM

## 2017-12-30 DIAGNOSIS — Z95820 Peripheral vascular angioplasty status with implants and grafts: Secondary | ICD-10-CM | POA: Insufficient documentation

## 2017-12-30 DIAGNOSIS — I779 Disorder of arteries and arterioles, unspecified: Secondary | ICD-10-CM

## 2017-12-30 DIAGNOSIS — Z87891 Personal history of nicotine dependence: Secondary | ICD-10-CM

## 2017-12-30 DIAGNOSIS — I739 Peripheral vascular disease, unspecified: Secondary | ICD-10-CM

## 2017-12-30 DIAGNOSIS — I10 Essential (primary) hypertension: Secondary | ICD-10-CM | POA: Diagnosis not present

## 2017-12-30 DIAGNOSIS — Z9889 Other specified postprocedural states: Secondary | ICD-10-CM

## 2017-12-30 NOTE — Patient Instructions (Addendum)
Before your next abdominal ultrasound:  Avoid gas forming foods and beverages the day before the test.   Take two Extra-Strength Gas-X capsules at bedtime the night before the test. Take another two Extra-Strength Gas-X capsules in the middle of the night if you get up to the restroom, if not, first thing in the morning with water.  Do not chew gum.      Peripheral Vascular Disease Peripheral vascular disease (PVD) is a disease of the blood vessels that are not part of your heart and brain. A simple term for PVD is poor circulation. In most cases, PVD narrows the blood vessels that carry blood from your heart to the rest of your body. This can result in a decreased supply of blood to your arms, legs, and internal organs, like your stomach or kidneys. However, it most often affects a person's lower legs and feet. There are two types of PVD.  Organic PVD. This is the more common type. It is caused by damage to the structure of blood vessels.  Functional PVD. This is caused by conditions that make blood vessels contract and tighten (spasm).  Without treatment, PVD tends to get worse over time. PVD can also lead to acute ischemic limb. This is when an arm or limb suddenly has trouble getting enough blood. This is a medical emergency. Follow these instructions at home:  Take medicines only as told by your doctor.  Do not use any tobacco products, including cigarettes, chewing tobacco, or electronic cigarettes. If you need help quitting, ask your doctor.  Lose weight if you are overweight, and maintain a healthy weight as told by your doctor.  Eat a diet that is low in fat and cholesterol. If you need help, ask your doctor.  Exercise regularly. Ask your doctor for some good activities for you.  Take good care of your feet. ? Wear comfortable shoes that fit well. ? Check your feet often for any cuts or sores. Contact a doctor if:  You have cramps in your legs while walking.  You have  leg pain when you are at rest.  You have coldness in a leg or foot.  Your skin changes.  You are unable to get or have an erection (erectile dysfunction).  You have cuts or sores on your feet that are not healing. Get help right away if:  Your arm or leg turns cold and blue.  Your arms or legs become red, warm, swollen, painful, or numb.  You have chest pain or trouble breathing.  You suddenly have weakness in your face, arm, or leg.  You become very confused or you cannot speak.  You suddenly have a very bad headache.  You suddenly cannot see. This information is not intended to replace advice given to you by your health care provider. Make sure you discuss any questions you have with your health care provider. Document Released: 07/17/2009 Document Revised: 09/28/2015 Document Reviewed: 09/30/2013 Elsevier Interactive Patient Education  2017 [employer]  

## 2017-12-30 NOTE — Progress Notes (Signed)
[facility] [place]   CC: Follow up peripheral artery occlusive disease and hx of AAA repair  History of Present Illness David [name] is a 67 y.o. male who is s/p open abdominal aortic aneurysm repair by Dr. Amedeo Plenty in 2012. 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 He had his left popliteal aneurysm repaired by above-knee to below-knee bypass in October of 2010 he's had a midline abdominal hernia repair by Dr. gross. He had elevated velocities in the proximal anastomosis of his bypass graft and underwent arteriogram which revealed a high-grade stenosis extending into his bypass graft balloon angioplasty was performed however this resulted in a dissection and he therefore underwent stenting using a 6 mm stent. He was found to have in-stent stenosis on the left. On 01/12/2013 he underwent angiography with a successful balloon angioplasty of the stent using a 6 mm balloon. Therefore on 11/23/2014 he underwent drug coated balloon angioplasty of the left proximal anastomosis which has previously been stented. He also underwent balloon angioplasty of a left common iliac stenosis and angioplasty of the right common iliac stenosis.  He remains in atrial fibrillation which is treated with Eliquis. He is also on Plavix. He has no symptoms of claudication. He has had no neurologic symptoms.  He reports no new symptoms today.  Dr. Trula Slade last evaluated pt on 03-03-17. At that time: ABI-right equals 1.0, left equals 0.92.... No change from prior study Carotid duplex: Right side shows 1-39 percent.  Known left occlusion Left leg bypass: Velocity is through the stent suggesting 1-49 percent stenosis.  No significant change from 07/01/2016 Abdominal aortic aneurysm: This will need to be imaged in approximately 2020 Lower extremity bypass graft:  Patient will be scheduled for follow-up in 9 months Right leg stenting: Patient was scheduled for follow-up in 9  months Carotid stenosis: No significant change on that day's ultrasound; will not have this imaged in 9 months but rather at his first visit following that.  His blood pressure on 12-17-17 in his PCP's office was 134/82.   He denies any claudication type symptoms with walking, denies any ulcers or wounds.  He had a stroke in 2003, was started on coumadin at that time for this, he denies any subsequent stroke or TIA.  Atrial fib was diagnosed in July 2016, pt states he has no symptoms from this, Dr. Julianne Handler is his cardiologist.   He sees [facility] for proteinuria, had a renal bx.      Diabetic: Yes, A1C was 5.6 on 12-16-17 Tobacco use: former smoker, quit quit in 1993  Pt meds include: Statin :Yes Betablocker: No ASA: No Other anticoagulants/antiplatelets: Plavix for PAOD stent, Eliquis for hx of atrial fib  Past Medical History:  Diagnosis Date  . AAA (abdominal aortic aneurysm) ([place])   . 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 ([place]) 09/07/2008  . Anticardiolipin antibody positive   . Anxiety   . ANXIETY 07/19/2008  . CEREBROVASCULAR ACCIDENT, HX OF 07/19/2008  . CVA (cerebral vascular accident) ([place]) 2003  . Depression   . DEPRESSION 07/19/2008  . Diabetes mellitus    type II  . DIABETES MELLITUS, TYPE II 01/18/2008  . Diverticulosis   . GERD 07/19/2008  . GERD (gastroesophageal reflux disease)   . Hemorrhoids   . Hx of adenomatous colonic polyps   . Hyperlipidemia   . HYPERLIPIDEMIA 07/19/2008  . Hypertension   . HYPERTENSION 07/31/2009  . Increased prostate specific antigen (PSA) velocity 09/13/2010  . Internal  hemorrhoids   . Perineal abscess   . Peripheral vascular disease ([place])    peripheral vascular disease/carotid artery <100%  . PERSONAL HX COLONIC POLYPS 12/10/2007  . Unspecified Peripheral Vascular Disease 01/18/2008  . Varicose veins     Social History Social History   Tobacco Use  . Smoking status: Former Smoker    Types: Cigarettes    Last attempt to  quit: 01/14/1992    Years since quitting: 25.9  . Smokeless tobacco: Never Used  Substance Use Topics  . Alcohol use: Yes    Alcohol/week: 2.0 standard drinks    Types: 2 Glasses of wine per week    Comment: wine daily  . Drug use: No    Family History Family History  Problem Relation Age of Onset  . Aortic aneurysm Mother        desceding aoritic aneurysm  . Cancer Brother        Lung cancer  . Colon cancer Neg Hx   . Esophageal cancer Neg Hx   . Stomach cancer Neg Hx   . Rectal cancer Neg Hx     Past Surgical History:  Procedure Laterality Date  . 7 abdominal hernia repairs  05/31/10  . ABDOMINAL AORTAGRAM N/A 01/12/2013   Procedure: ABDOMINAL Maxcine Ham;  Surgeon: Serafina Mitchell, MD;  Location: [facility] CATH LAB;  Service: Cardiovascular;  Laterality: N/A;  . ABDOMINAL AORTIC ANEURYSM REPAIR    . COLONOSCOPY    . hernia surgury     67 years old  . perineal abscess    . PERIPHERAL VASCULAR CATHETERIZATION N/A 11/23/2014   Procedure: Abdominal Aortogram;  Surgeon: Serafina Mitchell, MD;  Location: [facility] [facility] CV LAB;  Service: Cardiovascular;  Laterality: N/A;  . PERIPHERAL VASCULAR CATHETERIZATION  11/23/2014   Procedure: Lower Extremity Angiography;  Surgeon: Serafina Mitchell, MD;  Location: [facility] CV LAB;  Service: Cardiovascular;;  . PERIPHERAL VASCULAR CATHETERIZATION  11/23/2014   Procedure: Peripheral Vascular Intervention;  Surgeon: Serafina Mitchell, MD;  Location: [facility] CV LAB;  Service: Cardiovascular;;  PTA bilateral common iliac PTA/DCB left fem-pop  . POLYPECTOMY    . 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 SP ENDO REPAIR INFRAREN AAA      Allergies  Allergen Reactions  . Latex Rash    Current Outpatient Medications  Medication Sig Dispense Refill  . amLODipine (NORVASC) 5 MG tablet Take 5 mg by mouth 2 (two) times daily.    Marland Kitchen apixaban (ELIQUIS) 5 MG TABS tablet Take 1 tablet (5 mg total) by mouth 2 (two) times daily. 180 tablet 3  . ascorbic  Acid (VITAMIN C CR) 500 MG CPCR Take 500 mg by mouth daily.     Marland Kitchen atorvastatin (LIPITOR) 40 MG tablet Take 1 tablet (40 mg total) by mouth daily. 90 tablet 3  . cetirizine (ZYRTEC) 10 MG tablet Take 1 tablet (10 mg total) by mouth daily. 90 tablet 3  . clopidogrel (PLAVIX) 75 MG tablet Take 1 tablet (75 mg total) by mouth daily. 90 tablet 3  . docusate sodium (COLACE) 100 MG capsule Take 100 mg by mouth 2 (two) times daily.     . Fiber CAPS Take 5.7 mg by mouth See admin instructions. Take (10) 0.57 capsules by mouth daily for regularity.    . folic acid (FOLVITE) 1 MG tablet Take 1 tablet (1 mg total) by mouth daily. 90 tablet 3  . gemfibrozil (LOPID) 600 MG tablet Take 2 tablets (1,200 mg  total) by mouth daily. 180 tablet 3  . metFORMIN (GLUCOPHAGE-XR) 500 MG 24 hr tablet Take 4 tablets (2,000 mg total) by mouth daily. 4 tabs by mouth in the AM 360 tablet 3  . metroNIDAZOLE (METROGEL) 0.75 % gel Apply 1 application topically daily.     . Multiple Vitamin (MULTIVITAMIN) capsule Take 1 capsule by mouth daily.     Marland Kitchen olmesartan-hydrochlorothiazide (BENICAR HCT) 40-25 MG tablet Take 1 tablet by mouth daily. 90 tablet 3  . Omega-3 Fatty Acids (FISH OIL PO) Take 1 capsule by mouth daily.     . pioglitazone (ACTOS) 30 MG tablet Take 1 tablet (30 mg total) by mouth daily. 90 tablet 3  . triamcinolone (NASACORT) 55 MCG/ACT AERO nasal inhaler Place 2 sprays into the nose daily. (Patient taking differently: Place 1 spray into the nose at bedtime. ) 1 Inhaler 12  . ranitidine (ZANTAC) 150 MG tablet Take 1 tablet (150 mg total) by mouth 2 (two) times daily. 180 tablet 3   No current facility-administered medications for this visit.     ROS: See HPI for pertinent positives and negatives.   Physical Examination  Vitals:   12/30/17 0939 12/30/17 0943  BP: (!) 160/78 (!) 156/73  Pulse: 66   Resp: 20   SpO2: 100%   Weight: 262 lb (118.8 kg)   Height: 5\' 10"  (1.778 m)    Body mass index is 37.59  kg/m.  General: A&O x 3, WDWN, obese male. Gait: normal HENT: No gross abnormalities.  Eyes: PERRLA. Pulmonary: Respirations are non labored, CTAB, good air movement in all fields Cardiac: irregular rhythm with controlled rate, no detected murmur.         Carotid Bruits Right Left   Negative Negative   Radial pulses are 2+ palpable bilaterally   Adominal aortic pulse is not palpable                         VASCULAR EXAM: Extremities without ischemic changes, without Gangrene; without open wounds. Knee high compression hose in place. 1+ pitting edema in both ankles and dorsal aspects of feet.                                                                                                          LE Pulses Right Left       FEMORAL  not palpable (obese)  not palpable        POPLITEAL  not palpable   not palpable       POSTERIOR TIBIAL  not palpable   not palpable        DORSALIS PEDIS      ANTERIOR TIBIAL not palpable  not palpable    Abdomen: soft, NT, no palpable masses. Old well healed midline abdominal scar.  Skin: no rashes, no cellulitis, no ulcers noted. Musculoskeletal: no muscle wasting or atrophy.  Neurologic: A&O X 3; appropriate affect, Sensation is normal; MOTOR FUNCTION:  moving all extremities equally, motor strength 5/5 throughout. Speech is fluent/normal. CN 2-12 intact. Psychiatric: Thought  content is normal, mood appropriate for clinical situation.     ASSESSMENT: David [name] is a 67 y.o. male who is s/p open abdominal aortic aneurysm repair by Dr. Amedeo Plenty in 2012. 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 He had his left popliteal aneurysm repaired by above-knee to below-knee bypass in October of 2010 he's had a midline abdominal hernia repair by Dr. gross. He had elevated velocities in the proximal anastomosis of his bypass graft and underwent arteriogram which revealed a high-grade stenosis extending into his bypass graft balloon  angioplasty was performed however this resulted in a dissection and he therefore underwent stenting using a 6 mm stent. He was found to have in-stent stenosis on the left. On 01/12/2013 he underwent angiography with a successful balloon angioplasty of the stent using a 6 mm balloon. Therefore on 11/23/2014 he underwent drug coated balloon angioplasty of the left proximal anastomosis which has previously been stented. He also underwent balloon angioplasty of a left common iliac stenosis and angioplasty of the right common iliac stenosis.  He denies any claudication type symptoms with walking, denies any ulcers or wounds.  He had a stroke in 2003, was started on coumadin at that time for this, he denies any subsequent stroke or TIA.  Atrial fib was diagnosed in July 2016, pt states he has no symptoms from this, Dr. Julianne Handler is his cardiologist.   He sees [facility] for proteinuria, had a renal bx.      DATA  Bilateral LE Arterial Duplex (12-30-17): Right popliteal stent with diffuse disease throughout the native inflow and outflow, bi and monophasic waveforms.  Left popliteal artery bypass graft with no significant stenosis, all biphasic waveforms.   ABI (Date: 12/30/2017):  R:   ABI: 0.87 (was 1.00 on 01-27-17),   PT: tri  DP: bi  TBI:  0.57, toe pressure 97 (was 0.58)  L:   ABI: 0.96 (was 0.92),   PT: bi  DP: bi  TBI: 0.59, toe pressure 101 (was 0.54) Slight decline in right ABI, mild disease with tri and biphasic waveforms. Slight improvement in left ABI which is in normal range, all bi[hasic waveforms.     PLAN:  Based on the patient's vascular studies and examination, pt will return to clinic in 1 year with ABI's, bilateral LE arterial duplex, abdominal aortic duplex, and carotid duplex.   I discussed in depth with the patient the nature of atherosclerosis, and emphasized the importance of maximal medical management including strict control of blood pressure,  blood glucose, and lipid levels, obtaining regular exercise, and continued cessation of smoking.  The patient is aware that without maximal medical management the underlying atherosclerotic disease process will progress, limiting the benefit of any interventions.  The patient was given information about PAD including signs, symptoms, treatment, what symptoms should prompt the patient to seek immediate medical care, and risk reduction measures to take.  Clemon Chambers, RN, MSN, FNP-C [facility] Phone: [phone]  Clinic MD: Charlesetta Ivory  12/30/17 9:54 AM

## 2018-01-23 ENCOUNTER — Ambulatory Visit (INDEPENDENT_AMBULATORY_CARE_PROVIDER_SITE_OTHER): Payer: Medicare Other | Admitting: Internal Medicine

## 2018-01-23 ENCOUNTER — Encounter: Payer: Self-pay | Admitting: Internal Medicine

## 2018-01-23 VITALS — BP 120/84 | HR 63 | Temp 97.9°F | Ht 70.0 in | Wt 258.0 lb

## 2018-01-23 DIAGNOSIS — E1121 Type 2 diabetes mellitus with diabetic nephropathy: Secondary | ICD-10-CM

## 2018-01-23 DIAGNOSIS — I10 Essential (primary) hypertension: Secondary | ICD-10-CM | POA: Diagnosis not present

## 2018-01-23 DIAGNOSIS — I779 Disorder of arteries and arterioles, unspecified: Secondary | ICD-10-CM | POA: Diagnosis not present

## 2018-01-23 DIAGNOSIS — R05 Cough: Secondary | ICD-10-CM

## 2018-01-23 DIAGNOSIS — R059 Cough, unspecified: Secondary | ICD-10-CM

## 2018-01-23 MED ORDER — PREDNISONE 10 MG PO TABS: ORAL_TABLET | ORAL | 0 refills | Status: DC

## 2018-01-23 MED ORDER — LEVOFLOXACIN 500 MG PO TABS: 500.0000 mg | ORAL_TABLET | Freq: Every day | ORAL | 0 refills | Status: AC

## 2018-01-23 MED ORDER — CEFTRIAXONE SODIUM 1 G IJ SOLR
1.0000 g | Freq: Once | INTRAMUSCULAR | Status: AC
  Administered 2018-01-23: 1 g via INTRAMUSCULAR

## 2018-01-23 MED ORDER — ALBUTEROL SULFATE HFA 108 (90 BASE) MCG/ACT IN AERS: 2.0000 | INHALATION_SPRAY | Freq: Four times a day (QID) | RESPIRATORY_TRACT | 1 refills | Status: DC | PRN

## 2018-01-23 MED ORDER — HYDROCODONE-HOMATROPINE 5-1.5 MG/5ML PO SYRP: 5.0000 mL | ORAL_SOLUTION | Freq: Four times a day (QID) | ORAL | 0 refills | Status: AC | PRN

## 2018-01-23 NOTE — Progress Notes (Signed)
Subjective:    Patient ID: David [name], male    DOB: 10-07-1950, 67 y.o.   MRN: [id number]  HPI  Here with acute onset mild to mod 2-3 days ST, HA, general weakness and malaise, with prod cough greenish sputum, but Pt denies chest pain, increased sob or doe, wheezing, orthopnea, PND, increased LE swelling, palpitations, dizziness or syncope, except for mild onset sob wheeziness last night.   Pt denies new neurological symptoms such as new headache, or facial or extremity weakness or numbness   Pt denies polydipsia, polyuria.   .pxhz Current Outpatient Medications on File Prior to Visit  Medication Sig Dispense Refill  . amLODipine (NORVASC) 5 MG tablet Take 5 mg by mouth 2 (two) times daily.    Marland Kitchen apixaban (ELIQUIS) 5 MG TABS tablet Take 1 tablet (5 mg total) by mouth 2 (two) times daily. 180 tablet 3  . ascorbic Acid (VITAMIN C CR) 500 MG CPCR Take 500 mg by mouth daily.     Marland Kitchen atorvastatin (LIPITOR) 40 MG tablet Take 1 tablet (40 mg total) by mouth daily. 90 tablet 3  . cetirizine (ZYRTEC) 10 MG tablet Take 1 tablet (10 mg total) by mouth daily. 90 tablet 3  . clopidogrel (PLAVIX) 75 MG tablet Take 1 tablet (75 mg total) by mouth daily. 90 tablet 3  . docusate sodium (COLACE) 100 MG capsule Take 100 mg by mouth 2 (two) times daily.     . Fiber CAPS Take 5.7 mg by mouth See admin instructions. Take (10) 0.57 capsules by mouth daily for regularity.    . folic acid (FOLVITE) 1 MG tablet Take 1 tablet (1 mg total) by mouth daily. 90 tablet 3  . gemfibrozil (LOPID) 600 MG tablet Take 2 tablets (1,200 mg total) by mouth daily. 180 tablet 3  . metFORMIN (GLUCOPHAGE-XR) 500 MG 24 hr tablet Take 4 tablets (2,000 mg total) by mouth daily. 4 tabs by mouth in the AM 360 tablet 3  . metroNIDAZOLE (METROGEL) 0.75 % gel Apply 1 application topically daily.     . Multiple Vitamin (MULTIVITAMIN) capsule Take 1 capsule by mouth daily.     Marland Kitchen olmesartan-hydrochlorothiazide (BENICAR HCT) 40-25 MG tablet Take 1  tablet by mouth daily. 90 tablet 3  . Omega-3 Fatty Acids (FISH OIL PO) Take 1 capsule by mouth daily.     . pioglitazone (ACTOS) 30 MG tablet Take 1 tablet (30 mg total) by mouth daily. 90 tablet 3  . triamcinolone (NASACORT) 55 MCG/ACT AERO nasal inhaler Place 2 sprays into the nose daily. (Patient taking differently: Place 1 spray into the nose at bedtime. ) 1 Inhaler 12  . ranitidine (ZANTAC) 150 MG tablet Take 1 tablet (150 mg total) by mouth 2 (two) times daily. 180 tablet 3   No current facility-administered medications on file prior to visit.    Review of Systems  Constitutional: Negative for other unusual diaphoresis or sweats HENT: Negative for ear discharge or swelling Eyes: Negative for other worsening visual disturbances Respiratory: Negative for stridor or other swelling  Gastrointestinal: Negative for worsening distension or other blood Genitourinary: Negative for retention or other urinary change Musculoskeletal: Negative for other MSK pain or swelling Skin: Negative for color change or other new lesions Neurological: Negative for worsening tremors and other numbness  Psychiatric/Behavioral: Negative for worsening agitation or other fatigue All other system neg per pt    Objective:   Physical Exam BP 120/84   Pulse 63   Temp 97.9 F (36.6  C) (Oral)   Ht 5\' 10"  (1.778 m)   Wt 258 lb (117 kg)   SpO2 97%   BMI 37.02 kg/m  VS noted, mild ill Constitutional: Pt appears in NAD HENT: Head: NCAT.  Right Ear: External ear normal.  Left Ear: External ear normal.  Bilat tm's with mild erythema.  Max sinus areas non tender.  Pharynx with mild erythema, no exudate Eyes: . Pupils are equal, round, and reactive to light. Conjunctivae and EOM are normal Nose: without d/c or deformity Neck: Neck supple. Gross normal ROM Cardiovascular: Normal rate and regular rhythm.   Pulmonary/Chest: Effort normal and breath sounds without rales or wheezing.  Neurological: Pt is alert. At  baseline orientation, motor grossly intact Skin: Skin is warm. No rashes, other new lesions, no LE edema Psychiatric: Pt behavior is normal without agitation  No other exam findings Lab Results  Component Value Date   WBC 3.8 (L) 12/16/2017   HGB 12.6 (L) 12/16/2017   HCT 35.9 (L) 12/16/2017   PLT 213.0 12/16/2017   GLUCOSE 130 (H) 12/16/2017   CHOL 113 12/16/2017   TRIG 148.0 12/16/2017   HDL 36.40 (L) 12/16/2017   LDLDIRECT 51.0 03/15/2016   LDLCALC 47 12/16/2017   ALT 15 12/16/2017   AST 19 12/16/2017   NA 134 (L) 12/16/2017   K 4.3 12/16/2017   CL 100 12/16/2017   CREATININE 1.19 12/16/2017   BUN 26 (H) 12/16/2017   CO2 27 12/16/2017   TSH 1.83 12/16/2017   PSA 2.45 03/15/2016   INR 1.17 10/16/2017   HGBA1C 5.6 12/16/2017   MICROALBUR 173.4 (H) 11/12/2016       Assessment & Plan:

## 2018-01-23 NOTE — Patient Instructions (Signed)
You had the antibiotic shot today  Please take all new medication as prescribed - the antibiotic, cough medicine, and prednisone, and inhaler  Please continue all other medications as before, and refills have been done if requested.  Please have the pharmacy call with any other refills you may need.  Please keep your appointments with your specialists as you may have planned

## 2018-01-24 ENCOUNTER — Encounter: Payer: Self-pay | Admitting: Internal Medicine

## 2018-01-24 NOTE — Assessment & Plan Note (Signed)
stable overall by history and exam, recent data reviewed with pt, and pt to continue medical treatment as before,  to f/u any worsening symptoms or concerns  

## 2018-01-24 NOTE — Assessment & Plan Note (Addendum)
Mild to mod, c/w bronchitis vs pna, declines cxr, for antibx course, cough med prn, predpac asd,  Inhaler prn,  to f/u any worsening symptoms or concerns

## 2018-02-18 ENCOUNTER — Encounter: Payer: Self-pay | Admitting: Internal Medicine

## 2018-03-03 ENCOUNTER — Ambulatory Visit (INDEPENDENT_AMBULATORY_CARE_PROVIDER_SITE_OTHER): Payer: Medicare Other

## 2018-03-03 DIAGNOSIS — Z23 Encounter for immunization: Secondary | ICD-10-CM | POA: Diagnosis not present

## 2018-03-08 ENCOUNTER — Other Ambulatory Visit: Payer: Self-pay | Admitting: Internal Medicine

## 2018-03-27 ENCOUNTER — Encounter: Payer: Self-pay | Admitting: Internal Medicine

## 2018-03-31 ENCOUNTER — Encounter: Payer: Self-pay | Admitting: Internal Medicine

## 2018-03-31 NOTE — Telephone Encounter (Signed)
Shirron to contact pt to see if he would want an OV - thanks

## 2018-04-01 ENCOUNTER — Ambulatory Visit (INDEPENDENT_AMBULATORY_CARE_PROVIDER_SITE_OTHER)
Admission: RE | Admit: 2018-04-01 | Discharge: 2018-04-01 | Disposition: A | Discharge status: Home / Self Care | Payer: Medicare Other | Source: Ambulatory Visit | Attending: Internal Medicine | Admitting: Internal Medicine

## 2018-04-01 ENCOUNTER — Ambulatory Visit (INDEPENDENT_AMBULATORY_CARE_PROVIDER_SITE_OTHER): Payer: Medicare Other | Admitting: Internal Medicine

## 2018-04-01 ENCOUNTER — Encounter: Payer: Self-pay | Admitting: Internal Medicine

## 2018-04-01 VITALS — BP 122/72 | HR 91 | Temp 98.1°F | Ht 70.0 in | Wt 280.0 lb

## 2018-04-01 DIAGNOSIS — R059 Cough, unspecified: Secondary | ICD-10-CM

## 2018-04-01 DIAGNOSIS — R062 Wheezing: Secondary | ICD-10-CM

## 2018-04-01 DIAGNOSIS — E1121 Type 2 diabetes mellitus with diabetic nephropathy: Secondary | ICD-10-CM

## 2018-04-01 DIAGNOSIS — R05 Cough: Secondary | ICD-10-CM | POA: Diagnosis not present

## 2018-04-01 DIAGNOSIS — I10 Essential (primary) hypertension: Secondary | ICD-10-CM | POA: Diagnosis not present

## 2018-04-01 DIAGNOSIS — I779 Disorder of arteries and arterioles, unspecified: Secondary | ICD-10-CM | POA: Diagnosis not present

## 2018-04-01 MED ORDER — HYDROCOD POLST-CPM POLST ER 10-8 MG/5ML PO SUER: 5.0000 mL | Freq: Two times a day (BID) | ORAL | 0 refills | Status: DC | PRN

## 2018-04-01 MED ORDER — CEFTRIAXONE SODIUM 1 G IJ SOLR
1.0000 g | Freq: Once | INTRAMUSCULAR | Status: AC
  Administered 2018-04-01: 1 g via INTRAMUSCULAR

## 2018-04-01 MED ORDER — PREDNISONE 10 MG PO TABS: ORAL_TABLET | ORAL | 0 refills | Status: DC

## 2018-04-01 MED ORDER — LEVOFLOXACIN 500 MG PO TABS: 500.0000 mg | ORAL_TABLET | Freq: Every day | ORAL | 0 refills | Status: AC

## 2018-04-01 NOTE — Progress Notes (Signed)
Subjective:    Patient ID: David [name], male    DOB: June 04, 1950, 67 y.o.   MRN: [id number]  HPI  Here with acute onset mild to mod 5 days ST, HA, general weakness and malaise, with prod cough greenish sputum, but Pt denies chest pain, increased sob or doe, wheezing, orthopnea, PND, increased LE swelling, palpitations, dizziness or syncope, except for onset mild wheezing and sob since last PM   Pt denies polydipsia, polyuria.  Pt denies new neurological symptoms such as new headache, or facial or extremity weakness or numbness Past Medical History:  Diagnosis Date  . AAA (abdominal aortic aneurysm) ([place])   . 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 Anticardiolipin antibody positive   . Anxiety   . ANXIETY 07/19/2008  . CEREBROVASCULAR ACCIDENT, HX OF 07/19/2008  . CVA (cerebral vascular accident) ([place]) 2003  . Depression   . DEPRESSION 07/19/2008  . Diabetes mellitus    type II  . DIABETES MELLITUS, TYPE II 01/18/2008  . Diverticulosis   . GERD 07/19/2008  . GERD (gastroesophageal reflux disease)   . Hemorrhoids   . Hx of adenomatous colonic polyps   . Hyperlipidemia   . HYPERLIPIDEMIA 07/19/2008  . Hypertension   . HYPERTENSION 07/31/2009  . Increased prostate specific antigen (PSA) velocity 09/13/2010  . Internal hemorrhoids   . Perineal abscess   . Peripheral vascular disease ([place])    peripheral vascular disease/carotid artery <100%  . PERSONAL HX COLONIC POLYPS 12/10/2007  . Unspecified Peripheral Vascular Disease 01/18/2008  . Varicose veins    Past Surgical History:  Procedure Laterality Date  . 7 abdominal hernia repairs  05/31/10  . ABDOMINAL AORTAGRAM N/A 01/12/2013   Procedure: ABDOMINAL Maxcine Ham;  Surgeon: Serafina Mitchell, MD;  Location: [facility] CATH LAB;  Service: Cardiovascular;  Laterality: N/A;  . ABDOMINAL AORTIC ANEURYSM REPAIR    . COLONOSCOPY    . hernia surgury     67 years old  . perineal abscess    . PERIPHERAL VASCULAR CATHETERIZATION N/A  11/23/2014   Procedure: Abdominal Aortogram;  Surgeon: Serafina Mitchell, MD;  Location: [place] CV LAB;  Service: Cardiovascular;  Laterality: N/A;  . PERIPHERAL VASCULAR CATHETERIZATION  11/23/2014   Procedure: Lower Extremity Angiography;  Surgeon: Serafina Mitchell, MD;  Location: [place] CV LAB;  Service: Cardiovascular;;  . PERIPHERAL VASCULAR CATHETERIZATION  11/23/2014   Procedure: Peripheral Vascular Intervention;  Surgeon: Serafina Mitchell, MD;  Location: [facility] CV LAB;  Service: Cardiovascular;;  PTA bilateral common iliac PTA/DCB left fem-pop  . POLYPECTOMY    . 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 SP ENDO REPAIR INFRAREN AAA      reports that he quit smoking about 26 years ago. His smoking use included cigarettes. He has never used smokeless tobacco. He reports that he drinks about 2.0 standard drinks of alcohol per week. He reports that he does not use drugs. family history includes Aortic aneurysm in his mother; Cancer in his brother. Allergies  Allergen Reactions  . Latex Rash   Current Outpatient Medications on File Prior to Visit  Medication Sig Dispense Refill  . albuterol (PROVENTIL HFA;VENTOLIN HFA) 108 (90 Base) MCG/ACT inhaler Inhale 2 puffs into the lungs every 6 (six) hours as needed for wheezing or shortness of breath. 1 Inhaler 1  . amLODipine (NORVASC) 5 MG tablet TAKE (2) TABLETS BY MOUTH DAILY. 60 tablet 0  . apixaban (ELIQUIS) 5 MG TABS tablet Take  1 tablet (5 mg total) by mouth 2 (two) times daily. 180 tablet 3  . ascorbic Acid (VITAMIN C CR) 500 MG CPCR Take 500 mg by mouth daily.     Marland Kitchen atorvastatin (LIPITOR) 40 MG tablet Take 1 tablet (40 mg total) by mouth daily. 90 tablet 3  . cetirizine (ZYRTEC) 10 MG tablet Take 1 tablet (10 mg total) by mouth daily. 90 tablet 3  . clopidogrel (PLAVIX) 75 MG tablet Take 1 tablet (75 mg total) by mouth daily. 90 tablet 3  . docusate sodium (COLACE) 100 MG capsule Take 100 mg by mouth 2 (two) times  daily.     . Fiber CAPS Take 5.7 mg by mouth See admin instructions. Take (10) 0.57 capsules by mouth daily for regularity.    . folic acid (FOLVITE) 1 MG tablet Take 1 tablet (1 mg total) by mouth daily. 90 tablet 3  . gemfibrozil (LOPID) 600 MG tablet Take 2 tablets (1,200 mg total) by mouth daily. 180 tablet 3  . metFORMIN (GLUCOPHAGE-XR) 500 MG 24 hr tablet Take 4 tablets (2,000 mg total) by mouth daily. 4 tabs by mouth in the AM 360 tablet 3  . metroNIDAZOLE (METROGEL) 0.75 % gel Apply 1 application topically daily.     . Multiple Vitamin (MULTIVITAMIN) capsule Take 1 capsule by mouth daily.     Marland Kitchen olmesartan-hydrochlorothiazide (BENICAR HCT) 40-25 MG tablet Take 1 tablet by mouth daily. 90 tablet 3  . Omega-3 Fatty Acids (FISH OIL PO) Take 1 capsule by mouth daily.     . pioglitazone (ACTOS) 30 MG tablet Take 1 tablet (30 mg total) by mouth daily. 90 tablet 3  . triamcinolone (NASACORT) 55 MCG/ACT AERO nasal inhaler Place 2 sprays into the nose daily. (Patient taking differently: Place 1 spray into the nose at bedtime. ) 1 Inhaler 12  . ranitidine (ZANTAC) 150 MG tablet Take 1 tablet (150 mg total) by mouth 2 (two) times daily. 180 tablet 3   No current facility-administered medications on file prior to visit.    Review of Systems  Constitutional: Negative for other unusual diaphoresis or sweats HENT: Negative for ear discharge or swelling Eyes: Negative for other worsening visual disturbances Respiratory: Negative for stridor or other swelling  Gastrointestinal: Negative for worsening distension or other blood Genitourinary: Negative for retention or other urinary change Musculoskeletal: Negative for other MSK pain or swelling Skin: Negative for color change or other new lesions Neurological: Negative for worsening tremors and other numbness  Psychiatric/Behavioral: Negative for worsening agitation or other fatigue All other system neg per pt    Objective:   Physical Exam BP  122/72   Pulse 91   Temp 98.1 F (36.7 C) (Oral)   Ht 5\' 10"  (1.778 m)   Wt 280 lb (127 kg)   SpO2 94%   BMI 40.18 kg/m  VS noted, mild ill Constitutional: Pt appears in NAD HENT: Head: NCAT.  Right Ear: External ear normal.  Left Ear: External ear normal.  Eyes: . Pupils are equal, round, and reactive to light. Conjunctivae and EOM are normal Nose: without d/c or deformity Neck: Neck supple. Gross normal ROM Cardiovascular: Normal rate and regular rhythm.   Pulmonary/Chest: Effort normal and breath sounds decreased without rales but with mild bilat wheezing.  Neurological: Pt is alert. At baseline orientation, motor grossly intact Skin: Skin is warm. No rashes, other new lesions, no LE edema Psychiatric: Pt behavior is normal without agitation  No other exam findings  Lab Results  Component Value Date   WBC 3.8 (L) 12/16/2017   HGB 12.6 (L) 12/16/2017   HCT 35.9 (L) 12/16/2017   PLT 213.0 12/16/2017   GLUCOSE 130 (H) 12/16/2017   CHOL 113 12/16/2017   TRIG 148.0 12/16/2017   HDL 36.40 (L) 12/16/2017   LDLDIRECT 51.0 03/15/2016   LDLCALC 47 12/16/2017   ALT 15 12/16/2017   AST 19 12/16/2017   NA 134 (L) 12/16/2017   K 4.3 12/16/2017   CL 100 12/16/2017   CREATININE 1.19 12/16/2017   BUN 26 (H) 12/16/2017   CO2 27 12/16/2017   TSH 1.83 12/16/2017   PSA 2.45 03/15/2016   INR 1.17 10/16/2017   HGBA1C 5.6 12/16/2017   MICROALBUR 173.4 (H) 11/12/2016       Assessment & Plan:

## 2018-04-01 NOTE — Patient Instructions (Signed)
You had the antibiotic shot today (rocephin)  Please take all new medication as prescribed- the antibiotic, prednisone, and cough medicine as needed  Please continue all other medications as before, including the inhaler again  Please have the pharmacy call with any other refills you may need.  Please keep your appointments with your specialists as you may have planned  Please go to the XRAY Department in the Basement (go straight as you get off the elevator) for the x-ray testing  You will be contacted by phone if any changes need to be made immediately.  Otherwise, you will receive a letter about your results with an explanation, but please check with MyChart first.  Please remember to sign up for MyChart if you have not done so, as this will be important to you in the future with finding out test results, communicating by private email, and scheduling acute appointments online when needed.

## 2018-04-02 ENCOUNTER — Encounter: Payer: Self-pay | Admitting: Internal Medicine

## 2018-04-02 NOTE — Assessment & Plan Note (Addendum)
Mild to mod, for predpac asd,  Cont home inhaler, to f/u any worsening symptoms or concerns

## 2018-04-02 NOTE — Assessment & Plan Note (Signed)
Mild to mod, c/w bronchitis vs pna, for cxr,  for rocephin 1 gm IM x 1, and oral antibx course, to f/u any worsening symptoms or concerns

## 2018-04-02 NOTE — Assessment & Plan Note (Signed)
stable overall by history and exam, recent data reviewed with pt, and pt to continue medical treatment as before,  to f/u any worsening symptoms or concerns  

## 2018-04-06 ENCOUNTER — Encounter

## 2018-04-14 DIAGNOSIS — R609 Edema, unspecified: Secondary | ICD-10-CM | POA: Diagnosis not present

## 2018-04-14 DIAGNOSIS — I129 Hypertensive chronic kidney disease with stage 1 through stage 4 chronic kidney disease, or unspecified chronic kidney disease: Secondary | ICD-10-CM | POA: Diagnosis not present

## 2018-04-14 DIAGNOSIS — R809 Proteinuria, unspecified: Secondary | ICD-10-CM | POA: Diagnosis not present

## 2018-04-14 DIAGNOSIS — E669 Obesity, unspecified: Secondary | ICD-10-CM | POA: Diagnosis not present

## 2018-06-05 DIAGNOSIS — I129 Hypertensive chronic kidney disease with stage 1 through stage 4 chronic kidney disease, or unspecified chronic kidney disease: Secondary | ICD-10-CM | POA: Diagnosis not present

## 2018-06-19 ENCOUNTER — Telehealth: Payer: Self-pay | Admitting: *Deleted

## 2018-06-19 NOTE — Telephone Encounter (Signed)
LVM for patient to call back in regards to scheduling AWV with our health coach.  

## 2018-06-22 ENCOUNTER — Other Ambulatory Visit (INDEPENDENT_AMBULATORY_CARE_PROVIDER_SITE_OTHER): Payer: Medicare Other

## 2018-06-22 ENCOUNTER — Telehealth: Payer: Self-pay | Admitting: Emergency Medicine

## 2018-06-22 DIAGNOSIS — I10 Essential (primary) hypertension: Secondary | ICD-10-CM | POA: Diagnosis not present

## 2018-06-22 DIAGNOSIS — E1121 Type 2 diabetes mellitus with diabetic nephropathy: Secondary | ICD-10-CM

## 2018-06-22 DIAGNOSIS — Z0000 Encounter for general adult medical examination without abnormal findings: Secondary | ICD-10-CM

## 2018-06-22 LAB — CBC WITH DIFFERENTIAL/PLATELET
BASOS ABS: 0.1 10*3/uL (ref 0.0–0.1)
Basophils Relative: 1.3 % (ref 0.0–3.0)
Eosinophils Absolute: 0.5 10*3/uL (ref 0.0–0.7)
Eosinophils Relative: 7.9 % — ABNORMAL HIGH (ref 0.0–5.0)
HCT: 33.2 % — ABNORMAL LOW (ref 39.0–52.0)
Hemoglobin: 11.2 g/dL — ABNORMAL LOW (ref 13.0–17.0)
Lymphocytes Relative: 12.5 % (ref 12.0–46.0)
Lymphs Abs: 0.8 10*3/uL (ref 0.7–4.0)
MCHC: 33.9 g/dL (ref 30.0–36.0)
MCV: 95.2 fl (ref 78.0–100.0)
Monocytes Absolute: 0.6 10*3/uL (ref 0.1–1.0)
Monocytes Relative: 9 % (ref 3.0–12.0)
NEUTROS PCT: 69.3 % (ref 43.0–77.0)
Neutro Abs: 4.4 10*3/uL (ref 1.4–7.7)
Platelets: 233 10*3/uL (ref 150.0–400.0)
RBC: 3.49 Mil/uL — ABNORMAL LOW (ref 4.22–5.81)
RDW: 13.5 % (ref 11.5–15.5)
WBC: 6.3 10*3/uL (ref 4.0–10.5)

## 2018-06-22 LAB — LIPID PANEL
Cholesterol: 124 mg/dL (ref 0–200)
HDL: 34.4 mg/dL — ABNORMAL LOW (ref >39.00)
LDL Cholesterol: 53 mg/dL (ref 0–99)
NonHDL: 89.22
Total CHOL/HDL Ratio: 4
Triglycerides: 182 mg/dL — ABNORMAL HIGH (ref 0.0–149.0)
VLDL: 36.4 mg/dL (ref 0.0–40.0)

## 2018-06-22 LAB — HEPATIC FUNCTION PANEL
ALT: 10 U/L (ref 0–53)
AST: 15 U/L (ref 0–37)
Albumin: 4 g/dL (ref 3.5–5.2)
Alkaline Phosphatase: 48 U/L (ref 39–117)
Bilirubin, Direct: 0.1 mg/dL (ref 0.0–0.3)
Total Bilirubin: 0.4 mg/dL (ref 0.2–1.2)
Total Protein: 7.7 g/dL (ref 6.0–8.3)

## 2018-06-22 LAB — HEMOGLOBIN A1C: Hgb A1c MFr Bld: 5.7 % (ref 4.6–6.5)

## 2018-06-22 LAB — BASIC METABOLIC PANEL
BUN: 44 mg/dL — ABNORMAL HIGH (ref 6–23)
CO2: 25 mEq/L (ref 19–32)
Calcium: 9.7 mg/dL (ref 8.4–10.5)
Chloride: 102 mEq/L (ref 96–112)
Creatinine, Ser: 1.17 mg/dL (ref 0.40–1.50)
GFR: 61.68 mL/min (ref >60.00)
Glucose, Bld: 130 mg/dL — ABNORMAL HIGH (ref 70–99)
Potassium: 3.9 mEq/L (ref 3.5–5.1)
Sodium: 137 mEq/L (ref 135–145)

## 2018-06-22 LAB — TSH: TSH: 1.89 u[IU]/mL (ref 0.35–4.50)

## 2018-06-22 NOTE — Telephone Encounter (Signed)
Pt came in for labs before CPE. Labs ordered.

## 2018-06-22 NOTE — Addendum Note (Signed)
Addended by: Delice Bison E on: 06/22/2018 08:30 AM   Modules accepted: Orders

## 2018-06-23 ENCOUNTER — Ambulatory Visit (INDEPENDENT_AMBULATORY_CARE_PROVIDER_SITE_OTHER): Payer: Medicare Other | Admitting: Internal Medicine

## 2018-06-23 ENCOUNTER — Encounter: Payer: Self-pay | Admitting: Internal Medicine

## 2018-06-23 VITALS — BP 128/74 | Temp 97.6°F | Ht 70.0 in | Wt 265.0 lb

## 2018-06-23 DIAGNOSIS — E1121 Type 2 diabetes mellitus with diabetic nephropathy: Secondary | ICD-10-CM

## 2018-06-23 DIAGNOSIS — I10 Essential (primary) hypertension: Secondary | ICD-10-CM

## 2018-06-23 DIAGNOSIS — E785 Hyperlipidemia, unspecified: Secondary | ICD-10-CM | POA: Diagnosis not present

## 2018-06-23 NOTE — Assessment & Plan Note (Signed)
stable overall by history and exam, recent data reviewed with pt, and pt to continue medical treatment as before,  to f/u any worsening symptoms or concerns  

## 2018-06-23 NOTE — Progress Notes (Signed)
Subjective:    Patient ID: David [name], male    DOB: [birth year], 68 y.o.   MRN: [id number]  HPI   Here to f/u; overall doing ok,  Pt denies chest pain, increasing sob or doe, wheezing, orthopnea, PND, increased LE swelling, palpitations, dizziness or syncope.  Pt denies new neurological symptoms such as new headache, or facial or extremity weakness or numbness.  Pt denies polydipsia, polyuria, or low sugar episode.  Pt states overall good compliance with meds, mostly trying to follow appropriate diet, with wt overall stable,  but little exercise however.  No new complaints.  S/p renal bx 2019 and followed for improving proteiunuria per hematology and urology Past Medical History:  Diagnosis Date  . AAA (abdominal aortic aneurysm) ([place])   . 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 Anticardiolipin antibody positive   . Anxiety   . ANXIETY 07/19/2008  . CEREBROVASCULAR ACCIDENT, HX OF 07/19/2008  . CVA (cerebral vascular accident) ([place]) 2003  . Depression   . DEPRESSION 07/19/2008  . Diabetes mellitus    type II  . DIABETES MELLITUS, TYPE II 01/18/2008  . Diverticulosis   . GERD 07/19/2008  . GERD (gastroesophageal reflux disease)   . Hemorrhoids   . Hx of adenomatous colonic polyps   . Hyperlipidemia   . HYPERLIPIDEMIA 07/19/2008  . Hypertension   . HYPERTENSION 07/31/2009  . Increased prostate specific antigen (PSA) velocity 09/13/2010  . Internal hemorrhoids   . Perineal abscess   . Peripheral vascular disease ([place])    peripheral vascular disease/carotid artery <100%  . PERSONAL HX COLONIC POLYPS 12/10/2007  . Unspecified Peripheral Vascular Disease 01/18/2008  . Varicose veins    Past Surgical History:  Procedure Laterality Date  . 7 abdominal hernia repairs  05/31/10  . ABDOMINAL AORTAGRAM N/A 01/12/2013   Procedure: ABDOMINAL Maxcine Ham;  Surgeon: Serafina Mitchell, MD;  Location: [facility] CATH LAB;  Service: Cardiovascular;  Laterality: N/A;  . ABDOMINAL AORTIC ANEURYSM  REPAIR    . COLONOSCOPY    . hernia surgury     68 years old  . perineal abscess    . PERIPHERAL VASCULAR CATHETERIZATION N/A 11/23/2014   Procedure: Abdominal Aortogram;  Surgeon: Serafina Mitchell, MD;  Location: [place] CV LAB;  Service: Cardiovascular;  Laterality: N/A;  . PERIPHERAL VASCULAR CATHETERIZATION  11/23/2014   Procedure: Lower Extremity Angiography;  Surgeon: Serafina Mitchell, MD;  Location: [place] CV LAB;  Service: Cardiovascular;;  . PERIPHERAL VASCULAR CATHETERIZATION  11/23/2014   Procedure: Peripheral Vascular Intervention;  Surgeon: Serafina Mitchell, MD;  Location: [place] CV LAB;  Service: Cardiovascular;;  PTA bilateral common iliac PTA/DCB left fem-pop  . POLYPECTOMY    . 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 SP ENDO REPAIR INFRAREN AAA      reports that he quit smoking about 26 years ago. His smoking use included cigarettes. He has never used smokeless tobacco. He reports current alcohol use of about 2.0 standard drinks of alcohol per week. He reports that he does not use drugs. family history includes Aortic aneurysm in his mother; Cancer in his brother. Allergies  Allergen Reactions  . Latex Rash   Current Outpatient Medications on File Prior to Visit  Medication Sig Dispense Refill  . albuterol (PROVENTIL HFA;VENTOLIN HFA) 108 (90 Base) MCG/ACT inhaler Inhale 2 puffs into the lungs every 6 (six) hours as needed for wheezing or shortness of breath. 1 Inhaler 1  . amLODipine (  NORVASC) 5 MG tablet TAKE (2) TABLETS BY MOUTH DAILY. 60 tablet 0  . apixaban (ELIQUIS) 5 MG TABS tablet Take 1 tablet (5 mg total) by mouth 2 (two) times daily. 180 tablet 3  . ascorbic Acid (VITAMIN C CR) 500 MG CPCR Take 500 mg by mouth daily.     Marland Kitchen atorvastatin (LIPITOR) 40 MG tablet Take 1 tablet (40 mg total) by mouth daily. 90 tablet 3  . cetirizine (ZYRTEC) 10 MG tablet Take 1 tablet (10 mg total) by mouth daily. 90 tablet 3  . chlorpheniramine-HYDROcodone  (TUSSIONEX PENNKINETIC ER) 10-8 MG/5ML SUER Take 5 mLs by mouth every 12 (twelve) hours as needed for cough. 140 mL 0  . clopidogrel (PLAVIX) 75 MG tablet Take 1 tablet (75 mg total) by mouth daily. 90 tablet 3  . docusate sodium (COLACE) 100 MG capsule Take 100 mg by mouth 2 (two) times daily.     . famotidine (PEPCID) 20 MG tablet Take 20 mg by mouth 2 (two) times daily.    . Fiber CAPS Take 5.7 mg by mouth See admin instructions. Take (10) 0.57 capsules by mouth daily for regularity.    . folic acid (FOLVITE) 1 MG tablet Take 1 tablet (1 mg total) by mouth daily. 90 tablet 3  . furosemide (LASIX) 40 MG tablet Take 40 mg by mouth daily.    Marland Kitchen gemfibrozil (LOPID) 600 MG tablet Take 2 tablets (1,200 mg total) by mouth daily. 180 tablet 3  . metFORMIN (GLUCOPHAGE-XR) 500 MG 24 hr tablet Take 4 tablets (2,000 mg total) by mouth daily. 4 tabs by mouth in the AM 360 tablet 3  . metroNIDAZOLE (METROGEL) 0.75 % gel Apply 1 application topically daily.     . Multiple Vitamin (MULTIVITAMIN) capsule Take 1 capsule by mouth daily.     Marland Kitchen olmesartan-hydrochlorothiazide (BENICAR HCT) 40-25 MG tablet Take 1 tablet by mouth daily. 90 tablet 3  . pioglitazone (ACTOS) 30 MG tablet Take 1 tablet (30 mg total) by mouth daily. 90 tablet 3  . triamcinolone (NASACORT) 55 MCG/ACT AERO nasal inhaler Place 2 sprays into the nose daily. (Patient taking differently: Place 1 spray into the nose at bedtime. ) 1 Inhaler 12   No current facility-administered medications on file prior to visit.    Review of Systems  Constitutional: Negative for other unusual diaphoresis or sweats HENT: Negative for ear discharge or swelling Eyes: Negative for other worsening visual disturbances Respiratory: Negative for stridor or other swelling  Gastrointestinal: Negative for worsening distension or other blood Genitourinary: Negative for retention or other urinary change Musculoskeletal: Negative for other MSK pain or swelling Skin:  Negative for color change or other new lesions Neurological: Negative for worsening tremors and other numbness  Psychiatric/Behavioral: Negative for worsening agitation or other fatigue All other system neg per pt    Objective:   Physical Exam BP 128/74   Temp 97.6 F (36.4 C) (Oral)   Ht 5\' 10"  (1.778 m)   Wt 265 lb (120.2 kg)   BMI 38.02 kg/m  VS noted,  Constitutional: Pt appears in NAD HENT: Head: NCAT.  Right Ear: External ear normal.  Left Ear: External ear normal.  Eyes: . Pupils are equal, round, and reactive to light. Conjunctivae and EOM are normal Nose: without d/c or deformity Neck: Neck supple. Gross normal ROM Cardiovascular: Normal rate and regular rhythm.   Pulmonary/Chest: Effort normal and breath sounds without rales or wheezing.  Neurological: Pt is alert. At baseline orientation, motor grossly intact  except for left side weakness s/p cva as before Skin: Skin is warm. No rashes, other new lesions, no LE edema Psychiatric: Pt behavior is normal without agitation  No other exam findings Lab Results  Component Value Date   WBC 6.3 06/22/2018   HGB 11.2 (L) 06/22/2018   HCT 33.2 (L) 06/22/2018   PLT 233.0 06/22/2018   GLUCOSE 130 (H) 06/22/2018   CHOL 124 06/22/2018   TRIG 182.0 (H) 06/22/2018   HDL 34.40 (L) 06/22/2018   LDLDIRECT 51.0 03/15/2016   LDLCALC 53 06/22/2018   ALT 10 06/22/2018   AST 15 06/22/2018   NA 137 06/22/2018   K 3.9 06/22/2018   CL 102 06/22/2018   CREATININE 1.17 06/22/2018   BUN 44 (H) 06/22/2018   CO2 25 06/22/2018   TSH 1.89 06/22/2018   PSA 2.45 03/15/2016   INR 1.17 10/16/2017   HGBA1C 5.7 06/22/2018   MICROALBUR 173.4 (H) 11/12/2016        Assessment & Plan:

## 2018-06-23 NOTE — Patient Instructions (Signed)
Please continue all other medications as before, and refills have been done if requested.  Please have the pharmacy call with any other refills you may need.  Please continue your efforts at being more active, low cholesterol diet, and weight control.  You are otherwise up to date with prevention measures today.  Please keep your appointments with your specialists as you may have planned  Please return in 6 months, or sooner if needed, with Lab testing done 3-5 days before  

## 2018-06-28 ENCOUNTER — Other Ambulatory Visit: Payer: Self-pay | Admitting: Internal Medicine

## 2018-09-20 ENCOUNTER — Other Ambulatory Visit: Payer: Self-pay | Admitting: Internal Medicine

## 2018-10-04 ENCOUNTER — Other Ambulatory Visit: Payer: Self-pay | Admitting: Internal Medicine

## 2018-11-08 ENCOUNTER — Other Ambulatory Visit: Payer: Self-pay | Admitting: Cardiovascular Disease

## 2018-11-08 DIAGNOSIS — I4819 Other persistent atrial fibrillation: Secondary | ICD-10-CM

## 2018-11-09 NOTE — Telephone Encounter (Addendum)
Eliquis 5mg  refill request received; pt is 68 yrs old, wt-120.2kg, Crea- 1.17 on 06/22/2018, last seen by Dr. Angelena Form on 11/17/2017-needs an appt per OV note and recall-appt set after calling pt.   Called pt to make aware that he needs to get an appt scheduled with Dr. Angelena Form and he states he tried to get one but they stated he couldn't-advised that I can transfer him to the main number to get scheduled and he is aware that he needs to get an appt and once I see the appt has been made, I will refill. Will monitor to ensure that  an appt for the pt has been made.  Pt made an appt and refill sent as requested.

## 2018-11-25 ENCOUNTER — Telehealth: Payer: Self-pay

## 2018-11-25 NOTE — Telephone Encounter (Signed)
Routing to Dr. Angelena Form for verification of appointment type.       COVID-19 Pre-Screening Questions:  . In the past 7 to 10 days have you had a cough,  shortness of breath, headache, congestion, fever (100 or greater) body aches, chills, sore throat, or sudden loss of taste or sense of smell? . Have you been around anyone with known Covid 19. . Have you been around anyone who is awaiting Covid 19 test results in the past 7 to 10 days? . Have you been around anyone who has been exposed to Covid 19, or has mentioned symptoms of Covid 19 within the past 7 to 10 days?  If you have any concerns/questions about symptoms patients report during screening (either on the phone or at threshold). Contact the provider seeing the patient or DOD for further guidance.  If neither are available contact a member of the leadership team.       I called and spoke with patient, he answered no to all questions except the 3rd one. His father who is 23 is at [facility] in isolation. The fathers caregivers husband had Covid. The caregiver tested negative for Covid, but his father is still awaiting test results. He went to see his father today and stated that he spent an hour and 15 minutes with him, but he had to wear an N95 mask and rubber suit with gloves to visit his father. He is ok with a virtual visit tomorrow if you do not think he needs to come into the office. Please advise so I can call patient back.

## 2018-11-26 ENCOUNTER — Encounter: Payer: Self-pay | Admitting: Cardiovascular Disease

## 2018-11-26 ENCOUNTER — Other Ambulatory Visit: Payer: Self-pay

## 2018-11-26 ENCOUNTER — Ambulatory Visit (INDEPENDENT_AMBULATORY_CARE_PROVIDER_SITE_OTHER): Payer: Medicare Other | Admitting: Cardiovascular Disease

## 2018-11-26 VITALS — BP 144/78 | HR 73 | Ht 70.0 in | Wt 270.0 lb

## 2018-11-26 DIAGNOSIS — E7800 Pure hypercholesterolemia, unspecified: Secondary | ICD-10-CM | POA: Diagnosis not present

## 2018-11-26 DIAGNOSIS — I10 Essential (primary) hypertension: Secondary | ICD-10-CM

## 2018-11-26 DIAGNOSIS — I340 Nonrheumatic mitral (valve) insufficiency: Secondary | ICD-10-CM

## 2018-11-26 DIAGNOSIS — I4819 Other persistent atrial fibrillation: Secondary | ICD-10-CM

## 2018-11-26 NOTE — Progress Notes (Signed)
Chief Complaint  Patient presents with  . Follow-up    atrial fibrillation    History of Present Illness: 68 yo David [name] with history of HTN, HLD, DM, persistent atrial fibrillation, PAD, AAA status post open repair 2012, remote CVA, GERD here today for cardiac follow up. He was found to have atrial fibrillation in 2016 while in the hospital for a PV procedure. He has been on Eliquis and Plavix. He has not been on AV nodal blocking agents. His PAD is followed by Dr. Trula Slade.  Nuclear stress test 2010 with no ischemia. Echo July 2016 with normal LV systolic function, MPNT=61-44%. His wife is also my patient.   He is here today for follow up. The patient denies any chest pain, dyspnea, palpitations, lower extremity edema, orthopnea, PND, dizziness, near syncope or syncope.   Primary Care Physician: Biagio Borg, MD   Past Medical History:  Diagnosis Date  . AAA (abdominal aortic aneurysm) ([place])   . 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 ([place]) 09/07/2008  . Anticardiolipin antibody positive   . Anxiety   . ANXIETY 07/19/2008  . CEREBROVASCULAR ACCIDENT, HX OF 07/19/2008  . CVA (cerebral vascular accident) ([place]) 2003  . Depression   . DEPRESSION 07/19/2008  . Diabetes mellitus    type II  . DIABETES MELLITUS, TYPE II 01/18/2008  . Diverticulosis   . GERD 07/19/2008  . GERD (gastroesophageal reflux disease)   . Hemorrhoids   . Hx of adenomatous colonic polyps   . Hyperlipidemia   . HYPERLIPIDEMIA 07/19/2008  . Hypertension   . HYPERTENSION 07/31/2009  . Increased prostate specific antigen (PSA) velocity 09/13/2010  . Internal hemorrhoids   . Perineal abscess   . Peripheral vascular disease ([place])    peripheral vascular disease/carotid artery <100%  . PERSONAL HX COLONIC POLYPS 12/10/2007  . Unspecified Peripheral Vascular Disease 01/18/2008  . Varicose veins     Past Surgical History:  Procedure Laterality Date  . 7 abdominal hernia repairs  05/31/10  . ABDOMINAL AORTAGRAM N/A 01/12/2013    Procedure: ABDOMINAL Maxcine Ham;  Surgeon: Serafina Mitchell, MD;  Location: [facility] CATH LAB;  Service: Cardiovascular;  Laterality: N/A;  . ABDOMINAL AORTIC ANEURYSM REPAIR    . COLONOSCOPY    . hernia surgury     68 years old  . perineal abscess    . PERIPHERAL VASCULAR CATHETERIZATION N/A 11/23/2014   Procedure: Abdominal Aortogram;  Surgeon: Serafina Mitchell, MD;  Location: [facility] LAB;  Service: Cardiovascular;  Laterality: N/A;  . PERIPHERAL VASCULAR CATHETERIZATION  11/23/2014   Procedure: Lower Extremity Angiography;  Surgeon: Serafina Mitchell, MD;  Location: [place] [facility] LAB;  Service: Cardiovascular;;  . PERIPHERAL VASCULAR CATHETERIZATION  11/23/2014   Procedure: Peripheral Vascular Intervention;  Surgeon: Serafina Mitchell, MD;  Location: [place] [facility] LAB;  Service: Cardiovascular;;  PTA bilateral common iliac PTA/DCB left fem-pop  . POLYPECTOMY    . 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 SP ENDO REPAIR INFRAREN AAA      Current Outpatient Medications  Medication Sig Dispense Refill  . albuterol (PROVENTIL HFA;VENTOLIN HFA) 108 (90 Base) MCG/ACT inhaler Inhale 2 puffs into the lungs every 6 (six) hours as needed for wheezing or shortness of breath. 1 Inhaler 1  . amLODipine (NORVASC) 5 MG tablet TAKE (2) TABLETS BY MOUTH DAILY. 60 tablet 0  . ascorbic Acid (VITAMIN C CR) 500 MG CPCR Take 500 mg by mouth daily.     Marland Kitchen  atorvastatin (LIPITOR) 40 MG tablet TAKE 1 TABLET ONCE DAILY. 90 tablet 0  . cetirizine (ZYRTEC) 10 MG tablet Take 1 tablet (10 mg total) by mouth daily. 90 tablet 3  . chlorpheniramine-HYDROcodone (TUSSIONEX PENNKINETIC ER) 10-8 MG/5ML SUER Take 5 mLs by mouth every 12 (twelve) hours as needed for cough. 140 mL 0  . clopidogrel (PLAVIX) 75 MG tablet Take 1 tablet (75 mg total) by mouth daily. 90 tablet 3  . docusate sodium (COLACE) 100 MG capsule Take 100 mg by mouth 2 (two) times daily.     Marland Kitchen ELIQUIS 5 MG TABS tablet TAKE 1 TABLET BY MOUTH TWICE DAILY.  180 tablet 1  . famotidine (PEPCID) 20 MG tablet Take 20 mg by mouth 2 (two) times daily.    . Fiber CAPS Take 5.7 mg by mouth See admin instructions. Take (10) 0.57 capsules by mouth daily for regularity.    . folic acid (FOLVITE) 1 MG tablet Take 1 tablet (1 mg total) by mouth daily. 90 tablet 3  . furosemide (LASIX) 40 MG tablet Take 40 mg by mouth daily.    Marland Kitchen gemfibrozil (LOPID) 600 MG tablet TAKE 2 TABLETS DAILY AS DIRECTED. 180 tablet 0  . metFORMIN (GLUCOPHAGE-XR) 500 MG 24 hr tablet Take 4 tablets (2,000 mg total) by mouth daily. 4 tabs by mouth in the AM 360 tablet 3  . metroNIDAZOLE (METROGEL) 0.75 % gel Apply 1 application topically daily.     . Multiple Vitamin (MULTIVITAMIN) capsule Take 1 capsule by mouth daily.     Marland Kitchen olmesartan (BENICAR) 40 MG tablet Take 1 tablet (40 mg total) by mouth daily. Follow-up appt due in August must see provider for future refills 90 tablet 0  . olmesartan-hydrochlorothiazide (BENICAR HCT) 40-25 MG tablet Take 1 tablet by mouth daily. 90 tablet 3  . pioglitazone (ACTOS) 30 MG tablet Take 1 tablet (30 mg total) by mouth daily. 90 tablet 3  . triamcinolone (NASACORT) 55 MCG/ACT AERO nasal inhaler Place 2 sprays into the nose daily. (Patient taking differently: Place 1 spray into the nose at bedtime. ) 1 Inhaler 12   No current facility-administered medications for this visit.     Allergies  Allergen Reactions  . Latex Rash    Social History   Socioeconomic History  . Marital status: Married    Spouse name: Not on file  . Number of children: 1  . Years of education: Not on file  . Highest education level: Not on file  Occupational History  . Occupation: dsiabled due to stroke    Employer: RETIRED  Social Needs  . Financial resource strain: Not on file  . Food insecurity    Worry: Not on file    Inability: Not on file  . Transportation needs    Medical: Not on file    Non-medical: Not on file  Tobacco Use  . Smoking status: Former  Smoker    Types: Cigarettes    Quit date: 01/14/1992    Years since quitting: 26.8  . Smokeless tobacco: Never Used  Substance and Sexual Activity  . Alcohol use: Yes    Alcohol/week: 2.0 standard drinks    Types: 2 Glasses of wine per week    Comment: wine daily  . Drug use: No  . Sexual activity: Not on file  Lifestyle  . Physical activity    Days per week: Not on file    Minutes per session: Not on file  . Stress: Not on file  Relationships  .  Social Herbalist on phone: Not on file    Gets together: Not on file    Attends religious service: Not on file    Active member of club or organization: Not on file    Attends meetings of clubs or organizations: Not on file    Relationship status: Not on file  . Intimate partner violence    Fear of current or ex partner: Not on file    Emotionally abused: Not on file    Physically abused: Not on file    Forced sexual activity: Not on file  Other Topics Concern  . Not on file  Social History Narrative   2 cups of coffee in the morning and tea throughout the day.    Family History  Problem Relation Age of Onset  . Aortic aneurysm Mother        desceding aoritic aneurysm  . Cancer Brother        Lung cancer  . Colon cancer Neg Hx   . Esophageal cancer Neg Hx   . Stomach cancer Neg Hx   . Rectal cancer Neg Hx     Review of Systems:  As stated in the HPI and otherwise negative.   BP (!) 144/78   Pulse 73   Ht 5\' 10"  (1.778 m)   Wt 270 lb (122.5 kg)   SpO2 98%   BMI 38.74 kg/m   Physical Examination:  General: Well developed, well nourished, NAD  HEENT: OP clear, mucus membranes moist  SKIN: warm, dry. No rashes. Neuro: No focal deficits  Musculoskeletal: Muscle strength 5/5 all ext  Psychiatric: Mood and affect normal  Neck: No JVD, no carotid bruits, no thyromegaly, no lymphadenopathy.  Lungs:Clear bilaterally, no wheezes, rhonci, crackles Cardiovascular: Regular rate and rhythm. No murmurs, gallops  or rubs. Abdomen:Soft. Bowel sounds present. Non-tender.  Extremities: No lower extremity edema. Pulses are 2 + in the bilateral DP/PT.  Echo July 2016: Left ventricle: The cavity size was normal. Wall thickness was   normal. Systolic function was normal. The estimated ejection   fraction was in the range of 55% to 60%. Wall motion was normal;   there were no regional wall motion abnormalities. - Ascending aorta: The ascending aorta was mildly dilated. - Mitral valve: There was mild regurgitation. - Left atrium: The atrium was moderately dilated. - Right atrium: The atrium was mildly dilated.  EKG:  EKG is ordered today. The ekg ordered today demonstrates atrial fib, rate 73 bpm  Recent Labs: 06/22/2018: ALT 10; BUN 44; Creatinine, Ser 1.17; Hemoglobin 11.2; Platelets 233.0; Potassium 3.9; Sodium 137; TSH 1.89   Lipid Panel    Component Value Date/Time   CHOL 124 06/22/2018 0838   TRIG 182.0 (H) 06/22/2018 0838   HDL 34.40 (L) 06/22/2018 0838   CHOLHDL 4 06/22/2018 0838   VLDL 36.4 06/22/2018 0838   LDLCALC 53 06/22/2018 0838   LDLDIRECT 51.0 03/15/2016 0816     Wt Readings from Last 3 Encounters:  11/26/18 270 lb (122.5 kg)  06/23/18 265 lb (120.2 kg)  04/01/18 280 lb (127 kg)     Other studies Reviewed: Additional studies/ records that were reviewed today include: all old records, hospital, echo images.  Review of the above records demonstrates:    Assessment and Plan:   1. Persistent atrial fibrillation: He is in atrial fib today. HR is 73 bpm on no AV nodal blocking agents. Continue Eliquis.     2. Essential hypertension: BP is controlled  at home.   3. HLD: Lipids followed in primary care. Continue statin  4. Peripheral vascular disease/carotid disease, AAA: Followed in [facility]  5. Mitral regurgitation: mild by echo 2016. Repeat echo in 6 months. .   Current medicines are reviewed at length with the patient today.  The patient does not have concerns regarding  medicines.  The following changes have been made:  no change  Labs/ tests ordered today include:   Orders Placed This Encounter  Procedures  . EKG 12-Lead  . ECHOCARDIOGRAM COMPLETE    Disposition:   FU with me in 12 months  Signed, Lauree Chandler, MD 11/26/2018 10:44 AM    [facility] [address], [place], [place]  [address] Phone: [phone]; Fax: [phone]

## 2018-11-26 NOTE — Patient Instructions (Signed)
Medication Instructions:  Your physician recommends that you continue on your current medications as directed. Please refer to the Current Medication list given to you today.  If you need a refill on your cardiac medications before your next appointment, please call your pharmacy.   Lab work: None If you have labs (blood work) drawn today and your tests are completely normal, you will receive your results only by: Marland Kitchen MyChart Message (if you have MyChart) OR . A paper copy in the mail If you have any lab test that is abnormal or we need to change your treatment, we will call you to review the results.  Testing/Procedures: Your physician has requested that you have an echocardiogram in 6 months. Echocardiography is a painless test that uses sound waves to create images of your heart. It provides your doctor with information about the size and shape of your heart and how well your heart's chambers and valves are working. This procedure takes approximately one hour. There are no restrictions for this procedure.    Follow-Up: At [facility], you and your health needs are our priority.  As part of our continuing mission to provide you with exceptional heart care, we have created designated Provider Care Teams.  These Care Teams include your primary Cardiologist (physician) and Advanced Practice Providers (APPs -  Physician Assistants and Nurse Practitioners) who all work together to provide you with the care you need, when you need it. You will need a follow up appointment in 12 months.  Please call our office 2 months in advance to schedule this appointment.  You may see Lauree Chandler, MD or one of the following Advanced Practice Providers on your designated Care Team:   B[place], PA-C Melina Copa, PA-C . Ermalinda Barrios, PA-C  Any Other Special Instructions Will Be Listed Below (If Applicable).

## 2018-12-11 DIAGNOSIS — E669 Obesity, unspecified: Secondary | ICD-10-CM | POA: Diagnosis not present

## 2018-12-11 DIAGNOSIS — R609 Edema, unspecified: Secondary | ICD-10-CM | POA: Diagnosis not present

## 2018-12-11 DIAGNOSIS — R809 Proteinuria, unspecified: Secondary | ICD-10-CM | POA: Diagnosis not present

## 2018-12-11 DIAGNOSIS — E559 Vitamin D deficiency, unspecified: Secondary | ICD-10-CM | POA: Diagnosis not present

## 2018-12-11 DIAGNOSIS — I129 Hypertensive chronic kidney disease with stage 1 through stage 4 chronic kidney disease, or unspecified chronic kidney disease: Secondary | ICD-10-CM | POA: Diagnosis not present

## 2018-12-18 DIAGNOSIS — H5213 Myopia, bilateral: Secondary | ICD-10-CM | POA: Diagnosis not present

## 2018-12-18 DIAGNOSIS — E119 Type 2 diabetes mellitus without complications: Secondary | ICD-10-CM | POA: Diagnosis not present

## 2018-12-18 DIAGNOSIS — H2513 Age-related nuclear cataract, bilateral: Secondary | ICD-10-CM | POA: Diagnosis not present

## 2018-12-18 LAB — HM DIABETES EYE EXAM

## 2018-12-22 ENCOUNTER — Telehealth: Payer: Self-pay | Admitting: Emergency Medicine

## 2018-12-22 ENCOUNTER — Other Ambulatory Visit (INDEPENDENT_AMBULATORY_CARE_PROVIDER_SITE_OTHER): Payer: Medicare Other

## 2018-12-22 DIAGNOSIS — E1121 Type 2 diabetes mellitus with diabetic nephropathy: Secondary | ICD-10-CM | POA: Diagnosis not present

## 2018-12-22 DIAGNOSIS — R972 Elevated prostate specific antigen [PSA]: Secondary | ICD-10-CM

## 2018-12-22 LAB — LIPID PANEL
Cholesterol: 157 mg/dL (ref 0–200)
HDL: 35.5 mg/dL — ABNORMAL LOW (ref >39.00)
NonHDL: 121.65
Total CHOL/HDL Ratio: 4
Triglycerides: 258 mg/dL — ABNORMAL HIGH (ref 0.0–149.0)
VLDL: 51.6 mg/dL — ABNORMAL HIGH (ref 0.0–40.0)

## 2018-12-22 LAB — LDL CHOLESTEROL, DIRECT: Direct LDL: 56 mg/dL

## 2018-12-22 LAB — HEPATIC FUNCTION PANEL
ALT: 11 U/L (ref 0–53)
AST: 17 U/L (ref 0–37)
Albumin: 4.4 g/dL (ref 3.5–5.2)
Alkaline Phosphatase: 32 U/L — ABNORMAL LOW (ref 39–117)
Bilirubin, Direct: 0.1 mg/dL (ref 0.0–0.3)
Total Bilirubin: 0.4 mg/dL (ref 0.2–1.2)
Total Protein: 7.6 g/dL (ref 6.0–8.3)

## 2018-12-22 LAB — HEMOGLOBIN A1C: Hgb A1c MFr Bld: 5.7 % (ref 4.6–6.5)

## 2018-12-22 LAB — BASIC METABOLIC PANEL
BUN: 36 mg/dL — ABNORMAL HIGH (ref 6–23)
CO2: 27 mEq/L (ref 19–32)
Calcium: 9.9 mg/dL (ref 8.4–10.5)
Chloride: 103 mEq/L (ref 96–112)
Creatinine, Ser: 1.18 mg/dL (ref 0.40–1.50)
GFR: 60.99 mL/min (ref >60.00)
Glucose, Bld: 124 mg/dL — ABNORMAL HIGH (ref 70–99)
Potassium: 4.3 mEq/L (ref 3.5–5.1)
Sodium: 139 mEq/L (ref 135–145)

## 2018-12-22 NOTE — Telephone Encounter (Signed)
Pt came in and stated he just had his blood work done for his appt tomorrow. He is wondering if he can have lab order put in to have his PSA drawn as well. Thanks.

## 2018-12-23 ENCOUNTER — Other Ambulatory Visit: Payer: Self-pay | Admitting: Internal Medicine

## 2018-12-23 ENCOUNTER — Ambulatory Visit (INDEPENDENT_AMBULATORY_CARE_PROVIDER_SITE_OTHER): Payer: Medicare Other | Admitting: Internal Medicine

## 2018-12-23 ENCOUNTER — Encounter: Payer: Self-pay | Admitting: Internal Medicine

## 2018-12-23 ENCOUNTER — Other Ambulatory Visit: Payer: Self-pay

## 2018-12-23 VITALS — BP 122/68 | HR 63 | Temp 98.5°F | Ht 70.0 in | Wt 269.0 lb

## 2018-12-23 DIAGNOSIS — Z23 Encounter for immunization: Secondary | ICD-10-CM

## 2018-12-23 DIAGNOSIS — E1121 Type 2 diabetes mellitus with diabetic nephropathy: Secondary | ICD-10-CM

## 2018-12-23 DIAGNOSIS — I10 Essential (primary) hypertension: Secondary | ICD-10-CM | POA: Diagnosis not present

## 2018-12-23 DIAGNOSIS — E785 Hyperlipidemia, unspecified: Secondary | ICD-10-CM | POA: Diagnosis not present

## 2018-12-23 NOTE — Assessment & Plan Note (Signed)
stable overall by history and exam, recent data reviewed with pt, and pt to continue medical treatment as before,  to f/u any worsening symptoms or concerns  

## 2018-12-23 NOTE — Patient Instructions (Signed)
Please continue all other medications as before, and refills have been done if requested.  Please have the pharmacy call with any other refills you may need.  Please continue your efforts at being more active, low cholesterol diet, and weight control.  Please keep your appointments with your specialists as you may have planned  Please return in 6 months, or sooner if needed 

## 2018-12-23 NOTE — Progress Notes (Signed)
Subjective:    Patient ID: David [name], male    DOB: July 18, 1950, 68 y.o.   MRN: [id number]  HPI  Here to f/u; overall doing ok,  Pt denies chest pain, increasing sob or doe, wheezing, orthopnea, PND, increased LE swelling, palpitations, dizziness or syncope.  Pt denies new neurological symptoms such as new headache, or facial or extremity weakness or numbness.  Pt denies polydipsia, polyuria, or low sugar episode.  Pt states overall good compliance with meds, mostly trying to follow appropriate diet, with wt overall stable,  but little exercise however. Sees renal every 6 mo renal for proteinuria.  No leg pain and to f/u arterial exam with vascular surgury.  Recently seen per cards Wt Readings from Last 3 Encounters:  12/23/18 269 lb (122 kg)  11/26/18 270 lb (122.5 kg)  06/23/18 265 lb (120.2 kg)   Past Medical History:  Diagnosis Date  . AAA (abdominal aortic aneurysm) ([place])   . 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 ([place]) 09/07/2008  . Anticardiolipin antibody positive   . Anxiety   . ANXIETY 07/19/2008  . CEREBROVASCULAR ACCIDENT, HX OF 07/19/2008  . CVA (cerebral vascular accident) ([place]) 2003  . Depression   . DEPRESSION 07/19/2008  . Diabetes mellitus    type II  . DIABETES MELLITUS, TYPE II 01/18/2008  . Diverticulosis   . GERD 07/19/2008  . GERD (gastroesophageal reflux disease)   . Hemorrhoids   . Hx of adenomatous colonic polyps   . Hyperlipidemia   . HYPERLIPIDEMIA 07/19/2008  . Hypertension   . HYPERTENSION 07/31/2009  . Increased prostate specific antigen (PSA) velocity 09/13/2010  . Internal hemorrhoids   . Perineal abscess   . Peripheral vascular disease ([place])    peripheral vascular disease/carotid artery <100%  . PERSONAL HX COLONIC POLYPS 12/10/2007  . Unspecified Peripheral Vascular Disease 01/18/2008  . Varicose veins    Past Surgical History:  Procedure Laterality Date  . 7 abdominal hernia repairs  05/31/10  . ABDOMINAL AORTAGRAM N/A 01/12/2013   Procedure:  ABDOMINAL Maxcine Ham;  Surgeon: Serafina Mitchell, MD;  Location: [facility] CATH LAB;  Service: Cardiovascular;  Laterality: N/A;  . ABDOMINAL AORTIC ANEURYSM REPAIR    . COLONOSCOPY    . hernia surgury     68 years old  . perineal abscess    . PERIPHERAL VASCULAR CATHETERIZATION N/A 11/23/2014   Procedure: Abdominal Aortogram;  Surgeon: Serafina Mitchell, MD;  Location: [facility] LAB;  Service: Cardiovascular;  Laterality: N/A;  . PERIPHERAL VASCULAR CATHETERIZATION  11/23/2014   Procedure: Lower Extremity Angiography;  Surgeon: Serafina Mitchell, MD;  Location: [place] CV LAB;  Service: Cardiovascular;;  . PERIPHERAL VASCULAR CATHETERIZATION  11/23/2014   Procedure: Peripheral Vascular Intervention;  Surgeon: Serafina Mitchell, MD;  Location: [facility];  Service: Cardiovascular;;  PTA bilateral common iliac PTA/DCB left fem-pop  . POLYPECTOMY    . 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 SP ENDO REPAIR INFRAREN AAA      reports that he quit smoking about 26 years ago. His smoking use included cigarettes. He has never used smokeless tobacco. He reports current alcohol use of about 2.0 standard drinks of alcohol per week. He reports that he does not use drugs. family history includes Aortic aneurysm in his mother; Cancer in his brother. Allergies  Allergen Reactions  . Latex Rash   Current Outpatient Medications on File Prior to Visit  Medication Sig Dispense Refill  . albuterol (  PROVENTIL HFA;VENTOLIN HFA) 108 (90 Base) MCG/ACT inhaler Inhale 2 puffs into the lungs every 6 (six) hours as needed for wheezing or shortness of breath. 1 Inhaler 1  . amLODipine (NORVASC) 5 MG tablet TAKE (2) TABLETS BY MOUTH DAILY. 60 tablet 0  . ascorbic Acid (VITAMIN C CR) 500 MG CPCR Take 500 mg by mouth daily.     Marland Kitchen atorvastatin (LIPITOR) 40 MG tablet TAKE 1 TABLET ONCE DAILY. 90 tablet 0  . cetirizine (ZYRTEC) 10 MG tablet Take 1 tablet (10 mg total) by mouth daily. 90 tablet 3  .  chlorpheniramine-HYDROcodone (TUSSIONEX PENNKINETIC ER) 10-8 MG/5ML SUER Take 5 mLs by mouth every 12 (twelve) hours as needed for cough. 140 mL 0  . clopidogrel (PLAVIX) 75 MG tablet Take 1 tablet (75 mg total) by mouth daily. 90 tablet 3  . docusate sodium (COLACE) 100 MG capsule Take 100 mg by mouth 2 (two) times daily.     Marland Kitchen ELIQUIS 5 MG TABS tablet TAKE 1 TABLET BY MOUTH TWICE DAILY. 180 tablet 1  . famotidine (PEPCID) 20 MG tablet Take 20 mg by mouth 2 (two) times daily.    . Fiber CAPS Take 5.7 mg by mouth See admin instructions. Take (10) 0.57 capsules by mouth daily for regularity.    . folic acid (FOLVITE) 1 MG tablet Take 1 tablet (1 mg total) by mouth daily. 90 tablet 3  . furosemide (LASIX) 40 MG tablet Take 40 mg by mouth daily.    Marland Kitchen gemfibrozil (LOPID) 600 MG tablet TAKE 2 TABLETS DAILY AS DIRECTED. 180 tablet 0  . metFORMIN (GLUCOPHAGE-XR) 500 MG 24 hr tablet Take 4 tablets (2,000 mg total) by mouth daily. 4 tabs by mouth in the AM 360 tablet 3  . metroNIDAZOLE (METROGEL) 0.75 % gel Apply 1 application topically daily.     . Multiple Vitamin (MULTIVITAMIN) capsule Take 1 capsule by mouth daily.     Marland Kitchen olmesartan (BENICAR) 40 MG tablet Take 1 tablet (40 mg total) by mouth daily. Follow-up appt due in August must see provider for future refills 90 tablet 0  . olmesartan-hydrochlorothiazide (BENICAR HCT) 40-25 MG tablet Take 1 tablet by mouth daily. 90 tablet 3  . pioglitazone (ACTOS) 30 MG tablet Take 1 tablet (30 mg total) by mouth daily. 90 tablet 3  . triamcinolone (NASACORT) 55 MCG/ACT AERO nasal inhaler Place 2 sprays into the nose daily. (Patient taking differently: Place 1 spray into the nose at bedtime. ) 1 Inhaler 12   No current facility-administered medications on file prior to visit.    Review of Systems  Constitutional: Negative for other unusual diaphoresis or sweats HENT: Negative for ear discharge or swelling Eyes: Negative for other worsening visual disturbances  Respiratory: Negative for stridor or other swelling  Gastrointestinal: Negative for worsening distension or other blood Genitourinary: Negative for retention or other urinary change Musculoskeletal: Negative for other MSK pain or swelling Skin: Negative for color change or other new lesions Neurological: Negative for worsening tremors and other numbness  Psychiatric/Behavioral: Negative for worsening agitation or other fatigue All other system neg per pt    Objective:   Physical Exam BP 122/68   Pulse 63   Temp 98.5 F (36.9 C) (Oral)   Ht 5\' 10"  (1.778 m)   Wt 269 lb (122 kg)   SpO2 96%   BMI 38.60 kg/m  VS noted,  Constitutional: Pt appears in NAD HENT: Head: NCAT.  Right Ear: External ear normal.  Left Ear: External ear  normal.  Eyes: . Pupils are equal, round, and reactive to light. Conjunctivae and EOM are normal Nose: without d/c or deformity Neck: Neck supple. Gross normal ROM Cardiovascular: Normal rate and regular rhythm.   Pulmonary/Chest: Effort normal and breath sounds without rales or wheezing.  Abd:  Soft, NT, ND, + BS, no organomegaly Neurological: Pt is alert. At baseline orientation, motor grossly intact Skin: Skin is warm. No rashes, other new lesions, no LE edema Psychiatric: Pt behavior is normal without agitation  No other exam findings Lab Results  Component Value Date   WBC 6.3 06/22/2018   HGB 11.2 (L) 06/22/2018   HCT 33.2 (L) 06/22/2018   PLT 233.0 06/22/2018   GLUCOSE 124 (H) 12/22/2018   CHOL 157 12/22/2018   TRIG 258.0 (H) 12/22/2018   HDL 35.50 (L) 12/22/2018   LDLDIRECT 56.0 12/22/2018   LDLCALC 53 06/22/2018   ALT 11 12/22/2018   AST 17 12/22/2018   NA 139 12/22/2018   K 4.3 12/22/2018   CL 103 12/22/2018   CREATININE 1.18 12/22/2018   BUN 36 (H) 12/22/2018   CO2 27 12/22/2018   TSH 1.89 06/22/2018   PSA 2.45 03/15/2016   INR 1.17 10/16/2017   HGBA1C 5.7 12/22/2018   MICROALBUR 173.4 (H) 11/12/2016       Assessment &  Plan:

## 2018-12-28 ENCOUNTER — Ambulatory Visit (INDEPENDENT_AMBULATORY_CARE_PROVIDER_SITE_OTHER): Payer: Medicare Other | Admitting: Internal Medicine

## 2018-12-28 ENCOUNTER — Encounter: Payer: Self-pay | Admitting: Internal Medicine

## 2018-12-28 ENCOUNTER — Other Ambulatory Visit: Payer: Self-pay

## 2018-12-28 DIAGNOSIS — E1121 Type 2 diabetes mellitus with diabetic nephropathy: Secondary | ICD-10-CM | POA: Diagnosis not present

## 2018-12-28 DIAGNOSIS — B029 Zoster without complications: Secondary | ICD-10-CM

## 2018-12-28 DIAGNOSIS — I10 Essential (primary) hypertension: Secondary | ICD-10-CM

## 2018-12-28 MED ORDER — VALACYCLOVIR HCL 1 G PO TABS: 1000.0000 mg | ORAL_TABLET | Freq: Three times a day (TID) | ORAL | 0 refills | Status: DC

## 2018-12-28 MED ORDER — LIDOCAINE 5 % EX PTCH: 1.0000 | MEDICATED_PATCH | CUTANEOUS | 0 refills | Status: DC

## 2018-12-28 NOTE — Patient Instructions (Signed)
Please take all new medication as prescribed - the valtrex (antibiotic) and the lidocaine patch as needed  OK to also try the OTC lidocaine creams topically such as Blu Emu with lidocaine  Please continue all other medications as before, and refills have been done if requested.  Please have the pharmacy call with any other refills you may need.  Please continue your efforts at being more active, low cholesterol diet, and weight control.  Please keep your appointments with your specialists as you may have planned

## 2018-12-28 NOTE — Progress Notes (Signed)
Subjective:    Patient ID: David [name], male    DOB: 1950-08-30, 68 y.o.   MRN: [id number]  HPI  Here with c/o acute onset right sided and right flank pain x 4 days, no rash, intermittent, dull and burning sometimes, mild to mod, Denies worsening reflux, dysphagia, n/v, bowel change or blood except for a bout of diarrhea 3 days ago, now resolved. No fever, chills.  Denies urinary symptoms such as dysuria, frequency, urgency, flank pain, hematuria or n/v, fever, chills.  Thought might be muscle spasm but is lasting longer than usual.  Has been seeing Renal for proteinuria s/p renal bx about 1.5 yrs ago, with protein now improved.  Area of pain not really worse to palpate except for the flank where tender and massage right flank can make somewhat better.  Pt denies chest pain, increased sob or doe, wheezing, orthopnea, PND, increased LE swelling, palpitations, dizziness or syncope.   Pt denies polydipsia, polyuria, Past Medical History:  Diagnosis Date  . AAA (abdominal aortic aneurysm) ([place])   . 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 Anticardiolipin antibody positive   . Anxiety   . ANXIETY 07/19/2008  . CEREBROVASCULAR ACCIDENT, HX OF 07/19/2008  . CVA (cerebral vascular accident) ([place]) 2003  . Depression   . DEPRESSION 07/19/2008  . Diabetes mellitus    type II  . DIABETES MELLITUS, TYPE II 01/18/2008  . Diverticulosis   . GERD 07/19/2008  . GERD (gastroesophageal reflux disease)   . Hemorrhoids   . Hx of adenomatous colonic polyps   . Hyperlipidemia   . HYPERLIPIDEMIA 07/19/2008  . Hypertension   . HYPERTENSION 07/31/2009  . Increased prostate specific antigen (PSA) velocity 09/13/2010  . Internal hemorrhoids   . Perineal abscess   . Peripheral vascular disease ([place])    peripheral vascular disease/carotid artery <100%  . PERSONAL HX COLONIC POLYPS 12/10/2007  . Unspecified Peripheral Vascular Disease 01/18/2008  . Varicose veins    Past Surgical History:  Procedure  Laterality Date  . 7 abdominal hernia repairs  05/31/10  . ABDOMINAL AORTAGRAM N/A 01/12/2013   Procedure: ABDOMINAL Maxcine Ham;  Surgeon: Serafina Mitchell, MD;  Location[facility] CATH LAB;  Service: Cardiovascular;  Laterality: N/A;  . ABDOMINAL AORTIC ANEURYSM REPAIR    . COLONOSCOPY    . hernia surgury     68 years old  . perineal abscess    . PERIPHERAL VASCULAR CATHETERIZATION N/A 11/23/2014   Procedure: Abdominal Aortogram;  Surgeon: Serafina Mitchell, MD;  Location: [place] CV LAB;  Service: Cardiovascular;  Laterality: N/A;  . PERIPHERAL VASCULAR CATHETERIZATION  11/23/2014   Procedure: Lower Extremity Angiography;  Surgeon: Serafina Mitchell, MD;  Location: [place] CV LAB;  Service: Cardiovascular;;  . PERIPHERAL VASCULAR CATHETERIZATION  11/23/2014   Procedure: Peripheral Vascular Intervention;  Surgeon: Serafina Mitchell, MD;  Location: [place] CV LAB;  Service: Cardiovascular;;  PTA bilateral common iliac PTA/DCB left fem-pop  . POLYPECTOMY    . 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 SP ENDO REPAIR INFRAREN AAA      reports that he quit smoking about 26 years ago. His smoking use included cigarettes. He has never used smokeless tobacco. He reports current alcohol use of about 2.0 standard drinks of alcohol per week. He reports that he does not use drugs. family history includes Aortic aneurysm in his mother; Cancer in his brother. Allergies  Allergen Reactions  . Latex Rash  Current Outpatient Medications on File Prior to Visit  Medication Sig Dispense Refill  . albuterol (PROVENTIL HFA;VENTOLIN HFA) 108 (90 Base) MCG/ACT inhaler Inhale 2 puffs into the lungs every 6 (six) hours as needed for wheezing or shortness of breath. 1 Inhaler 1  . amLODipine (NORVASC) 5 MG tablet TAKE (2) TABLETS BY MOUTH DAILY. 60 tablet 0  . ascorbic Acid (VITAMIN C CR) 500 MG CPCR Take 500 mg by mouth daily.     Marland Kitchen atorvastatin (LIPITOR) 40 MG tablet TAKE 1 TABLET ONCE DAILY. 90 tablet 0  .  cetirizine (ZYRTEC) 10 MG tablet Take 1 tablet (10 mg total) by mouth daily. 90 tablet 3  . chlorpheniramine-HYDROcodone (TUSSIONEX PENNKINETIC ER) 10-8 MG/5ML SUER Take 5 mLs by mouth every 12 (twelve) hours as needed for cough. 140 mL 0  . clopidogrel (PLAVIX) 75 MG tablet TAKE 1 TABLET EACH DAY. 90 tablet 0  . docusate sodium (COLACE) 100 MG capsule Take 100 mg by mouth 2 (two) times daily.     Marland Kitchen ELIQUIS 5 MG TABS tablet TAKE 1 TABLET BY MOUTH TWICE DAILY. 180 tablet 1  . famotidine (PEPCID) 20 MG tablet Take 20 mg by mouth 2 (two) times daily.    . Fiber CAPS Take 5.7 mg by mouth See admin instructions. Take (10) 0.57 capsules by mouth daily for regularity.    . folic acid (FOLVITE) 1 MG tablet Take 1 tablet (1 mg total) by mouth daily. 90 tablet 3  . furosemide (LASIX) 40 MG tablet Take 40 mg by mouth daily.    Marland Kitchen gemfibrozil (LOPID) 600 MG tablet TAKE 2 TABLETS BY MOUTH ONCE DAILY. 180 tablet 0  . metFORMIN (GLUCOPHAGE-XR) 500 MG 24 hr tablet Take 4 tablets (2,000 mg total) by mouth daily. 4 tabs by mouth in the AM 360 tablet 3  . metroNIDAZOLE (METROGEL) 0.75 % gel Apply 1 application topically daily.     . Multiple Vitamin (MULTIVITAMIN) capsule Take 1 capsule by mouth daily.     Marland Kitchen olmesartan (BENICAR) 40 MG tablet Take 1 tablet (40 mg total) by mouth daily. Follow-up appt due in August must see provider for future refills 90 tablet 0  . olmesartan-hydrochlorothiazide (BENICAR HCT) 40-25 MG tablet Take 1 tablet by mouth daily. 90 tablet 3  . pioglitazone (ACTOS) 30 MG tablet TAKE 1 TABLET EACH DAY. 90 tablet 0  . triamcinolone (NASACORT) 55 MCG/ACT AERO nasal inhaler Place 2 sprays into the nose daily. (Patient taking differently: Place 1 spray into the nose at bedtime. ) 1 Inhaler 12   No current facility-administered medications on file prior to visit.    Review of Systems  Constitutional: Negative for other unusual diaphoresis or sweats HENT: Negative for ear discharge or swelling  Eyes: Negative for other worsening visual disturbances Respiratory: Negative for stridor or other swelling  Gastrointestinal: Negative for worsening distension or other blood Genitourinary: Negative for retention or other urinary change Musculoskeletal: Negative for other MSK pain or swelling Skin: Negative for color change or other new lesions Neurological: Negative for worsening tremors and other numbness  Psychiatric/Behavioral: Negative for worsening agitation or other fatigue All other system neg per pt    Objective:   Physical Exam BP 122/76   Pulse 80   Temp 97.9 F (36.6 C) (Oral)   Ht 5\' 10"  (1.778 m)   Wt 260 lb (117.9 kg)   SpO2 98%   BMI 37.31 kg/m  VS noted,  Constitutional: Pt appears in NAD HENT: Head: NCAT.  Right  Ear: External ear normal.  Left Ear: External ear normal.  Eyes: . Pupils are equal, round, and reactive to light. Conjunctivae and EOM are normal Nose: without d/c or deformity Neck: Neck supple. Gross normal ROM Cardiovascular: Normal rate and regular rhythm.   Pulmonary/Chest: Effort normal and breath sounds without rales or wheezing.  Abd:  Soft, NT, ND, + BS, no organomegaly, but has rash to approx right l2 dermatome of grouped vesicles on erythematous base Neurological: Pt is alert. At baseline orientation, motor grossly intact Skin: Skin is warm. No rashes, other new lesions, no LE edema Psychiatric: Pt behavior is normal without agitation  No other exam findings Lab Results  Component Value Date   WBC 6.3 06/22/2018   HGB 11.2 (L) 06/22/2018   HCT 33.2 (L) 06/22/2018   PLT 233.0 06/22/2018   GLUCOSE 124 (H) 12/22/2018   CHOL 157 12/22/2018   TRIG 258.0 (H) 12/22/2018   HDL 35.50 (L) 12/22/2018   LDLDIRECT 56.0 12/22/2018   LDLCALC 53 06/22/2018   ALT 11 12/22/2018   AST 17 12/22/2018   NA 139 12/22/2018   K 4.3 12/22/2018   CL 103 12/22/2018   CREATININE 1.18 12/22/2018   BUN 36 (H) 12/22/2018   CO2 27 12/22/2018   TSH 1.89  06/22/2018   PSA 2.45 03/15/2016   INR 1.17 10/16/2017   HGBA1C 5.7 12/22/2018   MICROALBUR 173.4 (H) 11/12/2016        Assessment & Plan:

## 2018-12-30 ENCOUNTER — Encounter: Payer: Self-pay | Admitting: Internal Medicine

## 2018-12-30 MED ORDER — GABAPENTIN 100 MG PO CAPS: 100.0000 mg | ORAL_CAPSULE | Freq: Three times a day (TID) | ORAL | 3 refills | Status: DC

## 2019-01-01 ENCOUNTER — Encounter: Payer: Self-pay | Admitting: Internal Medicine

## 2019-01-01 NOTE — Assessment & Plan Note (Signed)
Acute onset, gradually worsening, for valtrex asd, lidocaine patch, gabapentin asd,  to f/u any worsening symptoms or concerns

## 2019-01-01 NOTE — Assessment & Plan Note (Signed)
stable overall by history and exam, recent data reviewed with pt, and pt to continue medical treatment as before,  to f/u any worsening symptoms or concerns  

## 2019-01-07 ENCOUNTER — Encounter

## 2019-02-07 ENCOUNTER — Other Ambulatory Visit: Payer: Self-pay | Admitting: Internal Medicine

## 2019-02-11 ENCOUNTER — Encounter: Payer: Self-pay | Admitting: Internal Medicine

## 2019-02-12 MED ORDER — ZOSTER VAC RECOMB ADJUVANTED 50 MCG/0.5ML IM SUSR: 0.5000 mL | Freq: Once | INTRAMUSCULAR | 1 refills | Status: AC

## 2019-03-21 ENCOUNTER — Other Ambulatory Visit: Payer: Self-pay | Admitting: Internal Medicine

## 2019-05-13 ENCOUNTER — Other Ambulatory Visit: Payer: Self-pay | Admitting: Internal Medicine

## 2019-05-13 ENCOUNTER — Other Ambulatory Visit: Payer: Self-pay | Admitting: Cardiovascular Disease

## 2019-05-13 DIAGNOSIS — I4819 Other persistent atrial fibrillation: Secondary | ICD-10-CM

## 2019-05-13 NOTE — Telephone Encounter (Signed)
Age 69, weight 118kg, SCr 1.18 on 12/22/18, last OV July 2020, afib indication

## 2019-05-13 NOTE — Telephone Encounter (Signed)
Please refill as per office routine med refill policy (all routine meds refilled for 3 mo or monthly per pt preference up to one year from last visit, then month to month grace period for 3 mo, then further med refills will have to be denied)  

## 2019-05-18 ENCOUNTER — Telehealth (HOSPITAL_COMMUNITY): Payer: Self-pay | Admitting: Radiology

## 2019-05-18 NOTE — Telephone Encounter (Signed)
Patient cancelled echocardiogram appointment for 1.18.2. Wants to wait until COVID pandemic is over. Will remove from WQ. Place order when needed. Thanks

## 2019-05-18 NOTE — Telephone Encounter (Signed)
Will send message to ordering doctor and his nurse.

## 2019-05-18 NOTE — Telephone Encounter (Signed)
Can we keep this on our radar? Thanks, chris

## 2019-05-24 ENCOUNTER — Other Ambulatory Visit (HOSPITAL_COMMUNITY): Payer: Medicare Other

## 2019-06-03 ENCOUNTER — Ambulatory Visit: Payer: Medicare Other

## 2019-06-11 ENCOUNTER — Ambulatory Visit: Payer: Medicare Other | Attending: Internal Medicine

## 2019-06-11 DIAGNOSIS — Z23 Encounter for immunization: Secondary | ICD-10-CM | POA: Insufficient documentation

## 2019-06-11 NOTE — Progress Notes (Signed)
   Covid-19 Vaccination Clinic  Name:  David [name]    MRN: [id number] DOB: [birth year]  06/11/2019  [name] was observed post Covid-19 immunization for 15 minutes without incidence. He was provided with Vaccine Information Sheet and instruction to access the V-Safe system.   [name] was instructed to call 911 with any severe reactions post vaccine: Marland Kitchen Difficulty breathing  . Swelling of your face and throat  . A fast heartbeat  . A bad rash all over your body  . Dizziness and weakness    Immunizations Administered    Name Date Dose VIS Date Route   Pfizer COVID-19 Vaccine 06/11/2019 12:09 PM 0.3 mL 04/16/2019 Intramuscular   Manufacturer: [place]   Lot: [id number]   [place]: [id number]

## 2019-06-21 ENCOUNTER — Telehealth: Payer: Self-pay | Admitting: Internal Medicine

## 2019-06-21 ENCOUNTER — Other Ambulatory Visit: Payer: Self-pay | Admitting: Internal Medicine

## 2019-06-21 NOTE — Telephone Encounter (Signed)
    Patient requesting order for labs prior to 27mo f/u appt  Please advise

## 2019-06-21 NOTE — Telephone Encounter (Signed)
Sorry, I cannot as he has medicare as his primary insurance which will not pay  Sorry, I dont make the rules.Marland KitchenMarland KitchenMarland Kitchen

## 2019-06-21 NOTE — Telephone Encounter (Signed)
Mychart message sent to patient with response.

## 2019-06-24 ENCOUNTER — Ambulatory Visit: Payer: Medicare Other

## 2019-06-25 ENCOUNTER — Ambulatory Visit: Payer: Medicare Other | Admitting: Internal Medicine

## 2019-07-06 ENCOUNTER — Ambulatory Visit: Payer: Medicare Other | Attending: Internal Medicine

## 2019-07-06 DIAGNOSIS — Z23 Encounter for immunization: Secondary | ICD-10-CM | POA: Insufficient documentation

## 2019-07-06 NOTE — Progress Notes (Signed)
   Covid-19 Vaccination Clinic  Name:  David [name]    MRN: [id number] DOB: [birth year]  07/06/2019  [name] was observed post Covid-19 immunization for 15 minutes without incident. He was provided with Vaccine Information Sheet and instruction to access the V-Safe system.   [name] was instructed to call 911 with any severe reactions post vaccine: Marland Kitchen Difficulty breathing  . Swelling of face and throat  . A fast heartbeat  . A bad rash all over body  . Dizziness and weakness   Immunizations Administered    Name Date Dose VIS Date Route   Pfizer COVID-19 Vaccine 07/06/2019 11:50 AM 0.3 mL 04/16/2019 Intramuscular   Manufacturer: [place]   Lot: [id number]   [place]: [id number]

## 2019-07-13 ENCOUNTER — Other Ambulatory Visit: Payer: Self-pay

## 2019-07-13 ENCOUNTER — Ambulatory Visit (INDEPENDENT_AMBULATORY_CARE_PROVIDER_SITE_OTHER): Payer: Medicare Other | Admitting: Internal Medicine

## 2019-07-13 ENCOUNTER — Encounter: Payer: Self-pay | Admitting: Internal Medicine

## 2019-07-13 VITALS — BP 142/70 | HR 64 | Temp 97.6°F | Ht 70.0 in | Wt 276.0 lb

## 2019-07-13 DIAGNOSIS — E538 Deficiency of other specified B group vitamins: Secondary | ICD-10-CM | POA: Diagnosis not present

## 2019-07-13 DIAGNOSIS — F329 Major depressive disorder, single episode, unspecified: Secondary | ICD-10-CM

## 2019-07-13 DIAGNOSIS — E785 Hyperlipidemia, unspecified: Secondary | ICD-10-CM

## 2019-07-13 DIAGNOSIS — N320 Bladder-neck obstruction: Secondary | ICD-10-CM | POA: Diagnosis not present

## 2019-07-13 DIAGNOSIS — E559 Vitamin D deficiency, unspecified: Secondary | ICD-10-CM | POA: Diagnosis not present

## 2019-07-13 DIAGNOSIS — I10 Essential (primary) hypertension: Secondary | ICD-10-CM | POA: Diagnosis not present

## 2019-07-13 DIAGNOSIS — F32A Depression, unspecified: Secondary | ICD-10-CM

## 2019-07-13 DIAGNOSIS — E1121 Type 2 diabetes mellitus with diabetic nephropathy: Secondary | ICD-10-CM

## 2019-07-13 LAB — CBC WITH DIFFERENTIAL/PLATELET
Basophils Absolute: 0.1 10*3/uL (ref 0.0–0.1)
Basophils Relative: 1.6 % (ref 0.0–3.0)
Eosinophils Absolute: 0.6 10*3/uL (ref 0.0–0.7)
Eosinophils Relative: 8.9 % — ABNORMAL HIGH (ref 0.0–5.0)
HCT: 34.2 % — ABNORMAL LOW (ref 39.0–52.0)
Hemoglobin: 11.6 g/dL — ABNORMAL LOW (ref 13.0–17.0)
Lymphocytes Relative: 13.5 % (ref 12.0–46.0)
Lymphs Abs: 0.9 10*3/uL (ref 0.7–4.0)
MCHC: 33.9 g/dL (ref 30.0–36.0)
MCV: 96.8 fl (ref 78.0–100.0)
Monocytes Absolute: 0.7 10*3/uL (ref 0.1–1.0)
Monocytes Relative: 11.2 % (ref 3.0–12.0)
Neutro Abs: 4.3 10*3/uL (ref 1.4–7.7)
Neutrophils Relative %: 64.8 % (ref 43.0–77.0)
Platelets: 250 10*3/uL (ref 150.0–400.0)
RBC: 3.54 Mil/uL — ABNORMAL LOW (ref 4.22–5.81)
RDW: 13 % (ref 11.5–15.5)
WBC: 6.7 10*3/uL (ref 4.0–10.5)

## 2019-07-13 LAB — LIPID PANEL
Cholesterol: 128 mg/dL (ref 0–200)
HDL: 40.7 mg/dL (ref >39.00)
LDL Cholesterol: 52 mg/dL (ref 0–99)
NonHDL: 87.37
Total CHOL/HDL Ratio: 3
Triglycerides: 178 mg/dL — ABNORMAL HIGH (ref 0.0–149.0)
VLDL: 35.6 mg/dL (ref 0.0–40.0)

## 2019-07-13 LAB — VITAMIN D 25 HYDROXY (VIT D DEFICIENCY, FRACTURES): VITD: 30.91 ng/mL (ref 30.00–100.00)

## 2019-07-13 LAB — BASIC METABOLIC PANEL
BUN: 37 mg/dL — ABNORMAL HIGH (ref 6–23)
CO2: 27 mEq/L (ref 19–32)
Calcium: 10.1 mg/dL (ref 8.4–10.5)
Chloride: 101 mEq/L (ref 96–112)
Creatinine, Ser: 1.32 mg/dL (ref 0.40–1.50)
GFR: 53.5 mL/min — ABNORMAL LOW (ref >60.00)
Glucose, Bld: 138 mg/dL — ABNORMAL HIGH (ref 70–99)
Potassium: 4.2 mEq/L (ref 3.5–5.1)
Sodium: 137 mEq/L (ref 135–145)

## 2019-07-13 LAB — HEPATIC FUNCTION PANEL
ALT: 12 U/L (ref 0–53)
AST: 19 U/L (ref 0–37)
Albumin: 4.1 g/dL (ref 3.5–5.2)
Alkaline Phosphatase: 50 U/L (ref 39–117)
Bilirubin, Direct: 0.1 mg/dL (ref 0.0–0.3)
Total Bilirubin: 0.5 mg/dL (ref 0.2–1.2)
Total Protein: 7.7 g/dL (ref 6.0–8.3)

## 2019-07-13 LAB — PSA: PSA: 1.92 ng/mL (ref 0.10–4.00)

## 2019-07-13 LAB — HEMOGLOBIN A1C: Hgb A1c MFr Bld: 5.7 % (ref 4.6–6.5)

## 2019-07-13 LAB — VITAMIN B12: Vitamin B-12: 233 pg/mL (ref 211–911)

## 2019-07-13 LAB — TSH: TSH: 1.97 u[IU]/mL (ref 0.35–4.50)

## 2019-07-13 NOTE — Patient Instructions (Signed)

## 2019-07-13 NOTE — Progress Notes (Signed)
Subjective:    Patient ID: David [name], male    DOB: [birth year], 69 y.o.   MRN: [id number]  HPI  Here to f/u; overall doing ok,  Pt denies chest pain, increasing sob or doe, wheezing, orthopnea, PND, increased LE swelling, palpitations, dizziness or syncope.  Pt denies new neurological symptoms such as new headache, or facial or extremity weakness or numbness.  Pt denies polydipsia, polyuria, or low sugar episode.  Pt states overall good compliance with meds, mostly trying to follow appropriate diet, with wt overall stable,  but little exercise however. BP at home < 140;90  Denies worsening depressive symptoms, suicidal ideation, or panic Wt Readings from Last 3 Encounters:  07/13/19 276 lb (125.2 kg)  12/28/18 260 lb (117.9 kg)  12/23/18 269 lb (122 kg)    BP Readings from Last 3 Encounters:  07/13/19 (!) 142/70  12/28/18 122/76  12/23/18 122/68   Past Medical History:  Diagnosis Date  . AAA (abdominal aortic aneurysm) ([place])   . 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 Anticardiolipin antibody positive   . Anxiety   . ANXIETY 07/19/2008  . CEREBROVASCULAR ACCIDENT, HX OF 07/19/2008  . CVA (cerebral vascular accident) ([place]) 2003  . Depression   . DEPRESSION 07/19/2008  . Diabetes mellitus    type II  . DIABETES MELLITUS, TYPE II 01/18/2008  . Diverticulosis   . GERD 07/19/2008  . GERD (gastroesophageal reflux disease)   . Hemorrhoids   . Hx of adenomatous colonic polyps   . Hyperlipidemia   . HYPERLIPIDEMIA 07/19/2008  . Hypertension   . HYPERTENSION 07/31/2009  . Increased prostate specific antigen (PSA) velocity 09/13/2010  . Internal hemorrhoids   . Perineal abscess   . Peripheral vascular disease ([place])    peripheral vascular disease/carotid artery <100%  . PERSONAL HX COLONIC POLYPS 12/10/2007  . Unspecified Peripheral Vascular Disease 01/18/2008  . Varicose veins    Past Surgical History:  Procedure Laterality Date  . 7 abdominal hernia repairs   05/31/10  . ABDOMINAL AORTAGRAM N/A 01/12/2013   Procedure: ABDOMINAL Maxcine Ham;  Surgeon: Serafina Mitchell, MD;  Location: [facility] CATH LAB;  Service: Cardiovascular;  Laterality: N/A;  . ABDOMINAL AORTIC ANEURYSM REPAIR    . COLONOSCOPY    . hernia surgury     69 years old  . perineal abscess    . PERIPHERAL VASCULAR CATHETERIZATION N/A 11/23/2014   Procedure: Abdominal Aortogram;  Surgeon: Serafina Mitchell, MD;  Location: [place] CV LAB;  Service: Cardiovascular;  Laterality: N/A;  . PERIPHERAL VASCULAR CATHETERIZATION  11/23/2014   Procedure: Lower Extremity Angiography;  Surgeon: Serafina Mitchell, MD;  Location: [place] CV LAB;  Service: Cardiovascular;;  . PERIPHERAL VASCULAR CATHETERIZATION  11/23/2014   Procedure: Peripheral Vascular Intervention;  Surgeon: Serafina Mitchell, MD;  Location: [place] CV LAB;  Service: Cardiovascular;;  PTA bilateral common iliac PTA/DCB left fem-pop  . POLYPECTOMY    . 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 SP ENDO REPAIR INFRAREN AAA      reports that he quit smoking about 27 years ago. His smoking use included cigarettes. He has never used smokeless tobacco. He reports current alcohol use of about 2.0 standard drinks of alcohol per week. He reports that he does not use drugs. family history includes Aortic aneurysm in his mother; Cancer in his brother. Allergies  Allergen Reactions  . Latex Rash   Current Outpatient Medications on File Prior to Visit  Medication Sig Dispense Refill  . amLODipine (NORVASC) 5 MG tablet TAKE (2) TABLETS BY MOUTH DAILY. 60 tablet 0  . Ascorbic Acid (VITAMIN C CR) 500 MG CPCR Take 500 mg by mouth daily.     Marland Kitchen atorvastatin (LIPITOR) 40 MG tablet Take 1 tablet (40 mg total) by mouth daily. Keep follow-up appt for future refills 30 tablet 0  . cetirizine (ZYRTEC) 10 MG tablet Take 1 tablet (10 mg total) by mouth daily. 90 tablet 3  . clopidogrel (PLAVIX) 75 MG tablet Take 1 tablet (75 mg total) by mouth daily.  Keep follow-up appt for future refills 30 tablet 0  . docusate sodium (COLACE) 100 MG capsule Take 100 mg by mouth 2 (two) times daily.     Marland Kitchen ELIQUIS 5 MG TABS tablet TAKE 1 TABLET BY MOUTH TWICE DAILY. 180 tablet 1  . famotidine (PEPCID) 20 MG tablet Take 20 mg by mouth 2 (two) times daily.    . Fiber CAPS Take 5.7 mg by mouth See admin instructions. Take (10) 0.57 capsules by mouth daily for regularity.    . folic acid (FOLVITE) 1 MG tablet Take 1 tablet (1 mg total) by mouth daily. 90 tablet 3  . furosemide (LASIX) 40 MG tablet Take 40 mg by mouth daily.    Marland Kitchen gemfibrozil (LOPID) 600 MG tablet Take 2 tablets (1,200 mg total) by mouth daily. Keep follow-up appt for future refills 60 tablet 0  . metFORMIN (GLUCOPHAGE-XR) 500 MG 24 hr tablet TAKE 4 TABLETS EVERY AM. 360 tablet 1  . metroNIDAZOLE (METROGEL) 0.75 % gel Apply 1 application topically daily.     . Multiple Vitamin (MULTIVITAMIN) capsule Take 1 capsule by mouth daily.     Marland Kitchen olmesartan (BENICAR) 40 MG tablet Take 1 tablet (40 mg total) by mouth daily. Follow-up appt due in August must see provider for future refills 90 tablet 0  . triamcinolone (NASACORT) 55 MCG/ACT AERO nasal inhaler Place 2 sprays into the nose daily. (Patient taking differently: Place 1 spray into the nose at bedtime. ) 1 Inhaler 12  . valACYclovir (VALTREX) 1000 MG tablet Take 1 tablet (1,000 mg total) by mouth 3 (three) times daily. 21 tablet 0  . albuterol (PROVENTIL HFA;VENTOLIN HFA) 108 (90 Base) MCG/ACT inhaler Inhale 2 puffs into the lungs every 6 (six) hours as needed for wheezing or shortness of breath. (Patient not taking: Reported on 07/13/2019) 1 Inhaler 1  . gabapentin (NEURONTIN) 100 MG capsule Take 1 capsule (100 mg total) by mouth 3 (three) times daily. (Patient not taking: Reported on 07/13/2019) 90 capsule 3  . lidocaine (LIDODERM) 5 % Place 1 patch onto the skin daily. Remove & Discard patch within 12 hours or as directed by MD (Patient not taking: Reported  on 07/13/2019) 30 patch 0  . olmesartan-hydrochlorothiazide (BENICAR HCT) 40-25 MG tablet Take 1 tablet by mouth daily. (Patient not taking: Reported on 07/13/2019) 90 tablet 3  . pioglitazone (ACTOS) 30 MG tablet TAKE 1 TABLET EACH DAY. 90 tablet 0   No current facility-administered medications on file prior to visit.   Review of Systems All otherwise neg per pt     Objective:   Physical Exam BP (!) 142/70   Pulse 64   Temp 97.6 F (36.4 C)   Ht 5\' 10"  (1.778 m)   Wt 276 lb (125.2 kg)   SpO2 100%   BMI 39.60 kg/m  VS noted,  Constitutional: Pt appears in NAD HENT: Head: NCAT.  Right Ear: External ear  normal.  Left Ear: External ear normal.  Eyes: . Pupils are equal, round, and reactive to light. Conjunctivae and EOM are normal Nose: without d/c or deformity Neck: Neck supple. Gross normal ROM Cardiovascular: Normal rate and regular rhythm.   Pulmonary/Chest: Effort normal and breath sounds without rales or wheezing.  Abd:  Soft, NT, ND, + BS, no organomegaly Neurological: Pt is alert. At baseline orientation, motor grossly intact Skin: Skin is warm. No rashes, other new lesions, no LE edema Psychiatric: Pt behavior is normal without agitation  All otherwise neg per pt Lab Results  Component Value Date   WBC 6.7 07/13/2019   HGB 11.6 (L) 07/13/2019   HCT 34.2 (L) 07/13/2019   PLT 250.0 07/13/2019   GLUCOSE 138 (H) 07/13/2019   CHOL 128 07/13/2019   TRIG 178.0 (H) 07/13/2019   HDL 40.70 07/13/2019   LDLDIRECT 56.0 12/22/2018   LDLCALC 52 07/13/2019   ALT 12 07/13/2019   AST 19 07/13/2019   NA 137 07/13/2019   K 4.2 07/13/2019   CL 101 07/13/2019   CREATININE 1.32 07/13/2019   BUN 37 (H) 07/13/2019   CO2 27 07/13/2019   TSH 1.97 07/13/2019   PSA 1.92 07/13/2019   INR 1.17 10/16/2017   HGBA1C 5.7 07/13/2019   MICROALBUR 173.4 (H) 11/12/2016      Assessment & Plan:

## 2019-07-13 NOTE — Assessment & Plan Note (Signed)
stable overall by history and exam, recent data reviewed with pt, and pt to continue medical treatment as before,  to f/u any worsening symptoms or concerns  

## 2019-07-13 NOTE — Assessment & Plan Note (Addendum)
stable overall by history and exam, recent data reviewed with pt, and pt to continue medical treatment as before,  to f/u any worsening symptoms or concerns  I spent 30 minutes in preparing to see the patient by review of recent labs, imaging and procedures, obtaining and reviewing separately obtained history, communicating with the patient and family or caregiver, ordering medications, tests or procedures, and documenting clinical information in the EHR including the differential Dx, treatment, and any further evaluation and other management of HLD, HTN, DM, depression

## 2019-07-25 ENCOUNTER — Other Ambulatory Visit: Payer: Self-pay | Admitting: Internal Medicine

## 2019-07-25 NOTE — Telephone Encounter (Signed)
Please refill as per office routine med refill policy (all routine meds refilled for 3 mo or monthly per pt preference up to one year from last visit, then month to month grace period for 3 mo, then further med refills will have to be denied)  

## 2019-08-27 DIAGNOSIS — E669 Obesity, unspecified: Secondary | ICD-10-CM | POA: Diagnosis not present

## 2019-08-27 DIAGNOSIS — E559 Vitamin D deficiency, unspecified: Secondary | ICD-10-CM | POA: Diagnosis not present

## 2019-08-27 DIAGNOSIS — R809 Proteinuria, unspecified: Secondary | ICD-10-CM | POA: Diagnosis not present

## 2019-08-27 DIAGNOSIS — R609 Edema, unspecified: Secondary | ICD-10-CM | POA: Diagnosis not present

## 2019-08-27 DIAGNOSIS — N1831 Chronic kidney disease, stage 3a: Secondary | ICD-10-CM | POA: Diagnosis not present

## 2019-08-27 DIAGNOSIS — I129 Hypertensive chronic kidney disease with stage 1 through stage 4 chronic kidney disease, or unspecified chronic kidney disease: Secondary | ICD-10-CM | POA: Diagnosis not present

## 2019-09-09 ENCOUNTER — Other Ambulatory Visit: Payer: Self-pay | Admitting: *Deleted

## 2019-09-09 DIAGNOSIS — Z9889 Other specified postprocedural states: Secondary | ICD-10-CM

## 2019-09-09 DIAGNOSIS — I6529 Occlusion and stenosis of unspecified carotid artery: Secondary | ICD-10-CM

## 2019-09-09 DIAGNOSIS — I739 Peripheral vascular disease, unspecified: Secondary | ICD-10-CM

## 2019-09-09 DIAGNOSIS — I779 Disorder of arteries and arterioles, unspecified: Secondary | ICD-10-CM

## 2019-09-10 ENCOUNTER — Telehealth (HOSPITAL_COMMUNITY): Payer: Self-pay

## 2019-09-10 NOTE — Telephone Encounter (Signed)

## 2019-09-12 ENCOUNTER — Other Ambulatory Visit: Payer: Self-pay | Admitting: Internal Medicine

## 2019-09-12 NOTE — Telephone Encounter (Signed)
Please refill as per office routine med refill policy (all routine meds refilled for 3 mo or monthly per pt preference up to one year from last visit, then month to month grace period for 3 mo, then further med refills will have to be denied)  

## 2019-09-13 ENCOUNTER — Encounter: Payer: Self-pay | Admitting: Surgery

## 2019-09-13 ENCOUNTER — Other Ambulatory Visit: Payer: Self-pay

## 2019-09-13 ENCOUNTER — Ambulatory Visit (INDEPENDENT_AMBULATORY_CARE_PROVIDER_SITE_OTHER)
Admission: RE | Admit: 2019-09-13 | Discharge: 2019-09-13 | Disposition: A | Discharge status: Home / Self Care | Payer: Medicare Other | Source: Ambulatory Visit | Attending: Surgery | Admitting: Surgery

## 2019-09-13 ENCOUNTER — Ambulatory Visit (HOSPITAL_COMMUNITY)
Admission: RE | Admit: 2019-09-13 | Discharge: 2019-09-13 | Disposition: A | Discharge status: Home / Self Care | Payer: Medicare Other | Source: Ambulatory Visit | Attending: Surgery | Admitting: Surgery

## 2019-09-13 ENCOUNTER — Ambulatory Visit (INDEPENDENT_AMBULATORY_CARE_PROVIDER_SITE_OTHER): Payer: Medicare Other | Admitting: Surgery

## 2019-09-13 VITALS — BP 164/80 | HR 53 | Temp 97.7°F | Resp 20 | Ht 70.0 in | Wt 271.0 lb

## 2019-09-13 DIAGNOSIS — I6523 Occlusion and stenosis of bilateral carotid arteries: Secondary | ICD-10-CM | POA: Diagnosis not present

## 2019-09-13 DIAGNOSIS — I6529 Occlusion and stenosis of unspecified carotid artery: Secondary | ICD-10-CM | POA: Diagnosis not present

## 2019-09-13 DIAGNOSIS — I714 Abdominal aortic aneurysm, without rupture, unspecified: Secondary | ICD-10-CM

## 2019-09-13 DIAGNOSIS — I739 Peripheral vascular disease, unspecified: Secondary | ICD-10-CM

## 2019-09-13 DIAGNOSIS — I70213 Atherosclerosis of native arteries of extremities with intermittent claudication, bilateral legs: Secondary | ICD-10-CM

## 2019-09-13 DIAGNOSIS — Z9889 Other specified postprocedural states: Secondary | ICD-10-CM | POA: Insufficient documentation

## 2019-09-13 DIAGNOSIS — I779 Disorder of arteries and arterioles, unspecified: Secondary | ICD-10-CM

## 2019-09-13 NOTE — Progress Notes (Signed)
Vascular and Vein Specialist of [place]  Patient name: David [name] MRN: [id number] DOB: November 14, 1950 Sex: male   REASON FOR VISIT:    Follow up  HISOTRY OF PRESENT ILLNESS:    The patient comes back today for followup. He has undergone open abdominal aortic aneurysm repair by Dr. Amedeo Plenty in 2012. 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 He had his left popliteal aneurysm repaired by above-knee to below-knee bypass in October of 2010 he's had a midline abdominal hernia repair by Dr. gross. He had elevated velocities in the proximal anastomosis of his bypass graft and underwent arteriogram which revealed a high-grade stenosis extending into his bypass graft balloon angioplasty was performed however this resulted in a dissection and he therefore underwent stenting using a 6 mm stent. He was found to have in-stent stenosis on the left. On 01/12/2013 he underwent angiography with a successful balloon angioplasty of the stent using a 6 mm balloon. Therefore on 11/23/2014 he underwent drug coated balloon angioplasty of the left proximal anastomosis which has previously been stented. He also underwent balloon angioplasty of a left common iliac stenosis and angioplasty of the right common iliac stenosis.  He remains in atrial fibrillation which is treated with Eliquis. He is also on Plavix. He has no symptoms of claudication. He has had no neurologic symptoms.   PAST MEDICAL HISTORY:   Past Medical History:  Diagnosis Date  . AAA (abdominal aortic aneurysm) ([place])   . 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 Anticardiolipin antibody positive   . Anxiety   . ANXIETY 07/19/2008  . CEREBROVASCULAR ACCIDENT, HX OF 07/19/2008  . CVA (cerebral vascular accident) ([place]) 2003  . Depression   . DEPRESSION 07/19/2008  . Diabetes mellitus    type II  . DIABETES MELLITUS, TYPE II 01/18/2008  . Diverticulosis   . GERD  07/19/2008  . GERD (gastroesophageal reflux disease)   . Hemorrhoids   . Hx of adenomatous colonic polyps   . Hyperlipidemia   . HYPERLIPIDEMIA 07/19/2008  . Hypertension   . HYPERTENSION 07/31/2009  . Increased prostate specific antigen (PSA) velocity 09/13/2010  . Internal hemorrhoids   . Perineal abscess   . Peripheral vascular disease ([place])    peripheral vascular disease/carotid artery <100%  . PERSONAL HX COLONIC POLYPS 12/10/2007  . Unspecified Peripheral Vascular Disease 01/18/2008  . Varicose veins      FAMILY HISTORY:   Family History  Problem Relation Age of Onset  . Aortic aneurysm Mother        desceding aoritic aneurysm  . Cancer Brother        Lung cancer  . Colon cancer Neg Hx   . Esophageal cancer Neg Hx   . Stomach cancer Neg Hx   . Rectal cancer Neg Hx     SOCIAL HISTORY:   Social History   Tobacco Use  . Smoking status: Former Smoker    Types: Cigarettes    Quit date: 01/14/1992    Years since quitting: 27.6  . Smokeless tobacco: Never Used  Substance Use Topics  . Alcohol use: Yes    Alcohol/week: 2.0 standard drinks    Types: 2 Glasses of wine per week    Comment: wine daily     ALLERGIES:   Allergies  Allergen Reactions  . Latex Rash     CURRENT MEDICATIONS:   Current Outpatient Medications  Medication Sig Dispense Refill  . amLODipine (NORVASC) 5 MG tablet TAKE (2) TABLETS  BY MOUTH DAILY. 60 tablet 0  . Ascorbic Acid (VITAMIN C CR) 500 MG CPCR Take 500 mg by mouth daily.     Marland Kitchen atorvastatin (LIPITOR) 40 MG tablet TAKE 1 TABLET ONCE DAILY. 90 tablet 3  . cetirizine (ZYRTEC) 10 MG tablet Take 1 tablet (10 mg total) by mouth daily. 90 tablet 3  . Cholecalciferol (VITAMIN D) 50 MCG (2000 UT) tablet Take 2,000 Units by mouth daily.    . clopidogrel (PLAVIX) 75 MG tablet TAKE 1 TABLET EACH DAY. 90 tablet 3  . docusate sodium (COLACE) 100 MG capsule Take 100 mg by mouth 2 (two) times daily.     Marland Kitchen ELIQUIS 5 MG TABS tablet TAKE 1 TABLET BY  MOUTH TWICE DAILY. 180 tablet 1  . famotidine (PEPCID) 20 MG tablet Take 20 mg by mouth 2 (two) times daily.    . Fiber CAPS Take 5.7 mg by mouth See admin instructions. Take (10) 0.57 capsules by mouth daily for regularity.    . folic acid (FOLVITE) 1 MG tablet Take 1 tablet (1 mg total) by mouth daily. 90 tablet 3  . furosemide (LASIX) 40 MG tablet Take 40 mg by mouth daily.    Marland Kitchen gemfibrozil (LOPID) 600 MG tablet TAKE 2 TABLETS BY MOUTH ONCE DAILY. 180 tablet 3  . metFORMIN (GLUCOPHAGE-XR) 500 MG 24 hr tablet TAKE 4 TABLETS EVERY AM. 360 tablet 1  . metroNIDAZOLE (METROGEL) 0.75 % gel Apply 1 application topically daily.     . Multiple Vitamin (MULTIVITAMIN) capsule Take 1 capsule by mouth daily.     Marland Kitchen olmesartan (BENICAR) 40 MG tablet Take 1 tablet (40 mg total) by mouth daily. Follow-up appt due in August must see provider for future refills 90 tablet 0  . pioglitazone (ACTOS) 30 MG tablet TAKE 1 TABLET EACH DAY. 90 tablet 0  . triamcinolone (NASACORT) 55 MCG/ACT AERO nasal inhaler Place 2 sprays into the nose daily. (Patient taking differently: Place 1 spray into the nose at bedtime. ) 1 Inhaler 12  . valACYclovir (VALTREX) 1000 MG tablet Take 1 tablet (1,000 mg total) by mouth 3 (three) times daily. 21 tablet 0  . albuterol (PROVENTIL HFA;VENTOLIN HFA) 108 (90 Base) MCG/ACT inhaler Inhale 2 puffs into the lungs every 6 (six) hours as needed for wheezing or shortness of breath. (Patient not taking: Reported on 07/13/2019) 1 Inhaler 1   No current facility-administered medications for this visit.    REVIEW OF SYSTEMS:   [X]  denotes positive finding, [ ]  denotes negative finding Cardiac  Comments:  Chest pain or chest pressure:    Shortness of breath upon exertion:    Short of breath when lying flat:    Irregular heart rhythm:        Vascular    Pain in calf, thigh, or hip brought on by ambulation:    Pain in feet at night that wakes you up from your sleep:     Blood clot in your  veins:    Leg swelling:         Pulmonary    Oxygen at home:    Productive cough:     Wheezing:         Neurologic    Sudden weakness in arms or legs:     Sudden numbness in arms or legs:     Sudden onset of difficulty speaking or slurred speech:    Temporary loss of vision in one eye:     Problems with dizziness:  Gastrointestinal    Blood in stool:     Vomited blood:         Genitourinary    Burning when urinating:     Blood in urine:        Psychiatric    Major depression:         Hematologic    Bleeding problems:    Problems with blood clotting too easily:        Skin    Rashes or ulcers:        Constitutional    Fever or chills:      PHYSICAL EXAM:   Vitals:   09/13/19 1052 09/13/19 1054  BP: (!) 149/80 (!) 164/80  Pulse: (!) 53   Resp: 20   Temp: 97.7 F (36.5 C)   SpO2: 97%   Weight: 271 lb (122.9 kg)   Height: 5\' 10"  (1.778 m)     GENERAL: The patient is a well-nourished male, in no acute distress. The vital signs are documented above. CARDIAC: There is a regular rate and rhythm.  PULMONARY: Non-labored respirations MUSCULOSKELETAL: There are no major deformities or cyanosis. NEUROLOGIC: No focal weakness or paresthesias are detected. SKIN: There are no ulcers or rashes noted. PSYCHIATRIC: The patient has a normal affect.  STUDIES:   I have reviewed the following: ABI/TBIToday's ABIToday's TBIPrevious ABIPrevious TBI  +-------+-----------+-----------+------------+------------+  Right 0.75    0.60    0.87    0.57      +-------+-----------+-----------+------------+------------+  Left  0.70    0.40    0.96    0.59      +-------+-----------+-----------+------------+------------+  Right: Patent right popliteal stent with monophasic waveforms observed  throughout.  Patent and diffusely diseased superficial femoral artery with velocities  suggestive of a 50-74% stenosis in the proximal and  mid segments.  Visualization is sub-optimal due to vessel depth and calcified plaque with  acoustic shadowing.   Left: Technically difficult exam and limited visualization of the above  knee to below knee popliteal bypass graft due to vessel depth. The  proximal anastomosis velocities suggest a >70% stenosis.   CAROTID: Right Carotid: Velocities in the right ICA are consistent with a 40-59%         stenosis.         Velocities may be overestimated due to compensatory flow.   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   Vertebrals: Left vertebral artery demonstrates antegrade flow. Right  vertebral        artery demonstrates high resistant flow.  Subclavians: Normal flow hemodynamics were seen in bilateral subclavian        arteries.  AORTA: Abdominal Aorta: No evidence of an abdominal aortic aneurysm was  visualized.  MEDICAL ISSUES:   Carotid: He will need an ultrasound in 1 year  Lower extremity: There has been a drop in the ABIs bilaterally.  Velocities are low within the stents on the right and he appears to have a stenosis at the origin of his left bypass graft.  I would like to treat these lesions simultaneously given his Eliquis utilization.  Therefore I have proposed coming from the left arm and intervening bilaterally.  He is in agreement with this plan.  He would like for this to be done in June.    Leia Alf, MD, FACS [facility] Specialists of [facility] [phone] Pager [phone]

## 2019-09-13 NOTE — H&P (View-Only) (Signed)
Vascular and Vein Specialist of [place]  Patient name: David [name] MRN: [id number] DOB: 28-Jul-1950 Sex: male   REASON FOR VISIT:    Follow up  HISOTRY OF PRESENT ILLNESS:    The patient comes back today for followup. He has undergone open abdominal aortic aneurysm repair by Dr. Amedeo Plenty in 2012. 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 He had his left popliteal aneurysm repaired by above-knee to below-knee bypass in October of 2010 he's had a midline abdominal hernia repair by Dr. gross. He had elevated velocities in the proximal anastomosis of his bypass graft and underwent arteriogram which revealed a high-grade stenosis extending into his bypass graft balloon angioplasty was performed however this resulted in a dissection and he therefore underwent stenting using a 6 mm stent. He was found to have in-stent stenosis on the left. On 01/12/2013 he underwent angiography with a successful balloon angioplasty of the stent using a 6 mm balloon. Therefore on 11/23/2014 he underwent drug coated balloon angioplasty of the left proximal anastomosis which has previously been stented. He also underwent balloon angioplasty of a left common iliac stenosis and angioplasty of the right common iliac stenosis.  He remains in atrial fibrillation which is treated with Eliquis. He is also on Plavix. He has no symptoms of claudication. He has had no neurologic symptoms.   PAST MEDICAL HISTORY:   Past Medical History:  Diagnosis Date  . AAA (abdominal aortic aneurysm) ([place])   . 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 Anticardiolipin antibody positive   . Anxiety   . ANXIETY 07/19/2008  . CEREBROVASCULAR ACCIDENT, HX OF 07/19/2008  . CVA (cerebral vascular accident) ([place]) 2003  . Depression   . DEPRESSION 07/19/2008  . Diabetes mellitus    type II  . DIABETES MELLITUS, TYPE II 01/18/2008  . Diverticulosis   . GERD  07/19/2008  . GERD (gastroesophageal reflux disease)   . Hemorrhoids   . Hx of adenomatous colonic polyps   . Hyperlipidemia   . HYPERLIPIDEMIA 07/19/2008  . Hypertension   . HYPERTENSION 07/31/2009  . Increased prostate specific antigen (PSA) velocity 09/13/2010  . Internal hemorrhoids   . Perineal abscess   . Peripheral vascular disease ([place])    peripheral vascular disease/carotid artery <100%  . PERSONAL HX COLONIC POLYPS 12/10/2007  . Unspecified Peripheral Vascular Disease 01/18/2008  . Varicose veins      FAMILY HISTORY:   Family History  Problem Relation Age of Onset  . Aortic aneurysm Mother        desceding aoritic aneurysm  . Cancer Brother        Lung cancer  . Colon cancer Neg Hx   . Esophageal cancer Neg Hx   . Stomach cancer Neg Hx   . Rectal cancer Neg Hx     SOCIAL HISTORY:   Social History   Tobacco Use  . Smoking status: Former Smoker    Types: Cigarettes    Quit date: 01/14/1992    Years since quitting: 27.6  . Smokeless tobacco: Never Used  Substance Use Topics  . Alcohol use: Yes    Alcohol/week: 2.0 standard drinks    Types: 2 Glasses of wine per week    Comment: wine daily     ALLERGIES:   Allergies  Allergen Reactions  . Latex Rash     CURRENT MEDICATIONS:   Current Outpatient Medications  Medication Sig Dispense Refill  . amLODipine (NORVASC) 5 MG tablet TAKE (2) TABLETS  BY MOUTH DAILY. 60 tablet 0  . Ascorbic Acid (VITAMIN C CR) 500 MG CPCR Take 500 mg by mouth daily.     Marland Kitchen atorvastatin (LIPITOR) 40 MG tablet TAKE 1 TABLET ONCE DAILY. 90 tablet 3  . cetirizine (ZYRTEC) 10 MG tablet Take 1 tablet (10 mg total) by mouth daily. 90 tablet 3  . Cholecalciferol (VITAMIN D) 50 MCG (2000 UT) tablet Take 2,000 Units by mouth daily.    . clopidogrel (PLAVIX) 75 MG tablet TAKE 1 TABLET EACH DAY. 90 tablet 3  . docusate sodium (COLACE) 100 MG capsule Take 100 mg by mouth 2 (two) times daily.     Marland Kitchen ELIQUIS 5 MG TABS tablet TAKE 1 TABLET BY  MOUTH TWICE DAILY. 180 tablet 1  . famotidine (PEPCID) 20 MG tablet Take 20 mg by mouth 2 (two) times daily.    . Fiber CAPS Take 5.7 mg by mouth See admin instructions. Take (10) 0.57 capsules by mouth daily for regularity.    . folic acid (FOLVITE) 1 MG tablet Take 1 tablet (1 mg total) by mouth daily. 90 tablet 3  . furosemide (LASIX) 40 MG tablet Take 40 mg by mouth daily.    Marland Kitchen gemfibrozil (LOPID) 600 MG tablet TAKE 2 TABLETS BY MOUTH ONCE DAILY. 180 tablet 3  . metFORMIN (GLUCOPHAGE-XR) 500 MG 24 hr tablet TAKE 4 TABLETS EVERY AM. 360 tablet 1  . metroNIDAZOLE (METROGEL) 0.75 % gel Apply 1 application topically daily.     . Multiple Vitamin (MULTIVITAMIN) capsule Take 1 capsule by mouth daily.     Marland Kitchen olmesartan (BENICAR) 40 MG tablet Take 1 tablet (40 mg total) by mouth daily. Follow-up appt due in August must see provider for future refills 90 tablet 0  . pioglitazone (ACTOS) 30 MG tablet TAKE 1 TABLET EACH DAY. 90 tablet 0  . triamcinolone (NASACORT) 55 MCG/ACT AERO nasal inhaler Place 2 sprays into the nose daily. (Patient taking differently: Place 1 spray into the nose at bedtime. ) 1 Inhaler 12  . valACYclovir (VALTREX) 1000 MG tablet Take 1 tablet (1,000 mg total) by mouth 3 (three) times daily. 21 tablet 0  . albuterol (PROVENTIL HFA;VENTOLIN HFA) 108 (90 Base) MCG/ACT inhaler Inhale 2 puffs into the lungs every 6 (six) hours as needed for wheezing or shortness of breath. (Patient not taking: Reported on 07/13/2019) 1 Inhaler 1   No current facility-administered medications for this visit.    REVIEW OF SYSTEMS:   [X]  denotes positive finding, [ ]  denotes negative finding Cardiac  Comments:  Chest pain or chest pressure:    Shortness of breath upon exertion:    Short of breath when lying flat:    Irregular heart rhythm:        Vascular    Pain in calf, thigh, or hip brought on by ambulation:    Pain in feet at night that wakes you up from your sleep:     Blood clot in your  veins:    Leg swelling:         Pulmonary    Oxygen at home:    Productive cough:     Wheezing:         Neurologic    Sudden weakness in arms or legs:     Sudden numbness in arms or legs:     Sudden onset of difficulty speaking or slurred speech:    Temporary loss of vision in one eye:     Problems with dizziness:  Gastrointestinal    Blood in stool:     Vomited blood:         Genitourinary    Burning when urinating:     Blood in urine:        Psychiatric    Major depression:         Hematologic    Bleeding problems:    Problems with blood clotting too easily:        Skin    Rashes or ulcers:        Constitutional    Fever or chills:      PHYSICAL EXAM:   Vitals:   09/13/19 1052 09/13/19 1054  BP: (!) 149/80 (!) 164/80  Pulse: (!) 53   Resp: 20   Temp: 97.7 F (36.5 C)   SpO2: 97%   Weight: 271 lb (122.9 kg)   Height: 5\' 10"  (1.778 m)     GENERAL: The patient is a well-nourished male, in no acute distress. The vital signs are documented above. CARDIAC: There is a regular rate and rhythm.  PULMONARY: Non-labored respirations MUSCULOSKELETAL: There are no major deformities or cyanosis. NEUROLOGIC: No focal weakness or paresthesias are detected. SKIN: There are no ulcers or rashes noted. PSYCHIATRIC: The patient has a normal affect.  STUDIES:   I have reviewed the following: ABI/TBIToday's ABIToday's TBIPrevious ABIPrevious TBI  +-------+-----------+-----------+------------+------------+  Right 0.75    0.60    0.87    0.57      +-------+-----------+-----------+------------+------------+  Left  0.70    0.40    0.96    0.59      +-------+-----------+-----------+------------+------------+  Right: Patent right popliteal stent with monophasic waveforms observed  throughout.  Patent and diffusely diseased superficial femoral artery with velocities  suggestive of a 50-74% stenosis in the proximal and  mid segments.  Visualization is sub-optimal due to vessel depth and calcified plaque with  acoustic shadowing.   Left: Technically difficult exam and limited visualization of the above  knee to below knee popliteal bypass graft due to vessel depth. The  proximal anastomosis velocities suggest a >70% stenosis.   CAROTID: Right Carotid: Velocities in the right ICA are consistent with a 40-59%         stenosis.         Velocities may be overestimated due to compensatory flow.   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   Vertebrals: Left vertebral artery demonstrates antegrade flow. Right  vertebral        artery demonstrates high resistant flow.  Subclavians: Normal flow hemodynamics were seen in bilateral subclavian        arteries.  AORTA: Abdominal Aorta: No evidence of an abdominal aortic aneurysm was  visualized.  MEDICAL ISSUES:   Carotid: He will need an ultrasound in 1 year  Lower extremity: There has been a drop in the ABIs bilaterally.  Velocities are low within the stents on the right and he appears to have a stenosis at the origin of his left bypass graft.  I would like to treat these lesions simultaneously given his Eliquis utilization.  Therefore I have proposed coming from the left arm and intervening bilaterally.  He is in agreement with this plan.  He would like for this to be done in June.    Leia Alf, MD, FACS [facility] of [facility] [phone] Pager [phone]

## 2019-10-01 ENCOUNTER — Other Ambulatory Visit (HOSPITAL_COMMUNITY)
Admission: RE | Admit: 2019-10-01 | Discharge: 2019-10-01 | Disposition: A | Discharge status: Home / Self Care | Payer: Medicare Other | Source: Ambulatory Visit | Attending: Surgery | Admitting: Surgery

## 2019-10-01 DIAGNOSIS — Z01812 Encounter for preprocedural laboratory examination: Secondary | ICD-10-CM | POA: Insufficient documentation

## 2019-10-01 DIAGNOSIS — Z20822 Contact with and (suspected) exposure to covid-19: Secondary | ICD-10-CM | POA: Diagnosis not present

## 2019-10-01 LAB — SARS CORONAVIRUS 2 (TAT 6-24 HRS): SARS Coronavirus 2: NEGATIVE

## 2019-10-05 ENCOUNTER — Ambulatory Visit (HOSPITAL_COMMUNITY)
Admission: RE | Admit: 2019-10-05 | Discharge: 2019-10-05 | Disposition: A | Discharge status: Home / Self Care | Payer: Medicare Other | Attending: Surgery | Admitting: Surgery

## 2019-10-05 ENCOUNTER — Encounter (HOSPITAL_COMMUNITY)
Admission: RE | Disposition: A | Discharge status: Home / Self Care | Payer: Self-pay | Source: Home / Self Care | Attending: Surgery

## 2019-10-05 ENCOUNTER — Other Ambulatory Visit: Payer: Self-pay

## 2019-10-05 DIAGNOSIS — E785 Hyperlipidemia, unspecified: Secondary | ICD-10-CM | POA: Insufficient documentation

## 2019-10-05 DIAGNOSIS — Z87891 Personal history of nicotine dependence: Secondary | ICD-10-CM | POA: Diagnosis not present

## 2019-10-05 DIAGNOSIS — I10 Essential (primary) hypertension: Secondary | ICD-10-CM | POA: Diagnosis not present

## 2019-10-05 DIAGNOSIS — Z7902 Long term (current) use of antithrombotics/antiplatelets: Secondary | ICD-10-CM | POA: Insufficient documentation

## 2019-10-05 DIAGNOSIS — E1151 Type 2 diabetes mellitus with diabetic peripheral angiopathy without gangrene: Secondary | ICD-10-CM | POA: Insufficient documentation

## 2019-10-05 DIAGNOSIS — Z7984 Long term (current) use of oral hypoglycemic drugs: Secondary | ICD-10-CM | POA: Diagnosis not present

## 2019-10-05 DIAGNOSIS — I70213 Atherosclerosis of native arteries of extremities with intermittent claudication, bilateral legs: Secondary | ICD-10-CM | POA: Diagnosis not present

## 2019-10-05 DIAGNOSIS — Z7901 Long term (current) use of anticoagulants: Secondary | ICD-10-CM | POA: Insufficient documentation

## 2019-10-05 DIAGNOSIS — K219 Gastro-esophageal reflux disease without esophagitis: Secondary | ICD-10-CM | POA: Diagnosis not present

## 2019-10-05 DIAGNOSIS — I739 Peripheral vascular disease, unspecified: Secondary | ICD-10-CM | POA: Diagnosis not present

## 2019-10-05 DIAGNOSIS — Z79899 Other long term (current) drug therapy: Secondary | ICD-10-CM | POA: Diagnosis not present

## 2019-10-05 DIAGNOSIS — Z8673 Personal history of transient ischemic attack (TIA), and cerebral infarction without residual deficits: Secondary | ICD-10-CM | POA: Insufficient documentation

## 2019-10-05 HISTORY — PX: ABDOMINAL AORTOGRAM W/LOWER EXTREMITY: CATH118223

## 2019-10-05 LAB — POCT ACTIVATED CLOTTING TIME: Activated Clotting Time: 153 seconds

## 2019-10-05 LAB — POCT I-STAT, CHEM 8
BUN: 34 mg/dL — ABNORMAL HIGH (ref 8–23)
Calcium, Ion: 1.26 mmol/L (ref 1.15–1.40)
Chloride: 106 mmol/L (ref 98–111)
Creatinine, Ser: 1.3 mg/dL — ABNORMAL HIGH (ref 0.61–1.24)
Glucose, Bld: 120 mg/dL — ABNORMAL HIGH (ref 70–99)
HCT: 35 % — ABNORMAL LOW (ref 39.0–52.0)
Hemoglobin: 11.9 g/dL — ABNORMAL LOW (ref 13.0–17.0)
Potassium: 4.1 mmol/L (ref 3.5–5.1)
Sodium: 141 mmol/L (ref 135–145)
TCO2: 22 mmol/L (ref 22–32)

## 2019-10-05 LAB — GLUCOSE, CAPILLARY: Glucose-Capillary: 126 mg/dL — ABNORMAL HIGH (ref 70–99)

## 2019-10-05 SURGERY — ABDOMINAL AORTOGRAM W/LOWER EXTREMITY
Anesthesia: LOCAL | Laterality: Bilateral

## 2019-10-05 MED ORDER — HYDRALAZINE HCL 20 MG/ML IJ SOLN
INTRAMUSCULAR | Status: AC
  Filled 2019-10-05: qty 1

## 2019-10-05 MED ORDER — MIDAZOLAM HCL 2 MG/2ML IJ SOLN
INTRAMUSCULAR | Status: AC
  Filled 2019-10-05: qty 2

## 2019-10-05 MED ORDER — LIDOCAINE HCL (PF) 1 % IJ SOLN
INTRAMUSCULAR | Status: AC
  Filled 2019-10-05: qty 30

## 2019-10-05 MED ORDER — NITROGLYCERIN 1 MG/10 ML FOR IR/CATH LAB
INTRA_ARTERIAL | Status: AC
  Filled 2019-10-05: qty 10

## 2019-10-05 MED ORDER — SODIUM CHLORIDE 0.9% FLUSH: 3.0000 mL | Freq: Two times a day (BID) | INTRAVENOUS | Status: DC

## 2019-10-05 MED ORDER — HEPARIN SODIUM (PORCINE) 1000 UNIT/ML IJ SOLN
INTRAMUSCULAR | Status: DC | PRN
  Administered 2019-10-05: 3000 [IU] via INTRAVENOUS
  Administered 2019-10-05: 2000 [IU] via INTRAVENOUS

## 2019-10-05 MED ORDER — SODIUM CHLORIDE 0.9 % IV SOLN: INTRAVENOUS | Status: DC

## 2019-10-05 MED ORDER — FENTANYL CITRATE (PF) 100 MCG/2ML IJ SOLN
INTRAMUSCULAR | Status: DC | PRN
  Administered 2019-10-05 (×2): 25 ug via INTRAVENOUS
  Administered 2019-10-05: 50 ug via INTRAVENOUS
  Administered 2019-10-05: 25 ug via INTRAVENOUS

## 2019-10-05 MED ORDER — ONDANSETRON HCL 4 MG/2ML IJ SOLN: 4.0000 mg | Freq: Four times a day (QID) | INTRAMUSCULAR | Status: DC | PRN

## 2019-10-05 MED ORDER — HEPARIN SODIUM (PORCINE) 1000 UNIT/ML IJ SOLN
INTRAMUSCULAR | Status: AC
  Filled 2019-10-05: qty 1

## 2019-10-05 MED ORDER — HEPARIN (PORCINE) IN NACL 1000-0.9 UT/500ML-% IV SOLN
INTRAVENOUS | Status: AC
  Filled 2019-10-05: qty 1000

## 2019-10-05 MED ORDER — FENTANYL CITRATE (PF) 100 MCG/2ML IJ SOLN
INTRAMUSCULAR | Status: AC
  Filled 2019-10-05: qty 2

## 2019-10-05 MED ORDER — LIDOCAINE HCL (PF) 1 % IJ SOLN
INTRAMUSCULAR | Status: DC | PRN
  Administered 2019-10-05: 15 mL

## 2019-10-05 MED ORDER — ACETAMINOPHEN 325 MG PO TABS: 650.0000 mg | ORAL_TABLET | ORAL | Status: DC | PRN

## 2019-10-05 MED ORDER — SODIUM CHLORIDE 0.9 % IV SOLN: 250.0000 mL | INTRAVENOUS | Status: DC | PRN

## 2019-10-05 MED ORDER — SODIUM CHLORIDE 0.9 % WEIGHT BASED INFUSION: 1.0000 mL/kg/h | INTRAVENOUS | Status: DC

## 2019-10-05 MED ORDER — SODIUM CHLORIDE 0.9% FLUSH: 3.0000 mL | INTRAVENOUS | Status: DC | PRN

## 2019-10-05 MED ORDER — LABETALOL HCL 5 MG/ML IV SOLN: 10.0000 mg | INTRAVENOUS | Status: DC | PRN

## 2019-10-05 MED ORDER — MIDAZOLAM HCL 2 MG/2ML IJ SOLN
INTRAMUSCULAR | Status: DC | PRN
  Administered 2019-10-05: 2 mg via INTRAVENOUS
  Administered 2019-10-05 (×3): 1 mg via INTRAVENOUS

## 2019-10-05 MED ORDER — HYDRALAZINE HCL 20 MG/ML IJ SOLN
5.0000 mg | INTRAMUSCULAR | Status: DC | PRN
  Administered 2019-10-05: 10 mg via INTRAVENOUS

## 2019-10-05 MED ORDER — HEPARIN (PORCINE) IN NACL 1000-0.9 UT/500ML-% IV SOLN
INTRAVENOUS | Status: DC | PRN
  Administered 2019-10-05 (×2): 500 mL

## 2019-10-05 MED ORDER — NITROGLYCERIN 1 MG/10 ML FOR IR/CATH LAB
INTRA_ARTERIAL | Status: DC | PRN
  Administered 2019-10-05: 200 ug via INTRA_ARTERIAL

## 2019-10-05 SURGICAL SUPPLY — 16 items
CATH ANGIO 5F BER 100CM (CATHETERS) ×1 IMPLANT
CATH ANGIO 5F PIGTAIL 100CM (CATHETERS) ×1 IMPLANT
CATH INFINITI VERT 5FR 125CM (CATHETERS) ×1 IMPLANT
CATH OMNI FLUSH 5F 65CM (CATHETERS) ×1 IMPLANT
KIT MICROPUNCTURE NIT STIFF (SHEATH) ×1 IMPLANT
KIT PV (KITS) ×2 IMPLANT
SHEATH PINNACLE 5F 10CM (SHEATH) ×1 IMPLANT
SHEATH PROBE COVER 6X72 (BAG) ×1 IMPLANT
STOPCOCK MORSE 400PSI 3WAY (MISCELLANEOUS) ×1 IMPLANT
SYR MEDRAD MARK 7 150ML (SYRINGE) ×2 IMPLANT
TRANSDUCER W/STOPCOCK (MISCELLANEOUS) ×2 IMPLANT
TRAY PV CATH (CUSTOM PROCEDURE TRAY) ×2 IMPLANT
TUBING HIGH PRESSURE 120CM (CONNECTOR) ×1 IMPLANT
WIRE BENTSON .035X145CM (WIRE) ×1 IMPLANT
WIRE HI TORQ VERSACORE 300 (WIRE) ×1 IMPLANT
WIRE ROSEN-J .035X260CM (WIRE) ×1 IMPLANT

## 2019-10-05 NOTE — Op Note (Signed)
Patient name: David [name] MRN: [id number] DOB: Sep 19, 1950 Sex: male  10/05/2019 Pre-operative Diagnosis: Lower extremity in-stent stenosis bilateral Post-operative diagnosis:  Same Surgeon:  [name] Procedure Performed:  1.  Ultrasound-guided access, left brachial artery  2.  Abdominal aortogram  3.  Bilateral lower extremity runoff  4.  Second-order catheterization x2  5.  Conscious sedation 80 minutes    Indications: The patient has undergone multiple procedures for aneurysmal disease including open aortic repair with a tube graft, right popliteal artery stenting, left above-knee to below-knee popliteal bypass graft and stenting of the proximal anastomosis.  His most recent ultrasound showed recurrent stenosis bilaterally.  He is here today for further evaluation and possible intervention.  Procedure:  The patient was identified in the holding area and taken to room 8.  The patient was then placed supine on the table and prepped and draped in the usual sterile fashion.  A time out was called.  Conscious sedation was administered with the use of IV fentanyl and Versed under continuous physician and nurse monitoring.  Heart rate, blood pressure, and oxygen saturation were continuously monitored.  Total sedation time was 80 minutes.  Ultrasound was used to evaluate the left brachial artery.  It is widely patent without stenosis.  1% lidocaine was used for local anesthesia.  The left brachial artery was then cannulated under ultrasound guidance with a micropuncture needle.  An 018 wire was advanced without resistance and a micropuncture sheath was placed.  The 018 wire was removed and a benson wire was placed.  The micropuncture sheath was exchanged for a 5 french sheath.  200 mcg of nitroglycerin and 2000 units of heparin were administered using a Berenstein 1 catheter, the descending thoracic aorta was selected.  A pigtail catheter was then advanced to L1 and an abdominal aortogram was  obtained followed by bilateral runoff.  I then used a vert catheter and selected the left and right external iliac arteries for additional imaging. Findings:   Aortogram: Renal artery stenosis was not visualized.  A tube graft exists in the infrarenal aorta without stenosis.  Bilateral common and external iliac arteries appear to be disease-free.  Right Lower Extremity: The right common femoral artery is patent with exophytic plaque that does not result in any significant stenosis.  The profundofemoral artery is widely patent.  The superficial femoral artery is diffusely diseased throughout with several areas of greater than 50% stenosis.  The stent within the superficial femoral and popliteal artery are widely patent.  The below-knee popliteal artery is very calcified with stenosis greater than 50%.  There is three-vessel runoff.  Left Lower Extremity: The left common femoral artery is calcified without significant stenosis.  Profundofemoral artery is heavily calcified without significant stenosis.  The superficial femoral artery is heavily calcified there is a lesion approximately 50% in the proximal portion.  The superficial femoral artery in the midportion is diffusely diseased with proximal 50% stenosis.  A bypass graft is visualized in the proximal popliteal artery with a stent across the proximal anastomosis which is widely patent.  The distal anastomosis is widely patent and there is three-vessel runoff.  Intervention: None  Impression:  #1  The patient has extensive calcific plaque in the right proximal superficial femoral artery as well as the right popliteal artery below his stents.  He is going to need intervention of both areas.  This will need to be done in the operating room.  #2  The left superficial femoral artery proximal  to his bypass has several lesions about 50%.  Stent within the above-knee to below-knee popliteal bypass graft is widely patent.  There is three-vessel runoff    V.  [name], M.D., [facility] of [place] Office: [phone] Pager:  [phone]

## 2019-10-05 NOTE — Interval H&P Note (Signed)
History and Physical Interval Note:  10/05/2019 7:53 AM  David [name]  has presented today for surgery, with the diagnosis of PAD.  The various methods of treatment have been discussed with the patient and family. After consideration of risks, benefits and other options for treatment, the patient has consented to  Procedure(s): ABDOMINAL AORTOGRAM W/LOWER EXTREMITY (Bilateral) as a surgical intervention.  The patient's history has been reviewed, patient examined, no change in status, stable for surgery.  I have reviewed the patient's chart and labs.  Questions were answered to the patient's satisfaction.     [name]

## 2019-10-05 NOTE — Discharge Instructions (Signed)

## 2019-10-05 NOTE — Progress Notes (Addendum)
Site area: Left Brachial a 5 french arterial sheath was removed by Barrie Folk [facility]  Site Prior to Removal:  Level 0  Pressure Applied For 30 MINUTES    Bedrest Beginning at 1020am  Manual:   Yes.    Patient Status During Pull:  stable  Post Pull Groin Site:  Level 0  Post Pull Instructions Given:  Yes.    Post Pull Pulses Present:  Yes.    Dressing Applied:  Yes.    Comments:

## 2019-10-05 NOTE — Progress Notes (Signed)
Brachial site unremarkable.  Instructions given to pt and wife verbally and in writing. Both verbalize understanding and deny further questions

## 2019-10-18 ENCOUNTER — Encounter: Payer: Self-pay | Admitting: Surgery

## 2019-10-18 ENCOUNTER — Other Ambulatory Visit: Payer: Self-pay

## 2019-10-18 ENCOUNTER — Ambulatory Visit (INDEPENDENT_AMBULATORY_CARE_PROVIDER_SITE_OTHER): Payer: Medicare Other | Admitting: Surgery

## 2019-10-18 VITALS — BP 162/65 | HR 58 | Temp 97.8°F | Resp 20 | Ht 70.0 in | Wt 275.0 lb

## 2019-10-18 DIAGNOSIS — I714 Abdominal aortic aneurysm, without rupture, unspecified: Secondary | ICD-10-CM

## 2019-10-18 DIAGNOSIS — I6523 Occlusion and stenosis of bilateral carotid arteries: Secondary | ICD-10-CM | POA: Diagnosis not present

## 2019-10-18 DIAGNOSIS — I70213 Atherosclerosis of native arteries of extremities with intermittent claudication, bilateral legs: Secondary | ICD-10-CM | POA: Diagnosis not present

## 2019-10-18 DIAGNOSIS — I779 Disorder of arteries and arterioles, unspecified: Secondary | ICD-10-CM

## 2019-10-18 NOTE — Progress Notes (Signed)
[facility] [place]  Patient name: David [name] MRN: [id number] DOB: February 21, 1951 Sex: male   REASON FOR VISIT:    Follow up angiogram  HISOTRY OF PRESENT ILLNESS:    David [name] is a 69 y.o. male who has undergone open abdominal aortic aneurysm repair by Dr. Amedeo Plenty in 2012. He's had viabahn stenting of his right popliteal aneurysm in 12/2008. He had his left popliteal aneurysm repaired by an above-knee to below-knee bypass in October of 2010.  He's had a midline abdominal hernia repair by Dr. Johney Maine. On 08-08-2019, the proximal anastamosis to his bypass graft was stented after a dissection following PTA.  On 01/12/2013 he underwent angiography with a successful balloon angioplasty of the stent using a 6 mm balloon.  On 11/23/2014 he underwent drug coated balloon angioplasty of the left proximal anastomotic stent.Marland Kitchen He also underwent balloon angioplasty of a left common iliac stenosis and angioplasty of the right common iliac stenosis.He had done fairly well until recently when he was found to have a decrease in ABI's and elevated velocities.  I performed angiography via a left brachial approach on 10-05-2019 and found progressive native disease in both legs, left worse than right. He is back to discuss these results.  He denies any claudication symptoms.  He does not have any open wounds.   He remains in atrial fibrillation which is treated with Eliquis. He is also on Plavix. He has no symptoms of claudication. He has had no neurologic symptoms.   PAST MEDICAL HISTORY:   Past Medical History:  Diagnosis Date  . AAA (abdominal aortic aneurysm) ([place])   . 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 ([place]) 09/07/2008  . Anticardiolipin antibody positive   . Anxiety   . ANXIETY 07/19/2008  . CEREBROVASCULAR ACCIDENT, HX OF 07/19/2008  . CVA (cerebral vascular accident) ([place]) 2003  . Depression   . DEPRESSION 07/19/2008  . Diabetes  mellitus    type II  . DIABETES MELLITUS, TYPE II 01/18/2008  . Diverticulosis   . GERD 07/19/2008  . GERD (gastroesophageal reflux disease)   . Hemorrhoids   . Hx of adenomatous colonic polyps   . Hyperlipidemia   . HYPERLIPIDEMIA 07/19/2008  . Hypertension   . HYPERTENSION 07/31/2009  . Increased prostate specific antigen (PSA) velocity 09/13/2010  . Internal hemorrhoids   . Perineal abscess   . Peripheral vascular disease ([place])    peripheral vascular disease/carotid artery <100%  . PERSONAL HX COLONIC POLYPS 12/10/2007  . Unspecified Peripheral Vascular Disease 01/18/2008  . Varicose veins      FAMILY HISTORY:   Family History  Problem Relation Age of Onset  . Aortic aneurysm Mother        desceding aoritic aneurysm  . Cancer Brother        Lung cancer  . Colon cancer Neg Hx   . Esophageal cancer Neg Hx   . Stomach cancer Neg Hx   . Rectal cancer Neg Hx     SOCIAL HISTORY:   Social History   Tobacco Use  . Smoking status: Former Smoker    Types: Cigarettes    Quit date: 01/14/1992    Years since quitting: 27.7  . Smokeless tobacco: Never Used  Substance Use Topics  . Alcohol use: Yes    Alcohol/week: 2.0 standard drinks    Types: 2 Glasses of wine per week    Comment: wine daily     ALLERGIES:   Allergies  Allergen Reactions  . Latex  Rash     CURRENT MEDICATIONS:   Current Outpatient Medications  Medication Sig Dispense Refill  . amLODipine (NORVASC) 5 MG tablet TAKE (2) TABLETS BY MOUTH DAILY. (Patient taking differently: Take 10 mg by mouth daily. ) 60 tablet 0  . Ascorbic Acid (VITAMIN C) 1000 MG tablet Take 1,000 mg by mouth daily.    Marland Kitchen atorvastatin (LIPITOR) 40 MG tablet TAKE 1 TABLET ONCE DAILY. (Patient taking differently: Take 40 mg by mouth daily. ) 90 tablet 3  . cetirizine (ZYRTEC) 10 MG tablet Take 1 tablet (10 mg total) by mouth daily. 90 tablet 3  . Cholecalciferol (VITAMIN D) 50 MCG (2000 UT) tablet Take 2,000 Units by mouth daily.    .  clopidogrel (PLAVIX) 75 MG tablet TAKE 1 TABLET EACH DAY. (Patient taking differently: Take 75 mg by mouth daily. ) 90 tablet 3  . docusate sodium (COLACE) 100 MG capsule Take 100 mg by mouth 2 (two) times daily.     Marland Kitchen ELIQUIS 5 MG TABS tablet TAKE 1 TABLET BY MOUTH TWICE DAILY. (Patient taking differently: Take 5 mg by mouth 2 (two) times daily. ) 180 tablet 1  . famotidine (PEPCID) 20 MG tablet Take 20 mg by mouth 2 (two) times daily.    . folic acid (FOLVITE) 035 MCG tablet Take 800 mcg by mouth daily.    . furosemide (LASIX) 40 MG tablet Take 40 mg by mouth daily.    Marland Kitchen gemfibrozil (LOPID) 600 MG tablet TAKE 2 TABLETS BY MOUTH ONCE DAILY. (Patient taking differently: Take 600 mg by mouth in the morning and at bedtime. ) 180 tablet 3  . metFORMIN (GLUCOPHAGE-XR) 500 MG 24 hr tablet TAKE 4 TABLETS EVERY AM. (Patient taking differently: Take 2,000 mg by mouth daily. ) 360 tablet 1  . metroNIDAZOLE (METROGEL) 0.75 % gel Apply 1 application topically every other day.     . Multiple Vitamin (MULTIVITAMIN) capsule Take 1 capsule by mouth daily.     Marland Kitchen olmesartan (BENICAR) 40 MG tablet Take 1 tablet (40 mg total) by mouth daily. Follow-up appt due in August must see provider for future refills 90 tablet 0  . Omega-3 Fatty Acids (FISH OIL) 1000 MG CAPS Take 1,000 mg by mouth daily.    . pioglitazone (ACTOS) 30 MG tablet TAKE 1 TABLET EACH DAY. (Patient taking differently: Take 30 mg by mouth daily. ) 90 tablet 0  . psyllium (FIBER LAXATIVE) 0.52 g capsule Take 3.36 g by mouth daily with lunch.    . triamcinolone (NASACORT) 55 MCG/ACT AERO nasal inhaler Place 2 sprays into the nose daily. (Patient taking differently: Place 1 spray into the nose daily as needed (allergies). ) 1 Inhaler 12   No current facility-administered medications for this visit.    REVIEW OF SYSTEMS:   [X]  denotes positive finding, [ ]  denotes negative finding Cardiac  Comments:  Chest pain or chest pressure:    Shortness of  breath upon exertion:    Short of breath when lying flat:    Irregular heart rhythm:        Vascular    Pain in calf, thigh, or hip brought on by ambulation:    Pain in feet at night that wakes you up from your sleep:     Blood clot in your veins:    Leg swelling:         Pulmonary    Oxygen at home:    Productive cough:     Wheezing:  Neurologic    Sudden weakness in arms or legs:     Sudden numbness in arms or legs:     Sudden onset of difficulty speaking or slurred speech:    Temporary loss of vision in one eye:     Problems with dizziness:         Gastrointestinal    Blood in stool:     Vomited blood:         Genitourinary    Burning when urinating:     Blood in urine:        Psychiatric    Major depression:         Hematologic    Bleeding problems:    Problems with blood clotting too easily:        Skin    Rashes or ulcers:        Constitutional    Fever or chills:      PHYSICAL EXAM:   Vitals:   10/18/19 0924  BP: (!) 162/65  Pulse: (!) 58  Resp: 20  Temp: 97.8 F (36.6 C)  SpO2: 98%  Weight: 275 lb (124.7 kg)  Height: 5\' 10"  (1.778 m)    GENERAL: The patient is a well-nourished male, in no acute distress. The vital signs are documented above. CARDIAC: There is a regular rate and rhythm.  PULMONARY: Non-labored respirations MUSCULOSKELETAL: There are no major deformities or cyanosis. NEUROLOGIC: No focal weakness or paresthesias are detected. SKIN: There are no ulcers or rashes noted. PSYCHIATRIC: The patient has a normal affect.  STUDIES:   None  MEDICAL ISSUES:   We had an extensive discussion today and reviewed his most recent arteriogram.  He has had native artery disease progression on the right both proximal and distal to his stents.  He has also had progression of the native left superficial femoral artery.  He does not have claudication symptoms.  We discussed that as long as he remains symptom-free, that we will monitor the  native artery stenosis.  I have scheduled for follow-up again in 6 months.  He knows to contact me sooner if he has a change in symptomatology.    Leia Alf, MD, FACS [facility] [phone] Pager [phone]

## 2019-10-20 ENCOUNTER — Other Ambulatory Visit: Payer: Self-pay | Admitting: *Deleted

## 2019-10-20 DIAGNOSIS — I779 Disorder of arteries and arterioles, unspecified: Secondary | ICD-10-CM

## 2019-11-10 ENCOUNTER — Other Ambulatory Visit: Payer: Self-pay | Admitting: Cardiovascular Disease

## 2019-11-10 ENCOUNTER — Other Ambulatory Visit: Payer: Self-pay | Admitting: Internal Medicine

## 2019-11-10 DIAGNOSIS — I4819 Other persistent atrial fibrillation: Secondary | ICD-10-CM

## 2019-11-10 NOTE — Telephone Encounter (Signed)
Eliquis 5mg  refill request received. Patient is 69 years old, weight-124.7kg, Crea-1.30 on 10/05/2019, Diagnosis-Afib, and last seen by Dr. Angelena Form on 11/26/2018 and has an appt with Ermalinda Barrios on 11/23/2019. Dose is appropriate based on dosing criteria. Will send in refill to requested pharmacy.

## 2019-11-10 NOTE — Telephone Encounter (Signed)
Please refill as per office routine med refill policy (all routine meds refilled for 3 mo or monthly per pt preference up to one year from last visit, then month to month grace period for 3 mo, then further med refills will have to be denied)  

## 2019-11-12 ENCOUNTER — Encounter: Payer: Self-pay | Admitting: *Deleted

## 2019-11-16 IMAGING — US US BIOPSY
1 series · 10 of 10 positions shown · non-contrast
Comparison: none

INDICATION: 67-year-old with proteinuria.  Request for renal biopsy.

[Series 1: us biopsy · 0.27mm/px · 10 of 10 slices shown]
[im 1/10]
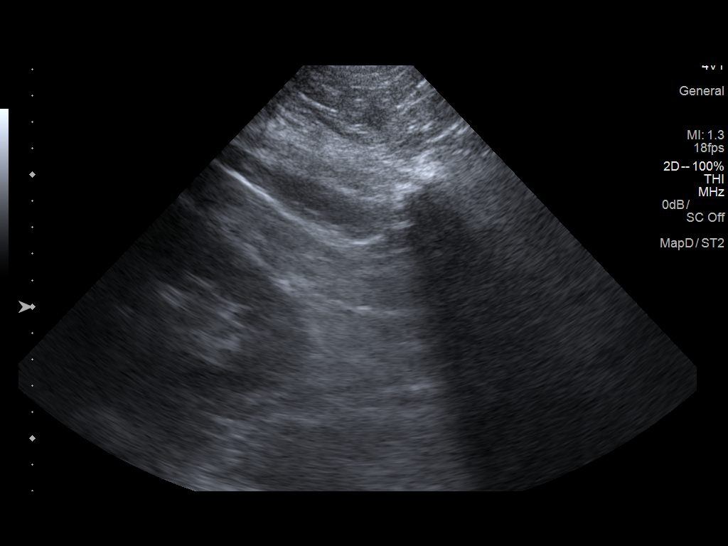
[im 2/10]
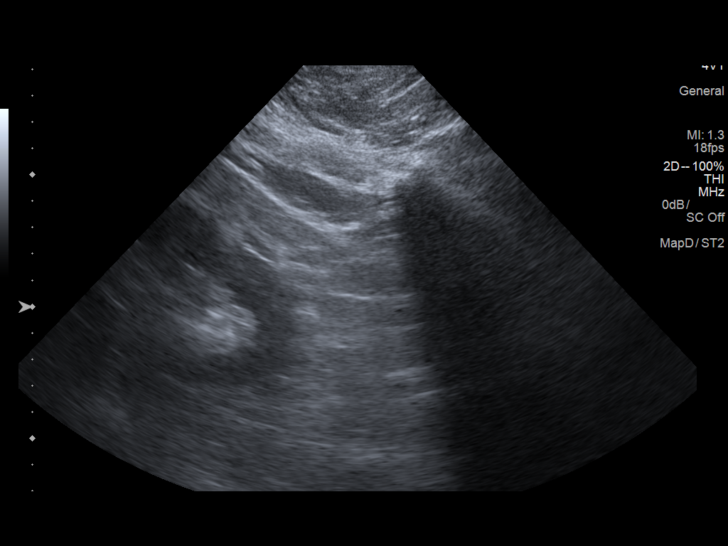
[im 3/10]
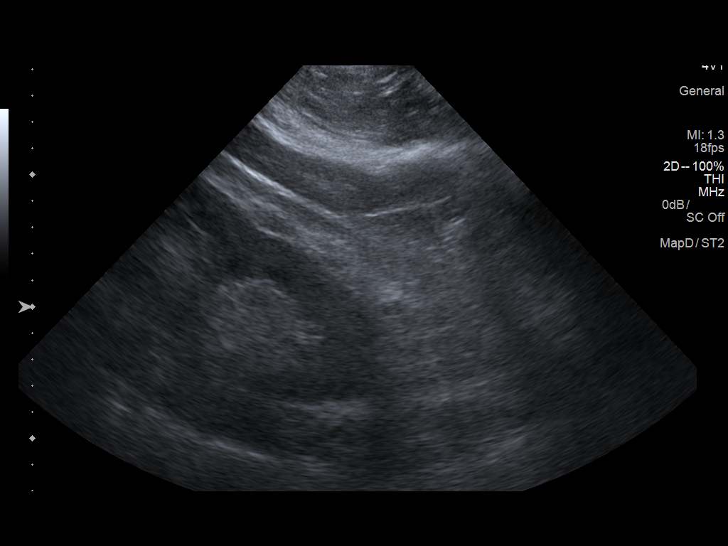
[im 4/10]
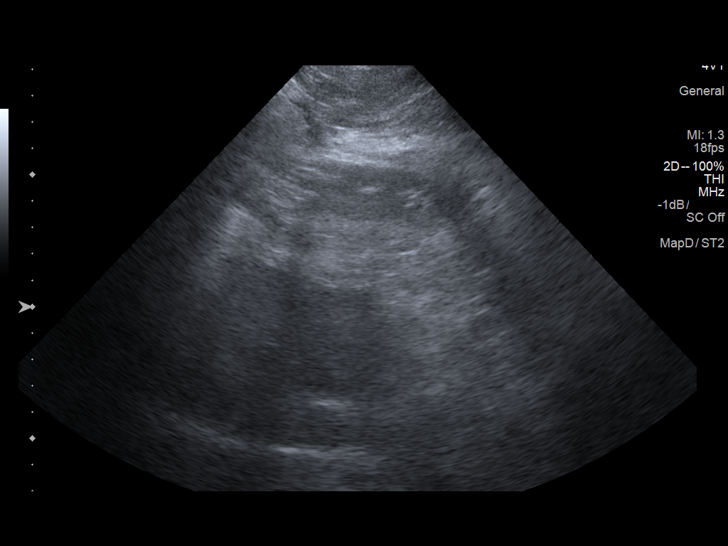
[im 5/10]
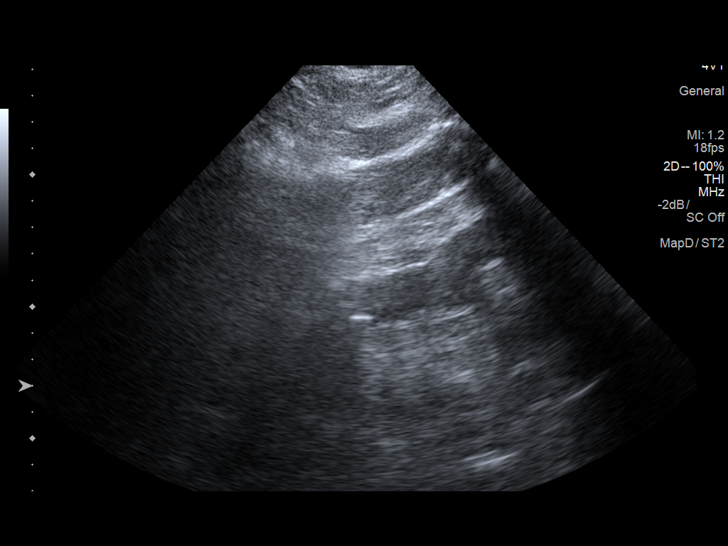
[im 6/10]
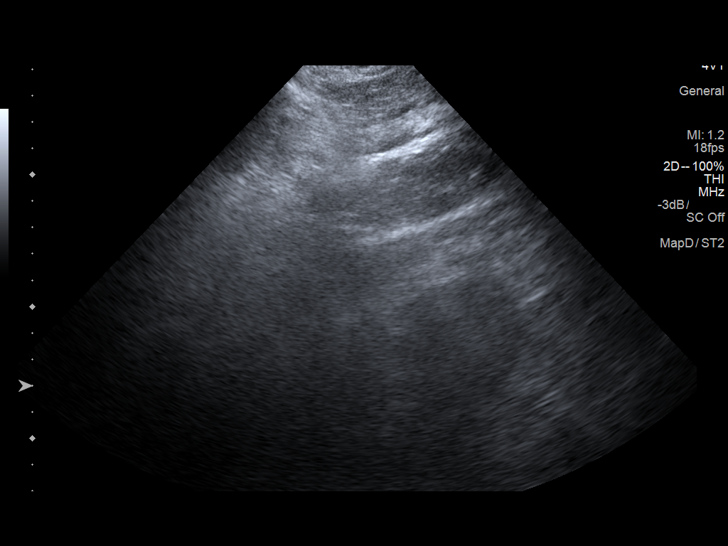
[im 7/10]
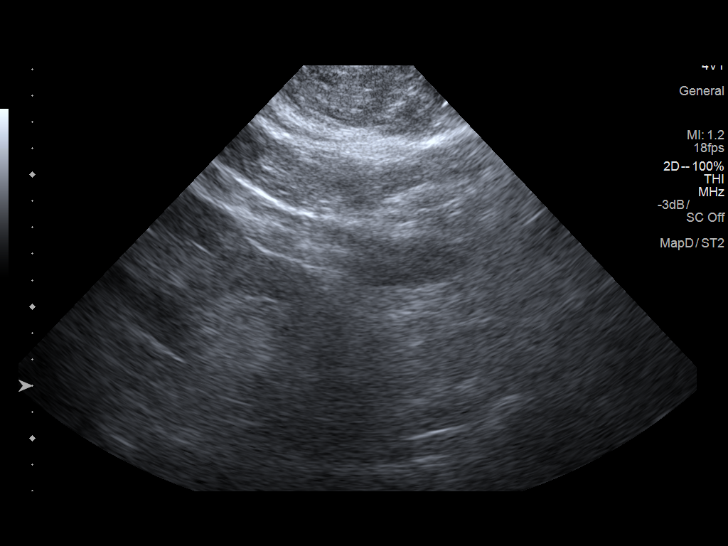
[im 8/10]
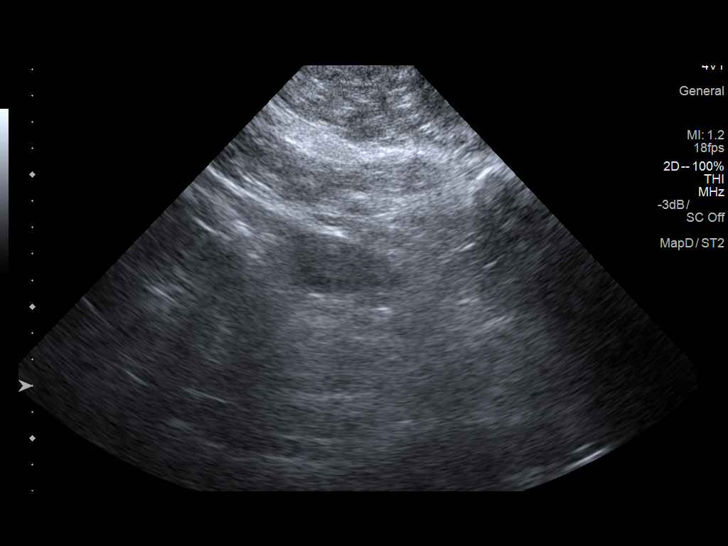
[im 9/10]
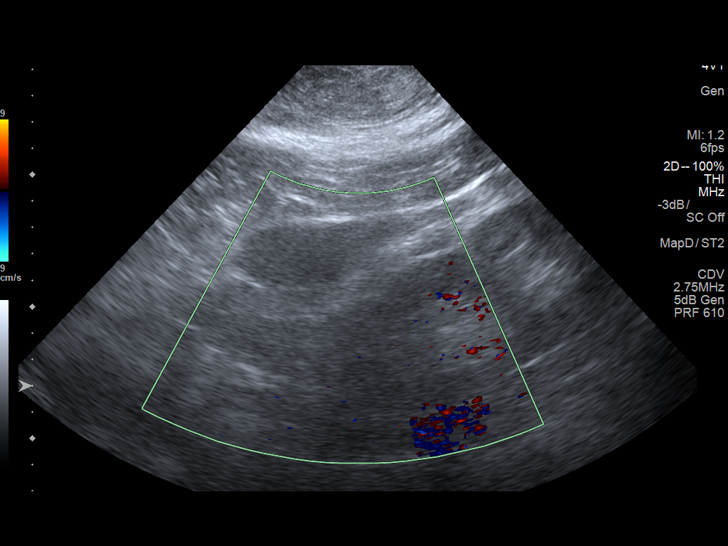
[im 10/10]
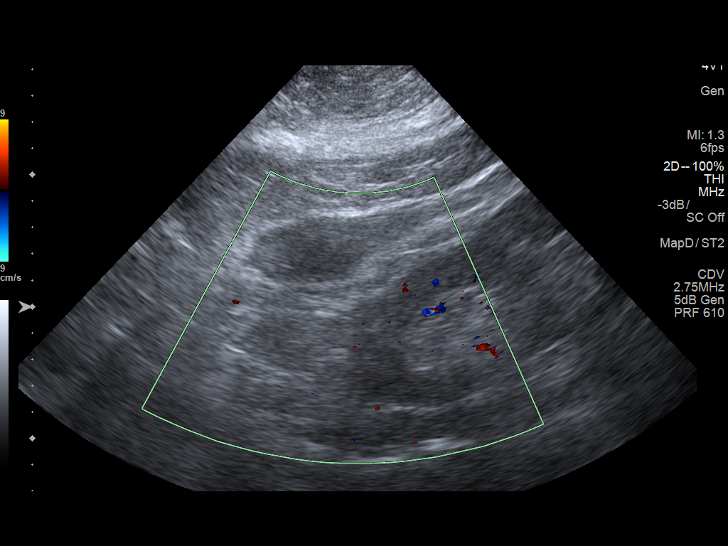

[10 of 10 positions shown; findings below may reference images not displayed]

EXAM:
ULTRASOUND-GUIDED RIGHT RENAL BIOPSY

MEDICATIONS:
None.

ANESTHESIA/SEDATION:
Moderate (conscious) sedation was employed during this procedure. A
total of Versed 1.5 mg and Fentanyl 100 mcg was administered
intravenously.

Moderate Sedation Time: 22 minutes. The patient's level of
consciousness and vital signs were monitored continuously by
radiology nursing throughout the procedure under my direct
supervision.

FLUOROSCOPY TIME:  None

COMPLICATIONS:
None immediate.

PROCEDURE:
Informed written consent was obtained from the patient after a
thorough discussion of the procedural risks, benefits and
alternatives. All questions were addressed. A timeout was performed
prior to the initiation of the procedure.

Both kidneys were evaluated with ultrasound. The right kidney was
selected for biopsy. The right flank was prepped with chlorhexidine
and sterile field was created. Skin and soft tissues were
anesthetized with 1% lidocaine. Using real-time ultrasound guidance,
16 gauge core needle was directed into the right kidney lower pole
and core biopsy was obtained. Specimen placed in saline. A second
core biopsy was obtained with ultrasound guidance with the 16 gauge
needle. Second core biopsy was adequate and placed in saline. Post
biopsy ultrasound images were obtained. Bandage placed over the
puncture site. Patient was hemodynamically stable throughout the
procedure.
FINDINGS: Core biopsies obtained from the right kidney lower pole. Question a
small hematoma in the perinephric space identified immediately
following the core biopsies. This area was watched for a few minutes
and did not appear to be enlarging. No evidence for active bleeding.
IMPRESSION: Successful ultrasound-guided core biopsies of the right kidney lower
pole.

## 2019-11-17 NOTE — Progress Notes (Deleted)
Cardiology Office Note    Date:  11/17/2019   ID:  David [name], DOB 09-28-1950, MRN [id number]  PCP:  David Borg, MD  Cardiologist: David Chandler, MD EPS: None  No chief complaint on file.   History of Present Illness:  David [name] is a 69 y.o. male with history of HTN, HLD, DM, persistent atrial fibrillation-no AV nodal blocking agents on Eliquis and Plavix. NST 2010 no ischemia, normal LVEF 55-60% echo 11/2014, PAD, AAA status post open repair 2012, remote CVA, GERD  Patient last saw Dr. Angelena [name] 11/26/2018 at which time he recommended repeat echo in 6 months for mild MR on prior echo.  This has not been done.    Past Medical History:  Diagnosis Date  . AAA (abdominal aortic aneurysm) ([place])   . 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 Anticardiolipin antibody positive   . Anxiety   . ANXIETY 07/19/2008  . CEREBROVASCULAR ACCIDENT, HX OF 07/19/2008  . CVA (cerebral vascular accident) ([place]) 2003  . Depression   . DEPRESSION 07/19/2008  . Diabetes mellitus    type II  . DIABETES MELLITUS, TYPE II 01/18/2008  . Diverticulosis   . GERD 07/19/2008  . GERD (gastroesophageal reflux disease)   . Hemorrhoids   . Hx of adenomatous colonic polyps   . Hyperlipidemia   . HYPERLIPIDEMIA 07/19/2008  . Hypertension   . HYPERTENSION 07/31/2009  . Increased prostate specific antigen (PSA) velocity 09/13/2010  . Internal hemorrhoids   . Perineal abscess   . Peripheral vascular disease ([place])    peripheral vascular disease/carotid artery <100%  . PERSONAL HX COLONIC POLYPS 12/10/2007  . Unspecified Peripheral Vascular Disease 01/18/2008  . Varicose veins     Past Surgical History:  Procedure Laterality Date  . 7 abdominal hernia repairs  05/31/10  . ABDOMINAL AORTAGRAM N/A 01/12/2013   Procedure: ABDOMINAL Maxcine Ham;  Surgeon: Serafina Mitchell, MD;  Location: [facility] CATH LAB;  Service: Cardiovascular;  Laterality: N/A;  . ABDOMINAL AORTIC ANEURYSM REPAIR    .  ABDOMINAL AORTOGRAM W/LOWER EXTREMITY Bilateral 10/05/2019   Procedure: ABDOMINAL AORTOGRAM W/LOWER EXTREMITY;  Surgeon: Serafina Mitchell, MD;  Location: [place] CV LAB;  Service: Cardiovascular;  Laterality: Bilateral;  . COLONOSCOPY    . hernia surgury     69 years old  . perineal abscess    . PERIPHERAL VASCULAR CATHETERIZATION N/A 11/23/2014   Procedure: Abdominal Aortogram;  Surgeon: Serafina Mitchell, MD;  Location: [place] CV LAB;  Service: Cardiovascular;  Laterality: N/A;  . PERIPHERAL VASCULAR CATHETERIZATION  11/23/2014   Procedure: Lower Extremity Angiography;  Surgeon: Serafina Mitchell, MD;  Location: [place] CV LAB;  Service: Cardiovascular;;  . PERIPHERAL VASCULAR CATHETERIZATION  11/23/2014   Procedure: Peripheral Vascular Intervention;  Surgeon: Serafina Mitchell, MD;  Location: [place] CV LAB;  Service: Cardiovascular;;  PTA bilateral common iliac PTA/DCB left fem-pop  . POLYPECTOMY    . 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 SP ENDO REPAIR INFRAREN AAA      Current Medications: No outpatient medications have been marked as taking for the 11/23/19 encounter (Appointment) with Imogene Burn, PA-C.     Allergies:   Latex   Social History   Socioeconomic History  . Marital status: Married    Spouse name: Not on file  . Number of children: 1  . Years of education: Not on file  . Highest education level: Not on file  Occupational History  . Occupation: dsiabled due to stroke    Employer: RETIRED  Tobacco Use  . Smoking status: Former Smoker    Types: Cigarettes    Quit date: 01/14/1992    Years since quitting: 27.8  . Smokeless tobacco: Never Used  Vaping Use  . Vaping Use: Never used  Substance and Sexual Activity  . Alcohol use: Yes    Alcohol/week: 2.0 standard drinks    Types: 2 Glasses of wine per week    Comment: wine daily  . Drug use: No  . Sexual activity: Not on file  Other Topics Concern  . Not on file  Social History  Narrative   2 cups of coffee in the morning and tea throughout the day.   Social Determinants of Health   Financial Resource Strain:   . Difficulty of Paying Living Expenses:   Food Insecurity:   . Worried About Charity fundraiser in the Last Year:   . Arboriculturist in the Last Year:   Transportation Needs:   . Film/video editor (Medical):   Marland Kitchen Lack of Transportation (Non-Medical):   Physical Activity:   . Days of Exercise per Week:   . Minutes of Exercise per Session:   Stress:   . Feeling of Stress :   Social Connections:   . Frequency of Communication with Friends and Family:   . Frequency of Social Gatherings with Friends and Family:   . Attends Religious Services:   . Active Member of Clubs or Organizations:   . Attends Archivist Meetings:   Marland Kitchen Marital Status:      Family History:  The patient's ***family history includes Aortic aneurysm in his mother; Cancer in his brother.   ROS:   Please see the history of present illness.    ROS All other systems reviewed and are negative.   PHYSICAL EXAM:   VS:  There were no vitals taken for this visit.  Physical Exam  GEN: Well nourished, well developed, in no acute distress  HEENT: normal  Neck: no JVD, carotid bruits, or masses Cardiac:RRR; no murmurs, rubs, or gallops  Respiratory:  clear to auscultation bilaterally, normal work of breathing GI: soft, nontender, nondistended, + BS Ext: without cyanosis, clubbing, or edema, Good distal pulses bilaterally MS: no deformity or atrophy  Skin: warm and dry, no rash Neuro:  Alert and Oriented x 3, Strength and sensation are intact Psych: euthymic mood, full affect  Wt Readings from Last 3 Encounters:  10/18/19 275 lb (124.7 kg)  10/05/19 265 lb (120.2 kg)  09/13/19 271 lb (122.9 kg)      Studies/Labs Reviewed:   EKG:  EKG is*** ordered today.  The ekg ordered today demonstrates ***  Recent Labs: 07/13/2019: ALT 12; Platelets 250.0; TSH  1.97 10/05/2019: BUN 34; Creatinine, Ser 1.30; Hemoglobin 11.9; Potassium 4.1; Sodium 141   Lipid Panel    Component Value Date/Time   CHOL 128 07/13/2019 1009   TRIG 178.0 (H) 07/13/2019 1009   HDL 40.70 07/13/2019 1009   CHOLHDL 3 07/13/2019 1009   VLDL 35.6 07/13/2019 1009   LDLCALC 52 07/13/2019 1009   LDLDIRECT 56.0 12/22/2018 0819    Additional studies/ records that were reviewed today include:   Echo 2016 Study Conclusions   - Left ventricle: The cavity size was normal. Wall thickness was    normal. Systolic function was normal. The estimated ejection    fraction was in the range of 55% to 60%.  Wall motion was normal;    there were no regional wall motion abnormalities.  - Ascending aorta: The ascending aorta was mildly dilated.  - Mitral valve: There was mild regurgitation.  - Left atrium: The atrium was moderately dilated.  - Right atrium: The atrium was mildly dilated.   Impressions:   - Normal LV function; biatrial enlargement; mild MR; trace TR.     ASSESSMENT:    1. Persistent atrial fibrillation ([place])   2. Essential hypertension   3. Hyperlipidemia, unspecified hyperlipidemia type   4. PVD (peripheral vascular disease) with claudication ([place])   5. Non-rheumatic mitral regurgitation      PLAN:  In order of problems listed above:  Persistent atrial fibrillation no AV nodal blocking agents heart rate controlled, on Eliquis  Essential hypertension  Hyperlipidemia  PVD followed by VBS  Mild MR on echo in 2016 Dr. Angelena [name] recommended repeat but not done yet    Medication Adjustments/Labs and Tests Ordered: Current medicines are reviewed at length with the patient today.  Concerns regarding medicines are outlined above.  Medication changes, Labs and Tests ordered today are listed in the Patient Instructions below. There are no Patient Instructions on file for this visit.   Signed, Ermalinda Barrios, PA-C  11/17/2019 1:27 PM    [facility] [place], [place], [place]  [address] Phone: [phone]; Fax: [phone]

## 2019-11-23 ENCOUNTER — Ambulatory Visit: Payer: Medicare Other | Admitting: Physician Assistant

## 2019-11-29 NOTE — Progress Notes (Signed)
Cardiology Office Note    Date:  11/30/2019   ID:  David [name], DOB October 05, 1950, MRN [id number]  PCP:  Biagio Borg, MD  Cardiologist: Lauree Chandler, MD EPS: None  No chief complaint on file.   History of Present Illness:  David [name] is a 70 y.o. male with history of hypertension HLD, DM, persistent atrial fibrillation on Eliquis, PAD, AAA status post open repair 2012, remote CVA, GERD.  NST 2010 no ischemia, echo 11/2014 normal LVEF 55 to 60%.  Patient recently had an arteriogram by Dr. Trula Slade in June and has native artery disease progression the right proximal and distal areas to his stent and progression of the native left superficial femoral artery.  He was not having claudication symptoms so medical therapy was recommended unless he becomes symptomatic. On Plavix.  Patient last saw Dr. Angelena Form in 11/2018 and wanted an echo done in 6 months to assess mild MR but it has not been done.  Patient comes in for f/u. Denies chest pain, palpitations, dyspnea, dizziness or presyncope. Some bruising on eliquis and plavix but no significant bleeding. Activity limited by knee problems.   Past Medical History:  Diagnosis Date  . AAA (abdominal aortic aneurysm) ([place])   . 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 Anticardiolipin antibody positive   . Anxiety   . ANXIETY 07/19/2008  . CEREBROVASCULAR ACCIDENT, HX OF 07/19/2008  . CVA (cerebral vascular accident) ([place]) 2003  . Depression   . DEPRESSION 07/19/2008  . Diabetes mellitus    type II  . DIABETES MELLITUS, TYPE II 01/18/2008  . Diverticulosis   . GERD 07/19/2008  . GERD (gastroesophageal reflux disease)   . Hemorrhoids   . Hx of adenomatous colonic polyps   . Hyperlipidemia   . HYPERLIPIDEMIA 07/19/2008  . Hypertension   . HYPERTENSION 07/31/2009  . Increased prostate specific antigen (PSA) velocity 09/13/2010  . Internal hemorrhoids   . Perineal abscess   . Peripheral vascular disease ([place])     peripheral vascular disease/carotid artery <100%  . PERSONAL HX COLONIC POLYPS 12/10/2007  . Unspecified Peripheral Vascular Disease 01/18/2008  . Varicose veins     Past Surgical History:  Procedure Laterality Date  . 7 abdominal hernia repairs  05/31/10  . ABDOMINAL AORTAGRAM N/A 01/12/2013   Procedure: ABDOMINAL Maxcine Ham;  Surgeon: Serafina Mitchell, MD;  Location[facility];  Service: Cardiovascular;  Laterality: N/A;  . ABDOMINAL AORTIC ANEURYSM REPAIR    . ABDOMINAL AORTOGRAM W/LOWER EXTREMITY Bilateral 10/05/2019   Procedure: ABDOMINAL AORTOGRAM W/LOWER EXTREMITY;  Surgeon: Serafina Mitchell, MD;  Location: [place] CV LAB;  Service: Cardiovascular;  Laterality: Bilateral;  . COLONOSCOPY    . hernia surgury     69 years old  . perineal abscess    . PERIPHERAL VASCULAR CATHETERIZATION N/A 11/23/2014   Procedure: Abdominal Aortogram;  Surgeon: Serafina Mitchell, MD;  Location: [place] CV LAB;  Service: Cardiovascular;  Laterality: N/A;  . PERIPHERAL VASCULAR CATHETERIZATION  11/23/2014   Procedure: Lower Extremity Angiography;  Surgeon: Serafina Mitchell, MD;  Location: [place] CV LAB;  Service: Cardiovascular;;  . PERIPHERAL VASCULAR CATHETERIZATION  11/23/2014   Procedure: Peripheral Vascular Intervention;  Surgeon: Serafina Mitchell, MD;  Location: [place] CV LAB;  Service: Cardiovascular;;  PTA bilateral common iliac PTA/DCB left fem-pop  . POLYPECTOMY    . 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 SP ENDO REPAIR INFRAREN AAA  Current Medications: Current Meds  Medication Sig  . amLODipine (NORVASC) 5 MG tablet TAKE (2) TABLETS BY MOUTH DAILY. (Patient taking differently: Take 10 mg by mouth daily. )  . Ascorbic Acid (VITAMIN C) 1000 MG tablet Take 1,000 mg by mouth daily.  Marland Kitchen atorvastatin (LIPITOR) 40 MG tablet TAKE 1 TABLET ONCE DAILY. (Patient taking differently: Take 40 mg by mouth daily. )  . cetirizine (ZYRTEC) 10 MG tablet Take 1 tablet (10 mg total) by  mouth daily.  . Cholecalciferol (VITAMIN D) 50 MCG (2000 UT) tablet Take 2,000 Units by mouth daily.  . clopidogrel (PLAVIX) 75 MG tablet TAKE 1 TABLET EACH DAY. (Patient taking differently: Take 75 mg by mouth daily. )  . docusate sodium (COLACE) 100 MG capsule Take 100 mg by mouth 2 (two) times daily.   Marland Kitchen ELIQUIS 5 MG TABS tablet TAKE 1 TABLET BY MOUTH TWICE DAILY.  . famotidine (PEPCID) 20 MG tablet Take 20 mg by mouth 2 (two) times daily.  . folic acid (FOLVITE) 062 MCG tablet Take 800 mcg by mouth daily.  . furosemide (LASIX) 40 MG tablet Take 40 mg by mouth daily.  Marland Kitchen gemfibrozil (LOPID) 600 MG tablet TAKE 2 TABLETS BY MOUTH ONCE DAILY. (Patient taking differently: Take 600 mg by mouth in the morning and at bedtime. )  . metFORMIN (GLUCOPHAGE-XR) 500 MG 24 hr tablet TAKE 4 TABLETS EVERY AM.  . metroNIDAZOLE (METROGEL) 0.75 % gel Apply 1 application topically every other day.   . Multiple Vitamin (MULTIVITAMIN) capsule Take 1 capsule by mouth daily.   Marland Kitchen olmesartan (BENICAR) 40 MG tablet Take 1 tablet (40 mg total) by mouth daily. Follow-up appt due in August must see provider for future refills  . Omega-3 Fatty Acids (FISH OIL) 1000 MG CAPS Take 1,000 mg by mouth daily.  . pioglitazone (ACTOS) 30 MG tablet TAKE 1 TABLET EACH DAY. (Patient taking differently: Take 30 mg by mouth daily. )  . psyllium (FIBER LAXATIVE) 0.52 g capsule Take 3.36 g by mouth daily with lunch.  . triamcinolone (NASACORT) 55 MCG/ACT AERO nasal inhaler Place 2 sprays into the nose daily. (Patient taking differently: Place 1 spray into the nose daily as needed (allergies). )     Allergies:   Latex   Social History   Socioeconomic History  . Marital status: Married    Spouse name: Not on file  . Number of children: 1  . Years of education: Not on file  . Highest education level: Not on file  Occupational History  . Occupation: dsiabled due to stroke    Employer: RETIRED  Tobacco Use  . Smoking status: Former  Smoker    Types: Cigarettes    Quit date: 01/14/1992    Years since quitting: 27.8  . Smokeless tobacco: Never Used  Vaping Use  . Vaping Use: Never used  Substance and Sexual Activity  . Alcohol use: Yes    Alcohol/week: 2.0 standard drinks    Types: 2 Glasses of wine per week    Comment: wine daily  . Drug use: No  . Sexual activity: Not on file  Other Topics Concern  . Not on file  Social History Narrative   2 cups of coffee in the morning and tea throughout the day.   Social Determinants of Health   Financial Resource Strain:   . Difficulty of Paying Living Expenses:   Food Insecurity:   . Worried About Charity fundraiser in the Last Year:   . Ran  Out of Food in the Last Year:   Transportation Needs:   . Lack of Transportation (Medical):   Marland Kitchen Lack of Transportation (Non-Medical):   Physical Activity:   . Days of Exercise per Week:   . Minutes of Exercise per Session:   Stress:   . Feeling of Stress :   Social Connections:   . Frequency of Communication with Friends and Family:   . Frequency of Social Gatherings with Friends and Family:   . Attends Religious Services:   . Active Member of Clubs or Organizations:   . Attends Archivist Meetings:   Marland Kitchen Marital Status:      Family History:  The patient's family history includes Aortic aneurysm in his mother; Cancer in his brother.   ROS:   Please see the history of present illness.    ROS All other systems reviewed and are negative.   PHYSICAL EXAM:   VS:  BP (!) 138/74   Pulse 52   Ht 5\' 10"  (1.778 m)   Wt (!) 272 lb 9.6 oz (123.7 kg)   BMI 39.11 kg/m   Physical Exam  GEN: Obese, in no acute distress  Neck: no JVD, carotid bruits, or masses Cardiac: Irregular irregular 1/6 systolic murmur at the apex Respiratory:  clear to auscultation bilaterally, normal work of breathing GI: soft, nontender, nondistended, + BS Ext: Trace of left ankle edema without cyanosis, clubbing,  decreased distal pulses  bilaterally Neuro:  Alert and Oriented x 3 Psych: euthymic mood, full affect  Wt Readings from Last 3 Encounters:  11/30/19 (!) 272 lb 9.6 oz (123.7 kg)  10/18/19 275 lb (124.7 kg)  10/05/19 265 lb (120.2 kg)      Studies/Labs Reviewed:   EKG:  EKG is ordered today.  The ekg ordered today demonstrates Afib at 52/m  Recent Labs: 07/13/2019: ALT 12; Platelets 250.0; TSH 1.97 10/05/2019: BUN 34; Creatinine, Ser 1.30; Hemoglobin 11.9; Potassium 4.1; Sodium 141   Lipid Panel    Component Value Date/Time   CHOL 128 07/13/2019 1009   TRIG 178.0 (H) 07/13/2019 1009   HDL 40.70 07/13/2019 1009   CHOLHDL 3 07/13/2019 1009   VLDL 35.6 07/13/2019 1009   LDLCALC 52 07/13/2019 1009   LDLDIRECT 56.0 12/22/2018 0819    Additional studies/ records that were reviewed today include:   Echo 11/2014 Study Conclusions   - Left ventricle: The cavity size was normal. Wall thickness was    normal. Systolic function was normal. The estimated ejection    fraction was in the range of 55% to 60%. Wall motion was normal;    there were no regional wall motion abnormalities.  - Ascending aorta: The ascending aorta was mildly dilated.  - Mitral valve: There was mild regurgitation.  - Left atrium: The atrium was moderately dilated.  - Right atrium: The atrium was mildly dilated.   Impressions:   - Normal LV function; biatrial enlargement; mild MR; trace TR.     ASSESSMENT:    1. Longstanding persistent atrial fibrillation ([place])   2. Essential hypertension   3. Hyperlipidemia, unspecified hyperlipidemia type   4. Non-rheumatic mitral regurgitation   5. Mitral valve disorder   6. PAD (peripheral artery disease) ([place])   7. Morbid obesity ([place])      PLAN:  In order of problems listed above:  Persistent atrial fibrillation on Eliquis.  Not on AV nodal blocking agents. Crt 1.30 10/05/19. No bleeding problems on eliquis and plavix. eliquis costing him over $400/3 months  since in the donut hole.  Will try to give him samples.  Essential hypertension BP controlled  Hyperlipidemia followed by PCP LDL 52 trig 178 07/13/19  Mitral regurgitation echo was supposed to be done last year but was never done. Will reschedule   PAD status post AAA repair 2012 as well as peripheral vascular stenting followed by Dr. Trula Slade  Obesity recommend weight loss and regular exercise as his knee and PAD allow.     Medication Adjustments/Labs and Tests Ordered: Current medicines are reviewed at length with the patient today.  Concerns regarding medicines are outlined above.  Medication changes, Labs and Tests ordered today are listed in the Patient Instructions below. Patient Instructions  Medication Instructions:  Your physician recommends that you continue on your current medications as directed. Please refer to the Current Medication list given to you today.  *If you need a refill on your cardiac medications before your next appointment, please call your pharmacy*   Lab Work: None If you have labs (blood work) drawn today and your tests are completely normal, you will receive your results only by: Marland Kitchen MyChart Message (if you have MyChart) OR . A paper copy in the mail If you have any lab test that is abnormal or we need to change your treatment, we will call you to review the results.   Testing/Procedures: Your physician has requested that you have an echocardiogram. Echocardiography is a painless test that uses sound waves to create images of your heart. It provides your doctor with information about the size and shape of your heart and how well your heart's chambers and valves are working. This procedure takes approximately one hour. There are no restrictions for this procedure.     Follow-Up: At [facility], you and your health needs are our priority.  As part of our continuing mission to provide you with exceptional heart care, we have created designated Provider Care Teams.  These Care  Teams include your primary Cardiologist (physician) and Advanced Practice Providers (APPs -  Physician Assistants and Nurse Practitioners) who all work together to provide you with the care you need, when you need it.  We recommend signing up for the patient portal called "MyChart".  Sign up information is provided on this After Visit Summary.  MyChart is used to connect with patients for Virtual Visits (Telemedicine).  Patients are able to view lab/test results, encounter notes, upcoming appointments, etc.  Non-urgent messages can be sent to your provider as well.   To learn more about what you can do with MyChart, go to [email or web address].    Your next appointment:   6 month(s)  The format for your next appointment:   In Person  Provider:   You may see Lauree Chandler, MD or one of the following Advanced Practice Providers on your designated Care Team:    Melina Copa, PA-C  Ermalinda Barrios, PA-C    Other Instructions Please call [employer] to see if you qualify for any assistance for your Eliquis     Weston Brass Ermalinda Barrios, Hershal Coria  11/30/2019 10:15 AM    [address], [place], [place]  [address] Phone: [phone]; Fax: [phone]

## 2019-11-30 ENCOUNTER — Other Ambulatory Visit: Payer: Self-pay

## 2019-11-30 ENCOUNTER — Encounter: Payer: Self-pay | Admitting: Physician Assistant

## 2019-11-30 ENCOUNTER — Ambulatory Visit (INDEPENDENT_AMBULATORY_CARE_PROVIDER_SITE_OTHER): Payer: Medicare Other | Admitting: Physician Assistant

## 2019-11-30 VITALS — BP 138/74 | HR 52 | Ht 70.0 in | Wt 272.6 lb

## 2019-11-30 DIAGNOSIS — I779 Disorder of arteries and arterioles, unspecified: Secondary | ICD-10-CM | POA: Diagnosis not present

## 2019-11-30 DIAGNOSIS — I4811 Longstanding persistent atrial fibrillation: Secondary | ICD-10-CM

## 2019-11-30 DIAGNOSIS — I10 Essential (primary) hypertension: Secondary | ICD-10-CM

## 2019-11-30 DIAGNOSIS — I059 Rheumatic mitral valve disease, unspecified: Secondary | ICD-10-CM | POA: Diagnosis not present

## 2019-11-30 DIAGNOSIS — E785 Hyperlipidemia, unspecified: Secondary | ICD-10-CM

## 2019-11-30 DIAGNOSIS — I340 Nonrheumatic mitral (valve) insufficiency: Secondary | ICD-10-CM

## 2019-11-30 DIAGNOSIS — I739 Peripheral vascular disease, unspecified: Secondary | ICD-10-CM | POA: Diagnosis not present

## 2019-11-30 NOTE — Patient Instructions (Addendum)
Medication Instructions:  Your physician recommends that you continue on your current medications as directed. Please refer to the Current Medication list given to you today.  *If you need a refill on your cardiac medications before your next appointment, please call your pharmacy*   Lab Work: None If you have labs (blood work) drawn today and your tests are completely normal, you will receive your results only by: Marland Kitchen MyChart Message (if you have MyChart) OR . A paper copy in the mail If you have any lab test that is abnormal or we need to change your treatment, we will call you to review the results.   Testing/Procedures: Your physician has requested that you have an echocardiogram. Echocardiography is a painless test that uses sound waves to create images of your heart. It provides your doctor with information about the size and shape of your heart and how well your heart's chambers and valves are working. This procedure takes approximately one hour. There are no restrictions for this procedure.     Follow-Up: At [facility], you and your health needs are our priority.  As part of our continuing mission to provide you with exceptional heart care, we have created designated Provider Care Teams.  These Care Teams include your primary Cardiologist (physician) and Advanced Practice Providers (APPs -  Physician Assistants and Nurse Practitioners) who all work together to provide you with the care you need, when you need it.  We recommend signing up for the patient portal called "MyChart".  Sign up information is provided on this After Visit Summary.  MyChart is used to connect with patients for Virtual Visits (Telemedicine).  Patients are able to view lab/test results, encounter notes, upcoming appointments, etc.  Non-urgent messages can be sent to your provider as well.   To learn more about what you can do with MyChart, go to [email or web address].    Your next appointment:   6  month(s)  The format for your next appointment:   In Person  Provider:   You may see Lauree Chandler, MD or one of the following Advanced Practice Providers on your designated Care Team:    Melina Copa, PA-C  Ermalinda Barrios, PA-C    Other Instructions Please call [employer] to see if you qualify for any assistance for your Eliquis

## 2019-12-02 ENCOUNTER — Encounter: Payer: Self-pay | Admitting: Gastroenterology

## 2019-12-12 ENCOUNTER — Other Ambulatory Visit: Payer: Self-pay | Admitting: Internal Medicine

## 2020-01-13 ENCOUNTER — Encounter: Payer: Self-pay | Admitting: Internal Medicine

## 2020-01-13 ENCOUNTER — Ambulatory Visit (INDEPENDENT_AMBULATORY_CARE_PROVIDER_SITE_OTHER): Payer: Medicare Other | Admitting: Internal Medicine

## 2020-01-13 ENCOUNTER — Other Ambulatory Visit: Payer: Self-pay

## 2020-01-13 VITALS — BP 140/62 | HR 60 | Temp 97.9°F | Ht 70.0 in | Wt 271.0 lb

## 2020-01-13 DIAGNOSIS — R972 Elevated prostate specific antigen [PSA]: Secondary | ICD-10-CM

## 2020-01-13 DIAGNOSIS — I10 Essential (primary) hypertension: Secondary | ICD-10-CM

## 2020-01-13 DIAGNOSIS — N183 Chronic kidney disease, stage 3 unspecified: Secondary | ICD-10-CM

## 2020-01-13 DIAGNOSIS — E1121 Type 2 diabetes mellitus with diabetic nephropathy: Secondary | ICD-10-CM

## 2020-01-13 DIAGNOSIS — Z23 Encounter for immunization: Secondary | ICD-10-CM

## 2020-01-13 DIAGNOSIS — I779 Disorder of arteries and arterioles, unspecified: Secondary | ICD-10-CM

## 2020-01-13 DIAGNOSIS — D509 Iron deficiency anemia, unspecified: Secondary | ICD-10-CM | POA: Diagnosis not present

## 2020-01-13 DIAGNOSIS — E559 Vitamin D deficiency, unspecified: Secondary | ICD-10-CM

## 2020-01-13 NOTE — Patient Instructions (Addendum)
You had the pneumovax pneumonia shot today   Please continue all other medications as before, and refills have been done if requested.  Please have the pharmacy call with any other refills you may need.  Please continue your efforts at being more active, low cholesterol diet, and weight control.  Please keep your appointments with your specialists as you may have planned  Please go to the LAB at the blood drawing area for the tests to be done  You will be contacted by phone if any changes need to be made immediately.  Otherwise, you will receive a letter about your results with an explanation, but please check with MyChart first.  Please remember to sign up for MyChart if you have not done so, as this will be important to you in the future with finding out test results, communicating by private email, and scheduling acute appointments online when needed.  Please make an Appointment to return in 6 months, or sooner if needed

## 2020-01-13 NOTE — Progress Notes (Signed)
Subjective:    Patient ID: David [name], male    DOB: May 12, 1950, 69 y.o.   MRN: [id number]  HPI  Here to f/u; overall doing ok,  Pt denies chest pain, increasing sob or doe, wheezing, orthopnea, PND, increased LE swelling, palpitations, dizziness or syncope.  Pt denies new neurological symptoms such as new headache, or facial or extremity weakness or numbness.  Pt denies polydipsia, polyuria, or low sugar episode.  Pt states overall good compliance with meds, mostly trying to follow appropriate diet, with wt overall stable,  but little exercise however. Plans to see eye doctor soon for yearly exam  Has f/u appt with Dr Fuller Plan end of sept 2021 .  No overt bleeding Past Medical History:  Diagnosis Date  . AAA (abdominal aortic aneurysm) ([place])   . 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 Anticardiolipin antibody positive   . Anxiety   . ANXIETY 07/19/2008  . CEREBROVASCULAR ACCIDENT, HX OF 07/19/2008  . CVA (cerebral vascular accident) ([place]) 2003  . Depression   . DEPRESSION 07/19/2008  . Diabetes mellitus    type II  . DIABETES MELLITUS, TYPE II 01/18/2008  . Diverticulosis   . GERD 07/19/2008  . GERD (gastroesophageal reflux disease)   . Hemorrhoids   . Hx of adenomatous colonic polyps   . Hyperlipidemia   . HYPERLIPIDEMIA 07/19/2008  . Hypertension   . HYPERTENSION 07/31/2009  . Increased prostate specific antigen (PSA) velocity 09/13/2010  . Internal hemorrhoids   . Perineal abscess   . Peripheral vascular disease ([place])    peripheral vascular disease/carotid artery <100%  . PERSONAL HX COLONIC POLYPS 12/10/2007  . Unspecified Peripheral Vascular Disease 01/18/2008  . Varicose veins    Past Surgical History:  Procedure Laterality Date  . 7 abdominal hernia repairs  05/31/10  . ABDOMINAL AORTAGRAM N/A 01/12/2013   Procedure: ABDOMINAL Maxcine Ham;  Surgeon: Serafina Mitchell, MD;  Location: [facility] CATH LAB;  Service: Cardiovascular;  Laterality: N/A;  . ABDOMINAL AORTIC  ANEURYSM REPAIR    . ABDOMINAL AORTOGRAM W/LOWER EXTREMITY Bilateral 10/05/2019   Procedure: ABDOMINAL AORTOGRAM W/LOWER EXTREMITY;  Surgeon: Serafina Mitchell, MD;  Location: [place] CV LAB;  Service: Cardiovascular;  Laterality: Bilateral;  . COLONOSCOPY    . hernia surgury     69 years old  . perineal abscess    . PERIPHERAL VASCULAR CATHETERIZATION N/A 11/23/2014   Procedure: Abdominal Aortogram;  Surgeon: Serafina Mitchell, MD;  Location: [place] CV LAB;  Service: Cardiovascular;  Laterality: N/A;  . PERIPHERAL VASCULAR CATHETERIZATION  11/23/2014   Procedure: Lower Extremity Angiography;  Surgeon: Serafina Mitchell, MD;  Location: [place] CV LAB;  Service: Cardiovascular;;  . PERIPHERAL VASCULAR CATHETERIZATION  11/23/2014   Procedure: Peripheral Vascular Intervention;  Surgeon: Serafina Mitchell, MD;  Location: [place] CV LAB;  Service: Cardiovascular;;  PTA bilateral common iliac PTA/DCB left fem-pop  . POLYPECTOMY    . 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 SP ENDO REPAIR INFRAREN AAA      reports that he quit smoking about 28 years ago. His smoking use included cigarettes. He has never used smokeless tobacco. He reports current alcohol use of about 2.0 standard drinks of alcohol per week. He reports that he does not use drugs. family history includes Aortic aneurysm in his mother; Cancer in his brother. Allergies  Allergen Reactions  . Latex Rash   Current Outpatient Medications on File Prior to Visit  Medication Sig Dispense Refill  . amLODipine (NORVASC) 5 MG tablet TAKE (2) TABLETS BY MOUTH DAILY. (Patient taking differently: Take 10 mg by mouth daily. ) 60 tablet 0  . Ascorbic Acid (VITAMIN C) 1000 MG tablet Take 1,000 mg by mouth daily.    Marland Kitchen atorvastatin (LIPITOR) 40 MG tablet TAKE 1 TABLET ONCE DAILY. (Patient taking differently: Take 40 mg by mouth daily. ) 90 tablet 3  . cetirizine (ZYRTEC) 10 MG tablet Take 1 tablet (10 mg total) by mouth daily. 90 tablet  3  . Cholecalciferol (VITAMIN D) 50 MCG (2000 UT) tablet Take 2,000 Units by mouth daily.    . clopidogrel (PLAVIX) 75 MG tablet TAKE 1 TABLET EACH DAY. (Patient taking differently: Take 75 mg by mouth daily. ) 90 tablet 3  . docusate sodium (COLACE) 100 MG capsule Take 100 mg by mouth 2 (two) times daily.     Marland Kitchen ELIQUIS 5 MG TABS tablet TAKE 1 TABLET BY MOUTH TWICE DAILY. 180 tablet 1  . famotidine (PEPCID) 20 MG tablet Take 20 mg by mouth 2 (two) times daily.    . folic acid (FOLVITE) 976 MCG tablet Take 800 mcg by mouth daily.    . furosemide (LASIX) 40 MG tablet Take 40 mg by mouth daily.    Marland Kitchen gemfibrozil (LOPID) 600 MG tablet TAKE 2 TABLETS BY MOUTH ONCE DAILY. (Patient taking differently: Take 600 mg by mouth in the morning and at bedtime. ) 180 tablet 3  . metFORMIN (GLUCOPHAGE-XR) 500 MG 24 hr tablet TAKE 4 TABLETS EVERY AM. 360 tablet 2  . metroNIDAZOLE (METROGEL) 0.75 % gel Apply 1 application topically every other day.     . Multiple Vitamin (MULTIVITAMIN) capsule Take 1 capsule by mouth daily.     Marland Kitchen olmesartan (BENICAR) 40 MG tablet Take 1 tablet (40 mg total) by mouth daily. Follow-up appt due in August must see provider for future refills 90 tablet 0  . Omega-3 Fatty Acids (FISH OIL) 1000 MG CAPS Take 1,000 mg by mouth daily.    . pioglitazone (ACTOS) 30 MG tablet TAKE 1 TABLET EACH DAY. 90 tablet 0  . psyllium (FIBER LAXATIVE) 0.52 g capsule Take 3.36 g by mouth daily with lunch.    . triamcinolone (NASACORT) 55 MCG/ACT AERO nasal inhaler Place 2 sprays into the nose daily. (Patient taking differently: Place 1 spray into the nose daily as needed (allergies). ) 1 Inhaler 12  . SHINGRIX injection      No current facility-administered medications on file prior to visit.   Review of Systems All otherwise neg per pt    Objective:   Physical Exam BP 140/62 (BP Location: Left Arm, Patient Position: Sitting, Cuff Size: Large)   Pulse 60   Temp 97.9 F (36.6 C) (Oral)   Ht 5\' 10"   (1.778 m)   Wt 271 lb (122.9 kg)   SpO2 99%   BMI 38.88 kg/m  VS noted,  Constitutional: Pt appears in NAD HENT: Head: NCAT.  Right Ear: External ear normal.  Left Ear: External ear normal.  Eyes: . Pupils are equal, round, and reactive to light. Conjunctivae and EOM are normal Nose: without d/c or deformity Neck: Neck supple. Gross normal ROM Cardiovascular: Normal rate and regular rhythm.   Pulmonary/Chest: Effort normal and breath sounds without rales or wheezing.  Abd:  Soft, NT, ND, + BS, no organomegaly Neurological: Pt is alert. At baseline orientation, motor grossly intact Skin: Skin is warm. No rashes, other new lesions, no LE  edema Psychiatric: Pt behavior is normal without agitation  All otherwise neg per pt Lab Results  Component Value Date   WBC 4.5 01/13/2020   HGB 12.2 (L) 01/13/2020   HCT 35.2 (L) 01/13/2020   PLT 220 01/13/2020   GLUCOSE 123 (H) 01/13/2020   CHOL 128 07/13/2019   TRIG 178.0 (H) 07/13/2019   HDL 40.70 07/13/2019   LDLDIRECT 56.0 12/22/2018   LDLCALC 52 07/13/2019   ALT 12 07/13/2019   AST 19 07/13/2019   NA 139 01/13/2020   K 4.2 01/13/2020   CL 103 01/13/2020   CREATININE 1.15 01/13/2020   BUN 34 (H) 01/13/2020   CO2 24 01/13/2020   TSH 1.97 07/13/2019   PSA 1.8 01/13/2020   INR 1.17 10/16/2017   HGBA1C 5.3 01/13/2020   MICROALBUR 173.4 (H) 11/12/2016      Assessment & Plan:

## 2020-01-14 ENCOUNTER — Encounter: Payer: Self-pay | Admitting: Internal Medicine

## 2020-01-14 LAB — IRON, TOTAL/TOTAL IRON BINDING CAP
%SAT: 23 % (calc) (ref 20–48)
Iron: 101 ug/dL (ref 50–180)
TIBC: 441 mcg/dL (calc) — ABNORMAL HIGH (ref 250–425)

## 2020-01-14 LAB — CBC WITH DIFFERENTIAL/PLATELET
Absolute Monocytes: 482 cells/uL (ref 200–950)
Basophils Absolute: 81 cells/uL (ref 0–200)
Basophils Relative: 1.8 %
Eosinophils Absolute: 410 cells/uL (ref 15–500)
Eosinophils Relative: 9.1 %
HCT: 35.2 % — ABNORMAL LOW (ref 38.5–50.0)
Hemoglobin: 12.2 g/dL — ABNORMAL LOW (ref 13.2–17.1)
Lymphs Abs: 891 cells/uL (ref 850–3900)
MCH: 33.3 pg — ABNORMAL HIGH (ref 27.0–33.0)
MCHC: 34.7 g/dL (ref 32.0–36.0)
MCV: 96.2 fL (ref 80.0–100.0)
MPV: 9.6 fL (ref 7.5–12.5)
Monocytes Relative: 10.7 %
Neutro Abs: 2637 cells/uL (ref 1500–7800)
Neutrophils Relative %: 58.6 %
Platelets: 220 10*3/uL (ref 140–400)
RBC: 3.66 10*6/uL — ABNORMAL LOW (ref 4.20–5.80)
RDW: 12.1 % (ref 11.0–15.0)
Total Lymphocyte: 19.8 %
WBC: 4.5 10*3/uL (ref 3.8–10.8)

## 2020-01-14 LAB — PSA: PSA: 1.8 ng/mL (ref <=4.0)

## 2020-01-14 LAB — BASIC METABOLIC PANEL WITH GFR
BUN/Creatinine Ratio: 30 (calc) — ABNORMAL HIGH (ref 6–22)
BUN: 34 mg/dL — ABNORMAL HIGH (ref 7–25)
CO2: 24 mmol/L (ref 20–32)
Calcium: 10.4 mg/dL — ABNORMAL HIGH (ref 8.6–10.3)
Chloride: 103 mmol/L (ref 98–110)
Creat: 1.15 mg/dL (ref 0.70–1.18)
GFR, Est African American: 74 mL/min/{1.73_m2} (ref >=60)
GFR, Est Non African American: 64 mL/min/{1.73_m2} (ref >=60)
Glucose, Bld: 123 mg/dL — ABNORMAL HIGH (ref 65–99)
Potassium: 4.2 mmol/L (ref 3.5–5.3)
Sodium: 139 mmol/L (ref 135–146)

## 2020-01-14 LAB — FERRITIN: Ferritin: 63 ng/mL (ref 24–380)

## 2020-01-14 LAB — HEMOGLOBIN A1C
Hgb A1c MFr Bld: 5.3 % of total Hgb (ref <5.7)
Mean Plasma Glucose: 105 (calc)
eAG (mmol/L): 5.8 (calc)

## 2020-01-14 LAB — TRANSFERRIN: Transferrin: 339 mg/dL (ref 188–341)

## 2020-01-15 ENCOUNTER — Encounter: Payer: Self-pay | Admitting: Internal Medicine

## 2020-01-15 DIAGNOSIS — N189 Chronic kidney disease, unspecified: Secondary | ICD-10-CM | POA: Insufficient documentation

## 2020-01-15 DIAGNOSIS — E559 Vitamin D deficiency, unspecified: Secondary | ICD-10-CM | POA: Insufficient documentation

## 2020-01-15 NOTE — Assessment & Plan Note (Signed)
Cont oral replacement 

## 2020-01-15 NOTE — Assessment & Plan Note (Signed)
stable overall by history and exam, recent data reviewed with pt, and pt to continue medical treatment as before,  to f/u any worsening symptoms or concerns  

## 2020-01-15 NOTE — Assessment & Plan Note (Addendum)
For f/u iron, cbc  I spent 31 minutes in preparing to see the patient by review of recent labs, imaging and procedures, obtaining and reviewing separately obtained history, communicating with the patient and family or caregiver, ordering medications, tests or procedures, and documenting clinical information in the EHR including the differential Dx, treatment, and any further evaluation and other management of anemia, vit d def, ckd, dm, dm, htn

## 2020-01-15 NOTE — Assessment & Plan Note (Signed)
For fu psa

## 2020-01-18 ENCOUNTER — Other Ambulatory Visit: Payer: Self-pay

## 2020-01-18 ENCOUNTER — Ambulatory Visit (HOSPITAL_COMMUNITY): Payer: Medicare Other | Attending: Cardiovascular Disease

## 2020-01-18 ENCOUNTER — Encounter: Payer: Self-pay | Admitting: Physician Assistant

## 2020-01-18 DIAGNOSIS — I340 Nonrheumatic mitral (valve) insufficiency: Secondary | ICD-10-CM

## 2020-01-18 LAB — ECHOCARDIOGRAM COMPLETE
AR max vel: 1.86 cm2
AV Area VTI: 1.79 cm2
AV Area mean vel: 1.98 cm2
AV Mean grad: 9.2 mmHg
AV Peak grad: 18.9 mmHg
Ao pk vel: 2.18 m/s
Area-P 1/2: 5.54 cm2
S' Lateral: 3.7 cm

## 2020-01-18 MED ORDER — PERFLUTREN LIPID MICROSPHERE
1.0000 mL | INTRAVENOUS | Status: AC | PRN
  Administered 2020-01-18: 1 mL via INTRAVENOUS

## 2020-02-03 ENCOUNTER — Ambulatory Visit (INDEPENDENT_AMBULATORY_CARE_PROVIDER_SITE_OTHER): Payer: Medicare Other | Admitting: Gastroenterology

## 2020-02-03 ENCOUNTER — Encounter: Payer: Self-pay | Admitting: Gastroenterology

## 2020-02-03 VITALS — BP 138/70 | HR 58 | Ht 70.0 in | Wt 275.4 lb

## 2020-02-03 DIAGNOSIS — I779 Disorder of arteries and arterioles, unspecified: Secondary | ICD-10-CM

## 2020-02-03 DIAGNOSIS — Z7901 Long term (current) use of anticoagulants: Secondary | ICD-10-CM

## 2020-02-03 DIAGNOSIS — Z86010 Personal history of colonic polyps: Secondary | ICD-10-CM | POA: Diagnosis not present

## 2020-02-03 NOTE — Progress Notes (Signed)
History of Present Illness: This is a 69 year old male referred by Biagio Borg, MD for the evaluation of personal history of adenomatous colon polyps.  He is maintained on Plavix and Eliquis.  Last colonoscopy in 2016 was free of adenomatous colon polyps.  Colonoscopy in 2006 did have adenomatous colon polyps.  He has GERD that is well controlled on famotidine.  No other gastrointestinal complaints. Denies weight loss, abdominal pain, constipation, diarrhea, change in stool caliber, melena, hematochezia, nausea, vomiting, dysphagia, chest pain.   Colonoscopy 10/2014 1. Two sessile polyps in the sigmoid colon and transverse colon; polypectomies performed with a cold snare 2. Moderate diverticulosis in the descending colon and sigmoid colon 3. Mild diverticulosis in the transverse colon 4. Grade Il internal hemorrhoids and moderate external hemorrhoids (Hyperplastic polyps)     Allergies  Allergen Reactions  . Latex Rash   Outpatient Medications Prior to Visit  Medication Sig Dispense Refill  . amLODipine (NORVASC) 5 MG tablet TAKE (2) TABLETS BY MOUTH DAILY. (Patient taking differently: Take 10 mg by mouth daily. ) 60 tablet 0  . Ascorbic Acid (VITAMIN C) 1000 MG tablet Take 1,000 mg by mouth daily.    Marland Kitchen atorvastatin (LIPITOR) 40 MG tablet TAKE 1 TABLET ONCE DAILY. (Patient taking differently: Take 40 mg by mouth daily. ) 90 tablet 3  . cetirizine (ZYRTEC) 10 MG tablet Take 1 tablet (10 mg total) by mouth daily. 90 tablet 3  . Cholecalciferol (VITAMIN D) 50 MCG (2000 UT) tablet Take 2,000 Units by mouth daily.    . clopidogrel (PLAVIX) 75 MG tablet TAKE 1 TABLET EACH DAY. (Patient taking differently: Take 75 mg by mouth daily. ) 90 tablet 3  . docusate sodium (COLACE) 100 MG capsule Take 100 mg by mouth 2 (two) times daily.     Marland Kitchen ELIQUIS 5 MG TABS tablet TAKE 1 TABLET BY MOUTH TWICE DAILY. 180 tablet 1  . famotidine (PEPCID) 20 MG tablet Take 20 mg by mouth 2 (two) times daily.    .  folic acid (FOLVITE) 161 MCG tablet Take 800 mcg by mouth daily.    . furosemide (LASIX) 40 MG tablet Take 40 mg by mouth daily.    Marland Kitchen gemfibrozil (LOPID) 600 MG tablet TAKE 2 TABLETS BY MOUTH ONCE DAILY. (Patient taking differently: Take 600 mg by mouth in the morning and at bedtime. ) 180 tablet 3  . metFORMIN (GLUCOPHAGE-XR) 500 MG 24 hr tablet TAKE 4 TABLETS EVERY AM. 360 tablet 2  . metroNIDAZOLE (METROGEL) 0.75 % gel Apply 1 application topically every other day.     . Multiple Vitamin (MULTIVITAMIN) capsule Take 1 capsule by mouth daily.     Marland Kitchen olmesartan (BENICAR) 40 MG tablet Take 1 tablet (40 mg total) by mouth daily. Follow-up appt due in August must see provider for future refills 90 tablet 0  . Omega-3 Fatty Acids (FISH OIL) 1000 MG CAPS Take 1,000 mg by mouth daily.    . pioglitazone (ACTOS) 30 MG tablet TAKE 1 TABLET EACH DAY. 90 tablet 0  . psyllium (FIBER LAXATIVE) 0.52 g capsule Take 3.36 g by mouth daily with lunch.    Marland Kitchen SHINGRIX injection     . triamcinolone (NASACORT) 55 MCG/ACT AERO nasal inhaler Place 2 sprays into the nose daily. (Patient taking differently: Place 1 spray into the nose daily as needed (allergies). ) 1 Inhaler 12   No facility-administered medications prior to visit.   Past Medical History:  Diagnosis Date  . AAA (abdominal  aortic aneurysm) ([place])   . Adenomatous colon polyp 04/2005  . 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 Anticardiolipin antibody positive   . Anxiety   . ANXIETY 07/19/2008  . CEREBROVASCULAR ACCIDENT, HX OF 07/19/2008  . CVA (cerebral vascular accident) ([place]) 2003  . Depression   . DEPRESSION 07/19/2008  . Diabetes mellitus    type II  . DIABETES MELLITUS, TYPE II 01/18/2008  . Diverticulosis   . GERD 07/19/2008  . GERD (gastroesophageal reflux disease)   . Hemorrhoids   . Hx of adenomatous colonic polyps   . Hyperlipidemia   . HYPERLIPIDEMIA 07/19/2008  . Hypertension   . HYPERTENSION 07/31/2009  . Increased  prostate specific antigen (PSA) velocity 09/13/2010  . Internal hemorrhoids   . Mitral regurgitation    Echo 9/21: EF 55-60, no RWMA, normal RVSF, mild-moderate LAE, mild-moderate MR, mild aortic stenosis (mean gradient 9.2 mmHg)  . Perineal abscess   . Peripheral vascular disease ([place])    peripheral vascular disease/carotid artery <100%  . PERSONAL HX COLONIC POLYPS 12/10/2007  . Unspecified Peripheral Vascular Disease 01/18/2008  . Varicose veins    Past Surgical History:  Procedure Laterality Date  . 7 abdominal hernia repairs  05/31/10  . ABDOMINAL AORTAGRAM N/A 01/12/2013   Procedure: ABDOMINAL Maxcine Ham;  Surgeon: Serafina Mitchell, MD;  Location: [facility] CATH LAB;  Service: Cardiovascular;  Laterality: N/A;  . ABDOMINAL AORTIC ANEURYSM REPAIR    . ABDOMINAL AORTOGRAM W/LOWER EXTREMITY Bilateral 10/05/2019   Procedure: ABDOMINAL AORTOGRAM W/LOWER EXTREMITY;  Surgeon: Serafina Mitchell, MD;  Location: [facility] CV LAB;  Service: Cardiovascular;  Laterality: Bilateral;  . COLONOSCOPY    . hernia surgury     69 years old  . perineal abscess    . PERIPHERAL VASCULAR CATHETERIZATION N/A 11/23/2014   Procedure: Abdominal Aortogram;  Surgeon: Serafina Mitchell, MD;  Location: [facility] CV LAB;  Service: Cardiovascular;  Laterality: N/A;  . PERIPHERAL VASCULAR CATHETERIZATION  11/23/2014   Procedure: Lower Extremity Angiography;  Surgeon: Serafina Mitchell, MD;  Location: [facility] CV LAB;  Service: Cardiovascular;;  . PERIPHERAL VASCULAR CATHETERIZATION  11/23/2014   Procedure: Peripheral Vascular Intervention;  Surgeon: Serafina Mitchell, MD;  Location: [facility] CV LAB;  Service: Cardiovascular;;  PTA bilateral common iliac PTA/DCB left fem-pop  . POLYPECTOMY    . 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 SP ENDO REPAIR INFRAREN AAA     Social History   Socioeconomic History  . Marital status: Married    Spouse name: Not on file  . Number of children: 1  . Years of education: Not on  file  . Highest education level: Not on file  Occupational History  . Occupation: dsiabled due to stroke    Employer: RETIRED  Tobacco Use  . Smoking status: Former Smoker    Types: Cigarettes    Quit date: 01/14/1992    Years since quitting: 28.0  . Smokeless tobacco: Never Used  Vaping Use  . Vaping Use: Never used  Substance and Sexual Activity  . Alcohol use: Yes    Alcohol/week: 2.0 standard drinks    Types: 2 Glasses of wine per week    Comment: wine daily  . Drug use: No  . Sexual activity: Not on file  Other Topics Concern  . Not on file  Social History Narrative   2 cups of coffee in the morning and tea throughout the day.   Social Determinants of  Health   Financial Resource Strain:   . Difficulty of Paying Living Expenses: Not on file  Food Insecurity:   . Worried About Charity fundraiser in the Last Year: Not on file  . Ran Out of Food in the Last Year: Not on file  Transportation Needs:   . Lack of Transportation (Medical): Not on file  . Lack of Transportation (Non-Medical): Not on file  Physical Activity:   . Days of Exercise per Week: Not on file  . Minutes of Exercise per Session: Not on file  Stress:   . Feeling of Stress : Not on file  Social Connections:   . Frequency of Communication with Friends and Family: Not on file  . Frequency of Social Gatherings with Friends and Family: Not on file  . Attends Religious Services: Not on file  . Active Member of Clubs or Organizations: Not on file  . Attends Archivist Meetings: Not on file  . Marital Status: Not on file   Family History  Problem Relation Age of Onset  . Aortic aneurysm Mother        desceding aoritic aneurysm  . Cancer Brother        Lung cancer  . Colon cancer Neg Hx   . Esophageal cancer Neg Hx   . Stomach cancer Neg Hx   . Rectal cancer Neg Hx       Review of Systems: Pertinent positive and negative review of systems were noted in the above HPI section. All other  review of systems were otherwise negative.   Physical Exam: General: Well developed, well nourished, no acute distress Head: Normocephalic and atraumatic Eyes:  sclerae anicteric, EOMI Ears: Normal auditory acuity Mouth: Not examined, mask on during Covid-19 pandemic Neck: Supple, no masses or thyromegaly Lungs: Clear throughout to auscultation Heart: Regular rate and rhythm; no murmurs, rubs or bruits Abdomen: Soft, non tender and non distended. No masses, hepatosplenomegaly or hernias noted. Normal Bowel sounds Rectal: Not done Musculoskeletal: Symmetrical with no gross deformities  Skin: No lesions on visible extremities Pulses:  Normal pulses noted Extremities: No clubbing, cyanosis, edema or deformities noted Neurological: Alert oriented x 4, grossly nonfocal Cervical Nodes:  No significant cervical adenopathy Inguinal Nodes: No significant inguinal adenopathy Psychological:  Alert and cooperative. Normal mood and affect   Assessment and Recommendations:  1.  Personal history of adenomatous colon polyps with last colonoscopy in 2016 free of precancerous polyps.  He is at higher risk for complications given his need for anticoagulants and antiplatelet agents.  No colorectal complaints or symptoms.  We discussed deferring his surveillance colonoscopy to a 7-year interval which will be June 2023 and he is very comfortable with this decision.     cc: Biagio Borg, MD [id number] [place],  [place] [id number]

## 2020-02-03 NOTE — Patient Instructions (Signed)
We have changed your recall colonoscopy to 10/2021. We will contact you when it gets closer to that time.  Thank you for choosing me and [facility].  Pricilla Riffle. Dagoberto Ligas., MD., [facility] Regal

## 2020-02-16 ENCOUNTER — Ambulatory Visit (INDEPENDENT_AMBULATORY_CARE_PROVIDER_SITE_OTHER): Payer: Medicare Other | Admitting: *Deleted

## 2020-02-16 ENCOUNTER — Other Ambulatory Visit: Payer: Self-pay

## 2020-02-16 DIAGNOSIS — Z23 Encounter for immunization: Secondary | ICD-10-CM | POA: Diagnosis not present

## 2020-03-03 ENCOUNTER — Encounter: Payer: Self-pay | Admitting: Internal Medicine

## 2020-03-03 DIAGNOSIS — E1122 Type 2 diabetes mellitus with diabetic chronic kidney disease: Secondary | ICD-10-CM | POA: Diagnosis not present

## 2020-03-03 DIAGNOSIS — R809 Proteinuria, unspecified: Secondary | ICD-10-CM | POA: Diagnosis not present

## 2020-03-03 DIAGNOSIS — N1831 Chronic kidney disease, stage 3a: Secondary | ICD-10-CM | POA: Diagnosis not present

## 2020-03-03 DIAGNOSIS — E669 Obesity, unspecified: Secondary | ICD-10-CM | POA: Diagnosis not present

## 2020-03-03 DIAGNOSIS — R609 Edema, unspecified: Secondary | ICD-10-CM | POA: Diagnosis not present

## 2020-03-03 DIAGNOSIS — I129 Hypertensive chronic kidney disease with stage 1 through stage 4 chronic kidney disease, or unspecified chronic kidney disease: Secondary | ICD-10-CM | POA: Diagnosis not present

## 2020-03-03 DIAGNOSIS — E559 Vitamin D deficiency, unspecified: Secondary | ICD-10-CM | POA: Diagnosis not present

## 2020-03-08 ENCOUNTER — Encounter: Payer: Self-pay | Admitting: Internal Medicine

## 2020-03-17 ENCOUNTER — Ambulatory Visit: Payer: Medicare Other | Attending: Internal Medicine

## 2020-03-17 ENCOUNTER — Other Ambulatory Visit (HOSPITAL_BASED_OUTPATIENT_CLINIC_OR_DEPARTMENT_OTHER): Payer: Self-pay | Admitting: Internal Medicine

## 2020-03-17 DIAGNOSIS — Z23 Encounter for immunization: Secondary | ICD-10-CM

## 2020-03-17 NOTE — Progress Notes (Signed)
° °  Covid-19 Vaccination Clinic  Name:  David [name]    MRN: [id number] DOB: [birth year]  03/17/2020  [name] was observed post Covid-19 immunization for 15 minutes without incident. He was provided with Vaccine Information Sheet and instruction to access the V-Safe system.   [name] was instructed to call 911 with any severe reactions post vaccine:  Difficulty breathing   Swelling of face and throat   A fast heartbeat   A bad rash all over body   Dizziness and weakness

## 2020-03-20 MED FILL — PFIZER-BIONTECH COVID-19 VA: 30 | 1 days supply | Qty: 0 | Fill #0

## 2020-03-29 DIAGNOSIS — N1831 Chronic kidney disease, stage 3a: Secondary | ICD-10-CM | POA: Diagnosis not present

## 2020-03-29 DIAGNOSIS — E559 Vitamin D deficiency, unspecified: Secondary | ICD-10-CM | POA: Diagnosis not present

## 2020-03-29 DIAGNOSIS — R809 Proteinuria, unspecified: Secondary | ICD-10-CM | POA: Diagnosis not present

## 2020-04-21 DIAGNOSIS — M109 Gout, unspecified: Secondary | ICD-10-CM | POA: Diagnosis not present

## 2020-04-21 DIAGNOSIS — M67371 Transient synovitis, right ankle and foot: Secondary | ICD-10-CM | POA: Diagnosis not present

## 2020-04-21 DIAGNOSIS — M19071 Primary osteoarthritis, right ankle and foot: Secondary | ICD-10-CM | POA: Diagnosis not present

## 2020-04-24 DIAGNOSIS — M7752 Other enthesopathy of left foot: Secondary | ICD-10-CM | POA: Diagnosis not present

## 2020-04-24 DIAGNOSIS — M109 Gout, unspecified: Secondary | ICD-10-CM | POA: Diagnosis not present

## 2020-05-18 ENCOUNTER — Other Ambulatory Visit: Payer: Self-pay | Admitting: Cardiovascular Disease

## 2020-05-18 DIAGNOSIS — I4819 Other persistent atrial fibrillation: Secondary | ICD-10-CM

## 2020-05-19 NOTE — Telephone Encounter (Signed)
Pt's age 70, wt 124.9 kg, SCr 1.15, CrCl 104.08, last ov w/ ML 11/30/19.

## 2020-05-24 ENCOUNTER — Other Ambulatory Visit: Payer: Self-pay

## 2020-05-24 DIAGNOSIS — I6523 Occlusion and stenosis of bilateral carotid arteries: Secondary | ICD-10-CM

## 2020-05-24 DIAGNOSIS — I739 Peripheral vascular disease, unspecified: Secondary | ICD-10-CM

## 2020-05-29 DIAGNOSIS — R809 Proteinuria, unspecified: Secondary | ICD-10-CM | POA: Diagnosis not present

## 2020-05-29 DIAGNOSIS — E559 Vitamin D deficiency, unspecified: Secondary | ICD-10-CM | POA: Diagnosis not present

## 2020-05-29 DIAGNOSIS — E669 Obesity, unspecified: Secondary | ICD-10-CM | POA: Diagnosis not present

## 2020-05-29 DIAGNOSIS — N1831 Chronic kidney disease, stage 3a: Secondary | ICD-10-CM | POA: Diagnosis not present

## 2020-05-29 DIAGNOSIS — R609 Edema, unspecified: Secondary | ICD-10-CM | POA: Diagnosis not present

## 2020-05-29 DIAGNOSIS — E1122 Type 2 diabetes mellitus with diabetic chronic kidney disease: Secondary | ICD-10-CM | POA: Diagnosis not present

## 2020-05-29 DIAGNOSIS — I129 Hypertensive chronic kidney disease with stage 1 through stage 4 chronic kidney disease, or unspecified chronic kidney disease: Secondary | ICD-10-CM | POA: Diagnosis not present

## 2020-05-29 LAB — COMPREHENSIVE METABOLIC PANEL
Albumin: 4.6 (ref 3.5–5.0)
Calcium: 10.2 (ref 8.7–10.7)
GFR calc Af Amer: 78
GFR calc non Af Amer: 67

## 2020-05-29 LAB — BASIC METABOLIC PANEL
BUN: 26 — AB (ref 4–21)
CO2: 26 — AB (ref 13–22)
Chloride: 102 (ref 99–108)
Creatinine: 1.1 (ref 0.6–1.3)
Glucose: 142
Potassium: 4.2 (ref 3.4–5.3)
Sodium: 139 (ref 137–147)

## 2020-06-19 ENCOUNTER — Ambulatory Visit (INDEPENDENT_AMBULATORY_CARE_PROVIDER_SITE_OTHER)
Admission: RE | Admit: 2020-06-19 | Discharge: 2020-06-19 | Disposition: A | Discharge status: Home / Self Care | Payer: Medicare Other | Source: Ambulatory Visit | Attending: Surgery | Admitting: Surgery

## 2020-06-19 ENCOUNTER — Encounter: Payer: Self-pay | Admitting: Surgery

## 2020-06-19 ENCOUNTER — Ambulatory Visit (INDEPENDENT_AMBULATORY_CARE_PROVIDER_SITE_OTHER): Payer: Medicare Other | Admitting: Surgery

## 2020-06-19 ENCOUNTER — Other Ambulatory Visit: Payer: Self-pay

## 2020-06-19 ENCOUNTER — Ambulatory Visit (HOSPITAL_COMMUNITY)
Admission: RE | Admit: 2020-06-19 | Discharge: 2020-06-19 | Disposition: A | Discharge status: Home / Self Care | Payer: Medicare Other | Source: Ambulatory Visit | Attending: Surgery | Admitting: Surgery

## 2020-06-19 VITALS — BP 159/97 | HR 59 | Temp 98.1°F | Resp 20 | Ht 70.0 in | Wt 258.6 lb

## 2020-06-19 DIAGNOSIS — I6523 Occlusion and stenosis of bilateral carotid arteries: Secondary | ICD-10-CM | POA: Insufficient documentation

## 2020-06-19 DIAGNOSIS — I724 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: Secondary | ICD-10-CM | POA: Diagnosis not present

## 2020-06-19 DIAGNOSIS — I779 Disorder of arteries and arterioles, unspecified: Secondary | ICD-10-CM

## 2020-06-19 DIAGNOSIS — I714 Abdominal aortic aneurysm, without rupture, unspecified: Secondary | ICD-10-CM

## 2020-06-19 NOTE — Progress Notes (Signed)
[facility] [place]  Patient name: David [name] MRN: [id number] DOB: 1950-07-05 Sex: male   REASON FOR VISIT:    Follow up  HISOTRY OF PRESENT ILLNESS:   David [name] is a 70 y.o. male who has undergone open abdominal aortic aneurysm repair by Dr. Amedeo Plenty in 2012. He's had viabahn stenting of his right popliteal aneurysm in 12/2008. He had his left popliteal aneurysm repaired by an above-knee to below-knee bypass in October of 2010.  He's had a midline abdominal hernia repair by Dr. Johney Maine. On 08-08-2019, the proximal anastamosis to his bypass graft was stented after a dissection following PTA.  On 01/12/2013 he underwent angiography with a successful balloon angioplasty of the stent using a 6 mm balloon.  On 11/23/2014 he underwent drug coated balloon angioplasty of the left proximal anastomotic stent.Marland Kitchen He also underwent balloon angioplasty of a left common iliac stenosis and angioplasty of the right common iliac stenosis. I performed angiography via a left brachial approach on 10-05-2019 and found progressive native disease in both legs, left worse than right.  We have elected to monitor these lesions.  He states that his legs feel good he is not having any claudication.  He does not have open wounds.   He remains in atrial fibrillation which is treated with Eliquis. He is also on Plavix. He has no symptoms of claudication. He has had no neurologic symptoms   PAST MEDICAL HISTORY:   Past Medical History:  Diagnosis Date  . AAA (abdominal aortic aneurysm) ([place])   . Adenomatous colon polyp 04/2005  . 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 Anticardiolipin antibody positive   . Anxiety   . ANXIETY 07/19/2008  . CEREBROVASCULAR ACCIDENT, HX OF 07/19/2008  . CVA (cerebral vascular accident) ([employer]) 2003  . Depression   . DEPRESSION 07/19/2008  . Diabetes mellitus    type II  . DIABETES MELLITUS, TYPE II  01/18/2008  . Diverticulosis   . GERD 07/19/2008  . GERD (gastroesophageal reflux disease)   . Hemorrhoids   . Hx of adenomatous colonic polyps   . Hyperlipidemia   . HYPERLIPIDEMIA 07/19/2008  . Hypertension   . HYPERTENSION 07/31/2009  . Increased prostate specific antigen (PSA) velocity 09/13/2010  . Internal hemorrhoids   . Mitral regurgitation    Echo 9/21: EF 55-60, no RWMA, normal RVSF, mild-moderate LAE, mild-moderate MR, mild aortic stenosis (mean gradient 9.2 mmHg)  . Perineal abscess   . Peripheral vascular disease ([place])    peripheral vascular disease/carotid artery <100%  . PERSONAL HX COLONIC POLYPS 12/10/2007  . Unspecified Peripheral Vascular Disease 01/18/2008  . Varicose veins      FAMILY HISTORY:   Family History  Problem Relation Age of Onset  . Aortic aneurysm Mother        desceding aoritic aneurysm  . Cancer Brother        Lung cancer  . Colon cancer Neg Hx   . Esophageal cancer Neg Hx   . Stomach cancer Neg Hx   . Rectal cancer Neg Hx     SOCIAL HISTORY:   Social History   Tobacco Use  . Smoking status: Former Smoker    Types: Cigarettes    Quit date: 01/14/1992    Years since quitting: 28.4  . Smokeless tobacco: Never Used  Substance Use Topics  . Alcohol use: Yes    Alcohol/week: 2.0 standard drinks    Types: 2 Glasses of wine per week  Comment: wine daily     ALLERGIES:   Allergies  Allergen Reactions  . Latex Rash     CURRENT MEDICATIONS:   Current Outpatient Medications  Medication Sig Dispense Refill  . amLODipine (NORVASC) 5 MG tablet TAKE (2) TABLETS BY MOUTH DAILY. (Patient taking differently: Take 10 mg by mouth daily.) 60 tablet 0  . Ascorbic Acid (VITAMIN C) 1000 MG tablet Take 1,000 mg by mouth daily.    Marland Kitchen atorvastatin (LIPITOR) 40 MG tablet TAKE 1 TABLET ONCE DAILY. (Patient taking differently: Take 40 mg by mouth daily.) 90 tablet 3  . cetirizine (ZYRTEC) 10 MG tablet Take 1 tablet (10 mg total) by mouth daily. 90  tablet 3  . Cholecalciferol (VITAMIN D) 50 MCG (2000 UT) tablet Take 2,000 Units by mouth daily.    . clopidogrel (PLAVIX) 75 MG tablet TAKE 1 TABLET EACH DAY. (Patient taking differently: Take 75 mg by mouth daily.) 90 tablet 3  . docusate sodium (COLACE) 100 MG capsule Take 100 mg by mouth 2 (two) times daily.    Marland Kitchen ELIQUIS 5 MG TABS tablet TAKE 1 TABLET BY MOUTH TWICE DAILY. 180 tablet 0  . famotidine (PEPCID) 20 MG tablet Take 20 mg by mouth 2 (two) times daily.    Marland Kitchen FARXIGA 10 MG TABS tablet Take 10 mg by mouth daily.    . folic acid (FOLVITE) 638 MCG tablet Take 800 mcg by mouth daily.    . furosemide (LASIX) 40 MG tablet Take 40 mg by mouth daily.    Marland Kitchen gemfibrozil (LOPID) 600 MG tablet TAKE 2 TABLETS BY MOUTH ONCE DAILY. (Patient taking differently: Take 600 mg by mouth in the morning and at bedtime.) 180 tablet 3  . metFORMIN (GLUCOPHAGE-XR) 500 MG 24 hr tablet TAKE 4 TABLETS EVERY AM. 360 tablet 2  . metroNIDAZOLE (METROGEL) 0.75 % gel Apply 1 application topically every other day.     . Multiple Vitamin (MULTIVITAMIN) capsule Take 1 capsule by mouth daily.    Marland Kitchen olmesartan (BENICAR) 40 MG tablet Take 1 tablet (40 mg total) by mouth daily. Follow-up appt due in August must see provider for future refills 90 tablet 0  . Omega-3 Fatty Acids (FISH OIL) 1000 MG CAPS Take 1,000 mg by mouth daily.    . psyllium (FIBER LAXATIVE) 0.52 g capsule Take 3.36 g by mouth daily with lunch.    Marland Kitchen SHINGRIX injection     . triamcinolone (NASACORT) 55 MCG/ACT AERO nasal inhaler Place 2 sprays into the nose daily. (Patient taking differently: Place 1 spray into the nose daily as needed (allergies).) 1 Inhaler 12   No current facility-administered medications for this visit.    REVIEW OF SYSTEMS:   [X]  denotes positive finding, [ ]  denotes negative finding Cardiac  Comments:  Chest pain or chest pressure:    Shortness of breath upon exertion:    Short of breath when lying flat:    Irregular heart  rhythm:        Vascular    Pain in calf, thigh, or hip brought on by ambulation:    Pain in feet at night that wakes you up from your sleep:     Blood clot in your veins:    Leg swelling:         Pulmonary    Oxygen at home:    Productive cough:     Wheezing:         Neurologic    Sudden weakness in arms or legs:  Sudden numbness in arms or legs:     Sudden onset of difficulty speaking or slurred speech:    Temporary loss of vision in one eye:     Problems with dizziness:         Gastrointestinal    Blood in stool:     Vomited blood:         Genitourinary    Burning when urinating:     Blood in urine:        Psychiatric    Major depression:         Hematologic    Bleeding problems:    Problems with blood clotting too easily:        Skin    Rashes or ulcers:        Constitutional    Fever or chills:      PHYSICAL EXAM:   Vitals:   06/19/20 1139  BP: (!) 159/97  Pulse: (!) 59  Resp: 20  Temp: 98.1 F (36.7 C)  SpO2: 98%  Weight: 258 lb 9.6 oz (117.3 kg)  Height: 5\' 10"  (1.778 m)    GENERAL: The patient is a well-nourished male, in no acute distress. The vital signs are documented above. CARDIAC: There is a regular rate and rhythm.  PULMONARY: Non-labored respirations  MUSCULOSKELETAL: There are no major deformities or cyanosis. NEUROLOGIC: No focal weakness or paresthesias are detected. SKIN: There are no ulcers or rashes noted. PSYCHIATRIC: The patient has a normal affect.  STUDIES:   I have reviewed the following: Carotid: Right Carotid: Velocities in the right ICA are consistent with a 40-59%         stenosis. The ECA appears >50% stenosed. Moderate plaque  noted at         bifurcation, however increased velocities could also be  related         to contralateral occlusion.   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   ABI/TBIToday's ABIToday's TBIPrevious ABIPrevious TBI   +-------+-----------+-----------+------------+------------+  Right 0.88    0.64    0.75    0.6       +-------+-----------+-----------+------------+------------+  Left  0.86    0.57    0.7     0.4       +-------+-----------+-----------+------------+------------+    LE DUPLEX: Right: 50-74% stenosis noted in the deep femoral artery. 50-74% stenosis  noted in the superficial femoral artery. Patent stent with 50-74% stenosis  at stent outflow (distal popliteal).   Left: Left AK-BK popliteal Bypass graft is patent with no stenosis noted.  Previous stenosis at proximal anastomosis was not visualized on today's  exam.  MEDICAL ISSUES:   Carotid: He remains asymptomatic.  He has mild to moderate stenosis on the right in the setting of a known left carotid occlusion.  I will repeat his ultrasound in 1 year  Lower extremity: He has undergone endovascular repair of a right popliteal aneurysm and bypass graft for left popliteal aneurysm.  He has known disease in the native vessels above and below his bypass grafts.  He remains asymptomatic.  We are continue to monitor him for symptoms.  I have scheduled him for follow-up in 1 year.  I instructed him to call me if he develops any new symptoms and I will see him immediately.    Leia Alf, MD, FACS [facility] [phone] Pager [phone]

## 2020-06-30 DIAGNOSIS — L820 Inflamed seborrheic keratosis: Secondary | ICD-10-CM | POA: Diagnosis not present

## 2020-06-30 DIAGNOSIS — L570 Actinic keratosis: Secondary | ICD-10-CM | POA: Diagnosis not present

## 2020-06-30 DIAGNOSIS — D485 Neoplasm of uncertain behavior of skin: Secondary | ICD-10-CM | POA: Diagnosis not present

## 2020-06-30 DIAGNOSIS — D044 Carcinoma in situ of skin of scalp and neck: Secondary | ICD-10-CM | POA: Diagnosis not present

## 2020-07-09 ENCOUNTER — Other Ambulatory Visit: Payer: Self-pay | Admitting: Internal Medicine

## 2020-07-13 ENCOUNTER — Ambulatory Visit: Payer: Medicare Other | Admitting: Internal Medicine

## 2020-07-21 ENCOUNTER — Other Ambulatory Visit: Payer: Self-pay

## 2020-07-21 ENCOUNTER — Encounter: Payer: Self-pay | Admitting: Internal Medicine

## 2020-07-21 ENCOUNTER — Ambulatory Visit (INDEPENDENT_AMBULATORY_CARE_PROVIDER_SITE_OTHER): Payer: Medicare Other | Admitting: Internal Medicine

## 2020-07-21 VITALS — BP 140/74 | HR 58 | Temp 98.5°F | Ht 70.0 in | Wt 261.0 lb

## 2020-07-21 DIAGNOSIS — E538 Deficiency of other specified B group vitamins: Secondary | ICD-10-CM | POA: Diagnosis not present

## 2020-07-21 DIAGNOSIS — E1121 Type 2 diabetes mellitus with diabetic nephropathy: Secondary | ICD-10-CM | POA: Diagnosis not present

## 2020-07-21 DIAGNOSIS — R972 Elevated prostate specific antigen [PSA]: Secondary | ICD-10-CM | POA: Diagnosis not present

## 2020-07-21 DIAGNOSIS — I779 Disorder of arteries and arterioles, unspecified: Secondary | ICD-10-CM | POA: Diagnosis not present

## 2020-07-21 DIAGNOSIS — N183 Chronic kidney disease, stage 3 unspecified: Secondary | ICD-10-CM | POA: Diagnosis not present

## 2020-07-21 DIAGNOSIS — E559 Vitamin D deficiency, unspecified: Secondary | ICD-10-CM

## 2020-07-21 DIAGNOSIS — E7800 Pure hypercholesterolemia, unspecified: Secondary | ICD-10-CM

## 2020-07-21 DIAGNOSIS — I10 Essential (primary) hypertension: Secondary | ICD-10-CM

## 2020-07-21 LAB — CBC WITH DIFFERENTIAL/PLATELET
Basophils Absolute: 0.1 10*3/uL (ref 0.0–0.1)
Basophils Relative: 1.5 % (ref 0.0–3.0)
Eosinophils Absolute: 0.5 10*3/uL (ref 0.0–0.7)
Eosinophils Relative: 8.5 % — ABNORMAL HIGH (ref 0.0–5.0)
HCT: 37 % — ABNORMAL LOW (ref 39.0–52.0)
Hemoglobin: 12.9 g/dL — ABNORMAL LOW (ref 13.0–17.0)
Lymphocytes Relative: 19.2 % (ref 12.0–46.0)
Lymphs Abs: 1 10*3/uL (ref 0.7–4.0)
MCHC: 34.9 g/dL (ref 30.0–36.0)
MCV: 94.8 fl (ref 78.0–100.0)
Monocytes Absolute: 0.6 10*3/uL (ref 0.1–1.0)
Monocytes Relative: 11.2 % (ref 3.0–12.0)
Neutro Abs: 3.2 10*3/uL (ref 1.4–7.7)
Neutrophils Relative %: 59.6 % (ref 43.0–77.0)
Platelets: 287 10*3/uL (ref 150.0–400.0)
RBC: 3.91 Mil/uL — ABNORMAL LOW (ref 4.22–5.81)
RDW: 12.1 % (ref 11.5–15.5)
WBC: 5.3 10*3/uL (ref 4.0–10.5)

## 2020-07-21 LAB — HEPATIC FUNCTION PANEL
ALT: 16 U/L (ref 0–53)
AST: 21 U/L (ref 0–37)
Albumin: 4.4 g/dL (ref 3.5–5.2)
Alkaline Phosphatase: 46 U/L (ref 39–117)
Bilirubin, Direct: 0.1 mg/dL (ref 0.0–0.3)
Total Bilirubin: 0.5 mg/dL (ref 0.2–1.2)
Total Protein: 8.3 g/dL (ref 6.0–8.3)

## 2020-07-21 LAB — LIPID PANEL
Cholesterol: 117 mg/dL (ref 0–200)
HDL: 35.9 mg/dL — ABNORMAL LOW (ref >39.00)
LDL Cholesterol: 47 mg/dL (ref 0–99)
NonHDL: 81
Total CHOL/HDL Ratio: 3
Triglycerides: 171 mg/dL — ABNORMAL HIGH (ref 0.0–149.0)
VLDL: 34.2 mg/dL (ref 0.0–40.0)

## 2020-07-21 LAB — BASIC METABOLIC PANEL
BUN: 31 mg/dL — ABNORMAL HIGH (ref 6–23)
CO2: 25 mEq/L (ref 19–32)
Calcium: 10.6 mg/dL — ABNORMAL HIGH (ref 8.4–10.5)
Chloride: 101 mEq/L (ref 96–112)
Creatinine, Ser: 1.24 mg/dL (ref 0.40–1.50)
GFR: 58.37 mL/min — ABNORMAL LOW (ref >60.00)
Glucose, Bld: 134 mg/dL — ABNORMAL HIGH (ref 70–99)
Potassium: 4.2 mEq/L (ref 3.5–5.1)
Sodium: 137 mEq/L (ref 135–145)

## 2020-07-21 LAB — TSH: TSH: 1.63 u[IU]/mL (ref 0.35–4.50)

## 2020-07-21 LAB — HEMOGLOBIN A1C: Hgb A1c MFr Bld: 6 % (ref 4.6–6.5)

## 2020-07-21 LAB — VITAMIN B12: Vitamin B-12: 190 pg/mL — ABNORMAL LOW (ref 211–911)

## 2020-07-21 LAB — PHOSPHORUS: Phosphorus: 3.3 mg/dL (ref 2.3–4.6)

## 2020-07-21 LAB — PSA: PSA: 2.38 ng/mL (ref 0.10–4.00)

## 2020-07-21 LAB — VITAMIN D 25 HYDROXY (VIT D DEFICIENCY, FRACTURES): VITD: 43.66 ng/mL (ref 30.00–100.00)

## 2020-07-21 NOTE — Patient Instructions (Signed)

## 2020-07-21 NOTE — Progress Notes (Signed)
Patient ID: David [name], male   DOB: [birth year], 70 y.o.   MRN: [id number]         Chief Complaint:: wellness exam and Follow-up        HPI:  David [name] is a 70 y.o. male here for wellness exam;  Has eye exam appt next month. Has seen GI Dr Fuller Plan who now recommends f/u colonoscopy at 7 yrs (not 5 yrs).  O/w up to date with preventive referrals and immunizations                        Also taking now effudex type medication for skin lesions for 6 wk per [facility] dermatology.  Never had covid infection since mar 2020.  Denies urinary symptoms such as dysuria, frequency, urgency, flank pain, hematuria or n/v, fever, chills.  Taking vit d well.   Pt denies chest pain, increased sob or doe, wheezing, orthopnea, PND, increased LE swelling, palpitations, dizziness or syncope.   Pt denies polydipsia, polyuria, Denies new focal neuro s/s.   Pt denies fever, wt loss, night sweats, loss of appetite, or other constitutional symptoms     Wt Readings from Last 3 Encounters:  07/21/20 261 lb (118.4 kg)  06/19/20 258 lb 9.6 oz (117.3 kg)  02/03/20 275 lb 6.4 oz (124.9 kg)   BP Readings from Last 3 Encounters:  07/21/20 140/74  06/19/20 (!) 159/97  02/03/20 138/70   Immunization History  Administered Date(s) Administered  . Fluad Quad(high Dose 65+) 12/23/2018, 02/16/2020  . Influenza, High Dose Seasonal PF 03/03/2018  . PFIZER(Purple Top)SARS-COV-2 Vaccination 06/11/2019, 07/06/2019, 03/17/2020  . Pneumococcal Conjugate-13 08/18/2014  . Pneumococcal Polysaccharide-23 07/19/2008, 07/21/2013, 01/13/2020  . Td 07/19/2008  . Tdap 12/23/2018  . Zoster 11/22/2011  . Zoster Recombinat (Shingrix) 03/30/2019, 05/17/2019   There are no preventive care reminders to display for this patient.    Past Medical History:  Diagnosis Date  . AAA (abdominal aortic aneurysm) ([place])   . Adenomatous colon polyp 04/2005  . 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 Anticardiolipin antibody positive    . Anxiety   . ANXIETY 07/19/2008  . CEREBROVASCULAR ACCIDENT, HX OF 07/19/2008  . CVA (cerebral vascular accident) ([place]) 2003  . Depression   . DEPRESSION 07/19/2008  . Diabetes mellitus    type II  . DIABETES MELLITUS, TYPE II 01/18/2008  . Diverticulosis   . GERD 07/19/2008  . GERD (gastroesophageal reflux disease)   . Hemorrhoids   . Hx of adenomatous colonic polyps   . Hyperlipidemia   . HYPERLIPIDEMIA 07/19/2008  . Hypertension   . HYPERTENSION 07/31/2009  . Increased prostate specific antigen (PSA) velocity 09/13/2010  . Internal hemorrhoids   . Mitral regurgitation    Echo 9/21: EF 55-60, no RWMA, normal RVSF, mild-moderate LAE, mild-moderate MR, mild aortic stenosis (mean gradient 9.2 mmHg)  . Perineal abscess   . Peripheral vascular disease ([place])    peripheral vascular disease/carotid artery <100%  . PERSONAL HX COLONIC POLYPS 12/10/2007  . Unspecified Peripheral Vascular Disease 01/18/2008  . Varicose veins    Past Surgical History:  Procedure Laterality Date  . 7 abdominal hernia repairs  05/31/10  . ABDOMINAL AORTAGRAM N/A 01/12/2013   Procedure: ABDOMINAL Maxcine Ham;  Surgeon: Serafina Mitchell, MD;  Location: [facility] CATH LAB;  Service: Cardiovascular;  Laterality: N/A;  . ABDOMINAL AORTIC ANEURYSM REPAIR    . ABDOMINAL AORTOGRAM W/LOWER EXTREMITY Bilateral 10/05/2019   Procedure: ABDOMINAL AORTOGRAM W/LOWER EXTREMITY;  Surgeon: Serafina Mitchell, MD;  Location: [place] CV LAB;  Service: Cardiovascular;  Laterality: Bilateral;  . COLONOSCOPY    . hernia surgury     70 years old  . perineal abscess    . PERIPHERAL VASCULAR CATHETERIZATION N/A 11/23/2014   Procedure: Abdominal Aortogram;  Surgeon: Serafina Mitchell, MD;  Location: [place] CV LAB;  Service: Cardiovascular;  Laterality: N/A;  . PERIPHERAL VASCULAR CATHETERIZATION  11/23/2014   Procedure: Lower Extremity Angiography;  Surgeon: Serafina Mitchell, MD;  Location: [place] CV LAB;  Service: Cardiovascular;;  .  PERIPHERAL VASCULAR CATHETERIZATION  11/23/2014   Procedure: Peripheral Vascular Intervention;  Surgeon: Serafina Mitchell, MD;  Location: [place] CV LAB;  Service: Cardiovascular;;  PTA bilateral common iliac PTA/DCB left fem-pop  . POLYPECTOMY    . 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 SP ENDO REPAIR INFRAREN AAA      reports that he quit smoking about 28 years ago. His smoking use included cigarettes. He has never used smokeless tobacco. He reports current alcohol use of about 2.0 standard drinks of alcohol per week. He reports that he does not use drugs. family history includes Aortic aneurysm in his mother; Cancer in his brother. Allergies  Allergen Reactions  . Latex Rash   Current Outpatient Medications on File Prior to Visit  Medication Sig Dispense Refill  . amLODipine (NORVASC) 5 MG tablet TAKE (2) TABLETS BY MOUTH DAILY. (Patient taking differently: Take 10 mg by mouth daily.) 60 tablet 0  . Ascorbic Acid (VITAMIN C) 1000 MG tablet Take 1,000 mg by mouth daily.    Marland Kitchen atorvastatin (LIPITOR) 40 MG tablet TAKE 1 TABLET ONCE DAILY. 90 tablet 1  . cetirizine (ZYRTEC) 10 MG tablet Take 1 tablet (10 mg total) by mouth daily. 90 tablet 3  . Cholecalciferol (VITAMIN D) 50 MCG (2000 UT) tablet Take 2,000 Units by mouth daily.    . clopidogrel (PLAVIX) 75 MG tablet TAKE 1 TABLET EACH DAY. 90 tablet 1  . docusate sodium (COLACE) 100 MG capsule Take 100 mg by mouth 2 (two) times daily.    Marland Kitchen ELIQUIS 5 MG TABS tablet TAKE 1 TABLET BY MOUTH TWICE DAILY. 180 tablet 0  . famotidine (PEPCID) 20 MG tablet Take 20 mg by mouth 2 (two) times daily.    Marland Kitchen FARXIGA 10 MG TABS tablet Take 10 mg by mouth daily.    . folic acid (FOLVITE) 355 MCG tablet Take 800 mcg by mouth daily.    . furosemide (LASIX) 40 MG tablet Take 40 mg by mouth daily.    Marland Kitchen gemfibrozil (LOPID) 600 MG tablet TAKE 2 TABLETS BY MOUTH ONCE DAILY. 180 tablet 1  . metFORMIN (GLUCOPHAGE-XR) 500 MG 24 hr tablet TAKE 4 TABLETS  EVERY AM. 360 tablet 2  . metroNIDAZOLE (METROGEL) 0.75 % gel Apply 1 application topically every other day.     . Multiple Vitamin (MULTIVITAMIN) capsule Take 1 capsule by mouth daily.    Marland Kitchen olmesartan (BENICAR) 40 MG tablet Take 1 tablet (40 mg total) by mouth daily. Follow-up appt due in August must see provider for future refills 90 tablet 0  . Omega-3 Fatty Acids (FISH OIL) 1000 MG CAPS Take 1,000 mg by mouth daily.    . psyllium (FIBER LAXATIVE) 0.52 g capsule Take 3.36 g by mouth daily with lunch.    Marland Kitchen SHINGRIX injection     . triamcinolone (NASACORT) 55 MCG/ACT AERO nasal inhaler Place 2 sprays into  the nose daily. (Patient taking differently: Place 1 spray into the nose daily as needed (allergies).) 1 Inhaler 12   No current facility-administered medications on file prior to visit.        ROS:  All others reviewed and negative.  Objective        PE:  BP 140/74   Pulse (!) 58   Temp 98.5 F (36.9 C) (Oral)   Ht 5\' 10"  (1.778 m)   Wt 261 lb (118.4 kg)   SpO2 98%   BMI 37.45 kg/m                 Constitutional: Pt appears in NAD               HENT: Head: NCAT.                Right Ear: External ear normal.                 Left Ear: External ear normal.                Eyes: . Pupils are equal, round, and reactive to light. Conjunctivae and EOM are normal               Nose: without d/c or deformity               Neck: Neck supple. Gross normal ROM               Cardiovascular: Normal rate and regular rhythm.                 Pulmonary/Chest: Effort normal and breath sounds without rales or wheezing.                Abd:  Soft, NT, ND, + BS, no organomegaly               Neurological: Pt is alert. At baseline orientation, motor grossly intact               Skin: Skin is warm. No rashes, no other new lesions, LE edema - trace bilat chronic               Psychiatric: Pt behavior is normal without agitation   Micro: none  Cardiac tracings I have personally interpreted today:   none  Pertinent Radiological findings (summarize): none   Lab Results  Component Value Date   WBC 5.3 07/21/2020   HGB 12.9 (L) 07/21/2020   HCT 37.0 (L) 07/21/2020   PLT 287.0 07/21/2020   GLUCOSE 134 (H) 07/21/2020   CHOL 117 07/21/2020   TRIG 171.0 (H) 07/21/2020   HDL 35.90 (L) 07/21/2020   LDLDIRECT 56.0 12/22/2018   LDLCALC 47 07/21/2020   ALT 16 07/21/2020   AST 21 07/21/2020   NA 137 07/21/2020   K 4.2 07/21/2020   CL 101 07/21/2020   CREATININE 1.24 07/21/2020   BUN 31 (H) 07/21/2020   CO2 25 07/21/2020   TSH 1.63 07/21/2020   PSA 2.38 07/21/2020   INR 1.17 10/16/2017   HGBA1C 6.0 07/21/2020   MICROALBUR 173.4 (H) 11/12/2016   Assessment/Plan:  MIKEN STECHER is a 70 y.o. White or Caucasian [1] male with  has a past medical history of AAA (abdominal aortic aneurysm) ([place]), Adenomatous colon polyp (04/2005), 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 ([place]) (09/07/2008), Anticardiolipin antibody positive, Anxiety, ANXIETY (07/19/2008), CEREBROVASCULAR ACCIDENT, HX OF (07/19/2008), CVA (cerebral vascular accident) ([place]) (2003), Depression, DEPRESSION (07/19/2008), Diabetes mellitus,  DIABETES MELLITUS, TYPE II (01/18/2008), Diverticulosis, GERD (07/19/2008), GERD (gastroesophageal reflux disease), Hemorrhoids, adenomatous colonic polyps, Hyperlipidemia, HYPERLIPIDEMIA (07/19/2008), Hypertension, HYPERTENSION (07/31/2009), Increased prostate specific antigen (PSA) velocity (09/13/2010), Internal hemorrhoids, Mitral regurgitation, Perineal abscess, Peripheral vascular disease ([place]), PERSONAL HX COLONIC POLYPS (12/10/2007), Unspecified Peripheral Vascular Disease (01/18/2008), and Varicose veins.  Vitamin D deficiency Last vitamin D Lab Results  Component Value Date   VD25OH 43.66 07/21/2020   Stable, cont oral replacement   CKD (chronic kidney disease) Lab Results  Component Value Date   CREATININE 1.24 07/21/2020   Stable overall, cont to avoid nephrotoxins   Diabetes ([place]) Lab  Results  Component Value Date   HGBA1C 6.0 07/21/2020   Stable, pt to continue current medical treatment metformin, farxiga   Essential hypertension BP Readings from Last 3 Encounters:  07/21/20 140/74  06/19/20 (!) 159/97  02/03/20 138/70   Stable, pt to continue medical treatment benicar 40   HLD (hyperlipidemia) Lab Results  Component Value Date   LDLCALC 47 07/21/2020   Stable, pt to continue current lopid   Increased prostate specific antigen (PSA) velocity Asympt, for psa with lab  Followup: Return in about 6 months (around 01/21/2021).  Cathlean Cower, MD 07/23/2020 1:18 AM [facility]

## 2020-07-23 ENCOUNTER — Encounter: Payer: Self-pay | Admitting: Internal Medicine

## 2020-07-23 NOTE — Assessment & Plan Note (Signed)
BP Readings from Last 3 Encounters:  07/21/20 140/74  06/19/20 (!) 159/97  02/03/20 138/70   Stable, pt to continue medical treatment benicar 40

## 2020-07-23 NOTE — Assessment & Plan Note (Signed)
Asympt, for psa with lab

## 2020-07-23 NOTE — Assessment & Plan Note (Signed)
Lab Results  Component Value Date   HGBA1C 6.0 07/21/2020   Stable, pt to continue current medical treatment metformin, farxiga

## 2020-07-23 NOTE — Assessment & Plan Note (Signed)
Last vitamin D Lab Results  Component Value Date   VD25OH 43.66 07/21/2020   Stable, cont oral replacement

## 2020-07-23 NOTE — Assessment & Plan Note (Signed)
Lab Results  Component Value Date   CREATININE 1.24 07/21/2020   Stable overall, cont to avoid nephrotoxins

## 2020-07-23 NOTE — Assessment & Plan Note (Signed)
Lab Results  Component Value Date   LDLCALC 47 07/21/2020   Stable, pt to continue current lopid

## 2020-07-27 LAB — PTH, INTACT AND CALCIUM
Calcium: 10.5 mg/dL — ABNORMAL HIGH (ref 8.6–10.3)
PTH: 16 pg/mL (ref 16–77)

## 2020-08-13 ENCOUNTER — Other Ambulatory Visit: Payer: Self-pay | Admitting: Internal Medicine

## 2020-08-13 NOTE — Telephone Encounter (Signed)
Please refill as per office routine med refill policy (all routine meds refilled for 3 mo or monthly per pt preference up to one year from last visit, then month to month grace period for 3 mo, then further med refills will have to be denied)  

## 2020-08-18 ENCOUNTER — Ambulatory Visit: Payer: Medicare Other | Attending: Internal Medicine

## 2020-08-18 DIAGNOSIS — Z23 Encounter for immunization: Secondary | ICD-10-CM

## 2020-08-18 NOTE — Progress Notes (Signed)
   Covid-19 Vaccination Clinic  Name:  David [name]    MRN: [id number] DOB: Apr 30, 1951  08/18/2020  [name] was observed post Covid-19 immunization for 15 minutes without incident. He was provided with Vaccine Information Sheet and instruction to access the V-Safe system.   [name] was instructed to call 911 with any severe reactions post vaccine: Marland Kitchen Difficulty breathing  . Swelling of face and throat  . A fast heartbeat  . A bad rash all over body  . Dizziness and weakness   Immunizations Administered    Name Date Dose VIS Date Route   PFIZER Comrnaty(Gray TOP) Covid-19 Vaccine 08/18/2020 11:08 AM 0.3 mL 04/13/2020 Intramuscular   Manufacturer: [place]   Lot: [id number]   NDC: [phone]

## 2020-08-24 ENCOUNTER — Other Ambulatory Visit (HOSPITAL_BASED_OUTPATIENT_CLINIC_OR_DEPARTMENT_OTHER): Payer: Self-pay

## 2020-08-24 DIAGNOSIS — Z23 Encounter for immunization: Secondary | ICD-10-CM | POA: Diagnosis not present

## 2020-08-24 DIAGNOSIS — L570 Actinic keratosis: Secondary | ICD-10-CM | POA: Diagnosis not present

## 2020-08-24 DIAGNOSIS — D044 Carcinoma in situ of skin of scalp and neck: Secondary | ICD-10-CM | POA: Diagnosis not present

## 2020-08-24 MED ORDER — PFIZER-BIONT COVID-19 VAC-TRIS 30 MCG/0.3ML IM SUSP
INTRAMUSCULAR | 0 refills | Status: DC
  Filled 2020-08-24: qty 0.3, 1d supply, fill #0

## 2020-08-30 ENCOUNTER — Other Ambulatory Visit: Payer: Self-pay | Admitting: Cardiovascular Disease

## 2020-08-30 DIAGNOSIS — I4819 Other persistent atrial fibrillation: Secondary | ICD-10-CM

## 2020-08-30 NOTE — Telephone Encounter (Signed)
Eliquis 5mg  refill request received. Patient is 70 years old, weight-118.4kg, Crea-1.24 on 07/21/2020, Diagnosis-Afib, and last seen by Estella Husk on 11/30/2019. Dose is appropriate based on dosing criteria. Will send in refill to requested pharmacy.

## 2020-10-26 DIAGNOSIS — D044 Carcinoma in situ of skin of scalp and neck: Secondary | ICD-10-CM | POA: Diagnosis not present

## 2020-11-13 ENCOUNTER — Other Ambulatory Visit: Payer: Self-pay | Admitting: Internal Medicine

## 2020-11-26 ENCOUNTER — Other Ambulatory Visit: Payer: Self-pay | Admitting: Cardiovascular Disease

## 2020-11-26 DIAGNOSIS — I4819 Other persistent atrial fibrillation: Secondary | ICD-10-CM

## 2020-11-27 NOTE — Telephone Encounter (Signed)
Pt last saw Ermalinda Barrios, PA on 11/30/19, pt has follow-up scheduled for 03/07/21.  Last labs 07/21/20 Creat 1.24, age 70, weight 118.4kg, based on specified criteria pt is on appropriate dosage of Eliquis '5mg'$  BID.  Will refill rx.

## 2020-11-30 DIAGNOSIS — R609 Edema, unspecified: Secondary | ICD-10-CM | POA: Diagnosis not present

## 2020-11-30 DIAGNOSIS — I129 Hypertensive chronic kidney disease with stage 1 through stage 4 chronic kidney disease, or unspecified chronic kidney disease: Secondary | ICD-10-CM | POA: Diagnosis not present

## 2020-11-30 DIAGNOSIS — E1122 Type 2 diabetes mellitus with diabetic chronic kidney disease: Secondary | ICD-10-CM | POA: Diagnosis not present

## 2020-11-30 DIAGNOSIS — E559 Vitamin D deficiency, unspecified: Secondary | ICD-10-CM | POA: Diagnosis not present

## 2020-11-30 DIAGNOSIS — R809 Proteinuria, unspecified: Secondary | ICD-10-CM | POA: Diagnosis not present

## 2020-11-30 DIAGNOSIS — N1831 Chronic kidney disease, stage 3a: Secondary | ICD-10-CM | POA: Diagnosis not present

## 2020-11-30 DIAGNOSIS — E669 Obesity, unspecified: Secondary | ICD-10-CM | POA: Diagnosis not present

## 2020-12-01 ENCOUNTER — Emergency Department (HOSPITAL_COMMUNITY): Payer: Medicare Other

## 2020-12-01 ENCOUNTER — Emergency Department (HOSPITAL_COMMUNITY)
Admission: EM | Admit: 2020-12-01 | Discharge: 2020-12-01 | Disposition: A | Discharge status: Home / Self Care | Payer: Medicare Other | Attending: Emergency Medicine | Admitting: Emergency Medicine

## 2020-12-01 DIAGNOSIS — S0001XA Abrasion of scalp, initial encounter: Secondary | ICD-10-CM | POA: Insufficient documentation

## 2020-12-01 DIAGNOSIS — W010XXA Fall on same level from slipping, tripping and stumbling without subsequent striking against object, initial encounter: Secondary | ICD-10-CM | POA: Diagnosis not present

## 2020-12-01 DIAGNOSIS — W19XXXA Unspecified fall, initial encounter: Secondary | ICD-10-CM

## 2020-12-01 DIAGNOSIS — Z789 Other specified health status: Secondary | ICD-10-CM

## 2020-12-01 DIAGNOSIS — S51011A Laceration without foreign body of right elbow, initial encounter: Secondary | ICD-10-CM

## 2020-12-01 DIAGNOSIS — S199XXA Unspecified injury of neck, initial encounter: Secondary | ICD-10-CM | POA: Diagnosis not present

## 2020-12-01 DIAGNOSIS — F10929 Alcohol use, unspecified with intoxication, unspecified: Secondary | ICD-10-CM | POA: Insufficient documentation

## 2020-12-01 DIAGNOSIS — Y92007 Garden or yard of unspecified non-institutional (private) residence as the place of occurrence of the external cause: Secondary | ICD-10-CM | POA: Diagnosis not present

## 2020-12-01 DIAGNOSIS — E119 Type 2 diabetes mellitus without complications: Secondary | ICD-10-CM | POA: Diagnosis not present

## 2020-12-01 DIAGNOSIS — Z7901 Long term (current) use of anticoagulants: Secondary | ICD-10-CM | POA: Insufficient documentation

## 2020-12-01 DIAGNOSIS — S80212A Abrasion, left knee, initial encounter: Secondary | ICD-10-CM | POA: Insufficient documentation

## 2020-12-01 DIAGNOSIS — Z7289 Other problems related to lifestyle: Secondary | ICD-10-CM | POA: Diagnosis not present

## 2020-12-01 DIAGNOSIS — Y908 Blood alcohol level of 240 mg/100 ml or more: Secondary | ICD-10-CM | POA: Diagnosis not present

## 2020-12-01 DIAGNOSIS — H25041 Posterior subcapsular polar age-related cataract, right eye: Secondary | ICD-10-CM | POA: Diagnosis not present

## 2020-12-01 DIAGNOSIS — S0990XA Unspecified injury of head, initial encounter: Secondary | ICD-10-CM | POA: Diagnosis not present

## 2020-12-01 DIAGNOSIS — F1090 Alcohol use, unspecified, uncomplicated: Secondary | ICD-10-CM

## 2020-12-01 DIAGNOSIS — H5213 Myopia, bilateral: Secondary | ICD-10-CM | POA: Diagnosis not present

## 2020-12-01 DIAGNOSIS — J9811 Atelectasis: Secondary | ICD-10-CM | POA: Diagnosis not present

## 2020-12-01 DIAGNOSIS — E872 Acidosis: Secondary | ICD-10-CM | POA: Diagnosis not present

## 2020-12-01 DIAGNOSIS — H2513 Age-related nuclear cataract, bilateral: Secondary | ICD-10-CM | POA: Diagnosis not present

## 2020-12-01 DIAGNOSIS — Y9301 Activity, walking, marching and hiking: Secondary | ICD-10-CM | POA: Insufficient documentation

## 2020-12-01 DIAGNOSIS — S80211A Abrasion, right knee, initial encounter: Secondary | ICD-10-CM | POA: Diagnosis not present

## 2020-12-01 DIAGNOSIS — S59901A Unspecified injury of right elbow, initial encounter: Secondary | ICD-10-CM | POA: Diagnosis present

## 2020-12-01 DIAGNOSIS — Z91040 Latex allergy status: Secondary | ICD-10-CM | POA: Insufficient documentation

## 2020-12-01 DIAGNOSIS — Z043 Encounter for examination and observation following other accident: Secondary | ICD-10-CM | POA: Diagnosis not present

## 2020-12-01 DIAGNOSIS — I517 Cardiomegaly: Secondary | ICD-10-CM | POA: Diagnosis not present

## 2020-12-01 LAB — CBC WITH DIFFERENTIAL/PLATELET
Abs Immature Granulocytes: 0.03 10*3/uL (ref 0.00–0.07)
Basophils Absolute: 0.1 10*3/uL (ref 0.0–0.1)
Basophils Relative: 2 %
Eosinophils Absolute: 0.4 10*3/uL (ref 0.0–0.5)
Eosinophils Relative: 7 %
HCT: 37.5 % — ABNORMAL LOW (ref 39.0–52.0)
Hemoglobin: 13.2 g/dL (ref 13.0–17.0)
Immature Granulocytes: 1 %
Lymphocytes Relative: 21 %
Lymphs Abs: 1.1 10*3/uL (ref 0.7–4.0)
MCH: 33.8 pg (ref 26.0–34.0)
MCHC: 35.2 g/dL (ref 30.0–36.0)
MCV: 96.2 fL (ref 80.0–100.0)
Monocytes Absolute: 0.6 10*3/uL (ref 0.1–1.0)
Monocytes Relative: 11 %
Neutro Abs: 3.1 10*3/uL (ref 1.7–7.7)
Neutrophils Relative %: 58 %
Platelets: 207 10*3/uL (ref 150–400)
RBC: 3.9 MIL/uL — ABNORMAL LOW (ref 4.22–5.81)
RDW: 11.8 % (ref 11.5–15.5)
WBC: 5.2 10*3/uL (ref 4.0–10.5)
nRBC: 0 % (ref 0.0–0.2)

## 2020-12-01 LAB — HM DIABETES EYE EXAM

## 2020-12-01 LAB — COMPREHENSIVE METABOLIC PANEL
ALT: 30 U/L (ref 0–44)
AST: 38 U/L (ref 15–41)
Albumin: 4.1 g/dL (ref 3.5–5.0)
Alkaline Phosphatase: 33 U/L — ABNORMAL LOW (ref 38–126)
Anion gap: 16 — ABNORMAL HIGH (ref 5–15)
BUN: 35 mg/dL — ABNORMAL HIGH (ref 8–23)
CO2: 17 mmol/L — ABNORMAL LOW (ref 22–32)
Calcium: 10 mg/dL (ref 8.9–10.3)
Chloride: 99 mmol/L (ref 98–111)
Creatinine, Ser: 1.5 mg/dL — ABNORMAL HIGH (ref 0.61–1.24)
GFR, Estimated: 49 mL/min — ABNORMAL LOW (ref >60)
Glucose, Bld: 148 mg/dL — ABNORMAL HIGH (ref 70–99)
Potassium: 3.4 mmol/L — ABNORMAL LOW (ref 3.5–5.1)
Sodium: 132 mmol/L — ABNORMAL LOW (ref 135–145)
Total Bilirubin: 0.5 mg/dL (ref 0.3–1.2)
Total Protein: 8 g/dL (ref 6.5–8.1)

## 2020-12-01 LAB — ETHANOL: Alcohol, Ethyl (B): 241 mg/dL — ABNORMAL HIGH (ref <10)

## 2020-12-01 MED ORDER — SODIUM CHLORIDE 0.9 % IV BOLUS: 1000.0000 mL | Freq: Once | INTRAVENOUS | Status: DC

## 2020-12-01 NOTE — ED Triage Notes (Addendum)
Pt BIB ems from home, endorses ETOH and fall with no LOC. Takes eliquis.  Abrasion to L forehead and skin tear on R elbow. A&O x4. Denies pain.  EMS vitals: 142 CBG 146/86 HR 70s in a fib 99% room air

## 2020-12-01 NOTE — ED Notes (Signed)
Trauma Response Nurse Note-  Reason for Call / Reason for Trauma activation:   - Level two fall on thinners  Initial Focused Assessment (If applicable, or please see trauma documentation):  - alert and appropriate male, abrasion x2 to posterior head, redness noted to forehead, skin tear right elbow  Interventions:  - VS, IV access&lab draw, portable XRAY, CT  Plan of Care as of this note:  -pending XRAY/CT films, anticipate discharge -TDAP updated 12/23/2018 per other chart  Event Summary:   - Patient arrives via EMS from home after a fall into the bathtub. ETOH on board, pt reports several vodka drinks tonight (as a celebration, does not typically drink daily). No LOC, takes eliquis. Patient alert and appropriate upon EDP assessment, states he has no pain and does not really want to be here. Abrasions to posterior head x2, bleeding controlled. Some bleeding to skin tear to R elbow, nonadherent dressing applied. Portable XRAYS completed, TRN escorted patient to CT. Pending imaging results at this time. No pending consults.   The Following (if applicable):    -MD notified: David [name]    -Time of Page/Time of notification: 2021    -TRN arrival Time: 2030    -End time: 2105  Please call TRN for further assistance [phone]

## 2020-12-01 NOTE — Progress Notes (Signed)
   12/01/20 2000  Clinical Encounter Type  Visited With Patient not available  Visit Type Trauma  Referral From Nurse  Consult/Referral To Chaplain  The chaplain responded to fall. No family is present with the patient. The patient is being assessed. No chaplain services needed at this time. The chaplain will follow up as needed.

## 2020-12-01 NOTE — ED Notes (Addendum)
Pt ambulated self to bathroom w/ walker without staff assistance

## 2020-12-01 NOTE — Progress Notes (Signed)
Orthopedic Tech Progress Note Patient Details:  David [name] 08/01/1950 [id number] Level 2 trauma Patient ID: David [name], male   DOB: 1950-07-02, 70 y.o.   MRN: [id number]  [name] 12/01/2020, 8:49 PM

## 2020-12-01 NOTE — ED Provider Notes (Signed)
[facility] EMERGENCY DEPARTMENT Provider Note   CSN: [id number] Arrival date & time: 12/01/20  2030     History No chief complaint on file.   David [name] is a 70 y.o. male presenting for evaluation after fall.  Patient states he had a few drinks tonight, was walking and was off balance and fell into the garden tub.  He has a history of imbalance and frequent falls.  He hit his head but did not lose consciousness.  He denies pain including in his head.  He did sustain a skin lack of his right elbow, but has no pain in his elbow.  He last took his Eliquis this morning.  No chest pain, neck pain, back pain.  He walks with a walker occasionally.  HPI     No past medical history on file.  There are no problems to display for this patient.    No family history on file.     Home Medications Prior to Admission medications   Not on File    Allergies    Latex  Review of Systems   Review of Systems  Skin:  Positive for wound.  Hematological:  Bruises/bleeds easily.  All other systems reviewed and are negative.  Physical Exam Updated Vital Signs BP 131/68   Pulse 71   Temp 98.3 F (36.8 C) (Oral)   Resp 14   Ht '5\' 10"'$  (1.778 m)   Wt 112.5 kg   SpO2 95%   BMI 35.58 kg/m   Physical Exam Vitals and nursing note reviewed.  Constitutional:      General: He is not in acute distress.    Appearance: Normal appearance.  HENT:     Head: Normocephalic.     Comments: Abrasion of the occiput Eyes:     Conjunctiva/sclera: Conjunctivae normal.     Pupils: Pupils are equal, round, and reactive to light.  Cardiovascular:     Rate and Rhythm: Normal rate and regular rhythm.     Pulses: Normal pulses.  Pulmonary:     Effort: Pulmonary effort is normal. No respiratory distress.     Breath sounds: Normal breath sounds. No wheezing.     Comments: Speaking in full sentences.  Clear lung sounds in all fields. Abdominal:     General: There is no  distension.     Palpations: Abdomen is soft. There is no mass.     Tenderness: There is no abdominal tenderness. There is no guarding or rebound.  Musculoskeletal:        General: Normal range of motion.     Cervical back: Normal range of motion and neck supple.     Comments: Small skin tear of the right elbow.  Full active range of motion of the elbow.  Radial pulses 2+ bilaterally.  Skin:    General: Skin is warm and dry.     Capillary Refill: Capillary refill takes less than 2 seconds.  Neurological:     Mental Status: He is alert and oriented to person, place, and time.  Psychiatric:        Mood and Affect: Mood and affect normal.        Speech: Speech normal.        Behavior: Behavior normal.    ED Results / Procedures / Treatments   Labs (all labs ordered are listed, but only abnormal results are displayed) Labs Reviewed  CBC WITH DIFFERENTIAL/PLATELET - Abnormal; Notable for the following components:  Result Value   RBC 3.90 (*)    HCT 37.5 (*)    All other components within normal limits  COMPREHENSIVE METABOLIC PANEL - Abnormal; Notable for the following components:   Sodium 132 (*)    Potassium 3.4 (*)    CO2 17 (*)    Glucose, Bld 148 (*)    BUN 35 (*)    Creatinine, Ser 1.50 (*)    Alkaline Phosphatase 33 (*)    GFR, Estimated 49 (*)    Anion gap 16 (*)    All other components within normal limits  ETHANOL - Abnormal; Notable for the following components:   Alcohol, Ethyl (B) 241 (*)    All other components within normal limits    EKG None  Radiology DG Pelvis 1-2 Views  Result Date: 12/01/2020 CLINICAL DATA:  Fall. EXAM: PELVIS - 1-2 VIEW COMPARISON:  None. FINDINGS: Right aspect of the iliac crest is not entirely included in the field of view. The cortical margins of the bony pelvis are grossly intact. No visualized fracture. Soft tissue attenuation from habitus limits assessment of the upper pelvis. Pubic symphysis and sacroiliac joints are  congruent. Both femoral heads are well-seated in the respective acetabula. IMPRESSION: No visible fracture of the pelvis. Soft tissue attenuation from habitus limits assessment of the upper pelvis, with right lateral iliac bone excluded from the field of view. Electronically Signed   By: Keith Rake M.D.   On: 12/01/2020 21:06   DG Elbow 2 Views Right  Result Date: 12/01/2020 CLINICAL DATA:  Fall. EXAM: RIGHT ELBOW - 2 VIEW COMPARISON:  None. FINDINGS: There is no evidence of fracture, dislocation, or joint effusion. Lateral view is slightly limited by positioning. Mild degenerative spurring with tiny olecranon spur. Soft tissues are unremarkable. IMPRESSION: No fracture or subluxation of the right elbow. Electronically Signed   By: Keith Rake M.D.   On: 12/01/2020 21:05   CT Head Wo Contrast  Result Date: 12/01/2020 CLINICAL DATA:  Head trauma, minor (Age >= 65y) Fall.  On anticoagulation.  No loss of consciousness. EXAM: CT HEAD WITHOUT CONTRAST TECHNIQUE: Contiguous axial images were obtained from the base of the skull through the vertex without intravenous contrast. COMPARISON:  None. FINDINGS: Brain: No intracranial hemorrhage, mass effect, or midline shift. Mild atrophy is normal for age. No hydrocephalus. The basilar cisterns are patent. No evidence of territorial infarct or acute ischemia. No extra-axial or intracranial fluid collection. Vascular: Atherosclerosis of skullbase vasculature without hyperdense vessel or abnormal calcification. Skull: No fracture or focal lesion. Sinuses/Orbits: Paranasal sinuses and mastoid air cells are clear. The visualized orbits are unremarkable. Other: None. IMPRESSION: No acute intracranial abnormality. No skull fracture. Electronically Signed   By: Keith Rake M.D.   On: 12/01/2020 21:13   CT Cervical Spine Wo Contrast  Result Date: 12/01/2020 CLINICAL DATA:  Neck trauma (Age >= 65y) Fall.  No loss of consciousness. EXAM: CT CERVICAL SPINE  WITHOUT CONTRAST TECHNIQUE: Multidetector CT imaging of the cervical spine was performed without intravenous contrast. Multiplanar CT image reconstructions were also generated. COMPARISON:  None. FINDINGS: Alignment: 2 mm anterolisthesis of C3 on C4 likely facet mediated. No traumatic subluxation. Skull base and vertebrae: No acute fracture. Vertebral body heights are maintained. The dens and skull base are intact. Moderate C1-C2 degenerative change. Soft tissues and spinal canal: No prevertebral fluid or swelling. No visible canal hematoma. Disc levels: Multilevel degenerative disc disease. There is facet hypertrophy most prominent at C3-C4 on the left. No high-grade  canal stenosis. Upper chest: Nonacute Other: Moderate carotid calcifications. IMPRESSION: 1. No acute fracture or subluxation of the cervical spine. 2. Multilevel degenerative disc disease and facet hypertrophy. Electronically Signed   By: Keith Rake M.D.   On: 12/01/2020 21:19   DG Chest Portable 1 View  Result Date: 12/01/2020 CLINICAL DATA:  Fall. EXAM: PORTABLE CHEST 1 VIEW COMPARISON:  Radiograph 04/01/2018 FINDINGS: Patient is rotated lung volumes are low. Grossly stable cardiomegaly. Aortic atherosclerosis and tortuosity. No pneumothorax or pleural effusion. Bibasilar atelectasis. No visualized rib fracture or acute osseous abnormality. IMPRESSION: 1. Rotated exam with low lung volumes and bibasilar atelectasis. 2. Stable cardiomegaly and aortic atherosclerosis. 3. No visualized traumatic injury. Electronically Signed   By: Keith Rake M.D.   On: 12/01/2020 21:04    Procedures Procedures   Medications Ordered in ED Medications  sodium chloride 0.9 % bolus 1,000 mL (1,000 mLs Intravenous Not Given 12/01/20 2330)    ED Course  I have reviewed the triage vital signs and the nursing notes.  Pertinent labs & imaging results that were available during my care of the patient were reviewed by me and considered in my medical  decision making (see chart for details).    MDM Rules/Calculators/A&P                           Patient presenting for evaluation after fall.  On exam, patient appears nontoxic.  He has superficial abrasion of the occiput and a skin tear of the right elbow.  Very small abrasions over the patellas bilaterally.  No active bleeding from these areas.  Will obtain CT head and neck to ensure no bleeding or fracture.  Chest, pelvis, and right elbow x-rays.  Basic labs including ethanol.  Labs interpreted by me, overall reassuring.  Ethanol is elevated consistent with alcohol use.  Mild acidosis, likely due to alcohol use.  Will give fluids for this.  CT head and neck negative for acute findings.  X-rays viewed and independently interpreted by me, no fracture or dislocation.  On reevaluation, patient remains alert and oriented.  No increasing pain.  His wife is in the room.  Will make sure patient is able to ambulate.   Patient ambulatory without difficulty.  Orthostatics negative.  Patient and wife refusing fluids, would just like to go home.  Patient is alert and oriented and talking with clear speech.  He is clinically sober.  I discussed signs of dehydration and acidosis on his labs, importance of p.o. hydration.  Encourage patient to decrease his alcohol use.  Discussed wound care of the right elbow skin tear.  At this time, patient appears safe for discharge.  Return precautions given.  Patient states he understands and agrees to plan.  Final Clinical Impression(s) / ED Diagnoses Final diagnoses:  Fall, initial encounter  Skin tear of right elbow without complication, initial encounter  Alcohol use    Rx / DC Orders ED Discharge Orders     None        Franchot Heidelberg, PA-C 12/01/20 2339    Drenda Freeze, MD 12/03/20 [phone]

## 2020-12-01 NOTE — Discharge Instructions (Addendum)
Follow-up with your primary care doctor for recheck of your labs to ensure that your dehydration and acidosis has resolved. Make sure you stay well-hydrated with water. Wash the cut on her arm with soap and water at least once a day.  Keep covered with a Band-Aid or bandage until it has healed. Return to the emergency room with any new, worsening, concerning symptoms

## 2020-12-01 NOTE — ED Notes (Signed)
E-signature pad unavailable at time of pt discharge. This RN discussed discharge materials with pt and answered all pt questions. Pt stated understanding of discharge material. ? ?

## 2020-12-01 NOTE — ED Notes (Signed)
Patient transported to CT 

## 2020-12-28 ENCOUNTER — Encounter: Payer: Self-pay | Admitting: Internal Medicine

## 2021-01-02 ENCOUNTER — Other Ambulatory Visit (HOSPITAL_BASED_OUTPATIENT_CLINIC_OR_DEPARTMENT_OTHER): Payer: Self-pay

## 2021-01-07 ENCOUNTER — Other Ambulatory Visit: Payer: Self-pay | Admitting: Internal Medicine

## 2021-01-07 NOTE — Telephone Encounter (Signed)
Please refill as per office routine med refill policy (all routine meds to be refilled for 3 mo or monthly (per pt preference) up to one year from last visit, then month to month grace period for 3 mo, then further med refills will have to be denied) ? ?

## 2021-02-11 ENCOUNTER — Other Ambulatory Visit: Payer: Self-pay | Admitting: Internal Medicine

## 2021-02-11 NOTE — Telephone Encounter (Signed)
Please refill as per office routine med refill policy (all routine meds to be refilled for 3 mo or monthly (per pt preference) up to one year from last visit, then month to month grace period for 3 mo, then further med refills will have to be denied) ? ?

## 2021-02-28 NOTE — Progress Notes (Signed)
Cardiology Office Note    Date:  03/07/2021   ID:  David [name], DOB 1950/12/16, MRN [id number]   PCP:  Biagio Borg, MD   [place]  Cardiologist:  Lauree Chandler, MD   Advanced Practice Provider:  No care team member to display Electrophysiologist:  None   [phone]   Chief Complaint  Patient presents with   Follow-up    History of Present Illness:  David [name] is a 70 y.o. 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 He has known disease in the native vessels above and below his bypass grafts. He is followed by Dr. Trula Slade, carotid disease, AAA status post open repair 2012, remote CVA, GERD. NST 2010 no ischemia, echo 01/2020  normal LVEF 55 to 60%, mild-mod MR, mild AS.   Patient comes in for f/u. Denies chest pain, dyspnea, dizziness, or presyncope. No bleeding problems on Plavix and Eliquis-costing him $80 for 3 months supply. Walks 30 min 3 times a week. Has lost 26 lbs since I saw him last year.    Past Medical History:  Diagnosis Date   AAA (abdominal aortic aneurysm)    Adenomatous colon polyp 04/2005   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 ([place]) 09/07/2008   Anticardiolipin antibody positive    Anxiety    ANXIETY 07/19/2008   CEREBROVASCULAR ACCIDENT, HX OF 07/19/2008   CVA (cerebral vascular accident) ([place]) 2003   Depression    DEPRESSION 07/19/2008   Diabetes mellitus    type II   DIABETES MELLITUS, TYPE II 01/18/2008   Diverticulosis    GERD 07/19/2008   GERD (gastroesophageal reflux disease)    Hemorrhoids    Hx of adenomatous colonic polyps    Hyperlipidemia    HYPERLIPIDEMIA 07/19/2008   Hypertension    HYPERTENSION 07/31/2009   Increased prostate specific antigen (PSA) velocity 09/13/2010   Internal hemorrhoids    Mitral regurgitation    Echo 9/21: EF 55-60, no RWMA, normal  RVSF, mild-moderate LAE, mild-moderate MR, mild aortic stenosis (mean gradient 9.2 mmHg)   Perineal abscess    Peripheral vascular disease ([place])    peripheral vascular disease/carotid artery <100%   PERSONAL HX COLONIC POLYPS 12/10/2007   Unspecified Peripheral Vascular Disease 01/18/2008   Varicose veins     Past Surgical History:  Procedure Laterality Date   7 abdominal hernia repairs  05/31/10   ABDOMINAL AORTAGRAM N/A 01/12/2013   Procedure: ABDOMINAL Maxcine Ham;  Surgeon: Serafina Mitchell, MD;  Location: [facility] CATH LAB;  Service: Cardiovascular;  Laterality: N/A;   ABDOMINAL AORTIC ANEURYSM REPAIR     ABDOMINAL AORTOGRAM W/LOWER EXTREMITY Bilateral 10/05/2019   Procedure: ABDOMINAL AORTOGRAM W/LOWER EXTREMITY;  Surgeon: Serafina Mitchell, MD;  Location: [place] CV LAB;  Service: Cardiovascular;  Laterality: Bilateral;   COLONOSCOPY     hernia surgury     70 years old   perineal abscess     PERIPHERAL VASCULAR CATHETERIZATION N/A 11/23/2014   Procedure: Abdominal Aortogram;  Surgeon: Serafina Mitchell, MD;  Location: [place] CV LAB;  Service: Cardiovascular;  Laterality: N/A;   PERIPHERAL VASCULAR CATHETERIZATION  11/23/2014   Procedure: Lower Extremity Angiography;  Surgeon: Serafina Mitchell, MD;  Location: [place] CV LAB;  Service: Cardiovascular;;   PERIPHERAL VASCULAR CATHETERIZATION  11/23/2014   Procedure: Peripheral Vascular Intervention;  Surgeon: Serafina Mitchell, MD;  [facility]  INVASIVE CV LAB;  Service: Cardiovascular;;  PTA bilateral common iliac PTA/DCB left fem-pop   POLYPECTOMY     s/p right and left popliteal aneurysm repair     SP ENDO REPAIR INFRAREN AAA      Current Medications: Current Meds  Medication Sig   amLODipine (NORVASC) 5 MG tablet TAKE (2) TABLETS BY MOUTH DAILY. (Patient taking differently: Take 10 mg by mouth daily.)   apixaban (ELIQUIS) 5 MG TABS tablet Take 1 tablet (5 mg total) by mouth 2 (two) times daily. Please call the office to schedule your  Cardiology follow up. Thanks.   Ascorbic Acid (VITAMIN C) 1000 MG tablet Take 1,000 mg by mouth daily.   atorvastatin (LIPITOR) 40 MG tablet TAKE ONE TABLET BY MOUTH ONCE DAILY   cetirizine (ZYRTEC) 10 MG tablet Take 1 tablet (10 mg total) by mouth daily.   Cholecalciferol (VITAMIN D) 50 MCG (2000 UT) tablet Take 2,000 Units by mouth daily.   clopidogrel (PLAVIX) 75 MG tablet TAKE ONE TABLET BY MOUTH EVERY DAY   COVID-19 mRNA Vac-TriS, Pfizer, (PFIZER-BIONT COVID-19 VAC-TRIS) SUSP injection Inject into the muscle.   COVID-19 mRNA vaccine, Pfizer, 30 MCG/0.3ML injection INJECT AS DIRECTED   docusate sodium (COLACE) 100 MG capsule Take 100 mg by mouth 2 (two) times daily.   famotidine (PEPCID) 20 MG tablet Take 20 mg by mouth 2 (two) times daily.   FARXIGA 10 MG TABS tablet Take 10 mg by mouth daily.   folic acid (FOLVITE) 400 MCG tablet Take 800 mcg by mouth daily.   furosemide (LASIX) 40 MG tablet Take 40 mg by mouth daily.   gemfibrozil (LOPID) 600 MG tablet TAKE TWO TABLETS BY MOUTH ONCE DAILY   metFORMIN (GLUCOPHAGE-XR) 500 MG 24 hr tablet TAKE 4 TABLETS EVERY MORNING   metroNIDAZOLE (METROGEL) 0.75 % gel Apply 1 application topically every other day.    Multiple Vitamin (MULTIVITAMIN) capsule Take 1 capsule by mouth daily.   olmesartan (BENICAR) 40 MG tablet Take 1 tablet (40 mg total) by mouth daily. Follow-up appt due in August must see provider for future refills   Omega-3 Fatty Acids (FISH OIL) 1000 MG CAPS Take 1,000 mg by mouth daily.   psyllium (FIBER LAXATIVE) 0.52 g capsule Take 3.36 g by mouth daily with lunch.   SHINGRIX injection    triamcinolone (NASACORT) 55 MCG/ACT AERO nasal inhaler Place 2 sprays into the nose daily. (Patient taking differently: Place 1 spray into the nose daily as needed (allergies).)     Allergies:   Latex and Latex   Social History   Socioeconomic History   Marital status: Married    Spouse name: Not on file   Number of children: 1   Years of  education: Not on file   Highest education level: Not on file  Occupational History   Occupation: dsiabled due to stroke    Employer: RETIRED  Tobacco Use   Smoking status: Former    Types: Cigarettes    Quit date: 01/14/1992    Years since quitting: 29.1   Smokeless tobacco: Never  Vaping Use   Vaping Use: Never used  Substance and Sexual Activity   Alcohol use: Yes    Alcohol/week: 2.0 standard drinks    Types: 2 Glasses of wine per week    Comment: wine daily   Drug use: No   Sexual activity: Not on file  Other Topics Concern   Not on file  Social History Narrative   2 cups of coffee in  the morning and tea throughout the day.   Social Determinants of Health   Financial Resource Strain: Not on file  Food Insecurity: Not on file  Transportation Needs: Not on file  Physical Activity: Not on file  Stress: Not on file  Social Connections: Not on file     Family History:  The patient's  family history includes Aortic aneurysm in his mother; Cancer in his brother.   ROS:   Please see the history of present illness.    ROS All other systems reviewed and are negative.   PHYSICAL EXAM:   VS:  BP (!) 144/80   Pulse (!) 54   Ht 5\' 10"  (1.778 m)   Wt 246 lb 9.6 oz (111.9 kg)   SpO2 98%   BMI 35.38 kg/m   Physical Exam  GEN: Well nourished, well developed, in no acute distress  Neck: no JVD, carotid bruits, or masses Cardiac:irreg irreg; 2/6 systolic murmur LSB and apex Respiratory:  clear to auscultation bilaterally, normal work of breathing GI: soft, nontender, nondistended, + BS Ext: without cyanosis, clubbing, or edema, Good distal pulses bilaterally Neuro:  Alert and Oriented x 3, Psych: euthymic mood, full affect  Wt Readings from Last 3 Encounters:  03/07/21 246 lb 9.6 oz (111.9 kg)  12/01/20 248 lb (112.5 kg)  07/21/20 261 lb (118.4 kg)      Studies/Labs Reviewed:   EKG:  EKG is  ordered today.  The ekg ordered today demonstrates Afib at 54/m poor ant  R wave progression unchanged  Recent Labs: 07/21/2020: TSH 1.63 12/01/2020: ALT 30; BUN 35; Creatinine, Ser 1.50; Hemoglobin 13.2; Platelets 207; Potassium 3.4; Sodium 132   Lipid Panel    Component Value Date/Time   CHOL 117 07/21/2020 0935   TRIG 171.0 (H) 07/21/2020 0935   HDL 35.90 (L) 07/21/2020 0935   CHOLHDL 3 07/21/2020 0935   VLDL 34.2 07/21/2020 0935   LDLCALC 47 07/21/2020 0935   LDLDIRECT 56.0 12/22/2018 0819    Additional studies/ records that were reviewed today include:  Carotid dopplers 06/2020 Summary:  Right Carotid: Velocities in the right ICA are consistent with a 40-59%                 stenosis. The ECA appears >50% stenosed. Moderate plaque  noted at                 bifurcation, however increased velocities could also be  related                to contralateral occlusion.   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    Echo 01/18/20  IMPRESSIONS     1. Left ventricular ejection fraction, by estimation, is 55 to 60%. The  left ventricle has normal function. The left ventricle has no regional  wall motion abnormalities. Left ventricular diastolic function could not  be evaluated.   2. Right ventricular systolic function is normal. The right ventricular  size is normal. Tricuspid regurgitation signal is inadequate for assessing  PA pressure.   3. Left atrial size was mild to moderately dilated.   4. The mitral valve is degenerative. Mild to moderate mitral valve  regurgitation. No evidence of mitral stenosis.   5. The aortic valve is tricuspid. There is moderate calcification of the  aortic valve. There is moderate thickening of the aortic valve. Aortic  valve regurgitation is not visualized. Mild aortic valve stenosis. Aortic  valve area, by VTI measures  1.79  cm. Aortic valve mean gradient measures 9.2 mmHg. Aortic valve Vmax  measures 2.18 m/s.   6. The inferior vena cava is normal in size with <50% respiratory   variability, suggesting right atrial pressure of 8 mmHg.   Comparison(s): No significant change from prior study. 12/01/14 EF 55-60%.  Mild MR.   Risk Assessment/Calculations:    CHA2DS2-VASc Score = 6   This indicates a 9.7% annual risk of stroke. The patient's score is based upon: CHF History: 0 HTN History: 1 Diabetes History: 1 Stroke History: 2 Vascular Disease History: 1 Age Score: 1 Gender Score: 0       ASSESSMENT:    1. Persistent atrial fibrillation ([place])   2. Essential hypertension   3. Other hyperlipidemia   4. Mitral valve disorder   5. Peripheral vascular disease ([place])   6. Carotid stenosis, bilateral      PLAN:  In order of problems listed above:  Persistent atrial fibrillation on Eliquis.  Not on AV nodal blocking agents. Crt 1.5 in July-to have upcoming blood work by PCP    Essential hypertension BP controlled   Hyperlipidemia followed by PCP LDL 47 07/2020   Mitral regurgitation echo 01/2020 EF 55-60%, mild ot mod MR and mild AS-asymptomatic. Wants to wait another year before repeating echo   PAD status post AAA repair 2012 as well as peripheral vascular stenting followed by Dr. Trula Slade  Carotid stenosis Dopplers 06/19/2020 total left ICA and 40 to 59% R ICA followed by Dr. Trula Slade   Obesity recommend weight loss and regular exercise as his knee and PAD allow. Has lost 26 lbs in past year.      Shared Decision Making/Informed Consent        Medication Adjustments/Labs and Tests Ordered: Current medicines are reviewed at length with the patient today.  Concerns regarding medicines are outlined above.  Medication changes, Labs and Tests ordered today are listed in the Patient Instructions below. Patient Instructions  Medication Instructions:  Your physician recommends that you continue on your current medications as directed. Please refer to the Current Medication list given to you today.  *If you need a refill on your cardiac medications  before your next appointment, please call your pharmacy*   Lab Work: None If you have labs (blood work) drawn today and your tests are completely normal, you will receive your results only by: [place] (if you have MyChart) OR A paper copy in the mail If you have any lab test that is abnormal or we need to change your treatment, we will call you to review the results.   Follow-Up: At [facility], you and your health needs are our priority.  As part of our continuing mission to provide you with exceptional heart care, we have created designated Provider Care Teams.  These Care Teams include your primary Cardiologist (physician) and Advanced Practice Providers (APPs -  Physician Assistants and Nurse Practitioners) who all work together to provide you with the care you need, when you need it.   Your next appointment:   1 year(s)  The format for your next appointment:   In Person  Provider:   You may see Lauree Chandler, MD or one of the following Advanced Practice Providers on your designated Care Team:   Melina Copa, PA-C Ermalinda Barrios, PA-C     Signed, Ermalinda Barrios, PA-C  03/07/2021 9:32 AM    [facility] [address], [place], [place]  [address] Phone: [phone];  Fax: [phone]

## 2021-03-07 ENCOUNTER — Encounter: Payer: Self-pay | Admitting: Physician Assistant

## 2021-03-07 ENCOUNTER — Ambulatory Visit (INDEPENDENT_AMBULATORY_CARE_PROVIDER_SITE_OTHER): Payer: Medicare Other | Admitting: Physician Assistant

## 2021-03-07 ENCOUNTER — Other Ambulatory Visit: Payer: Self-pay

## 2021-03-07 VITALS — BP 144/80 | HR 54 | Ht 70.0 in | Wt 246.6 lb

## 2021-03-07 DIAGNOSIS — E7849 Other hyperlipidemia: Secondary | ICD-10-CM

## 2021-03-07 DIAGNOSIS — I6523 Occlusion and stenosis of bilateral carotid arteries: Secondary | ICD-10-CM | POA: Diagnosis not present

## 2021-03-07 DIAGNOSIS — I4819 Other persistent atrial fibrillation: Secondary | ICD-10-CM | POA: Diagnosis not present

## 2021-03-07 DIAGNOSIS — I739 Peripheral vascular disease, unspecified: Secondary | ICD-10-CM

## 2021-03-07 DIAGNOSIS — I10 Essential (primary) hypertension: Secondary | ICD-10-CM | POA: Diagnosis not present

## 2021-03-07 DIAGNOSIS — I779 Disorder of arteries and arterioles, unspecified: Secondary | ICD-10-CM

## 2021-03-07 DIAGNOSIS — I059 Rheumatic mitral valve disease, unspecified: Secondary | ICD-10-CM | POA: Diagnosis not present

## 2021-03-07 NOTE — Patient Instructions (Signed)
Medication Instructions:  Your physician recommends that you continue on your current medications as directed. Please refer to the Current Medication list given to you today.  *If you need a refill on your cardiac medications before your next appointment, please call your pharmacy*   Lab Work: None If you have labs (blood work) drawn today and your tests are completely normal, you will receive your results only by: [place] (if you have MyChart) OR A paper copy in the mail If you have any lab test that is abnormal or we need to change your treatment, we will call you to review the results.   Follow-Up: At [facility], you and your health needs are our priority.  As part of our continuing mission to provide you with exceptional heart care, we have created designated Provider Care Teams.  These Care Teams include your primary Cardiologist (physician) and Advanced Practice Providers (APPs -  Physician Assistants and Nurse Practitioners) who all work together to provide you with the care you need, when you need it.   Your next appointment:   1 year(s)  The format for your next appointment:   In Person  Provider:   You may see Lauree Chandler, MD or one of the following Advanced Practice Providers on your designated Care Team:   Melina Copa, PA-C Ermalinda Barrios, PA-C

## 2021-03-22 ENCOUNTER — Ambulatory Visit (INDEPENDENT_AMBULATORY_CARE_PROVIDER_SITE_OTHER): Payer: Medicare Other

## 2021-03-22 ENCOUNTER — Other Ambulatory Visit: Payer: Self-pay

## 2021-03-22 DIAGNOSIS — Z23 Encounter for immunization: Secondary | ICD-10-CM

## 2021-04-09 ENCOUNTER — Other Ambulatory Visit: Payer: Self-pay | Admitting: Internal Medicine

## 2021-04-12 ENCOUNTER — Other Ambulatory Visit (HOSPITAL_BASED_OUTPATIENT_CLINIC_OR_DEPARTMENT_OTHER): Payer: Self-pay

## 2021-04-12 ENCOUNTER — Ambulatory Visit: Payer: Medicare Other | Attending: Internal Medicine

## 2021-04-12 DIAGNOSIS — Z23 Encounter for immunization: Secondary | ICD-10-CM

## 2021-04-12 MED ORDER — PFIZER COVID-19 VAC BIVALENT 30 MCG/0.3ML IM SUSP
INTRAMUSCULAR | 0 refills | Status: DC
  Filled 2021-04-12: qty 0.3, 1d supply, fill #0

## 2021-04-12 NOTE — Progress Notes (Signed)
   Covid-19 Vaccination Clinic  Name:  David [name]    MRN: [id number] DOB: Sep 17, 1950  04/12/2021  David [name] was observed post Covid-19 immunization for 15 minutes without incident. He was provided with Vaccine Information Sheet and instruction to access the V-Safe system.   David [name] was instructed to call 911 with any severe reactions post vaccine: Difficulty breathing  Swelling of face and throat  A fast heartbeat  A bad rash all over body  Dizziness and weakness   Immunizations Administered     Name Date Dose VIS Date Route   Pfizer Covid-19 Vaccine Bivalent Booster 04/12/2021 11:28 AM 0.3 mL 01/03/2021 Intramuscular   Manufacturer: [place]   Lot: [id number]   [place]: [phone]

## 2021-05-09 ENCOUNTER — Other Ambulatory Visit: Payer: Self-pay | Admitting: Internal Medicine

## 2021-05-09 NOTE — Telephone Encounter (Signed)
Please refill as per office routine med refill policy (all routine meds to be refilled for 3 mo or monthly (per pt preference) up to one year from last visit, then month to month grace period for 3 mo, then further med refills will have to be denied) ? ?

## 2021-05-24 ENCOUNTER — Encounter: Payer: Self-pay | Admitting: Internal Medicine

## 2021-05-24 ENCOUNTER — Ambulatory Visit (INDEPENDENT_AMBULATORY_CARE_PROVIDER_SITE_OTHER): Payer: Medicare Other | Admitting: Internal Medicine

## 2021-05-24 ENCOUNTER — Other Ambulatory Visit: Payer: Self-pay

## 2021-05-24 VITALS — BP 138/70 | HR 64 | Temp 97.7°F | Ht 70.0 in | Wt 247.0 lb

## 2021-05-24 DIAGNOSIS — E559 Vitamin D deficiency, unspecified: Secondary | ICD-10-CM | POA: Diagnosis not present

## 2021-05-24 DIAGNOSIS — I10 Essential (primary) hypertension: Secondary | ICD-10-CM | POA: Diagnosis not present

## 2021-05-24 DIAGNOSIS — R972 Elevated prostate specific antigen [PSA]: Secondary | ICD-10-CM | POA: Diagnosis not present

## 2021-05-24 DIAGNOSIS — H903 Sensorineural hearing loss, bilateral: Secondary | ICD-10-CM | POA: Diagnosis not present

## 2021-05-24 DIAGNOSIS — H9193 Unspecified hearing loss, bilateral: Secondary | ICD-10-CM | POA: Insufficient documentation

## 2021-05-24 DIAGNOSIS — N183 Chronic kidney disease, stage 3 unspecified: Secondary | ICD-10-CM | POA: Diagnosis not present

## 2021-05-24 DIAGNOSIS — E538 Deficiency of other specified B group vitamins: Secondary | ICD-10-CM | POA: Diagnosis not present

## 2021-05-24 DIAGNOSIS — R809 Proteinuria, unspecified: Secondary | ICD-10-CM | POA: Diagnosis not present

## 2021-05-24 DIAGNOSIS — H9191 Unspecified hearing loss, right ear: Secondary | ICD-10-CM | POA: Insufficient documentation

## 2021-05-24 DIAGNOSIS — E1121 Type 2 diabetes mellitus with diabetic nephropathy: Secondary | ICD-10-CM

## 2021-05-24 LAB — BASIC METABOLIC PANEL
BUN: 30 mg/dL — ABNORMAL HIGH (ref 6–23)
CO2: 27 mEq/L (ref 19–32)
Calcium: 10.4 mg/dL (ref 8.4–10.5)
Chloride: 99 mEq/L (ref 96–112)
Creatinine, Ser: 1.5 mg/dL (ref 0.40–1.50)
GFR: 46.18 mL/min — ABNORMAL LOW (ref >60.00)
Glucose, Bld: 161 mg/dL — ABNORMAL HIGH (ref 70–99)
Potassium: 4.3 mEq/L (ref 3.5–5.1)
Sodium: 138 mEq/L (ref 135–145)

## 2021-05-24 LAB — HEMOGLOBIN A1C: Hgb A1c MFr Bld: 6.5 % (ref 4.6–6.5)

## 2021-05-24 LAB — CBC WITH DIFFERENTIAL/PLATELET
Basophils Absolute: 0.1 10*3/uL (ref 0.0–0.1)
Basophils Relative: 1.3 % (ref 0.0–3.0)
Eosinophils Absolute: 0.4 10*3/uL (ref 0.0–0.7)
Eosinophils Relative: 7.4 % — ABNORMAL HIGH (ref 0.0–5.0)
HCT: 38.2 % — ABNORMAL LOW (ref 39.0–52.0)
Hemoglobin: 13 g/dL (ref 13.0–17.0)
Lymphocytes Relative: 14 % (ref 12.0–46.0)
Lymphs Abs: 0.8 10*3/uL (ref 0.7–4.0)
MCHC: 34 g/dL (ref 30.0–36.0)
MCV: 98 fl (ref 78.0–100.0)
Monocytes Absolute: 0.6 10*3/uL (ref 0.1–1.0)
Monocytes Relative: 10.4 % (ref 3.0–12.0)
Neutro Abs: 3.8 10*3/uL (ref 1.4–7.7)
Neutrophils Relative %: 66.9 % (ref 43.0–77.0)
Platelets: 224 10*3/uL (ref 150.0–400.0)
RBC: 3.9 Mil/uL — ABNORMAL LOW (ref 4.22–5.81)
RDW: 12.1 % (ref 11.5–15.5)
WBC: 5.7 10*3/uL (ref 4.0–10.5)

## 2021-05-24 LAB — HEPATIC FUNCTION PANEL
ALT: 24 U/L (ref 0–53)
AST: 38 U/L — ABNORMAL HIGH (ref 0–37)
Albumin: 4.7 g/dL (ref 3.5–5.2)
Alkaline Phosphatase: 37 U/L — ABNORMAL LOW (ref 39–117)
Bilirubin, Direct: 0.2 mg/dL (ref 0.0–0.3)
Total Bilirubin: 0.7 mg/dL (ref 0.2–1.2)
Total Protein: 8.2 g/dL (ref 6.0–8.3)

## 2021-05-24 LAB — VITAMIN D 25 HYDROXY (VIT D DEFICIENCY, FRACTURES): VITD: 36.16 ng/mL (ref 30.00–100.00)

## 2021-05-24 LAB — LIPID PANEL
Cholesterol: 134 mg/dL (ref 0–200)
HDL: 40.8 mg/dL (ref >39.00)
LDL Cholesterol: 53 mg/dL (ref 0–99)
NonHDL: 92.95
Total CHOL/HDL Ratio: 3
Triglycerides: 200 mg/dL — ABNORMAL HIGH (ref 0.0–149.0)
VLDL: 40 mg/dL (ref 0.0–40.0)

## 2021-05-24 LAB — VITAMIN B12: Vitamin B-12: 694 pg/mL (ref 211–911)

## 2021-05-24 LAB — PSA: PSA: 2.17 ng/mL (ref 0.10–4.00)

## 2021-05-24 LAB — TSH: TSH: 2.07 u[IU]/mL (ref 0.35–5.50)

## 2021-05-24 NOTE — Progress Notes (Signed)
Patient ID: David [name], male   DOB: 1950-08-17, 71 y.o.   MRN: [id number]

## 2021-05-24 NOTE — Assessment & Plan Note (Signed)
Lab Results  Component Value Date   VITAMINB12 190 (L) 07/21/2020   Low, to start oral replacement - b12 1000 mcg qd

## 2021-05-24 NOTE — Progress Notes (Signed)
Patient ID: David [name], male   DOB: [birth year], 71 y.o.   MRN: [id number]         Chief Complaint:: yearly exam       HPI:  David [name] is a 71 y.o. male here overall doing ok, Pt denies chest pain, increased sob or doe, wheezing, orthopnea, PND, increased LE swelling, palpitations, dizziness or syncope.   Pt denies polydipsia, polyuria, or low sugar symptoms .  Has f/u with renal in 1 wk with hx of nephrotic range proteinuria and ckd 3.  Not taking B12 or Vit d.  Has persistent now worsening at least moderate bilateral hearing loss.  Denies urinary symptoms such as dysuria, frequency, urgency, flank pain, hematuria or n/v, fever, chills.  No other new complaints   Wt Readings from Last 3 Encounters:  05/24/21 247 lb (112 kg)  03/07/21 246 lb 9.6 oz (111.9 kg)  12/01/20 248 lb (112.5 kg)   BP Readings from Last 3 Encounters:  05/24/21 138/70  03/07/21 (!) 144/80  12/01/20 128/70   Immunization History  Administered Date(s) Administered   Fluad Quad(high Dose 65+) 12/23/2018, 02/16/2020, 03/22/2021   Influenza, High Dose Seasonal PF 03/03/2018   PFIZER Comirnaty(Gray Top)Covid-19 Tri-Sucrose Vaccine 08/18/2020   PFIZER(Purple Top)SARS-COV-2 Vaccination 06/11/2019, 07/06/2019, 03/17/2020   Pfizer Covid-19 Vaccine Bivalent Booster 69yrs & up 04/12/2021   Pneumococcal Conjugate-13 08/18/2014   Pneumococcal Polysaccharide-23 07/19/2008, 07/21/2013, 01/13/2020   Td 07/19/2008   Tdap 12/23/2018   Zoster Recombinat (Shingrix) 03/30/2019, 05/17/2019   Zoster, Live 11/22/2011   There are no preventive care reminders to display for this patient.     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   Anticardiolipin antibody positive    Anxiety    ANXIETY 07/19/2008   CEREBROVASCULAR ACCIDENT, HX OF 07/19/2008   CVA (cerebral vascular accident) ([place]) 2003    Depression    DEPRESSION 07/19/2008   Diabetes mellitus    type II   DIABETES MELLITUS, TYPE II 01/18/2008   Diverticulosis    GERD 07/19/2008   GERD (gastroesophageal reflux disease)    Hemorrhoids    Hx of adenomatous colonic polyps    Hyperlipidemia    HYPERLIPIDEMIA 07/19/2008   Hypertension    HYPERTENSION 07/31/2009   Increased prostate specific antigen (PSA) velocity 09/13/2010   Internal hemorrhoids    Mitral regurgitation    Echo 9/21: EF 55-60, no RWMA, normal RVSF, mild-moderate LAE, mild-moderate MR, mild aortic stenosis (mean gradient 9.2 mmHg)   Perineal abscess    Peripheral vascular disease ([place])    peripheral vascular disease/carotid artery <100%   PERSONAL HX COLONIC POLYPS 12/10/2007   Unspecified Peripheral Vascular Disease 01/18/2008   Varicose veins    Past Surgical History:  Procedure Laterality Date   7 abdominal hernia repairs  05/31/10   ABDOMINAL AORTAGRAM N/A 01/12/2013   Procedure: ABDOMINAL Maxcine Ham;  Surgeon: Serafina Mitchell, MD;  Location[facility] C[facility] LAB;  Service: Cardiovascular;  Laterality: N/A;   ABDOMINAL AORTIC ANEURYSM REPAIR     ABDOMINAL AORTOGRAM W/LOWER EXTREMITY Bilateral 10/05/2019   Procedure: ABDOMINAL AORTOGRAM W/LOWER EXTREMITY;  Surgeon: Serafina Mitchell, MD;  Location: [place] CV LAB;  Service: Cardiovascular;  Laterality: Bilateral;   COLONOSCOPY     hernia surgury     71 years old   perineal abscess     PERIPHERAL VASCULAR CATHETERIZATION N/A 11/23/2014  Procedure: Abdominal Aortogram;  Surgeon: Serafina Mitchell, MD;  Location: [place] CV LAB;  Service: Cardiovascular;  Laterality: N/A;   PERIPHERAL VASCULAR CATHETERIZATION  11/23/2014   Procedure: Lower Extremity Angiography;  Surgeon: Serafina Mitchell, MD;  Location: [place] CV LAB;  Service: Cardiovascular;;   PERIPHERAL VASCULAR CATHETERIZATION  11/23/2014   Procedure: Peripheral Vascular Intervention;  Surgeon: Serafina Mitchell, MD;  Location: [place] CV LAB;  Service:  Cardiovascular;;  PTA bilateral common iliac PTA/DCB left fem-pop   POLYPECTOMY     s/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 His smoking use included cigarettes. He has never used smokeless tobacco. He reports current alcohol use of about 2.0 standard drinks per week. He reports that he does not use drugs. family history includes Aortic aneurysm in his mother; Cancer in his brother. Allergies  Allergen Reactions   Latex    Latex Rash   Current Outpatient Medications on File Prior to Visit  Medication Sig Dispense Refill   amLODipine (NORVASC) 5 MG tablet TAKE (2) TABLETS BY MOUTH DAILY. (Patient taking differently: Take 10 mg by mouth daily.) 60 tablet 0   apixaban (ELIQUIS) 5 MG TABS tablet Take 1 tablet (5 mg total) by mouth 2 (two) times daily. Please call the office to schedule your Cardiology follow up. Thanks. 180 tablet 1   Ascorbic Acid (VITAMIN C) 1000 MG tablet Take 1,000 mg by mouth daily.     atorvastatin (LIPITOR) 40 MG tablet TAKE ONE TABLET BY MOUTH ONCE DAILY 90 tablet 1   cetirizine (ZYRTEC) 10 MG tablet Take 1 tablet (10 mg total) by mouth daily. 90 tablet 3   Cholecalciferol (VITAMIN D) 50 MCG (2000 UT) tablet Take 2,000 Units by mouth daily.     clopidogrel (PLAVIX) 75 MG tablet TAKE ONE TABLET BY MOUTH EVERY DAY 90 tablet 1   docusate sodium (COLACE) 100 MG capsule Take 100 mg by mouth 2 (two) times daily.     famotidine (PEPCID) 20 MG tablet Take 20 mg by mouth 2 (two) times daily.     FARXIGA 10 MG TABS tablet Take 10 mg by mouth daily.     folic acid (FOLVITE) 562 MCG tablet Take 800 mcg by mouth daily.     furosemide (LASIX) 40 MG tablet Take 40 mg by mouth daily.     gemfibrozil (LOPID) 600 MG tablet TAKE TWO TABLETS BY MOUTH ONCE DAILY 180 tablet 1   metFORMIN (GLUCOPHAGE-XR) 500 MG 24 hr tablet TAKE 4 TABLETS EVERY MORNING 120 tablet 0   metroNIDAZOLE (METROGEL) 0.75 %  gel Apply 1 application topically every other day.      Multiple Vitamin (MULTIVITAMIN) capsule Take 1 capsule by mouth daily.     olmesartan (BENICAR) 40 MG tablet Take 1 tablet (40 mg total) by mouth daily. Follow-up appt due in August must see provider for future refills 90 tablet 0   Omega-3 Fatty Acids (FISH OIL) 1000 MG CAPS Take 1,000 mg by mouth daily.     psyllium (FIBER LAXATIVE) 0.52 g capsule Take 3.36 g by mouth daily with lunch.     triamcinolone (NASACORT) 55 MCG/ACT AERO nasal inhaler Place 2 sprays into the nose daily. (Patient taking differently: Place 1 spray into the nose daily as needed (allergies).) 1 Inhaler 12   No current facility-administered medications on file prior to visit.  ROS:  All others reviewed and negative.  Objective        PE:  BP 138/70 (BP Location: Right Arm, Patient Position: Sitting, Cuff Size: Large)    Pulse 64    Temp 97.7 F (36.5 C) (Oral)    Ht 5\' 10"  (1.778 m)    Wt 247 lb (112 kg)    SpO2 99%    BMI 35.44 kg/m                 Constitutional: Pt appears in NAD               HENT: Head: NCAT.                Right Ear: External ear normal.                 Left Ear: External ear normal.                Eyes: . Pupils are equal, round, and reactive to light. Conjunctivae and EOM are normal               Nose: without d/c or deformity               Neck: Neck supple. Gross normal ROM               Cardiovascular: Normal rate and regular rhythm.                 Pulmonary/Chest: Effort normal and breath sounds without rales or wheezing.                Abd:  Soft, NT, ND, + BS, no organomegaly               Neurological: Pt is alert. At baseline orientation, motor grossly intact               Skin: Skin is warm. No rashes, no other new lesions, LE edema - none               Psychiatric: Pt behavior is normal without agitation   Micro: none  Cardiac tracings I have personally interpreted today:  none  Pertinent Radiological findings  (summarize): none   Lab Results  Component Value Date   WBC 5.7 05/24/2021   HGB 13.0 05/24/2021   HCT 38.2 (L) 05/24/2021   PLT 224.0 05/24/2021   GLUCOSE 161 (H) 05/24/2021   CHOL 134 05/24/2021   TRIG 200.0 (H) 05/24/2021   HDL 40.80 05/24/2021   LDLDIRECT 56.0 12/22/2018   LDLCALC 53 05/24/2021   ALT 24 05/24/2021   AST 38 (H) 05/24/2021   NA 138 05/24/2021   K 4.3 05/24/2021   CL 99 05/24/2021   CREATININE 1.50 05/24/2021   BUN 30 (H) 05/24/2021   CO2 27 05/24/2021   TSH 2.07 05/24/2021   PSA 2.17 05/24/2021   INR 1.17 10/16/2017   HGBA1C 6.5 05/24/2021   MICROALBUR 173.4 (H) 11/12/2016   Assessment/Plan:  David [name] is a 71 y.o. 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[place]) (09/07/2008), Anticardiolipin antibody positive, Anxiety, ANXIETY (07/19/2008), CEREBROVASCULAR ACCIDENT, HX OF (07/19/2008), CVA (cerebral vascular accident) ([place]) (2003), Depression, DEPRESSION (07/19/2008), Diabetes mellitus, DIABETES MELLITUS, TYPE II (01/18/2008), Diverticulosis, GERD (07/19/2008), GERD (gastroesophageal reflux disease), Hemorrhoids, adenomatous colonic polyps, Hyperlipidemia, HYPERLIPIDEMIA (07/19/2008), Hypertension, HYPERTENSION (07/31/2009), Increased prostate specific antigen (  PSA) velocity (09/13/2010), Internal hemorrhoids, Mitral regurgitation, Perineal abscess, Peripheral vascular disease ([place]), PERSONAL HX COLONIC POLYPS (12/10/2007), Unspecified Peripheral Vascular Disease (01/18/2008), and Varicose veins.  B12 deficiency Lab Results  Component Value Date   VITAMINB12 190 (L) 07/21/2020   Low, to start oral replacement - b12 1000 mcg qd   Bilateral hearing loss Pt encouraged to seek OTC hearing aides  CKD (chronic kidney disease) Lab Results  Component Value Date   CREATININE 1.50 05/24/2021   Stable overall, cont to avoid  nephrotoxins   Diabetes ([place]) Lab Results  Component Value Date   HGBA1C 6.5 05/24/2021   Stable, pt to continue current medical treatment metfomrin, farxiga   Increased prostate specific antigen (PSA) velocity Lab Results  Component Value Date   PSA 2.17 05/24/2021   PSA 2.38 07/21/2020   PSA 1.8 01/13/2020   Stable overall, cont to monitor  Nephrotic range proteinuria Pt conts to follow with renal  Vitamin D deficiency Last vitamin D Lab Results  Component Value Date   VD25OH 36.16 05/24/2021   Low, to start oral replacement   Essential hypertension BP Readings from Last 3 Encounters:  05/24/21 138/70  03/07/21 (!) 144/80  12/01/20 128/70   Stable, pt to continue medical treatment amlodipine, benicar  Followup: Return in about 6 months (around 11/21/2021).  Cathlean Cower, MD 05/27/2021 7:07 PM [facility]

## 2021-05-24 NOTE — Assessment & Plan Note (Signed)
Pt encouraged to seek OTC hearing aides

## 2021-05-24 NOTE — Patient Instructions (Signed)
Please continue all other medications as before, though we may be able to cut back on the B12 to mon-wed-fri if your B12 level today is ok or high  Please have the pharmacy call with any other refills you may need.  Please continue your efforts at being more active, low cholesterol diet, and weight control.  You are otherwise up to date with prevention measures today.  Please keep your appointments with your specialists as you may have planned  Please go to the LAB at the blood drawing area for the tests to be done  You will be contacted by phone if any changes need to be made immediately.  Otherwise, you will receive a letter about your results with an explanation, but please check with MyChart first.  Please remember to sign up for MyChart if you have not done so, as this will be important to you in the future with finding out test results, communicating by private email, and scheduling acute appointments online when needed.  Please make an Appointment to return in 6 months, or sooner if needed

## 2021-05-27 ENCOUNTER — Encounter: Payer: Self-pay | Admitting: Internal Medicine

## 2021-05-27 NOTE — Assessment & Plan Note (Signed)
Lab Results  Component Value Date   CREATININE 1.50 05/24/2021   Stable overall, cont to avoid nephrotoxins

## 2021-05-27 NOTE — Assessment & Plan Note (Signed)
BP Readings from Last 3 Encounters:  05/24/21 138/70  03/07/21 (!) 144/80  12/01/20 128/70   Stable, pt to continue medical treatment amlodipine, benicar

## 2021-05-27 NOTE — Assessment & Plan Note (Signed)
Pt conts to follow with renal

## 2021-05-27 NOTE — Assessment & Plan Note (Signed)
Lab Results  Component Value Date   HGBA1C 6.5 05/24/2021   Stable, pt to continue current medical treatment metfomrin, farxiga

## 2021-05-27 NOTE — Assessment & Plan Note (Signed)
Lab Results  Component Value Date   PSA 2.17 05/24/2021   PSA 2.38 07/21/2020   PSA 1.8 01/13/2020   Stable overall, cont to monitor

## 2021-05-27 NOTE — Assessment & Plan Note (Signed)
Last vitamin D Lab Results  Component Value Date   VD25OH 36.16 05/24/2021   Low, to start oral replacement  

## 2021-05-28 ENCOUNTER — Other Ambulatory Visit: Payer: Self-pay | Admitting: Cardiovascular Disease

## 2021-05-28 DIAGNOSIS — I4819 Other persistent atrial fibrillation: Secondary | ICD-10-CM

## 2021-05-28 NOTE — Telephone Encounter (Signed)
Eliquis 5mg  refill request received. Patient is 71 years old, weight-112kg, Crea-1.50 on 05/24/2021, Diagnosis-Afib, and last seen by Ermalinda Barrios on 03/07/2021. Dose is appropriate based on dosing criteria. Will send in refill to requested pharmacy.

## 2021-05-31 DIAGNOSIS — N1831 Chronic kidney disease, stage 3a: Secondary | ICD-10-CM | POA: Diagnosis not present

## 2021-05-31 DIAGNOSIS — E559 Vitamin D deficiency, unspecified: Secondary | ICD-10-CM | POA: Diagnosis not present

## 2021-05-31 DIAGNOSIS — R609 Edema, unspecified: Secondary | ICD-10-CM | POA: Diagnosis not present

## 2021-05-31 DIAGNOSIS — R809 Proteinuria, unspecified: Secondary | ICD-10-CM | POA: Diagnosis not present

## 2021-05-31 DIAGNOSIS — E1122 Type 2 diabetes mellitus with diabetic chronic kidney disease: Secondary | ICD-10-CM | POA: Diagnosis not present

## 2021-05-31 DIAGNOSIS — I129 Hypertensive chronic kidney disease with stage 1 through stage 4 chronic kidney disease, or unspecified chronic kidney disease: Secondary | ICD-10-CM | POA: Diagnosis not present

## 2021-06-10 ENCOUNTER — Other Ambulatory Visit: Payer: Self-pay | Admitting: Internal Medicine

## 2021-06-10 NOTE — Telephone Encounter (Signed)
Please refill as per office routine med refill policy (all routine meds to be refilled for 3 mo or monthly (per pt preference) up to one year from last visit, then month to month grace period for 3 mo, then further med refills will have to be denied) ? ?

## 2021-07-01 ENCOUNTER — Other Ambulatory Visit: Payer: Self-pay

## 2021-07-01 ENCOUNTER — Emergency Department (HOSPITAL_COMMUNITY)
Admission: EM | Admit: 2021-07-01 | Discharge: 2021-07-01 | Disposition: A | Discharge status: Home / Self Care | Payer: Medicare Other | Attending: Emergency Medicine | Admitting: Emergency Medicine

## 2021-07-01 ENCOUNTER — Encounter (HOSPITAL_COMMUNITY): Payer: Self-pay

## 2021-07-01 DIAGNOSIS — Z91040 Latex allergy status: Secondary | ICD-10-CM | POA: Insufficient documentation

## 2021-07-01 DIAGNOSIS — I10 Essential (primary) hypertension: Secondary | ICD-10-CM | POA: Insufficient documentation

## 2021-07-01 DIAGNOSIS — E119 Type 2 diabetes mellitus without complications: Secondary | ICD-10-CM | POA: Diagnosis not present

## 2021-07-01 DIAGNOSIS — Z7901 Long term (current) use of anticoagulants: Secondary | ICD-10-CM | POA: Insufficient documentation

## 2021-07-01 DIAGNOSIS — Z7902 Long term (current) use of antithrombotics/antiplatelets: Secondary | ICD-10-CM | POA: Diagnosis not present

## 2021-07-01 DIAGNOSIS — I83891 Varicose veins of right lower extremities with other complications: Secondary | ICD-10-CM | POA: Insufficient documentation

## 2021-07-01 DIAGNOSIS — I83899 Varicose veins of unspecified lower extremities with other complications: Secondary | ICD-10-CM

## 2021-07-01 DIAGNOSIS — I4891 Unspecified atrial fibrillation: Secondary | ICD-10-CM | POA: Insufficient documentation

## 2021-07-01 DIAGNOSIS — Z7984 Long term (current) use of oral hypoglycemic drugs: Secondary | ICD-10-CM | POA: Diagnosis not present

## 2021-07-01 DIAGNOSIS — R58 Hemorrhage, not elsewhere classified: Secondary | ICD-10-CM | POA: Diagnosis not present

## 2021-07-01 DIAGNOSIS — X58XXXA Exposure to other specified factors, initial encounter: Secondary | ICD-10-CM | POA: Insufficient documentation

## 2021-07-01 DIAGNOSIS — Z79899 Other long term (current) drug therapy: Secondary | ICD-10-CM | POA: Diagnosis not present

## 2021-07-01 DIAGNOSIS — S81811A Laceration without foreign body, right lower leg, initial encounter: Secondary | ICD-10-CM | POA: Insufficient documentation

## 2021-07-01 DIAGNOSIS — S6991XA Unspecified injury of right wrist, hand and finger(s), initial encounter: Secondary | ICD-10-CM | POA: Diagnosis present

## 2021-07-01 MED ORDER — LIDOCAINE-EPINEPHRINE (PF) 2 %-1:200000 IJ SOLN
INTRAMUSCULAR | Status: AC
  Filled 2021-07-01: qty 20

## 2021-07-01 MED ORDER — LIDOCAINE-EPINEPHRINE (PF) 2 %-1:200000 IJ SOLN
20.0000 mL | Freq: Once | INTRAMUSCULAR | Status: DC
  Filled 2021-07-01: qty 20

## 2021-07-01 NOTE — Discharge Instructions (Addendum)
Please keep dry dressing on bleeding area. Keep area dry for one week. Avoid any strenuous activity until your stitch is removed. Please return to an urgent care in 7 days for suture removal. If it starts bleeding again, hold pressure on the wound to see if you get it to stop. If it does not stop bleeding, you should return to be evaluated again.

## 2021-07-01 NOTE — ED Triage Notes (Signed)
"  Right lower leg had small blister that bursted and he couldn't get the bleeding to stop due to having a coagulation disorder and being on blood thinners. We were able to control the bleeding with a dressing" per EMS No other complaints

## 2021-07-01 NOTE — ED Provider Notes (Signed)
[facility] DEPT Provider Note   CSN: [id number] Arrival date & time: 07/01/21  1137     History  Chief Complaint  Patient presents with   Coagulation Disorder    David [name] is a 71 y.o. male.  Past medical history includes diabetes, hyperlipidemia, hypertension, history of CVA, atrial fibrillation, antiphospholipid syndrome, CKD, anxiety, AAA, varicose veins, mitral regurgitation.  Patient presents to the emergent care today for bleeding from a wound.  He states that blood blister on the shin of his right leg for several months.  About 1 week ago its burst open and started bleeding.  They were able to control the bleeding at that time and put a bandage over it.  He states that this morning he got out of the shower and the bleeding restarted.  Per patient and his wife, the bleeding was shooting out and was bright red blood.  They put pressure on it could not get it to stop.  They did call EMS at this time.  EMS put a dressing on it and the bleeding stopped.  Patient is on Eliquis and Plavix.  HPI     Home Medications Prior to Admission medications   Medication Sig Start Date End Date Taking? Authorizing Provider  metFORMIN (GLUCOPHAGE-XR) 500 MG 24 hr tablet TAKE 4 TABLETS BY MOUTH EVERY MORNING 06/11/21   Biagio Borg, MD  amLODipine (NORVASC) 5 MG tablet TAKE (2) TABLETS BY MOUTH DAILY. Patient taking differently: Take 10 mg by mouth daily. 03/09/18   Biagio Borg, MD  apixaban (ELIQUIS) 5 MG TABS tablet Take 1 tablet (5 mg total) by mouth 2 (two) times daily. 05/28/21   [name], MD  Ascorbic Acid (VITAMIN C) 1000 MG tablet Take 1,000 mg by mouth daily.    [provider]  atorvastatin (LIPITOR) 40 MG tablet TAKE ONE TABLET BY MOUTH ONCE DAILY 04/09/21   Biagio Borg, MD  cetirizine (ZYRTEC) 10 MG tablet Take 1 tablet (10 mg total) by mouth daily. 08/18/14   Biagio Borg, MD  Cholecalciferol (VITAMIN D) 50 MCG (2000 UT) tablet  Take 2,000 Units by mouth daily.    [provider]  clopidogrel (PLAVIX) 75 MG tablet TAKE ONE TABLET BY MOUTH EVERY DAY 04/09/21   Biagio Borg, MD  docusate sodium (COLACE) 100 MG capsule Take 100 mg by mouth 2 (two) times daily.    [provider]  famotidine (PEPCID) 20 MG tablet Take 20 mg by mouth 2 (two) times daily.    [provider]  FARXIGA 10 MG TABS tablet Take 10 mg by mouth daily. 05/17/20   [provider]  folic acid (FOLVITE) 026 MCG tablet Take 800 mcg by mouth daily.    [provider]  furosemide (LASIX) 40 MG tablet Take 40 mg by mouth daily.    [provider]  gemfibrozil (LOPID) 600 MG tablet TAKE TWO TABLETS BY MOUTH ONCE DAILY 04/09/21   Biagio Borg, MD  metroNIDAZOLE (METROGEL) 0.75 % gel Apply 1 application topically every other day.  10/09/14   [provider]  Multiple Vitamin (MULTIVITAMIN) capsule Take 1 capsule by mouth daily.    [provider]  olmesartan (BENICAR) 40 MG tablet Take 1 tablet (40 mg total) by mouth daily. Follow-up appt due in August must see provider for future refills 10/05/18   Biagio Borg, MD  Omega-3 Fatty Acids (FISH OIL) 1000 MG CAPS Take 1,000 mg by mouth daily.  [provider]  psyllium (FIBER LAXATIVE) 0.52 g capsule Take 3.36 g by mouth daily with lunch.    [provider]  triamcinolone (NASACORT) 55 MCG/ACT AERO nasal inhaler Place 2 sprays into the nose daily. Patient taking differently: Place 1 spray into the nose daily as needed (allergies). 06/18/17   Biagio Borg, MD      Allergies    Latex and Latex    Review of Systems   Review of Systems  Skin:  Positive for wound.  All other systems reviewed and are negative.  Physical Exam Updated Vital Signs BP (!) 174/78 (BP Location: Right Arm)    Pulse 72    Temp 97.8 F (36.6 C) (Oral)    Resp 20    Ht 5\' 10"  (1.778 m)    Wt 108.9 kg    SpO2 100%    BMI 34.44 kg/m  Physical  Exam Vitals and nursing note reviewed.  Constitutional:      General: He is not in acute distress.    Appearance: Normal appearance. He is well-developed. He is not ill-appearing, toxic-appearing or diaphoretic.  HENT:     Head: Normocephalic and atraumatic.     Nose: No nasal deformity.     Mouth/Throat:     Lips: Pink. No lesions.  Eyes:     General: Gaze aligned appropriately. No scleral icterus.       Right eye: No discharge.        Left eye: No discharge.     Conjunctiva/sclera: Conjunctivae normal.     Right eye: Right conjunctiva is not injected. No exudate or hemorrhage.    Left eye: Left conjunctiva is not injected. No exudate or hemorrhage. Pulmonary:     Effort: Pulmonary effort is normal. No respiratory distress.  Skin:    General: Skin is warm and dry.          Comments: Area was pointing to the wound.  Wound is round and appears clean.  Does not appear to have surrounding cellulitis..  I took off the pressure dressing, and blood continued to be shooting out.  Neurological:     Mental Status: He is alert and oriented to person, place, and time.  Psychiatric:        Mood and Affect: Mood normal.        Speech: Speech normal.        Behavior: Behavior normal. Behavior is cooperative.    ED Results / Procedures / Treatments   Labs (all labs ordered are listed, but only abnormal results are displayed) Labs Reviewed - No data to display  EKG None  Radiology No results found.  Procedures .Marland KitchenLaceration Repair  Date/Time: 07/01/2021 3:21 PM Performed by: Adolphus Birchwood, PA-C Authorized by: Adolphus Birchwood, PA-C   Consent:    Consent obtained:  Verbal   Consent given by:  Patient   Risks, benefits, and alternatives were discussed: yes     Risks discussed:  Pain and infection   Alternatives discussed:  No treatment, delayed treatment and observation Universal protocol:    Procedure explained and questions answered to patient or proxy's satisfaction: yes      Patient identity confirmed:  Verbally with patient Anesthesia:    Anesthesia method:  Local infiltration   Local anesthetic:  Lidocaine 2% WITH epi Laceration details:    Location:  Leg   Leg location:  R lower leg Exploration:    Hemostasis achieved with:  Direct pressure, tied off vessels and epinephrine  Skin repair:    Repair method:  Sutures   Suture size:  4-0   Suture material:  Prolene   Suture technique:  Figure eight   Number of sutures:  1 Approximation:    Approximation:  Close Repair type:    Repair type:  Intermediate Post-procedure details:    Dressing:  Adhesive bandage   Procedure completion:  Tolerated well, no immediate complications   Medications Ordered in ED Medications  lidocaine-EPINEPHrine (XYLOCAINE W/EPI) 2 %-1:200000 (PF) injection 20 mL (has no administration in time range)  lidocaine-EPINEPHrine (XYLOCAINE W/EPI) 2 %-1:200000 (PF) injection (has no administration in time range)    ED Course/ Medical Decision Making/ A&P                           Medical Decision Making Problems Addressed: Bleeding from varicose vein: acute illness or injury that poses a threat to life or bodily functions  Amount and/or Complexity of Data Reviewed External Data Reviewed: notes.  Risk Prescription drug management.    MDM  This is a 71 y.o. male with a pertinent PMH of diabetes, hyperlipidemia, hypertension, history of CVA, atrial fibrillation, antiphospholipid syndrome, CKD, anxiety, AAA, varicose veins, mitral regurgitation who presents to the ED with a bleeding varicose vein, on Eliquis and Plavix.  Bleeding was controlled by EMS with pressure dressing.  When I took this dressing off, blood continued to shoot out.  I think this is a bleeding varicose vein.  I will proceed with a figure-of-eight dressing to try to control the bleeding.  1300: Figure 8 stitch placed.  There was some slight oozing on the site.  Will put dressing on it and recheck in about  20 to 30 minutes to see if bleeding is controlled  1330: Reassessed suture and bleeding has stopped. No breakthrough bleeding noted. I placed a pressure dressing on this. I think that this patient is stable for discharge. I have provided him with discharge instructions to keep the area dry. Have suture removed in one week. If bleeding restarts, he needs to hold pressure. If this does not control bleeding, he will need to come back to the ED.    Charting Requirements Additional History obtained from wife. External Records from outside source obtained and reviewed including: Recent office visit in January. Patient not having varicose vein symptoms at this time Social Determinants of Health:  n/a PMH that complicates this patients presentation: On plavix and Eliquis, hx of antiphospholipid syndrome, hx of varicose veins, hx of atrial fibrillation Critical Care Interventions: suture of varicose vein Disposition: Bleeding controlled with suture. He can be discharged home with return precautions. Suture removal in 7 days.  I have discussed this patient with my attending physician, Dr. Dondra Spry who has made changes to the plan accordingly.  Portions of this note were generated with [employer]. Dictation errors may occur despite best attempts at proofreading.   Final Clinical Impression(s) / ED Diagnoses Final diagnoses:  Bleeding from varicose vein    Rx / DC Orders ED Discharge Orders     None         Adolphus Birchwood, PA-C 07/01/21 1523    Curatolo, Adam, DO 07/02/21 1030

## 2021-07-01 NOTE — ED Notes (Signed)
No bleeding. No complaints. A&O x 4. Ambulatory without assistance. Gait steady. Spouse at bedside. Dressing in place to right lower leg by provider.

## 2021-07-10 ENCOUNTER — Encounter: Payer: Self-pay | Admitting: Internal Medicine

## 2021-07-10 ENCOUNTER — Other Ambulatory Visit: Payer: Self-pay

## 2021-07-10 ENCOUNTER — Ambulatory Visit (INDEPENDENT_AMBULATORY_CARE_PROVIDER_SITE_OTHER): Payer: Medicare Other | Admitting: Internal Medicine

## 2021-07-10 VITALS — BP 132/72 | HR 61 | Ht 70.0 in | Wt 246.0 lb

## 2021-07-10 DIAGNOSIS — I83899 Varicose veins of unspecified lower extremities with other complications: Secondary | ICD-10-CM | POA: Diagnosis not present

## 2021-07-10 DIAGNOSIS — E559 Vitamin D deficiency, unspecified: Secondary | ICD-10-CM | POA: Diagnosis not present

## 2021-07-10 DIAGNOSIS — E1121 Type 2 diabetes mellitus with diabetic nephropathy: Secondary | ICD-10-CM | POA: Diagnosis not present

## 2021-07-10 DIAGNOSIS — I10 Essential (primary) hypertension: Secondary | ICD-10-CM | POA: Diagnosis not present

## 2021-07-10 NOTE — Assessment & Plan Note (Signed)
BP Readings from Last 3 Encounters:  ?07/10/21 132/72  ?07/01/21 (!) 174/78  ?05/24/21 138/70  ? ?Stable, pt to continue medical treatment norvasc, benicar ? ?

## 2021-07-10 NOTE — Patient Instructions (Signed)
Your stitch was removed today ? ?Please continue all other medications as before, and refills have been done if requested. ? ?Please have the pharmacy call with any other refills you may need. ? ?Please continue your efforts at being more active, low cholesterol diet, and weight control ? ?Please keep your appointments with your specialists as you may have planned ? ? ? ?

## 2021-07-10 NOTE — Assessment & Plan Note (Signed)
Lab Results  ?Component Value Date  ? HGBA1C 6.5 05/24/2021  ? ?Stable, pt to continue current medical treatment farxiga, metformin ? ?

## 2021-07-10 NOTE — Assessment & Plan Note (Signed)
Last vitamin D Lab Results  Component Value Date   VD25OH 36.16 05/24/2021   Low, to start oral replacement  

## 2021-07-10 NOTE — Progress Notes (Signed)
Patient ID: David [name], male   DOB: 1951/02/02, 71 y.o.   MRN: [id number]        Chief Complaint: follow up HTN, HLD and hyperglycemia , low vit d, bleeding varicose vein       HPI:  David [name] is a 71 y.o. male here after seen in ED after break and bleeding of superficial varicose vein to the right mid anteroir lower leg, s/p stitch in Ed 8 days ago now here for removal; doing well without fever, redness or increased swelling and wound appears to be intact and healing.  Pt denies chest pain, increased sob or doe, wheezing, orthopnea, PND, increased LE swelling, palpitations, dizziness or syncope.   Pt denies polydipsia, polyuria, or new focal neuro s/s.    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   Anticardiolipin antibody positive    Anxiety    ANXIETY 07/19/2008   CEREBROVASCULAR ACCIDENT, HX OF 07/19/2008   CVA (cerebral vascular accident) ([place]) 2003   Depression    DEPRESSION 07/19/2008   Diabetes mellitus    type II   DIABETES MELLITUS, TYPE II 01/18/2008   Diverticulosis    GERD 07/19/2008   GERD (gastroesophageal reflux disease)    Hemorrhoids    Hx of adenomatous colonic polyps    Hyperlipidemia    HYPERLIPIDEMIA 07/19/2008   Hypertension    HYPERTENSION 07/31/2009   Increased prostate specific antigen (PSA) velocity 09/13/2010   Internal hemorrhoids    Mitral regurgitation    Echo 9/21: EF 55-60, no RWMA, normal RVSF, mild-moderate LAE, mild-moderate MR, mild aortic stenosis (mean gradient  9.2 mmHg)   Perineal abscess    Peripheral vascular disease ([place])    peripheral vascular disease/carotid artery <100%   PERSONAL HX COLONIC POLYPS 12/10/2007   Unspecified Peripheral Vascular Disease 01/18/2008   Varicose veins    Past Surgical History:  Procedure Laterality Date   7 abdominal hernia repairs  05/31/10   ABDOMINAL AORTAGRAM N/A 01/12/2013   Procedure: ABDOMINAL Maxcine Ham;  Surgeon: Serafina Mitchell, MD;  Location: [facility] CATH LAB;  Service: Cardiovascular;  Laterality: N/A;   ABDOMINAL AORTIC ANEURYSM REPAIR     ABDOMINAL AORTOGRAM W/LOWER EXTREMITY Bilateral 10/05/2019   Procedure: ABDOMINAL AORTOGRAM W/LOWER EXTREMITY;  Surgeon: Serafina Mitchell, MD;  Location: [facility] CV LAB;  Service: Cardiovascular;  Laterality: Bilateral;   COLONOSCOPY     hernia surgury     71 years old   perineal abscess     PERIPHERAL VASCULAR CATHETERIZATION N/A 11/23/2014   Procedure: Abdominal Aortogram;  Surgeon: Serafina Mitchell, MD;  Location: [facility] CV LAB;  Service: Cardiovascular;  Laterality: N/A;   PERIPHERAL VASCULAR CATHETERIZATION  11/23/2014   Procedure: Lower Extremity Angiography;  Surgeon: Serafina Mitchell, MD;  Location: [facility] CV LAB;  Service: Cardiovascular;;   PERIPHERAL VASCULAR CATHETERIZATION  11/23/2014   Procedure: Peripheral Vascular Intervention;  Surgeon: Durene Fruits  Pierre Bali, MD;  Location: [facility] CV LAB;  Service: Cardiovascular;;  PTA bilateral common iliac PTA/DCB left fem-pop   POLYPECTOMY     s/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 His smoking use included cigarettes. He has never used smokeless tobacco. He reports current alcohol use of about 2.0 standard drinks per week. He reports that he does not use drugs. family history includes Aortic aneurysm in his mother; Cancer in his brother. Allergies  Allergen Reactions   Latex    Latex Rash   Current Outpatient  Medications on File Prior to Visit  Medication Sig Dispense Refill   amLODipine (NORVASC) 5 MG tablet TAKE (2) TABLETS BY MOUTH DAILY. (Patient taking differently: Take 10 mg by mouth daily.) 60 tablet 0   apixaban (ELIQUIS) 5 MG TABS tablet Take 1 tablet (5 mg total) by mouth 2 (two) times daily. 180 tablet 1   Ascorbic Acid (VITAMIN C) 1000 MG tablet Take 1,000 mg by mouth daily.     atorvastatin (LIPITOR) 40 MG tablet TAKE ONE TABLET BY MOUTH ONCE DAILY 90 tablet 1   cetirizine (ZYRTEC) 10 MG tablet Take 1 tablet (10 mg total) by mouth daily. 90 tablet 3   Cholecalciferol (VITAMIN D) 50 MCG (2000 UT) tablet Take 2,000 Units by mouth daily.     clopidogrel (PLAVIX) 75 MG tablet TAKE ONE TABLET BY MOUTH EVERY DAY 90 tablet 1   docusate sodium (COLACE) 100 MG capsule Take 100 mg by mouth 2 (two) times daily.     famotidine (PEPCID) 20 MG tablet Take 20 mg by mouth 2 (two) times daily.     FARXIGA 10 MG TABS tablet Take 10 mg by mouth daily.     folic acid (FOLVITE) 509 MCG tablet Take 800 mcg by mouth daily.     furosemide (LASIX) 40 MG tablet Take 40 mg by mouth daily.     gemfibrozil (LOPID) 600 MG tablet TAKE TWO TABLETS BY MOUTH ONCE DAILY 180 tablet 1   metFORMIN (GLUCOPHAGE-XR) 500 MG 24 hr tablet TAKE 4 TABLETS BY MOUTH EVERY MORNING 120 tablet 3   metroNIDAZOLE (METROGEL) 0.75 % gel Apply 1 application topically every other day.      Multiple Vitamin (MULTIVITAMIN) capsule Take 1 capsule by mouth daily.     olmesartan (BENICAR) 40 MG tablet Take 1 tablet (40 mg total) by mouth daily. Follow-up appt due in August must see provider for future refills 90 tablet 0   Omega-3 Fatty Acids (FISH OIL) 1000 MG CAPS Take 1,000 mg by mouth daily.     psyllium (FIBER LAXATIVE) 0.52 g capsule Take 3.36 g by mouth daily with lunch.     triamcinolone (NASACORT) 55 MCG/ACT AERO nasal inhaler Place 2 sprays into the nose daily. (Patient taking differently: Place 1 spray into the nose daily as needed  (allergies).) 1 Inhaler 12   No current facility-administered medications on file prior to visit.        ROS:  All others reviewed and negative.  Objective        PE:  BP 132/72    Pulse 61    Ht '5\' 10"'$  (1.778 m)    Wt 246 lb (111.6 kg)    SpO2 94%    BMI 35.30 kg/m                 Constitutional:  Pt appears in NAD               HENT: Head: NCAT.                Right Ear: External ear normal.                 Left Ear: External ear normal.                Eyes: . Pupils are equal, round, and reactive to light. Conjunctivae and EOM are normal               Nose: without d/c or deformity               Neck: Neck supple. Gross normal ROM               Cardiovascular: Normal rate and regular rhythm.                 Pulmonary/Chest: Effort normal and breath sounds without rales or wheezing.                               Neurological: Pt is alert. At baseline orientation, motor grossly intact               Skin: LE edema - trace pedal bilateral, right mid anterior leg stitch removed, wound intact without increased red, tender, swelling               Psychiatric: Pt behavior is normal without agitation   Micro: none  Cardiac tracings I have personally interpreted today:  none  Pertinent Radiological findings (summarize): none   Lab Results  Component Value Date   WBC 5.7 05/24/2021   HGB 13.0 05/24/2021   HCT 38.2 (L) 05/24/2021   PLT 224.0 05/24/2021   GLUCOSE 161 (H) 05/24/2021   CHOL 134 05/24/2021   TRIG 200.0 (H) 05/24/2021   HDL 40.80 05/24/2021   LDLDIRECT 56.0 12/22/2018   LDLCALC 53 05/24/2021   ALT 24 05/24/2021   AST 38 (H) 05/24/2021   NA 138 05/24/2021   K 4.3 05/24/2021   CL 99 05/24/2021   CREATININE 1.50 05/24/2021   BUN 30 (H) 05/24/2021   CO2 27 05/24/2021   TSH 2.07 05/24/2021   PSA 2.17 05/24/2021   INR 1.17 10/16/2017   HGBA1C 6.5 05/24/2021   MICROALBUR 173.4 (H) 11/12/2016   Assessment/Plan:  David [name] is a 71 y.o. 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[place]) (09/07/2008), Anticardiolipin antibody positive, Anxiety, ANXIETY (07/19/2008), CEREBROVASCULAR ACCIDENT, HX OF (07/19/2008), CVA (cerebral vascular accident) ([place]) (2003), Depression, DEPRESSION (07/19/2008), Diabetes mellitus, DIABETES MELLITUS, TYPE II (01/18/2008), Diverticulosis, GERD (07/19/2008), GERD (gastroesophageal reflux disease), Hemorrhoids, adenomatous colonic polyps, Hyperlipidemia, HYPERLIPIDEMIA (07/19/2008), Hypertension, HYPERTENSION (07/31/2009), Increased prostate specific antigen (PSA) velocity (09/13/2010), Internal hemorrhoids, Mitral regurgitation, Perineal abscess, Peripheral vascular disease ([place]), PERSONAL HX COLONIC POLYPS (12/10/2007), Unspecified Peripheral Vascular Disease (01/18/2008), and Varicose veins.  Vitamin D deficiency Last vitamin D Lab Results  Component Value Date   VD25OH 36.16 05/24/2021   Low, to start oral replacement   Essential hypertension BP Readings from Last 3 Encounters:  07/10/21 132/72  07/01/21 (!) 174/78  05/24/21 138/70   Stable, pt to continue medical treatment norvasc, benicar   Diabetes [facility]) Lab Results  Component Value  Date   HGBA1C 6.5 05/24/2021   Stable, pt to continue current medical treatment farxiga, metformin   Bleeding from varicose vein Resolved without further bleeding, stitch removed, no sign of infection,  to f/u any worsening symptoms or concerns  Followup: Return if symptoms worsen or fail to improve.  Cathlean Cower, MD 07/10/2021 8:17 PM [facility]

## 2021-07-10 NOTE — Assessment & Plan Note (Signed)
Resolved without further bleeding, stitch removed, no sign of infection,  to f/u any worsening symptoms or concerns ?

## 2021-07-13 ENCOUNTER — Other Ambulatory Visit: Payer: Self-pay | Admitting: *Deleted

## 2021-07-13 DIAGNOSIS — I6523 Occlusion and stenosis of bilateral carotid arteries: Secondary | ICD-10-CM

## 2021-07-13 DIAGNOSIS — I779 Disorder of arteries and arterioles, unspecified: Secondary | ICD-10-CM

## 2021-07-30 ENCOUNTER — Ambulatory Visit (INDEPENDENT_AMBULATORY_CARE_PROVIDER_SITE_OTHER)
Admission: RE | Admit: 2021-07-30 | Discharge: 2021-07-30 | Disposition: A | Discharge status: Home / Self Care | Payer: Medicare Other | Source: Ambulatory Visit | Attending: Surgery | Admitting: Surgery

## 2021-07-30 ENCOUNTER — Other Ambulatory Visit: Payer: Self-pay

## 2021-07-30 ENCOUNTER — Encounter: Payer: Self-pay | Admitting: Surgery

## 2021-07-30 ENCOUNTER — Ambulatory Visit (HOSPITAL_COMMUNITY)
Admission: RE | Admit: 2021-07-30 | Discharge: 2021-07-30 | Disposition: A | Discharge status: Home / Self Care | Payer: Medicare Other | Source: Ambulatory Visit | Attending: Surgery | Admitting: Surgery

## 2021-07-30 ENCOUNTER — Ambulatory Visit (INDEPENDENT_AMBULATORY_CARE_PROVIDER_SITE_OTHER): Payer: Medicare Other | Admitting: Surgery

## 2021-07-30 VITALS — BP 147/74 | HR 52 | Temp 98.1°F | Resp 20 | Ht 70.0 in | Wt 242.5 lb

## 2021-07-30 DIAGNOSIS — I7143 Infrarenal abdominal aortic aneurysm, without rupture: Secondary | ICD-10-CM | POA: Diagnosis not present

## 2021-07-30 DIAGNOSIS — I779 Disorder of arteries and arterioles, unspecified: Secondary | ICD-10-CM | POA: Insufficient documentation

## 2021-07-30 DIAGNOSIS — I6523 Occlusion and stenosis of bilateral carotid arteries: Secondary | ICD-10-CM

## 2021-07-30 DIAGNOSIS — I724 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: Secondary | ICD-10-CM

## 2021-07-30 NOTE — Progress Notes (Signed)
? ?[facility] [place] ? ?Patient name: AMAAN MEYER MRN: [id number] DOB: [birth year] Sex: male ? ? ?REASON FOR VISIT:  ? ? ?Follow up ? ?HISOTRY OF PRESENT ILLNESS:  ? ? ?EDVARDO HONSE is a 71 y.o. male who has undergone open abdominal aortic aneurysm repair by Dr. Amedeo Plenty in 2012. He's had viabahn stenting of his right popliteal aneurysm in 12/2008. He had his left popliteal aneurysm repaired by an above-knee to below-knee bypass in October of 2010.  He's had a midline abdominal hernia repair by Dr. Johney Maine. On 08-08-2019, the proximal anastamosis to his bypass graft was stented after a dissection following PTA.  On 01/12/2013 he underwent angiography with a successful balloon angioplasty of the stent using a 6 mm balloon.    On 11/23/2014 he underwent drug coated balloon angioplasty of the left proximal anastomotic stent.Marland Kitchen  He also underwent balloon angioplasty of a left common iliac stenosis and angioplasty of the right common iliac stenosis.  I performed angiography via a left brachial approach on 10-05-2019 and found progressive native disease in both legs, left worse than right.  We have elected to monitor these lesions.  He states that his legs feel good he is not having any claudication.  He does not have open wounds.  He did have to go to the emergency department for bleeding from a right leg varicosity which was treated with a stitch. ?  ?  ?He remains in atrial fibrillation which is treated with Eliquis.  He is also on Plavix.  He has no symptoms of claudication.  He has had no neurologic symptoms ?  ? ? ?PAST MEDICAL HISTORY:  ? ?Past Medical History:  ?Diagnosis Date  ? AAA (abdominal aortic aneurysm)   ? Adenomatous colon polyp 04/2005  ? 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 Anticardiolipin antibody positive   ? Anxiety   ? ANXIETY 07/19/2008  ? CEREBROVASCULAR ACCIDENT, HX OF 07/19/2008  ? CVA (cerebral vascular accident) [facility]) 2003   ? Depression   ? DEPRESSION 07/19/2008  ? Diabetes mellitus   ? type II  ? DIABETES MELLITUS, TYPE II 01/18/2008  ? Diverticulosis   ? GERD 07/19/2008  ? GERD (gastroesophageal reflux disease)   ? Hemorrhoids   ? Hx of adenomatous colonic polyps   ? Hyperlipidemia   ? HYPERLIPIDEMIA 07/19/2008  ? Hypertension   ? HYPERTENSION 07/31/2009  ? Increased prostate specific antigen (PSA) velocity 09/13/2010  ? Internal hemorrhoids   ? Mitral regurgitation   ? Echo 9/21: EF 55-60, no RWMA, normal RVSF, mild-moderate LAE, mild-moderate MR, mild aortic stenosis (mean gradient 9.2 mmHg)  ? Perineal abscess   ? Peripheral vascular disease ([place])   ? peripheral vascular disease/carotid artery <100%  ? PERSONAL HX COLONIC POLYPS 12/10/2007  ? Unspecified Peripheral Vascular Disease 01/18/2008  ? Varicose veins   ? ? ? ?FAMILY HISTORY:  ? ?Family History  ?Problem Relation Age of Onset  ? Aortic aneurysm Mother   ?     desceding aoritic aneurysm  ? Cancer Brother   ?     Lung cancer  ? Colon cancer Neg Hx   ? Esophageal cancer Neg Hx   ? Stomach cancer Neg Hx   ? Rectal cancer Neg Hx   ? ? ?SOCIAL HISTORY:  ? ?Social History  ? ?Tobacco Use  ? Smoking status: Former  ?  Types: Cigarettes  ?  Quit date: 01/14/1992  ?  Years since quitting: 29.5  ?  Smokeless tobacco: Never  ?Substance Use Topics  ? Alcohol use: Yes  ?  Alcohol/week: 2.0 standard drinks  ?  Types: 2 Glasses of wine per week  ?  Comment: wine daily  ? ? ? ?ALLERGIES:  ? ?Allergies  ?Allergen Reactions  ? Latex   ? Latex Rash  ? ? ? ?CURRENT MEDICATIONS:  ? ?Current Outpatient Medications  ?Medication Sig Dispense Refill  ? amLODipine (NORVASC) 5 MG tablet TAKE (2) TABLETS BY MOUTH DAILY. (Patient taking differently: Take 10 mg by mouth daily.) 60 tablet 0  ? apixaban (ELIQUIS) 5 MG TABS tablet Take 1 tablet (5 mg total) by mouth 2 (two) times daily. 180 tablet 1  ? Ascorbic Acid (VITAMIN C) 1000 MG tablet Take 1,000 mg by mouth daily.    ? atorvastatin (LIPITOR) 40 MG tablet  TAKE ONE TABLET BY MOUTH ONCE DAILY 90 tablet 1  ? cetirizine (ZYRTEC) 10 MG tablet Take 1 tablet (10 mg total) by mouth daily. 90 tablet 3  ? Cholecalciferol (VITAMIN D) 50 MCG (2000 UT) tablet Take 2,000 Units by mouth daily.    ? clopidogrel (PLAVIX) 75 MG tablet TAKE ONE TABLET BY MOUTH EVERY DAY 90 tablet 1  ? docusate sodium (COLACE) 100 MG capsule Take 100 mg by mouth 2 (two) times daily.    ? famotidine (PEPCID) 20 MG tablet Take 20 mg by mouth 2 (two) times daily.    ? FARXIGA 10 MG TABS tablet Take 10 mg by mouth daily.    ? folic acid (FOLVITE) 160 MCG tablet Take 800 mcg by mouth daily.    ? furosemide (LASIX) 40 MG tablet Take 40 mg by mouth daily.    ? gemfibrozil (LOPID) 600 MG tablet TAKE TWO TABLETS BY MOUTH ONCE DAILY 180 tablet 1  ? metFORMIN (GLUCOPHAGE-XR) 500 MG 24 hr tablet TAKE 4 TABLETS BY MOUTH EVERY MORNING 120 tablet 3  ? metroNIDAZOLE (METROGEL) 0.75 % gel Apply 1 application topically every other day.     ? Multiple Vitamin (MULTIVITAMIN) capsule Take 1 capsule by mouth daily.    ? olmesartan (BENICAR) 40 MG tablet Take 1 tablet (40 mg total) by mouth daily. Follow-up appt due in August must see provider for future refills 90 tablet 0  ? Omega-3 Fatty Acids (FISH OIL) 1000 MG CAPS Take 1,000 mg by mouth daily.    ? psyllium (FIBER LAXATIVE) 0.52 g capsule Take 3.36 g by mouth daily with lunch.    ? triamcinolone (NASACORT) 55 MCG/ACT AERO nasal inhaler Place 2 sprays into the nose daily. (Patient taking differently: Place 1 spray into the nose daily as needed (allergies).) 1 Inhaler 12  ? ?No current facility-administered medications for this visit.  ? ? ?REVIEW OF SYSTEMS:  ? ?'[X]'$  denotes positive finding, '[ ]'$  denotes negative finding ?Cardiac  Comments:  ?Chest pain or chest pressure:    ?Shortness of breath upon exertion:    ?Short of breath when lying flat:    ?Irregular heart rhythm:    ?    ?Vascular    ?Pain in calf, thigh, or hip brought on by ambulation:    ?Pain in feet at  night that wakes you up from your sleep:     ?Blood clot in your veins:    ?Leg swelling:  x   ?    ?Pulmonary    ?Oxygen at home:    ?Productive cough:     ?Wheezing:     ?    ?Neurologic    ?  Sudden weakness in arms or legs:     ?Sudden numbness in arms or legs:     ?Sudden onset of difficulty speaking or slurred speech:    ?Temporary loss of vision in one eye:     ?Problems with dizziness:     ?    ?Gastrointestinal    ?Blood in stool:     ?Vomited blood:     ?    ?Genitourinary    ?Burning when urinating:     ?Blood in urine:    ?    ?Psychiatric    ?Major depression:     ?    ?Hematologic    ?Bleeding problems:    ?Problems with blood clotting too easily:    ?    ?Skin    ?Rashes or ulcers:    ?    ?Constitutional    ?Fever or chills:    ? ? ?PHYSICAL EXAM:  ? ?Vitals:  ? 07/30/21 1055 07/30/21 1059  ?BP: 137/74 (!) 147/74  ?Pulse: (!) 52   ?Resp: 20   ?Temp: 98.1 ?F (36.7 ?C)   ?SpO2: 98%   ?Weight: 242 lb 8 oz (110 kg)   ?Height: '5\' 10"'$  (1.778 m)   ? ? ?GENERAL: The patient is a well-nourished male, in no acute distress. The vital signs are documented above. ?CARDIAC: There is a regular rate and rhythm.  ?PULMONARY: Non-labored respirations ?MUSCULOSKELETAL: There are no major deformities or cyanosis. ?NEUROLOGIC: No focal weakness or paresthesias are detected. ?SKIN: There are no ulcers or rashes noted. ?PSYCHIATRIC: The patient has a normal affect. ? ?STUDIES:  ? ?I have reviewed the following studies: ?Carotid: ?Right Carotid: Velocities in the right ICA are consistent with a 40-59%  ?               stenosis. Non-hemodynamically significant plaque <50% noted  ?in  ?               the CCA. The ECA appears >50% stenosed.  ? 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  ?The  ?              ECA appears >50% stenosed.  ? ?Vertebrals:  Right vertebral artery demonstrates high resistant flow. Left  ?             vertebral artery demonstrates no discernable flow.  ?Subclavians: Normal  flow hemodynamics were seen in bilateral subclavian  ?             arteries.  ? ?+-------+-----------+-----------+------------+------------+  ?ABI/TBIToday's ABIToday's TBIPrevious ABIPrevious TBI  ?+----

## 2021-08-30 ENCOUNTER — Other Ambulatory Visit: Payer: Self-pay | Admitting: *Deleted

## 2021-08-30 DIAGNOSIS — I4819 Other persistent atrial fibrillation: Secondary | ICD-10-CM

## 2021-08-30 MED ORDER — APIXABAN 5 MG PO TABS: 5.0000 mg | ORAL_TABLET | Freq: Two times a day (BID) | ORAL | 1 refills | Status: DC

## 2021-08-30 NOTE — Telephone Encounter (Signed)
Eliquis '5mg'$  paper refill request received. Patient is 71 years old, weight-110kg, Crea-1.50 on 05/24/2021, Diagnosis-Afib, and last seen by Ermalinda Barrios on 03/07/2021. Dose is appropriate based on dosing criteria. Will send in refill to requested pharmacy.   ?

## 2021-09-10 ENCOUNTER — Other Ambulatory Visit: Payer: Self-pay | Admitting: Internal Medicine

## 2021-09-10 NOTE — Telephone Encounter (Signed)
Please refill as per office routine med refill policy (all routine meds to be refilled for 3 mo or monthly (per pt preference) up to one year from last visit, then month to month grace period for 3 mo, then further med refills will have to be denied) ? ?

## 2021-09-19 ENCOUNTER — Other Ambulatory Visit: Payer: Self-pay | Admitting: Cardiovascular Disease

## 2021-09-19 DIAGNOSIS — I4819 Other persistent atrial fibrillation: Secondary | ICD-10-CM

## 2021-09-20 NOTE — Telephone Encounter (Signed)
Prescription refill request for Eliquis received. Indication:  Atrial Fib Last office visit: 03/07/21  Gerrianne Scale PA-C Scr: 1.5 on 05/24/21 Age:  71 Weight: 111.9kg  Based on above findings Eliquis '5mg'$  twice daily is the appropriate dose.  Refill approved.

## 2021-10-08 ENCOUNTER — Other Ambulatory Visit: Payer: Self-pay | Admitting: Internal Medicine

## 2021-11-14 ENCOUNTER — Encounter: Payer: Self-pay | Admitting: Gastroenterology

## 2021-11-21 ENCOUNTER — Ambulatory Visit (INDEPENDENT_AMBULATORY_CARE_PROVIDER_SITE_OTHER): Payer: Medicare Other | Admitting: Internal Medicine

## 2021-11-21 ENCOUNTER — Encounter: Payer: Self-pay | Admitting: Internal Medicine

## 2021-11-21 VITALS — BP 142/70 | HR 71 | Temp 98.3°F | Ht 70.0 in | Wt 243.0 lb

## 2021-11-21 DIAGNOSIS — I10 Essential (primary) hypertension: Secondary | ICD-10-CM | POA: Diagnosis not present

## 2021-11-21 DIAGNOSIS — N183 Chronic kidney disease, stage 3 unspecified: Secondary | ICD-10-CM

## 2021-11-21 DIAGNOSIS — R972 Elevated prostate specific antigen [PSA]: Secondary | ICD-10-CM | POA: Diagnosis not present

## 2021-11-21 DIAGNOSIS — E538 Deficiency of other specified B group vitamins: Secondary | ICD-10-CM | POA: Diagnosis not present

## 2021-11-21 DIAGNOSIS — H9191 Unspecified hearing loss, right ear: Secondary | ICD-10-CM | POA: Diagnosis not present

## 2021-11-21 DIAGNOSIS — H6121 Impacted cerumen, right ear: Secondary | ICD-10-CM

## 2021-11-21 DIAGNOSIS — E559 Vitamin D deficiency, unspecified: Secondary | ICD-10-CM

## 2021-11-21 DIAGNOSIS — I779 Disorder of arteries and arterioles, unspecified: Secondary | ICD-10-CM

## 2021-11-21 DIAGNOSIS — E7800 Pure hypercholesterolemia, unspecified: Secondary | ICD-10-CM

## 2021-11-21 DIAGNOSIS — E1121 Type 2 diabetes mellitus with diabetic nephropathy: Secondary | ICD-10-CM

## 2021-11-21 LAB — BASIC METABOLIC PANEL
BUN: 36 mg/dL — ABNORMAL HIGH (ref 6–23)
CO2: 26 mEq/L (ref 19–32)
Calcium: 10.6 mg/dL — ABNORMAL HIGH (ref 8.4–10.5)
Chloride: 99 mEq/L (ref 96–112)
Creatinine, Ser: 1.53 mg/dL — ABNORMAL HIGH (ref 0.40–1.50)
GFR: 44.94 mL/min — ABNORMAL LOW (ref >60.00)
Glucose, Bld: 145 mg/dL — ABNORMAL HIGH (ref 70–99)
Potassium: 4.5 mEq/L (ref 3.5–5.1)
Sodium: 136 mEq/L (ref 135–145)

## 2021-11-21 LAB — HEPATIC FUNCTION PANEL
ALT: 27 U/L (ref 0–53)
AST: 39 U/L — ABNORMAL HIGH (ref 0–37)
Albumin: 4.9 g/dL (ref 3.5–5.2)
Alkaline Phosphatase: 38 U/L — ABNORMAL LOW (ref 39–117)
Bilirubin, Direct: 0.1 mg/dL (ref 0.0–0.3)
Total Bilirubin: 0.6 mg/dL (ref 0.2–1.2)
Total Protein: 8.6 g/dL — ABNORMAL HIGH (ref 6.0–8.3)

## 2021-11-21 LAB — LIPID PANEL
Cholesterol: 149 mg/dL (ref 0–200)
HDL: 36.1 mg/dL — ABNORMAL LOW (ref >39.00)
NonHDL: 112.69
Total CHOL/HDL Ratio: 4
Triglycerides: 228 mg/dL — ABNORMAL HIGH (ref 0.0–149.0)
VLDL: 45.6 mg/dL — ABNORMAL HIGH (ref 0.0–40.0)

## 2021-11-21 LAB — LDL CHOLESTEROL, DIRECT: Direct LDL: 66 mg/dL

## 2021-11-21 LAB — HEMOGLOBIN A1C: Hgb A1c MFr Bld: 6.3 % (ref 4.6–6.5)

## 2021-11-21 LAB — VITAMIN D 25 HYDROXY (VIT D DEFICIENCY, FRACTURES): VITD: 36.71 ng/mL (ref 30.00–100.00)

## 2021-11-21 MED ORDER — AMLODIPINE BESYLATE 10 MG PO TABS: 10.0000 mg | ORAL_TABLET | Freq: Every day | ORAL | 3 refills | Status: DC

## 2021-11-21 NOTE — Progress Notes (Signed)
PRE-PROCEDURE EXAM: Right TM cannot be visualized due to total occlusion/impaction of the ear canal.  PROCEDURE INDICATION: remove wax to visualize ear drum & relieve discomfort  CONSENT:  Verbal     PROCEDURE NOTE:     RIGHT EAR:  I used a plastic wax curette under direct vision with an otoscope to free the wax bolus from the ear wall and then successfully removed a small bit of wax. The ear was then irrigated with warm water to remove the remaining wax.  POST- PROCEDURE EXAM: TMs successfully visualized and found to have no erythema     The patient tolerated the procedure well.

## 2021-11-21 NOTE — Assessment & Plan Note (Signed)
Lab Results  Component Value Date   PSA 2.17 05/24/2021   PSA 2.38 07/21/2020   PSA 1.8 01/13/2020   Stable, cont to follow, asymptomatic

## 2021-11-21 NOTE — Assessment & Plan Note (Signed)
Last vitamin D Lab Results  Component Value Date   VD25OH 36.16 05/24/2021   Low, to start oral replacement

## 2021-11-21 NOTE — Progress Notes (Signed)
Patient ID: David [name], male   DOB: 04-03-1951, 71 y.o.   MRN: [id number]        Chief Complaint: follow up right hearing worsening, low Vit D, ckd, htn       HPI:  DELAINE CANTER is a 71 y.o. male here with c/o reduced hearing for 1 wk without fever, HA, ear pain.  Pt denies chest pain, increased sob or doe, wheezing, orthopnea, PND, increased LE swelling, palpitations, dizziness or syncope.   Pt denies polydipsia, polyuria, or new focal neuro s/s.    Pt denies fever, wt loss, night sweats, loss of appetite, or other constitutional symptoms  Denies urinary symptoms such as dysuria, frequency, urgency, flank pain, hematuria or n/v, fever, chills.  Not taking Vit D.  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   Anticardiolipin antibody positive    Anxiety    ANXIETY 07/19/2008   CEREBROVASCULAR ACCIDENT, HX OF 07/19/2008   CVA (cerebral vascular accident) ([place]) 2003   Depression    DEPRESSION 07/19/2008   Diabetes mellitus    type II   DIABETES MELLITUS, TYPE II 01/18/2008   Diverticulosis    GERD 07/19/2008   GERD (gastroesophageal reflux disease)    Hemorrhoids    Hx of adenomatous colonic polyps    Hyperlipidemia    HYPERLIPIDEMIA 07/19/2008   Hypertension    HYPERTENSION 07/31/2009   Increased prostate specific antigen (PSA) velocity 09/13/2010   Internal hemorrhoids    Mitral regurgitation    Echo 9/21: EF 55-60, no RWMA, normal RVSF, mild-moderate LAE, mild-moderate MR, mild aortic stenosis (mean gradient 9.2 mmHg)   Perineal abscess    Peripheral vascular disease ([place])    peripheral vascular  disease/carotid artery <100%   PERSONAL HX COLONIC POLYPS 12/10/2007   Unspecified Peripheral Vascular Disease 01/18/2008   Varicose veins    Past Surgical History:  Procedure Laterality Date   7 abdominal hernia repairs  05/31/10   ABDOMINAL AORTAGRAM N/A 01/12/2013   Procedure: ABDOMINAL Maxcine Ham;  Surgeon: Serafina Mitchell, MD;  Location: [facility] CATH LAB;  Service: Cardiovascular;  Laterality: N/A;   ABDOMINAL AORTIC ANEURYSM REPAIR     ABDOMINAL AORTOGRAM W/LOWER EXTREMITY Bilateral 10/05/2019   Procedure: ABDOMINAL AORTOGRAM W/LOWER EXTREMITY;  Surgeon: Serafina Mitchell, MD;  Location: [facility] CV LAB;  Service: Cardiovascular;  Laterality: Bilateral;   COLONOSCOPY     hernia surgury     71 years old   perineal abscess     PERIPHERAL VASCULAR CATHETERIZATION N/A 11/23/2014   Procedure: Abdominal Aortogram;  Surgeon: Serafina Mitchell, MD;  Location: [facility] CV LAB;  Service: Cardiovascular;  Laterality: N/A;   PERIPHERAL VASCULAR CATHETERIZATION  11/23/2014   Procedure: Lower Extremity Angiography;  Surgeon: Serafina Mitchell, MD;  Location: [facility] CV LAB;  Service: Cardiovascular;;   PERIPHERAL VASCULAR CATHETERIZATION  11/23/2014   Procedure: Peripheral Vascular Intervention;  Surgeon: Serafina Mitchell, MD;  Location: [facility] CV LAB;  Service: Cardiovascular;;  PTA bilateral common iliac PTA/DCB left fem-pop  POLYPECTOMY     s/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 His smoking use included cigarettes. He has never used smokeless tobacco. He reports current alcohol use of about 2.0 standard drinks of alcohol per week. He reports that he does not use drugs. family history includes Aortic aneurysm in his mother; Cancer in his brother. Allergies  Allergen Reactions   Latex    Latex Rash   Current Outpatient Medications on File Prior to Visit  Medication Sig Dispense Refill   apixaban  (ELIQUIS) 5 MG TABS tablet TAKE 1 TABLET TWICE A DAY. 60 tablet 5   Ascorbic Acid (VITAMIN C) 1000 MG tablet Take 1,000 mg by mouth daily.     atorvastatin (LIPITOR) 40 MG tablet TAKE ONE TABLET BY MOUTH ONCE DAILY 90 tablet 2   cetirizine (ZYRTEC) 10 MG tablet Take 1 tablet (10 mg total) by mouth daily. 90 tablet 3   Cholecalciferol (VITAMIN D) 50 MCG (2000 UT) tablet Take 2,000 Units by mouth daily.     clopidogrel (PLAVIX) 75 MG tablet TAKE ONE TABLET BY MOUTH EVERY DAY 90 tablet 2   docusate sodium (COLACE) 100 MG capsule Take 100 mg by mouth 2 (two) times daily.     famotidine (PEPCID) 20 MG tablet Take 20 mg by mouth 2 (two) times daily.     FARXIGA 10 MG TABS tablet Take 10 mg by mouth daily.     folic acid (FOLVITE) 665 MCG tablet Take 800 mcg by mouth daily.     furosemide (LASIX) 40 MG tablet Take 40 mg by mouth daily.     gemfibrozil (LOPID) 600 MG tablet TAKE TWO TABLETS BY MOUTH ONCE DAILY 180 tablet 2   metFORMIN (GLUCOPHAGE-XR) 500 MG 24 hr tablet TAKE 4 TABLETS BY MOUTH EVERY MORNING 90 tablet 1   metroNIDAZOLE (METROGEL) 0.75 % gel Apply 1 application topically every other day.      Multiple Vitamin (MULTIVITAMIN) capsule Take 1 capsule by mouth daily.     olmesartan (BENICAR) 40 MG tablet Take 1 tablet (40 mg total) by mouth daily. Follow-up appt due in August must see provider for future refills 90 tablet 0   Omega-3 Fatty Acids (FISH OIL) 1000 MG CAPS Take 1,000 mg by mouth daily.     psyllium (FIBER LAXATIVE) 0.52 g capsule Take 3.36 g by mouth daily with lunch.     triamcinolone (NASACORT) 55 MCG/ACT AERO nasal inhaler Place 2 sprays into the nose daily. (Patient taking differently: Place 1 spray into the nose daily as needed (allergies).) 1 Inhaler 12   No current facility-administered medications on file prior to visit.        ROS:  All others reviewed and negative.  Objective        PE:  BP (!) 142/70 (BP Location: Right Arm, Patient Position: Sitting, Cuff Size:  Large)   Pulse 71   Temp 98.3 F (36.8 C) (Oral)   Ht '5\' 10"'$  (1.778 m)   Wt 243 lb (110.2 kg)   SpO2 99%   BMI 34.87 kg/m                 Constitutional: Pt appears in NAD               HENT: Head: NCAT.                Right  Ear: External ear normal.                 Left Ear: External ear normal.                Eyes: . Pupils are equal, round, and reactive to light. Conjunctivae and EOM are normal               Nose: without d/c or deformity               Neck: Neck supple. Gross normal ROM               Cardiovascular: Normal rate and regular rhythm.                 Pulmonary/Chest: Effort normal and breath sounds without rales or wheezing.                Abd:  Soft, NT, ND, + BS, no organomegaly               Neurological: Pt is alert. At baseline orientation, motor grossly intact               Skin: Skin is warm. No rashes, no other new lesions, LE edema - none               Psychiatric: Pt behavior is normal without agitation   Micro: none  Cardiac tracings I have personally interpreted today:  none  Pertinent Radiological findings (summarize): none   Lab Results  Component Value Date   WBC 5.7 05/24/2021   HGB 13.0 05/24/2021   HCT 38.2 (L) 05/24/2021   PLT 224.0 05/24/2021   GLUCOSE 145 (H) 11/21/2021   CHOL 149 11/21/2021   TRIG 228.0 (H) 11/21/2021   HDL 36.10 (L) 11/21/2021   LDLDIRECT 66.0 11/21/2021   LDLCALC 53 05/24/2021   ALT 27 11/21/2021   AST 39 (H) 11/21/2021   NA 136 11/21/2021   K 4.5 11/21/2021   CL 99 11/21/2021   CREATININE 1.53 (H) 11/21/2021   BUN 36 (H) 11/21/2021   CO2 26 11/21/2021   TSH 2.07 05/24/2021   PSA 2.17 05/24/2021   INR 1.17 10/16/2017   HGBA1C 6.3 11/21/2021   MICROALBUR 173.4 (H) 11/12/2016   Assessment/Plan:  RUBY DILONE is a 71 y.o. 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[place])  (09/07/2008), Anticardiolipin antibody positive, Anxiety, ANXIETY (07/19/2008), CEREBROVASCULAR ACCIDENT, HX OF (07/19/2008), CVA (cerebral vascular accident) ([place]) (2003), Depression, DEPRESSION (07/19/2008), Diabetes mellitus, DIABETES MELLITUS, TYPE II (01/18/2008), Diverticulosis, GERD (07/19/2008), GERD (gastroesophageal reflux disease), Hemorrhoids, adenomatous colonic polyps, Hyperlipidemia, HYPERLIPIDEMIA (07/19/2008), Hypertension, HYPERTENSION (07/31/2009), Increased prostate specific antigen (PSA) velocity (09/13/2010), Internal hemorrhoids, Mitral regurgitation, Perineal abscess, Peripheral vascular disease ([place]), PERSONAL HX COLONIC POLYPS (12/10/2007), Unspecified Peripheral Vascular Disease (01/18/2008), and Varicose veins.  Vitamin D deficiency Last vitamin D Lab Results  Component Value Date   VD25OH 36.16 05/24/2021   Low, to start oral replacement   Increased prostate specific antigen (PSA) velocity Lab Results  Component Value Date   PSA 2.17 05/24/2021   PSA 2.38 07/21/2020   PSA 1.8 01/13/2020   Stable, cont to follow, asymptomatic  HLD (hyperlipidemia) Lab Results  Component Value Date   LDLCALC 53 05/24/2021   Stable, pt to continue current statin lipitor 40 qd   Essential hypertension BP Readings from Last 3 Encounters:  11/21/21 (!) 142/70  07/30/21 (!) 147/74  07/10/21 132/72   Uncontrolled, pt states BP controlled at home, pt to continue medical treatment  - amlodipine 10, benicar 40 qd  Diabetes ([place]) Lab Results  Component Value Date   HGBA1C 6.3 11/21/2021   Stable, pt to continue current medical treatment metformin ER 500 - 4 qam   CKD (chronic kidney disease) Lab Results  Component Value Date   CREATININE 1.53 (H) 11/21/2021   Stable overall, cont to avoid nephrotoxins   B12 deficiency Lab Results  Component Value Date   VITAMINB12 694 05/24/2021   Stable, cont oral replacement - b12 1000 mcg qd   Acute hearing loss, right Improved s/p  irrigation  Ceruminosis is noted.  Wax is removed by syringing and manual debridement. Instructions for home care to prevent wax buildup are given.  Followup: Return in about 6 months (around 05/24/2022).  Cathlean Cower, MD 11/24/2021 6:36 PM [facility]

## 2021-11-21 NOTE — Patient Instructions (Signed)
Your right ear was cleared of wax today  Please take OTC Vitamin D3 at 2000 units per day, indefinitely  Your amlodipine 5 mg at 2 tabs per day was changed to taking one tab of 10 mg per day  You may want to check to see how much the Ozempic would cost with your insurance, and we could consider changing the metformin to this for sugar AND weight loss  Please continue all other medications as before, and refills have been done if requested.  Please have the pharmacy call with any other refills you may need.  Please continue your efforts at being more active, low cholesterol diet, and weight control.  Please keep your appointments with your specialists as you may have planned  Please go to the LAB at the blood drawing area for the tests to be done  You will be contacted by phone if any changes need to be made immediately.  Otherwise, you will receive a letter about your results with an explanation, but please check with MyChart first.  Please remember to sign up for MyChart if you have not done so, as this will be important to you in the future with finding out test results, communicating by private email, and scheduling acute appointments online when needed.  Please make an Appointment to return in 6 months, or sooner if needed

## 2021-11-24 ENCOUNTER — Encounter: Payer: Self-pay | Admitting: Internal Medicine

## 2021-11-24 NOTE — Assessment & Plan Note (Signed)
Lab Results  Component Value Date   EQUHKISN01 415 05/24/2021   Stable, cont oral replacement - b12 1000 mcg qd

## 2021-11-24 NOTE — Assessment & Plan Note (Signed)
Lab Results  Component Value Date   CREATININE 1.53 (H) 11/21/2021   Stable overall, cont to avoid nephrotoxins

## 2021-11-24 NOTE — Assessment & Plan Note (Signed)
Improved s/p irrigation  Ceruminosis is noted.  Wax is removed by syringing and manual debridement. Instructions for home care to prevent wax buildup are given.

## 2021-11-24 NOTE — Assessment & Plan Note (Signed)
Lab Results  Component Value Date   HGBA1C 6.3 11/21/2021   Stable, pt to continue current medical treatment metformin ER 500 - 4 qam

## 2021-11-24 NOTE — Assessment & Plan Note (Signed)
Lab Results  Component Value Date   LDLCALC 53 05/24/2021   Stable, pt to continue current statin lipitor 40 qd

## 2021-11-24 NOTE — Assessment & Plan Note (Signed)
BP Readings from Last 3 Encounters:  11/21/21 (!) 142/70  07/30/21 (!) 147/74  07/10/21 132/72   Uncontrolled, pt states BP controlled at home, pt to continue medical treatment  - amlodipine 10, benicar 40 qd

## 2021-12-11 ENCOUNTER — Other Ambulatory Visit: Payer: Self-pay

## 2021-12-11 ENCOUNTER — Encounter (HOSPITAL_BASED_OUTPATIENT_CLINIC_OR_DEPARTMENT_OTHER): Payer: Self-pay

## 2021-12-11 ENCOUNTER — Encounter: Payer: Self-pay | Admitting: Surgery

## 2021-12-11 ENCOUNTER — Emergency Department (HOSPITAL_BASED_OUTPATIENT_CLINIC_OR_DEPARTMENT_OTHER)
Admission: EM | Admit: 2021-12-11 | Discharge: 2021-12-11 | Disposition: A | Discharge status: Home / Self Care | Payer: Medicare Other | Attending: Emergency Medicine | Admitting: Emergency Medicine

## 2021-12-11 DIAGNOSIS — I8391 Asymptomatic varicose veins of right lower extremity: Secondary | ICD-10-CM | POA: Insufficient documentation

## 2021-12-11 DIAGNOSIS — I4891 Unspecified atrial fibrillation: Secondary | ICD-10-CM | POA: Insufficient documentation

## 2021-12-11 DIAGNOSIS — Z91040 Latex allergy status: Secondary | ICD-10-CM | POA: Insufficient documentation

## 2021-12-11 DIAGNOSIS — I83891 Varicose veins of right lower extremities with other complications: Secondary | ICD-10-CM | POA: Diagnosis not present

## 2021-12-11 DIAGNOSIS — R58 Hemorrhage, not elsewhere classified: Secondary | ICD-10-CM | POA: Diagnosis present

## 2021-12-11 DIAGNOSIS — Z7902 Long term (current) use of antithrombotics/antiplatelets: Secondary | ICD-10-CM | POA: Diagnosis not present

## 2021-12-11 DIAGNOSIS — Z7901 Long term (current) use of anticoagulants: Secondary | ICD-10-CM | POA: Insufficient documentation

## 2021-12-11 DIAGNOSIS — I83899 Varicose veins of unspecified lower extremities with other complications: Secondary | ICD-10-CM

## 2021-12-11 NOTE — ED Triage Notes (Signed)
Pt. States he has a blood blister on leg and burst this am. Controlled bleeding. States is also on blood thinners. Denies pain or other issues.

## 2021-12-11 NOTE — ED Provider Notes (Signed)
[facility] EMERGENCY DEPT Provider Note   CSN: [id number] Arrival date & time: 12/11/21  1040     History  Chief Complaint  Patient presents with   Leg Injury    David [name] is a 71 y.o. male.  Patient presents to the emergency department for evaluation of bleeding from the right lower leg.  Patient has a history of varicose veins.  He has had bleeding in the past.  He is on Eliquis for atrial fibrillation and Plavix for peripheral arterial disease.  Patient states that while in the shower this morning, the blister broke open and he had brisk bleeding from the area.  Patient's wife applied a pressure bandage at home.  He then came to the emergency department for evaluation.  No lightheadedness, syncope.  Patient is also on Lasix.      Home Medications Prior to Admission medications   Medication Sig Start Date End Date Taking? Authorizing Provider  amLODipine (NORVASC) 10 MG tablet Take 1 tablet (10 mg total) by mouth daily. 11/21/21   Biagio Borg, MD  apixaban (ELIQUIS) 5 MG TABS tablet TAKE 1 TABLET TWICE A DAY. 09/20/21   [name], MD  Ascorbic Acid (VITAMIN C) 1000 MG tablet Take 1,000 mg by mouth daily.    [provider]  atorvastatin (LIPITOR) 40 MG tablet TAKE ONE TABLET BY MOUTH ONCE DAILY 10/08/21   Biagio Borg, MD  cetirizine (ZYRTEC) 10 MG tablet Take 1 tablet (10 mg total) by mouth daily. 08/18/14   Biagio Borg, MD  Cholecalciferol (VITAMIN D) 50 MCG (2000 UT) tablet Take 2,000 Units by mouth daily.    [provider]  clopidogrel (PLAVIX) 75 MG tablet TAKE ONE TABLET BY MOUTH EVERY DAY 10/08/21   Biagio Borg, MD  docusate sodium (COLACE) 100 MG capsule Take 100 mg by mouth 2 (two) times daily.    [provider]  famotidine (PEPCID) 20 MG tablet Take 20 mg by mouth 2 (two) times daily.    [provider]  FARXIGA 10 MG TABS tablet Take 10 mg by mouth daily. 05/17/20   [provider]  folic  acid (FOLVITE) 626 MCG tablet Take 800 mcg by mouth daily.    [provider]  furosemide (LASIX) 40 MG tablet Take 40 mg by mouth daily.    [provider]  gemfibrozil (LOPID) 600 MG tablet TAKE TWO TABLETS BY MOUTH ONCE DAILY 10/08/21   Biagio Borg, MD  metFORMIN (GLUCOPHAGE-XR) 500 MG 24 hr tablet TAKE 4 TABLETS BY MOUTH EVERY MORNING 09/11/21   Biagio Borg, MD  metroNIDAZOLE (METROGEL) 0.75 % gel Apply 1 application topically every other day.  10/09/14   [provider]  Multiple Vitamin (MULTIVITAMIN) capsule Take 1 capsule by mouth daily.    [provider]  olmesartan (BENICAR) 40 MG tablet Take 1 tablet (40 mg total) by mouth daily. Follow-up appt due in August must see provider for future refills 10/05/18   Biagio Borg, MD  Omega-3 Fatty Acids (FISH OIL) 1000 MG CAPS Take 1,000 mg by mouth daily.    [provider]  psyllium (FIBER LAXATIVE) 0.52 g capsule Take 3.36 g by mouth daily with lunch.    [provider]  triamcinolone (NASACORT) 55 MCG/ACT AERO nasal inhaler Place 2 sprays into the nose daily. Patient taking differently: Place 1 spray into the nose daily as needed (allergies). 06/18/17   Biagio Borg, MD  Allergies    Latex and Latex    Review of Systems   Review of Systems  Physical Exam Updated Vital Signs BP (!) 173/72 (BP Location: Right Arm)   Pulse 69   Temp 97.7 F (36.5 C) (Oral)   Resp 20   Ht '5\' 10"'$  (1.778 m)   Wt 113.4 kg   SpO2 100%   BMI 35.87 kg/m   Physical Exam Vitals and nursing note reviewed.  Constitutional:      Appearance: He is well-developed.  HENT:     Head: Normocephalic and atraumatic.  Eyes:     Conjunctiva/sclera: Conjunctivae normal.  Pulmonary:     Effort: No respiratory distress.  Musculoskeletal:     Cervical back: Normal range of motion and neck supple.     Comments: Patient with pressure bandage to the right lower extremity, anteriorly, with some blood noted on the  bandage as it was gradually removed.  Once dressing removed, brisk venous bleeding resumed. Easily controlled with pressure.   Skin:    General: Skin is warm and dry.  Neurological:     Mental Status: He is alert.    ED Results / Procedures / Treatments   Labs (all labs ordered are listed, but only abnormal results are displayed) Labs Reviewed - No data to display  EKG None  Radiology No results found.  Procedures Procedures    Medications Ordered in ED Medications - No data to display  ED Course/ Medical Decision Making/ A&P    Patient seen and examined.  Pressure bandage removed.  Brisk venous bleeding resumed, controlled with direct pressure.  RN assisted in placing a pressure dressing.  I attempted to apply quick clot over the area of bleeding, however this was unsuccessful due to the rate of bleeding.  Bandage was created using nonadherent gauze directly over the wound with some additional gauze over this.  I taped a 4 x 4 into a roll to place directly over the wound and then this was covered with tape and Coban with some pressure.  Distal sensation and circulation remained intact.  Bleeding appears to be controlled.  Will monitor.  Patient discussed with Dr. Langston Masker. Will have patient hold anticoagulant and antiplatelet for at least 48 hours.  Labs/EKG: None ordered  Imaging: None ordered  Medications/Fluids: None ordered  Most recent vital signs reviewed and are as follows: BP (!) 173/72 (BP Location: Right Arm)   Pulse 69   Temp 97.7 F (36.5 C) (Oral)   Resp 20   Ht '5\' 10"'$  (1.778 m)   Wt 113.4 kg   SpO2 100%   BMI 35.87 kg/m   Initial impression: Ruptured varicosity on right lower extremity, complicated by use of antiplatelet and anticoagulation.  Low concern for clinically significant blood loss at this time.  Bleeding controlled.  We will continue to monitor.  1:21 PM Reassessment performed. Patient appears stable.  There does not appear to be any  bleedthrough on pressure bandage which remains in place.  Reviewed pertinent lab work and imaging with patient at bedside. Questions answered.   Most current vital signs reviewed and are as follows: BP (!) 173/72 (BP Location: Right Arm)   Pulse 69   Temp 97.7 F (36.5 C) (Oral)   Resp 20   Ht '5\' 10"'$  (1.778 m)   Wt 113.4 kg   SpO2 100%   BMI 35.87 kg/m   Plan: Leave pressure dressing in place over the next 24 hours.  Hold Plavix and apixaban for  48 hours.  Follow-up with vascular surgery.  Plan for discharge at this time.  Patient is comfortable.  He will keep extremity elevated today when able.                            Medical Decision Making  Patient with ruptured varicosity, right lower extremity, on antiplatelet and anticoagulation.  He is on this for peripheral arterial disease and atrial fibrillation.  Feel that the benefits of holding these medications over the next couple of days outweighs the risk of complications from his underlying conditions.  Bleeding controlled here with pressure.  The patient's vital signs, pertinent lab work and imaging were reviewed and interpreted as discussed in the ED course. Hospitalization was considered for further testing, treatments, or serial exams/observation. However as patient is well-appearing, has a stable exam, and reassuring studies today, I do not feel that they warrant admission at this time. This plan was discussed with the patient who verbalizes agreement and comfort with this plan and seems reliable and able to return to the Emergency Department with worsening or changing symptoms.          Final Clinical Impression(s) / ED Diagnoses Final diagnoses:  Ruptured varicose vein    Rx / DC Orders ED Discharge Orders     None         Carlisle Cater, PA-C 12/11/21 1323    Wyvonnia Dusky, MD 12/11/21 1740

## 2021-12-11 NOTE — Discharge Instructions (Signed)
Please leave the pressure dressing in place for the next 24 hours.  After that time rebandage with a bulky dressing to protect the area for a couple of more days.  Please do not take apixaban or clopidogrel, your blood thinning medications, over the next 2 days.  If you have significant rebleeding, not controlled with pressure at home, please return to the emergency department.  I think it would be a good idea for you to contact your vascular surgeon for further advice in the future regarding this bleeding.

## 2021-12-13 ENCOUNTER — Telehealth: Payer: Self-pay

## 2021-12-13 DIAGNOSIS — I83899 Varicose veins of unspecified lower extremities with other complications: Secondary | ICD-10-CM

## 2021-12-13 DIAGNOSIS — I739 Peripheral vascular disease, unspecified: Secondary | ICD-10-CM

## 2021-12-13 NOTE — Telephone Encounter (Signed)
Pt sent Mychart msg regarding varicose vein bleeding episode and subsequent ED visit. Pt requesting advice.  Discussed case with Davy Pique RN who recommended that pt be seen by Dr Trula Slade since he's his pt and have venous reflux [place]. Appts scheduled for pt, Mychart msg reply with information. Pt sent msg confirming appts. All questions answered.

## 2021-12-20 ENCOUNTER — Ambulatory Visit (INDEPENDENT_AMBULATORY_CARE_PROVIDER_SITE_OTHER): Payer: Medicare Other | Admitting: Surgery

## 2021-12-20 ENCOUNTER — Ambulatory Visit (HOSPITAL_COMMUNITY)
Admission: RE | Admit: 2021-12-20 | Discharge: 2021-12-20 | Disposition: A | Discharge status: Home / Self Care | Payer: Medicare Other | Source: Ambulatory Visit | Attending: Vascular Surgery | Admitting: Vascular Surgery

## 2021-12-20 ENCOUNTER — Encounter: Payer: Self-pay | Admitting: Surgery

## 2021-12-20 VITALS — BP 151/68 | HR 56 | Temp 98.0°F | Resp 16 | Ht 70.0 in | Wt 238.0 lb

## 2021-12-20 DIAGNOSIS — I83899 Varicose veins of unspecified lower extremities with other complications: Secondary | ICD-10-CM

## 2021-12-20 DIAGNOSIS — I779 Disorder of arteries and arterioles, unspecified: Secondary | ICD-10-CM | POA: Diagnosis not present

## 2021-12-20 DIAGNOSIS — I739 Peripheral vascular disease, unspecified: Secondary | ICD-10-CM | POA: Diagnosis not present

## 2021-12-20 DIAGNOSIS — I83893 Varicose veins of bilateral lower extremities with other complications: Secondary | ICD-10-CM

## 2021-12-20 NOTE — Progress Notes (Signed)
[facility]  Patient name: David [name] MRN: [id number] DOB: January 21, 1951 Sex: male   REASON FOR VISIT:    Follow up, new bleeding varicose vein  HISOTRY OF PRESENT ILLNESS:    David [name] is a 71 y.o. male who went to the emergency department on 12/11/2021 due to bleeding from a right leg varicose vein.  This occurred while in the shower when a blister broke.  He is on anticoagulation as well as antiplatelet therapy for his PAD and atrial fibrillation.  These were held.  Bleeding was controlled with manual pressure.  He had a previous bleed back in march at the same spot.  He underwent right saphenous vein strippling by Dr. Kellie Simmering in the remote past.   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 08/07/2010: Angioplasty left leg bypass with stenting secondary to dissection following angioplasty 01/12/2013: Balloon angioplasty left leg bypass 11/23/2014: Drug-coated balloon angioplasty, left superficial femoral artery and angioplasty of bilateral common iliac arteries PAST MEDICAL HISTORY:   Past Medical History:  Diagnosis Date   AAA (abdominal aortic aneurysm) ([place])    Adenomatous colon polyp 04/2005   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   Anticardiolipin antibody positive    Anxiety    ANXIETY 07/19/2008   CEREBROVASCULAR ACCIDENT, HX OF 07/19/2008   CVA (cerebral vascular accident) ([place]) 2003   Depression    DEPRESSION 07/19/2008   Diabetes mellitus    type II   DIABETES MELLITUS, TYPE II 01/18/2008   Diverticulosis    GERD 07/19/2008   GERD (gastroesophageal reflux disease)    Hemorrhoids    Hx of adenomatous colonic polyps    Hyperlipidemia    HYPERLIPIDEMIA 07/19/2008    Hypertension    HYPERTENSION 07/31/2009   Increased prostate specific antigen (PSA) velocity 09/13/2010   Internal hemorrhoids    Mitral regurgitation    Echo 9/21: EF 55-60, no RWMA, normal RVSF, mild-moderate LAE, mild-moderate MR, mild aortic stenosis (mean gradient 9.2 mmHg)   Perineal abscess    Peripheral vascular disease (HCC)    peripheral vascular disease/carotid artery <100%   PERSONAL HX COLONIC POLYPS 12/10/2007   Unspecified Peripheral Vascular Disease 01/18/2008   Varicose veins      FAMILY HISTORY:   Family History  Problem Relation Age of Onset   Aortic aneurysm Mother        desceding aoritic aneurysm   Cancer Brother        Lung cancer   Colon cancer Neg Hx    Esophageal cancer Neg Hx    Stomach cancer Neg Hx    Rectal cancer Neg Hx     SOCIAL HISTORY:   Social History   Tobacco Use   Smoking status: Former    Types: Cigarettes    Quit date: 01/14/1992    Years since quitting: 29.9   Smokeless tobacco: Never  Substance Use Topics   Alcohol use: Yes    Alcohol/week: 2.0 standard drinks of alcohol    Types: 2 Glasses of wine per week    Comment: wine daily     ALLERGIES:   Allergies  Allergen Reactions   Latex    Latex Rash     CURRENT MEDICATIONS:   Current Outpatient Medications  Medication Sig Dispense Refill   amLODipine (  NORVASC) 10 MG tablet Take 1 tablet (10 mg total) by mouth daily. 90 tablet 3   apixaban (ELIQUIS) 5 MG TABS tablet TAKE 1 TABLET TWICE A DAY. 60 tablet 5   Ascorbic Acid (VITAMIN C) 1000 MG tablet Take 1,000 mg by mouth daily.     atorvastatin (LIPITOR) 40 MG tablet TAKE ONE TABLET BY MOUTH ONCE DAILY 90 tablet 2   cetirizine (ZYRTEC) 10 MG tablet Take 1 tablet (10 mg total) by mouth daily. 90 tablet 3   Cholecalciferol (VITAMIN D) 50 MCG (2000 UT) tablet Take 2,000 Units by mouth daily.     clopidogrel (PLAVIX) 75 MG tablet TAKE ONE TABLET BY MOUTH EVERY DAY 90 tablet 2   docusate sodium (COLACE) 100 MG capsule Take  100 mg by mouth 2 (two) times daily.     famotidine (PEPCID) 20 MG tablet Take 20 mg by mouth 2 (two) times daily.     FARXIGA 10 MG TABS tablet Take 10 mg by mouth daily.     folic acid (FOLVITE) 782 MCG tablet Take 800 mcg by mouth daily.     furosemide (LASIX) 40 MG tablet Take 40 mg by mouth daily.     gemfibrozil (LOPID) 600 MG tablet TAKE TWO TABLETS BY MOUTH ONCE DAILY 180 tablet 2   metFORMIN (GLUCOPHAGE-XR) 500 MG 24 hr tablet TAKE 4 TABLETS BY MOUTH EVERY MORNING 90 tablet 1   metroNIDAZOLE (METROGEL) 0.75 % gel Apply 1 application topically every other day.      Multiple Vitamin (MULTIVITAMIN) capsule Take 1 capsule by mouth daily.     olmesartan (BENICAR) 40 MG tablet Take 1 tablet (40 mg total) by mouth daily. Follow-up appt due in August must see provider for future refills 90 tablet 0   Omega-3 Fatty Acids (FISH OIL) 1000 MG CAPS Take 1,000 mg by mouth daily.     psyllium (FIBER LAXATIVE) 0.52 g capsule Take 3.36 g by mouth daily with lunch.     triamcinolone (NASACORT) 55 MCG/ACT AERO nasal inhaler Place 2 sprays into the nose daily. (Patient taking differently: Place 1 spray into the nose daily as needed (allergies).) 1 Inhaler 12   No current facility-administered medications for this visit.    REVIEW OF SYSTEMS:   '[X]'$  denotes positive finding, '[ ]'$  denotes negative finding Cardiac  Comments:  Chest pain or chest pressure:    Shortness of breath upon exertion:    Short of breath when lying flat:    Irregular heart rhythm:        Vascular    Pain in calf, thigh, or hip brought on by ambulation:    Pain in feet at night that wakes you up from your sleep:     Blood clot in your veins:    Leg swelling:         Pulmonary    Oxygen at home:    Productive cough:     Wheezing:         Neurologic    Sudden weakness in arms or legs:     Sudden numbness in arms or legs:     Sudden onset of difficulty speaking or slurred speech:    Temporary loss of vision in one eye:      Problems with dizziness:         Gastrointestinal    Blood in stool:     Vomited blood:         Genitourinary    Burning when urinating:  Blood in urine:        Psychiatric    Major depression:         Hematologic    Bleeding problems:    Problems with blood clotting too easily:        Skin    Rashes or ulcers:        Constitutional    Fever or chills:      PHYSICAL EXAM:   Vitals:   12/20/21 1041  BP: (!) 151/68  Pulse: (!) 56  Resp: 16  Temp: 98 F (36.7 C)  TempSrc: Temporal  SpO2: 100%  Weight: 238 lb (108 kg)  Height: '5\' 10"'$  (1.778 m)    GENERAL: The patient is a well-nourished male, in no acute distress. The vital signs are documented above. CARDIAC: There is a regular rate and rhythm.  PULMONARY: Non-labored respirations ABDOMEN: Soft and non-tender with normal pitched bowel sounds.  MUSCULOSKELETAL: There are no major deformities or cyanosis. NEUROLOGIC: No focal weakness or paresthesias are detected. SKIN: see photos below PSYCHIATRIC: The patient has a normal affect.     STUDIES:   I have reviewed his refux study with the following findings:   MEDICAL ISSUES:   Bleeding varicose veins:  He does not have superficial veins amenable for ablation.  There are multiple varicosities in both legs.  He is very concerned about a re-bleed.  I saw him with Sonya today.  We discussed having Izora Gala evaluate him for sclerotherapy of the veins he is concerned about.  I told him that it would be difficult to treat all of his veins due to their size, but the more worrisome veins could potentially be addressed.  He may also require stabs of the larger veins.  I stopped his plavix today and switched him to ASA'81mg'$     Leia Alf, MD, FACS [facility] [phone] Pager [phone]

## 2021-12-24 ENCOUNTER — Telehealth: Payer: Self-pay

## 2021-12-24 ENCOUNTER — Telehealth: Payer: Self-pay | Admitting: Cardiovascular Disease

## 2021-12-24 NOTE — Telephone Encounter (Signed)
Covering preop today. Will route to pharm for input on anticoag then pt will need VV. Pt is on Plavix but does not appear this is managed by cardiology (was not included in request below, just noting on [facility]). Last OV 03/2021.

## 2021-12-24 NOTE — Telephone Encounter (Signed)
   Pre-operative Risk Assessment    Patient Name: David [name]  DOB: Oct 17, 1950 MRN: [id number]      Request for Surgical Clearance    Procedure:   Sclero Therapy  Date of Surgery:  Clearance 01-08-22                                  Surgeon:  Dr Trula Slade Surgeon's Group or Practice Name:   Phone number:  [phone] Fax number:  [phone]   Type of Clearance Requested:   - Pharmacy:  Hold Apixaban (Eliquis) can he hold for 2 days   Type of Anesthesia:  None    Additional requests/questions:    Lorin Glass   12/24/2021, 10:24 AM

## 2021-12-24 NOTE — Telephone Encounter (Signed)
   Name: David [name]  DOB: 1950-06-23  MRN: [id number]  Primary Cardiologist: Lauree Chandler, MD   Preoperative team, please contact this patient and set up a phone call appointment for further preoperative risk assessment. Please obtain consent and complete medication review. Thank you for your help.  I confirm that guidance regarding antiplatelet and oral anticoagulation therapy has been completed and, if necessary, noted below.   Charlie Pitter, PA-C 12/24/2021, 11:26 AM [place]

## 2021-12-24 NOTE — Telephone Encounter (Signed)
Patient with diagnosis of afib on Eliquis for anticoagulation.    Procedure: sclero therapy Date of procedure: 01/08/22  CHA2DS2-VASc Score = 6  This indicates a 9.7% annual risk of stroke. The patient's score is based upon: CHF History: 0 HTN History: 1 Diabetes History: 1 Stroke History: 2 Vascular Disease History: 1 Age Score: 1 Gender Score: 0   CrCl 71m/min using adjusted body weight due to obesity Platelet count 224K  Would see if 1 day Eliquis hold is acceptable given lower bleed risk of procedure and pt's elevated CV risk off of anticoagulation including prior stroke.  **This guidance is not considered finalized until pre-operative APP has relayed final recommendations.**

## 2021-12-24 NOTE — Telephone Encounter (Signed)
Spoke with patient who agreeable to do a tele visit on 8/28 at 9:40 am. Med rec and consent have been done.

## 2021-12-24 NOTE — Telephone Encounter (Signed)
  Patient Consent for Virtual Visit        David [name] has provided verbal consent on 12/24/2021 for a virtual visit (video or telephone).   CONSENT FOR VIRTUAL VISIT FOR:  David [name]  By participating in this virtual visit I agree to the following:  I hereby voluntarily request, consent and authorize [facility] and its employed or contracted physicians, physician assistants, nurse practitioners or other licensed health care professionals (the Practitioner), to provide me with telemedicine health care services (the "Services") as deemed necessary by the treating Practitioner. I acknowledge and consent to receive the Services by the Practitioner via telemedicine. I understand that the telemedicine visit will involve communicating with the Practitioner through live audiovisual communication technology and the disclosure of certain medical information by electronic transmission. I acknowledge that I have been given the opportunity to request an in-person assessment or other available alternative prior to the telemedicine visit and am voluntarily participating in the telemedicine visit.  I understand that I have the right to withhold or withdraw my consent to the use of telemedicine in the course of my care at any time, without affecting my right to future care or treatment, and that the Practitioner or I may terminate the telemedicine visit at any time. I understand that I have the right to inspect all information obtained and/or recorded in the course of the telemedicine visit and may receive copies of available information for a reasonable fee.  I understand that some of the potential risks of receiving the Services via telemedicine include:  Delay or interruption in medical evaluation due to technological equipment failure or disruption; Information transmitted may not be sufficient (e.g. poor resolution of images) to allow for appropriate medical decision making by the Practitioner;  and/or  In rare instances, security protocols could fail, causing a breach of personal health information.  Furthermore, I acknowledge that it is my responsibility to provide information about my medical history, conditions and care that is complete and accurate to the best of my ability. I acknowledge that Practitioner's advice, recommendations, and/or decision may be based on factors not within their control, such as incomplete or inaccurate data provided by me or distortions of diagnostic images or specimens that may result from electronic transmissions. I understand that the practice of medicine is not an exact science and that Practitioner makes no warranties or guarantees regarding treatment outcomes. I acknowledge that a copy of this consent can be made available to me via my patient portal ([facility]), or I can request a printed copy by calling the office of [facility].    I understand that my insurance will be billed for this visit.   I have read or had this consent read to me. I understand the contents of this consent, which adequately explains the benefits and risks of the Services being provided via telemedicine.  I have been provided ample opportunity to ask questions regarding this consent and the Services and have had my questions answered to my satisfaction. I give my informed consent for the services to be provided through the use of telemedicine in my medical care

## 2021-12-31 ENCOUNTER — Ambulatory Visit (INDEPENDENT_AMBULATORY_CARE_PROVIDER_SITE_OTHER): Payer: Medicare Other | Admitting: Physician Assistant

## 2021-12-31 ENCOUNTER — Encounter: Payer: Self-pay | Admitting: Physician Assistant

## 2021-12-31 DIAGNOSIS — Z01810 Encounter for preprocedural cardiovascular examination: Secondary | ICD-10-CM

## 2021-12-31 NOTE — Progress Notes (Signed)
Virtual Visit via Telephone Note   Because of David [name]'s co-morbid illnesses, he is at least at moderate risk for complications without adequate follow up.  This format is felt to be most appropriate for this patient at this time.  The patient did not have access to video technology/had technical difficulties with video requiring transitioning to audio format only (telephone).  All issues noted in this document were discussed and addressed.  No physical exam could be performed with this format.  Please refer to the patient's chart for his consent to telehealth for David [name].  Evaluation Performed:  Preoperative cardiovascular risk assessment _____________   Date:  12/31/2021   Patient ID:  David [name], DOB [birth year], MRN [id number] Patient Location:  Home Provider location:   Office  Primary Care Provider:  Biagio Borg, MD Primary Cardiologist:  Lauree Chandler, MD  Chief Complaint / Patient Profile   71 y.o. 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 Trula Slade on 01/08/2022.  Type of anesthesia is unknown.  Request is to hold apixaban for 2 days.  He presents today for telephonic preoperative cardiovascular risk assessment.  Past Medical History    Past Medical History:  Diagnosis Date   AAA (abdominal aortic aneurysm) ([place])    Adenomatous colon polyp 04/2005   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 ([place]) 09/07/2008   Anticardiolipin antibody positive     Anxiety    ANXIETY 07/19/2008   CEREBROVASCULAR ACCIDENT, HX OF 07/19/2008   CVA (cerebral vascular accident) ([place]) 2003   Depression    DEPRESSION 07/19/2008   Diabetes mellitus    type II   DIABETES MELLITUS, TYPE II 01/18/2008   Diverticulosis    GERD 07/19/2008   GERD (gastroesophageal reflux disease)    Hemorrhoids    Hx of adenomatous colonic polyps    Hyperlipidemia    HYPERLIPIDEMIA 07/19/2008   Hypertension    HYPERTENSION 07/31/2009   Increased prostate specific antigen (PSA) velocity 09/13/2010   Internal hemorrhoids    Mitral regurgitation    Echo 9/21: EF 55-60, no RWMA, normal RVSF, mild-moderate LAE, mild-moderate MR, mild aortic stenosis (mean gradient 9.2 mmHg)   Perineal abscess    Peripheral vascular disease ([place])    peripheral vascular disease/carotid artery <100%   PERSONAL HX COLONIC POLYPS 12/10/2007   Unspecified Peripheral Vascular Disease 01/18/2008   Varicose veins    Past Surgical History:  Procedure Laterality Date   7 abdominal hernia repairs  05/31/10   ABDOMINAL AORTAGRAM N/A 01/12/2013   Procedure: ABDOMINAL Maxcine Ham;  Surgeon: Serafina Mitchell, MD;  Location: [facility] CATH LAB;  Service: Cardiovascular;  Laterality: N/A;   ABDOMINAL AORTIC ANEURYSM REPAIR     ABDOMINAL AORTOGRAM W/LOWER EXTREMITY Bilateral 10/05/2019   Procedure: ABDOMINAL AORTOGRAM W/LOWER EXTREMITY;  Surgeon: Serafina Mitchell, MD;  Location: [place] CV LAB;  Service: Cardiovascular;  Laterality: Bilateral;   COLONOSCOPY     hernia surgury     71 years old   perineal abscess     PERIPHERAL VASCULAR CATHETERIZATION N/A 11/23/2014  Procedure: Abdominal Aortogram;  Surgeon: Serafina Mitchell, MD;  Location: [place] CV LAB;  Service: Cardiovascular;  Laterality: N/A;   PERIPHERAL VASCULAR CATHETERIZATION  11/23/2014   Procedure: Lower Extremity Angiography;  Surgeon: Serafina Mitchell, MD;  Location: [place] CV LAB;  Service: Cardiovascular;;   PERIPHERAL VASCULAR CATHETERIZATION  11/23/2014    Procedure: Peripheral Vascular Intervention;  Surgeon: Serafina Mitchell, MD;  Location: [place] CV LAB;  Service: Cardiovascular;;  PTA bilateral common iliac PTA/DCB left fem-pop   POLYPECTOMY     s/p right and left popliteal aneurysm repair     SP ENDO REPAIR INFRAREN AAA      Allergies  Allergies  Allergen Reactions   Latex    Latex Rash    History of Present Illness    David [name] is a 71 y.o. male who presents via audio/video conferencing for a telehealth visit today.  Pt was last seen in cardiology clinic on 03/07/2021 by Ermalinda Barrios, PA-C.  At that time David [name] was doing well.  The patient is now pending procedure as outlined above. Since his last visit, he has done well without chest pain, shortness of breath, syncope, orthopnea, PND or leg edema.  Of note, his vascular surgeon has changed him from clopidogrel to aspirin.   Home Medications    Prior to Admission medications   Medication Sig Start Date End Date Taking? Authorizing Provider  amLODipine (NORVASC) 10 MG tablet Take 1 tablet (10 mg total) by mouth daily. 11/21/21   Biagio Borg, MD  apixaban (ELIQUIS) 5 MG TABS tablet TAKE 1 TABLET TWICE A DAY. 09/20/21   [name], MD  Ascorbic Acid (VITAMIN C) 1000 MG tablet Take 1,000 mg by mouth daily.    [provider]  aspirin EC 81 MG tablet Take 81 mg by mouth daily. Swallow whole.    [provider]  atorvastatin (LIPITOR) 40 MG tablet TAKE ONE TABLET BY MOUTH ONCE DAILY 10/08/21   Biagio Borg, MD  cetirizine (ZYRTEC) 10 MG tablet Take 1 tablet (10 mg total) by mouth daily. 08/18/14   Biagio Borg, MD  Cholecalciferol (VITAMIN D) 50 MCG (2000 UT) tablet Take 2,000 Units by mouth daily.    [provider]  clopidogrel (PLAVIX) 75 MG tablet TAKE ONE TABLET BY MOUTH EVERY DAY Patient not taking: Reported on 12/24/2021 10/08/21   Biagio Borg, MD  docusate sodium (COLACE) 100 MG capsule Take 100 mg by mouth 2 (two)  times daily.    [provider]  famotidine (PEPCID) 20 MG tablet Take 20 mg by mouth 2 (two) times daily.    [provider]  FARXIGA 10 MG TABS tablet Take 10 mg by mouth daily. 05/17/20   [provider]  folic acid (FOLVITE) 630 MCG tablet Take 800 mcg by mouth daily.    [provider]  furosemide (LASIX) 40 MG tablet Take 40 mg by mouth daily.    [provider]  gemfibrozil (LOPID) 600 MG tablet TAKE TWO TABLETS BY MOUTH ONCE DAILY 10/08/21   Biagio Borg, MD  metFORMIN (GLUCOPHAGE-XR) 500 MG 24 hr tablet TAKE 4 TABLETS BY MOUTH EVERY MORNING 09/11/21   Biagio Borg, MD  metroNIDAZOLE (METROGEL) 0.75 % gel Apply 1 application topically every other day.  10/09/14   [provider]  Multiple Vitamin (MULTIVITAMIN) capsule Take 1 capsule by mouth daily.    [provider]  olmesartan (BENICAR) 40 MG tablet  Take 1 tablet (40 mg total) by mouth daily. Follow-up appt due in August must see provider for future refills 10/05/18   Biagio Borg, MD  Omega-3 Fatty Acids (FISH OIL) 1000 MG CAPS Take 1,000 mg by mouth daily.    [provider]  psyllium (FIBER LAXATIVE) 0.52 g capsule Take 3.36 g by mouth daily with lunch.    [provider]  triamcinolone (NASACORT) 55 MCG/ACT AERO nasal inhaler Place 2 sprays into the nose daily. Patient taking differently: Place 1 spray into the nose daily as needed (allergies). 06/18/17   Biagio Borg, MD    Physical Exam    Vital Signs:  David [name] does not have vital signs available for review today.  Given telephonic nature of communication, physical exam is limited. AAOx3. NAD. Normal affect.  Speech and respirations are unlabored.  Accessory Clinical Findings    None  Assessment & Plan    1.  Preoperative Cardiovascular Risk Assessment:    David [name] perioperative risk of a major cardiac event is 0.9% according to the Revised Cardiac Risk Index (RCRI).  Therefore, he  is at low risk for perioperative complications.   His functional capacity is fair at 4.31 METs according to the Duke Activity Status Index (DASI). Recommendations: According to ACC/AHA guidelines, no further cardiovascular testing needed.  The patient may proceed to surgery at acceptable risk.   Antiplatelet and/or Anticoagulation Recommendations: Case was reviewed with Dr. Angelena Form regarding how long to hold anticoagulation. Eliquis (Apixaban) can be held for 2 days prior to surgery.  Please resume post op when felt to be safe.      A copy of this note will be routed to requesting surgeon.  Time:   Today, I have spent 12 minutes with the patient with telehealth technology discussing medical history, symptoms, and management plan.     Richardson Dopp, PA-C 12/31/2021, 3:50 PM

## 2022-01-08 ENCOUNTER — Ambulatory Visit (INDEPENDENT_AMBULATORY_CARE_PROVIDER_SITE_OTHER): Payer: Medicare Other

## 2022-01-08 DIAGNOSIS — I83893 Varicose veins of bilateral lower extremities with other complications: Secondary | ICD-10-CM

## 2022-01-08 NOTE — Progress Notes (Signed)
Treated pt's RLE spider and small reticular veins that have bled twice in the past with Asclera 1% administered with a 27g butterfly.  Patient received a total of 2 mL. Pt tolerated well. He may need another treatment in the near future. Pt was given post treatment care instructions on both handout and verbal.   Photos: Yes.    Compression stockings applied: Yes.

## 2022-01-09 DIAGNOSIS — I8393 Asymptomatic varicose veins of bilateral lower extremities: Secondary | ICD-10-CM

## 2022-01-11 ENCOUNTER — Encounter: Payer: Self-pay | Admitting: Gastroenterology

## 2022-01-11 ENCOUNTER — Ambulatory Visit (INDEPENDENT_AMBULATORY_CARE_PROVIDER_SITE_OTHER): Payer: Medicare Other | Admitting: Gastroenterology

## 2022-01-11 ENCOUNTER — Telehealth: Payer: Self-pay

## 2022-01-11 VITALS — BP 142/62 | HR 80 | Ht 66.0 in | Wt 213.5 lb

## 2022-01-11 DIAGNOSIS — Z7901 Long term (current) use of anticoagulants: Secondary | ICD-10-CM | POA: Diagnosis not present

## 2022-01-11 DIAGNOSIS — Z86010 Personal history of colonic polyps: Secondary | ICD-10-CM | POA: Diagnosis not present

## 2022-01-11 MED ORDER — NA SULFATE-K SULFATE-MG SULF 17.5-3.13-1.6 GM/177ML PO SOLN: 1.0000 | Freq: Once | ORAL | 0 refills | Status: AC

## 2022-01-11 NOTE — Telephone Encounter (Signed)
[facility] Pre-operative Risk Assessment     Request for surgical clearance:     Endoscopy Procedure  What type of surgery is being performed?     colonoscopy  When is this surgery scheduled?     03/26/22  What type of clearance is required ?   Pharmacy  Are there any medications that need to be held prior to surgery and how long? Eliquis x 2 days  Practice name and name of physician performing surgery?      [facility]  What is your office phone and fax number?      Phone- [phone]  [phone]  Anesthesia type (None, local, MAC, general) ?       MAC

## 2022-01-11 NOTE — Progress Notes (Signed)
Assessment    Personal history of adenomatous colon polyps.  Afib on Eliquis  Recommendations   Schedule colonoscopy. The risks (including bleeding, perforation, infection, missed lesions, medication reactions and possible hospitalization or surgery if complications occur), benefits, and alternatives to colonoscopy with possible biopsy and possible polypectomy were discussed with the patient and they consent to proceed.   Hold Eliquis 2 days before procedure - will instruct when and how to resume after procedure. Low but real risk of cardiovascular event such as heart attack, stroke, embolism, thrombosis or ischemia/infarct of other organs off Eliquis explained and need to seek urgent help if this occurs. The patient consents to proceed. Will communicate by phone or EMR with patient's prescribing provider to confirm that holding Eliquis is reasonable in this case.  Continue ASA EC 81 mg daily through the procedure.    HPI   Chief complaint: Personal history of adenomatous colon polyps   Patient profile:  David [name] is a 71 y.o. male referred by Cathlean Cower, MD for personal history of adenomatous colon polyps in 2006.  Colonoscopy in 2016 did not have precancerous colon polyps.  He has no ongoing gastrointestinal complaints.  He is now maintained on Eliquis and ASA.  Plavix was discontinued.  We discussed current guidelines which indicate a 10-year colonoscopy interval.  If we were to proceed a 10 years he would be 74 at the time.  He feels more comfortable with proceeding with colonoscopy at this time. Denies weight loss, abdominal pain, constipation, diarrhea, change in stool caliber, melena, hematochezia, nausea, vomiting, dysphagia, reflux symptoms, chest pain.    Previous Labs / Imaging::    Latest Ref Rng & Units 05/24/2021    8:52 AM 12/01/2020    8:40 PM 07/21/2020    9:35 AM  CBC  WBC 4.0 - 10.5 K/uL 5.7  5.2  5.3   Hemoglobin 13.0 - 17.0 g/dL 13.0  13.2  12.9    Hematocrit 39.0 - 52.0 % 38.2  37.5  37.0   Platelets 150.0 - 400.0 K/uL 224.0  207  287.0     No results found for: "LIPASE"    Latest Ref Rng & Units 11/21/2021    9:25 AM 05/24/2021    8:52 AM 12/01/2020    8:40 PM  CMP  Glucose 70 - 99 mg/dL 145  161  148   BUN 6 - 23 mg/dL 36  30  35   Creatinine 0.40 - 1.50 mg/dL 1.53  1.50  1.50   Sodium 135 - 145 mEq/L 136  138  132   Potassium 3.5 - 5.1 mEq/L 4.5  4.3  3.4   Chloride 96 - 112 mEq/L 99  99  99   CO2 19 - 32 mEq/L '26  27  17   '$ Calcium 8.4 - 10.5 mg/dL 10.6  10.4  10.0   Total Protein 6.0 - 8.3 g/dL 8.6  8.2  8.0   Total Bilirubin 0.2 - 1.2 mg/dL 0.6  0.7  0.5   Alkaline Phos 39 - 117 U/L 38  37  33   AST 0 - 37 U/L 39  38  38   ALT 0 - 53 U/L '27  24  30      '$ Previous GI evaluation    Endoscopies:    Imaging:  VAS [place] LOWER EXTREMITY VENOUS REFLUX  Lower Venous Reflux Study  Patient Name:  KEVONTA PHARISS  Date of Exam:   12/20/2021 Medical Rec #: [id number]  Accession #:    [id number] Date of Birth: 28-Dec-1950        Patient Gender: M Patient Age:   44 years Exam Location:  [facility] Procedure:      VAS [place] LOWER EXTREMITY VENOUS REFLUX Referring Phys: Harold Barban  --------------------------------------------------------------------------------   Indications: Varicosities. Other Indications: Seen in the ED on 12/11/21 for bleeding varicosity mid calf.  Anticoagulation: Plavix. Performing Technologist: Ralene Cork RVT    Examination Guidelines: A complete evaluation includes B-mode imaging, spectral Doppler, color Doppler, and power Doppler as needed of all accessible portions of each vessel. Bilateral testing is considered an integral part of a complete examination. Limited examinations for reoccurring indications may be performed as noted. The reflux portion of the exam is performed with the patient in reverse Trendelenburg. Significant venous reflux is defined as >500 ms in  the superficial venous system, and >1 second in the deep venous system.    Venous Reflux Times +--------------+---------+------+-----------+------------+------------------+ RIGHT         Reflux NoRefluxReflux TimeDiameter cmsComments                                   Yes                                            +--------------+---------+------+-----------+------------+------------------+ CFV                     yes   >1 second                                +--------------+---------+------+-----------+------------+------------------+ FV prox       no                                                       +--------------+---------+------+-----------+------------+------------------+ FV mid        no                                                       +--------------+---------+------+-----------+------------+------------------+ FV dist       no                                                       +--------------+---------+------+-----------+------------+------------------+ Popliteal               yes   >1 second                                +--------------+---------+------+-----------+------------+------------------+ GSV at SFJ              yes    >500 ms     0.638                       +--------------+---------+------+-----------+------------+------------------+  GSV prox thigh          yes    >500 ms     0.314                       +--------------+---------+------+-----------+------------+------------------+ GSV mid thigh           yes    >500 ms     0.258    then out of fascia +--------------+---------+------+-----------+------------+------------------+ SSV Pop Fossa no                           0.276                       +--------------+---------+------+-----------+------------+------------------+ SSV prox calf no                           0.239                        +--------------+---------+------+-----------+------------+------------------+ SSV mid calf            yes    >500 ms     0.296                       +--------------+---------+------+-----------+------------+------------------+ VV at knee              yes    >500 ms                                 +--------------+---------+------+-----------+------------+------------------+        Summary: Right: - No evidence of deep vein thrombosis seen in the right lower extremity, from the common femoral through the popliteal veins. - No evidence of superficial venous thrombosis in the right lower extremity. - Deep vein reflux in the CFV and poplieal vein. - Superficial vein reflux in the mid calf SSV. - Superficial vein reflux in the SFJ and GSV to the mid thigh where it goes out of the fascia. Tortuous varicosities with reflux distally.    *See table(s) above for measurements and observations.  Electronically signed by Harold Barban MD on 12/20/2021 at 3:02:19 PM.      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[place]) 09/07/2008   Anticardiolipin antibody positive    Anxiety    ANXIETY 07/19/2008   CEREBROVASCULAR ACCIDENT, HX OF 07/19/2008   CVA (cerebral vascular accident) ([place]) 2003   Depression    DEPRESSION 07/19/2008   Diabetes mellitus    type II   DIABETES MELLITUS, TYPE II 01/18/2008   Diverticulosis    GERD 07/19/2008   GERD (gastroesophageal reflux disease)    Hemorrhoids    Hx of adenomatous colonic polyps    Hyperlipidemia    HYPERLIPIDEMIA 07/19/2008   Hypertension    HYPERTENSION 07/31/2009   Increased prostate specific antigen (PSA) velocity 09/13/2010   Internal hemorrhoids    Mitral regurgitation    Echo 9/21: EF 55-60, no RWMA, normal RVSF, mild-moderate LAE, mild-moderate MR, mild aortic stenosis (mean gradient 9.2 mmHg)    Perineal abscess    Peripheral vascular disease ([facility])    peripheral vascular disease/carotid artery <100%   PERSONAL HX COLONIC  POLYPS 12/10/2007   Unspecified Peripheral Vascular Disease 01/18/2008   Varicose veins    Past Surgical History:  Procedure Laterality Date   7 abdominal hernia repairs  05/31/10   ABDOMINAL AORTAGRAM N/A 01/12/2013   Procedure: ABDOMINAL Maxcine Ham;  Surgeon: Serafina Mitchell, MD;  [facility];  Service: Cardiovascular;  Laterality: N/A;   ABDOMINAL AORTIC ANEURYSM REPAIR     ABDOMINAL AORTOGRAM W/LOWER EXTREMITY Bilateral 10/05/2019   Procedure: ABDOMINAL AORTOGRAM W/LOWER EXTREMITY;  Surgeon: Serafina Mitchell, MD;  Location: [place] CV LAB;  Service: Cardiovascular;  Laterality: Bilateral;   COLONOSCOPY     hernia surgury     71 years old   perineal abscess     PERIPHERAL VASCULAR CATHETERIZATION N/A 11/23/2014   Procedure: Abdominal Aortogram;  Surgeon: Serafina Mitchell, MD;  Location: [place] CV LAB;  Service: Cardiovascular;  Laterality: N/A;   PERIPHERAL VASCULAR CATHETERIZATION  11/23/2014   Procedure: Lower Extremity Angiography;  Surgeon: Serafina Mitchell, MD;  Location: [place] CV LAB;  Service: Cardiovascular;;   PERIPHERAL VASCULAR CATHETERIZATION  11/23/2014   Procedure: Peripheral Vascular Intervention;  Surgeon: Serafina Mitchell, MD;  Location: [place] CV LAB;  Service: Cardiovascular;;  PTA bilateral common iliac PTA/DCB left fem-pop   POLYPECTOMY     s/p right and left popliteal aneurysm repair     SP ENDO REPAIR INFRAREN AAA     Family History  Problem Relation Age of Onset   Aortic aneurysm Mother        desceding aoritic aneurysm   Cancer Brother        Lung cancer   Colon cancer Neg Hx    Esophageal cancer Neg Hx    Stomach cancer Neg Hx    Rectal cancer Neg Hx    Social History   Tobacco Use   Smoking status: Former    Types: Cigarettes    Quit date: 01/14/1992    Years since quitting: 30.0   Smokeless tobacco:  Never  Vaping Use   Vaping Use: Never used  Substance Use Topics   Alcohol use: Yes    Alcohol/week: 2.0 standard drinks of alcohol    Types: 2 Glasses of wine per week    Comment: wine daily   Drug use: No   Current Outpatient Medications  Medication Sig Dispense Refill   amLODipine (NORVASC) 10 MG tablet Take 1 tablet (10 mg total) by mouth daily. 90 tablet 3   apixaban (ELIQUIS) 5 MG TABS tablet TAKE 1 TABLET TWICE A DAY. 60 tablet 5   Ascorbic Acid (VITAMIN C) 1000 MG tablet Take 1,000 mg by mouth daily.     aspirin EC 81 MG tablet Take 81 mg by mouth daily. Swallow whole.     atorvastatin (LIPITOR) 40 MG tablet TAKE ONE TABLET BY MOUTH ONCE DAILY 90 tablet 2   cetirizine (ZYRTEC) 10 MG tablet Take 1 tablet (10 mg total) by mouth daily. 90 tablet 3   Cholecalciferol (VITAMIN D) 50 MCG (2000 UT) tablet Take 2,000 Units by mouth daily.     docusate sodium (COLACE) 100 MG capsule Take 100 mg by mouth 2 (two) times daily.     famotidine (PEPCID) 20 MG tablet Take 20 mg by mouth 2 (two) times daily.     FARXIGA 10 MG TABS tablet Take 10 mg by mouth daily.     folic acid (FOLVITE) 382 MCG tablet Take 800 mcg by mouth daily.     furosemide (LASIX) 40 MG  tablet Take 40 mg by mouth daily.     gemfibrozil (LOPID) 600 MG tablet TAKE TWO TABLETS BY MOUTH ONCE DAILY 180 tablet 2   metFORMIN (GLUCOPHAGE-XR) 500 MG 24 hr tablet TAKE 4 TABLETS BY MOUTH EVERY MORNING 90 tablet 1   metroNIDAZOLE (METROGEL) 0.75 % gel Apply 1 application topically every other day.      Multiple Vitamin (MULTIVITAMIN) capsule Take 1 capsule by mouth daily.     olmesartan (BENICAR) 40 MG tablet Take 1 tablet (40 mg total) by mouth daily. Follow-up appt due in August must see provider for future refills 90 tablet 0   Omega-3 Fatty Acids (FISH OIL) 1000 MG CAPS Take 1,000 mg by mouth daily.     psyllium (FIBER LAXATIVE) 0.52 g capsule Take 3.36 g by mouth daily with lunch.     triamcinolone (NASACORT) 55 MCG/ACT AERO  nasal inhaler Place 2 sprays into the nose daily. (Patient taking differently: Place 1 spray into the nose daily as needed (allergies).) 1 Inhaler 12   No current facility-administered medications for this visit.   Allergies  Allergen Reactions   Latex    Latex Rash    Review of Systems: All other systems reviewed and negative except where noted in HPI.    Physical Exam    Wt Readings from Last 3 Encounters:  01/11/22 213 lb 8 oz (96.8 kg)  12/20/21 238 lb (108 kg)  12/11/21 250 lb (113.4 kg)    BP (!) 142/62 (BP Location: Left Arm, Patient Position: Sitting, Cuff Size: Normal)   Pulse 80   Ht '5\' 6"'$  (1.676 m) Comment: hieght measured without shoes  Wt 213 lb 8 oz (96.8 kg)   BMI 34.46 kg/m  Constitutional:  Generally well appearing male in no acute distress. Psychiatric: Pleasant. Normal mood and affect. Behavior is normal. HEENT: Pupils normal.  Conjunctivae are normal. No scleral icterus. Neck supple.  Cardiovascular: Normal rate, regular rhythm. No edema Pulmonary/chest: Effort normal and breath sounds normal. No wheezing, rales or rhonchi. Abdominal: Soft, nondistended, nontender. Bowel sounds active throughout. There are no masses palpable. No hepatomegaly. Rectal:  Neurological: Alert and oriented to person place and time. Skin: Skin is warm and dry. No rashes noted.  Lucio Edward, MD   cc:  Referring Provider Biagio Borg, MD

## 2022-01-11 NOTE — Telephone Encounter (Signed)
Patient with diagnosis of A fib on Eliquis for anticoagulation.    Procedure: colonoscopy Date of procedure: 03/26/22   CHA2DS2-VASc Score = 6  This indicates a 9.7% annual risk of stroke. The patient's score is based upon: CHF History: 0 HTN History: 1 Diabetes History: 1 Stroke History: 2 Vascular Disease History: 1 Age Score: 1 Gender Score: 0    CrCl 59 mLmin Platelet count 224K  Per office protocol, patient can hold Eliquis for 2 days prior to procedure.   **This guidance is not considered finalized until pre-operative APP has relayed final recommendations.**

## 2022-01-11 NOTE — Patient Instructions (Signed)
You have been scheduled for a colonoscopy. Please follow written instructions given to you at your visit today.  Please pick up your prep supplies at the pharmacy within the next 1-3 days. If you use inhalers (even only as needed), please bring them with you on the day of your procedure.  The [facility] providers would like to encourage you to use MYCHART to communicate with providers for non-urgent requests or questions.  Due to long hold times on the telephone, sending your provider a message by MYCHART may be a faster and more efficient way to get a response.  Please allow 48 business hours for a response.  Please remember that this is for non-urgent requests.   Due to recent changes in healthcare laws, you may see the results of your imaging and laboratory studies on MyChart before your provider has had a chance to review them.  We understand that in some cases there may be results that are confusing or concerning to you. Not all laboratory results come back in the same time frame and the provider may be waiting for multiple results in order to interpret others.  Please give us 48 hours in order for your provider to thoroughly review all the results before contacting the office for clarification of your results.   Thank you for choosing me and [facility].  Malcolm T. Stark, Jr., MD., FACG  

## 2022-01-14 NOTE — Telephone Encounter (Signed)
   Name: David [name]  DOB: 1950-06-19  MRN: [id number]   Primary Cardiologist: Lauree Chandler, MD  Chart reviewed as part of pre-operative protocol coverage.   Per office protocol, patient can hold Eliquis 2 days prior to the procedure.  Please restart when medically safe to do so.  Medical clearance was not requested.   I will route this recommendation to the requesting party via Epic fax function and remove from pre-op pool. Please call with questions.  Elgie Collard, PA-C 01/14/2022, 4:30 PM

## 2022-01-15 NOTE — Telephone Encounter (Signed)
Called patient with no answer and no voicemail to leave a message. 

## 2022-01-16 NOTE — Telephone Encounter (Signed)
Called alternate phone number and left a message for patient to return my call.

## 2022-01-16 NOTE — Telephone Encounter (Signed)
Informed patient to hold Eliquis 2 days prior to his procedure. Patient verbalized understanding. 

## 2022-01-21 ENCOUNTER — Other Ambulatory Visit: Payer: Self-pay | Admitting: Cardiovascular Disease

## 2022-01-21 DIAGNOSIS — I4819 Other persistent atrial fibrillation: Secondary | ICD-10-CM

## 2022-01-21 NOTE — Telephone Encounter (Signed)
Prescription refill request for Eliquis received. Indication: Afib  Last office visit: 12/31/21 Kathlen Mody)  Scr: 1.53 (11/21/21) Age: 71 Weight: 76.8kg  Appropriate dose and refill sent to requested pharmacy.

## 2022-02-07 DIAGNOSIS — R809 Proteinuria, unspecified: Secondary | ICD-10-CM | POA: Diagnosis not present

## 2022-02-07 DIAGNOSIS — Z23 Encounter for immunization: Secondary | ICD-10-CM | POA: Diagnosis not present

## 2022-02-07 DIAGNOSIS — N1831 Chronic kidney disease, stage 3a: Secondary | ICD-10-CM | POA: Diagnosis not present

## 2022-02-07 DIAGNOSIS — I129 Hypertensive chronic kidney disease with stage 1 through stage 4 chronic kidney disease, or unspecified chronic kidney disease: Secondary | ICD-10-CM | POA: Diagnosis not present

## 2022-02-07 DIAGNOSIS — R609 Edema, unspecified: Secondary | ICD-10-CM | POA: Diagnosis not present

## 2022-02-07 DIAGNOSIS — E1122 Type 2 diabetes mellitus with diabetic chronic kidney disease: Secondary | ICD-10-CM | POA: Diagnosis not present

## 2022-02-07 DIAGNOSIS — E669 Obesity, unspecified: Secondary | ICD-10-CM | POA: Diagnosis not present

## 2022-02-07 DIAGNOSIS — E559 Vitamin D deficiency, unspecified: Secondary | ICD-10-CM | POA: Diagnosis not present

## 2022-02-22 ENCOUNTER — Encounter: Payer: Self-pay | Admitting: Internal Medicine

## 2022-02-22 MED ORDER — METFORMIN HCL ER 500 MG PO TB24: ORAL_TABLET | ORAL | 1 refills | Status: DC

## 2022-03-01 ENCOUNTER — Telehealth: Payer: Self-pay | Admitting: Internal Medicine

## 2022-03-01 NOTE — Telephone Encounter (Signed)
Yes, ok sounds good about the 360.  thanks

## 2022-03-01 NOTE — Telephone Encounter (Signed)
Pharmacy tech Maudie Mercury called stating she needs clarification on a medication (Metformin) for this patient. She can be best reached at [phone].

## 2022-03-26 ENCOUNTER — Encounter: Payer: Self-pay | Admitting: Gastroenterology

## 2022-03-26 ENCOUNTER — Ambulatory Visit (AMBULATORY_SURGERY_CENTER): Payer: Medicare Other | Admitting: Gastroenterology

## 2022-03-26 VITALS — BP 147/67 | HR 60 | Temp 98.4°F | Resp 16 | Ht 66.0 in | Wt 213.0 lb

## 2022-03-26 DIAGNOSIS — D122 Benign neoplasm of ascending colon: Secondary | ICD-10-CM | POA: Diagnosis not present

## 2022-03-26 DIAGNOSIS — Z09 Encounter for follow-up examination after completed treatment for conditions other than malignant neoplasm: Secondary | ICD-10-CM

## 2022-03-26 DIAGNOSIS — D123 Benign neoplasm of transverse colon: Secondary | ICD-10-CM | POA: Diagnosis not present

## 2022-03-26 DIAGNOSIS — E119 Type 2 diabetes mellitus without complications: Secondary | ICD-10-CM | POA: Diagnosis not present

## 2022-03-26 DIAGNOSIS — Z86010 Personal history of colonic polyps: Secondary | ICD-10-CM | POA: Diagnosis not present

## 2022-03-26 DIAGNOSIS — I10 Essential (primary) hypertension: Secondary | ICD-10-CM | POA: Diagnosis not present

## 2022-03-26 MED ORDER — SODIUM CHLORIDE 0.9 % IV SOLN: 500.0000 mL | INTRAVENOUS | Status: DC

## 2022-03-26 NOTE — Op Note (Signed)
[place] Patient Name: David [name] Procedure Date: 03/26/2022 7:57 AM MRN: [id number] Endoscopist: Ladene Artist , MD, [id number] Age: 71 Referring MD:  Date of Birth: Dec 20, 1950 Gender: Male Account #: [id number] Procedure:                Colonoscopy Indications:              Surveillance: Personal history of adenomatous                            polyps on last colonoscopy > 5 years ago Medicines:                Monitored Anesthesia Care Procedure:                Pre-Anesthesia Assessment:                           - Prior to the procedure, a History and Physical                            was performed, and patient medications and                            allergies were reviewed. The patient's tolerance of                            previous anesthesia was also reviewed. The risks                            and benefits of the procedure and the sedation                            options and risks were discussed with the patient.                            All questions were answered, and informed consent                            was obtained. Prior Anticoagulants: The patient has                            taken Eliquis (apixaban), last dose was 2 days                            prior to procedure. ASA Grade Assessment: III - A                            patient with severe systemic disease. After                            reviewing the risks and benefits, the patient was                            deemed in satisfactory condition to undergo the  procedure.                           After obtaining informed consent, the colonoscope                            was passed under direct vision. Throughout the                            procedure, the patient's blood pressure, pulse, and                            oxygen saturations were monitored continuously. The                            Olympus CF-HQ190L ([id number]) Colonoscope was                             introduced through the anus and advanced to the the                            cecum, identified by appendiceal orifice and                            ileocecal valve. The ileocecal valve, appendiceal                            orifice, and rectum were photographed. The quality                            of the bowel preparation was adequate after                            extensive lavage, suction. The colonoscopy was                            performed without difficulty. The patient tolerated                            the procedure well. Scope In: 8:02:47 AM Scope Out: 8:20:27 AM Scope Withdrawal Time: 0 hours 13 minutes 28 seconds  Total Procedure Duration: 0 hours 17 minutes 40 seconds  Findings:                 The perianal and digital rectal examinations were                            normal.                           Four sessile polyps were found in the transverse                            colon (3) and ascending colon (1). The polyps were  6 to 7 mm in size. These polyps were removed with a                            cold snare. Resection and retrieval were complete.                           Multiple medium-mouthed diverticula were found in                            the entire colon, more concentrated in the left                            colon. There was no evidence of diverticular                            bleeding.                           Internal hemorrhoids were found during                            retroflexion. The hemorrhoids were medium-sized and                            Grade II (internal hemorrhoids that prolapse but                            reduce spontaneously).                           The exam was otherwise without abnormality on                            direct and retroflexion views. Complications:            No immediate complications. Estimated blood loss:                             None. Estimated Blood Loss:     Estimated blood loss: none. Impression:               - Four 6 to 7 mm polyps in the transverse colon and                            in the ascending colon, removed with a cold snare.                            Resected and retrieved.                           - Moderate diverticulosis in the entire examined                            colon, more concentrated in the left colon.                           -  Internal hemorrhoids.                           - The examination was otherwise normal on direct                            and retroflexion views. Recommendation:           - Repeat colonoscopy vs no repeat due to age after                            studies are complete for surveillance based on                            pathology results.                           - Resume Eliquis (apixaban) in 3 days at prior                            dose. Refer to managing physician for further                            adjustment of therapy.                           - Patient has a contact number available for                            emergencies. The signs and symptoms of potential                            delayed complications were discussed with the                            patient. Return to normal activities tomorrow.                            Written discharge instructions were provided to the                            patient.                           - High fiber diet.                           - Continue present medications.                           - Await pathology results. Ladene Artist, MD 03/26/2022 8:26:53 AM This report has been signed electronically.

## 2022-03-26 NOTE — Patient Instructions (Signed)
YOU HAD AN ENDOSCOPIC PROCEDURE TODAY AT [facility]:   Refer to the procedure report that was given to you for any specific questions about what was found during the examination.  If the procedure report does not answer your questions, please call your gastroenterologist to clarify.  If you requested that your care partner not be given the details of your procedure findings, then the procedure report has been included in a sealed envelope for you to review at your convenience later.  YOU SHOULD EXPECT: Some feelings of bloating in the abdomen. Passage of more gas than usual.  Walking can help get rid of the air that was put into your GI tract during the procedure and reduce the bloating. If you had a lower endoscopy (such as a colonoscopy or flexible sigmoidoscopy) you may notice spotting of blood in your stool or on the toilet paper. If you underwent a bowel prep for your procedure, you may not have a normal bowel movement for a few days.  Please Note:  You might notice some irritation and congestion in your nose or some drainage.  This is from the oxygen used during your procedure.  There is no need for concern and it should clear up in a day or so.  SYMPTOMS TO REPORT IMMEDIATELY:  Following lower endoscopy (colonoscopy or flexible sigmoidoscopy):  Excessive amounts of blood in the stool  Significant tenderness or worsening of abdominal pains  Swelling of the abdomen that is new, acute  Fever of 100F or higher   Black, tarry-looking stools  For urgent or emergent issues, a gastroenterologist can be reached at any hour by calling [phone]. Do not use MyChart messaging for urgent concerns.    DIET:  We do recommend a small meal at first, but then you may proceed to your regular diet.  Drink plenty of fluids but you should avoid alcoholic beverages for 24 hours. Follow a High Fiber diet (see handout given to you by your recovery nurse).  MEDICATIONS: Continue present  medications. RESUME ELIQUIS (Apixaban) in 3 DAYS at prior dose (Friday). Refer to managing physician for further adjustment in therapy.  Please see all handouts given to you by your recovery nurse: Polyps, Diverticulosis, Hemorrhoids, and High Fiber Diet.  FOLLOW UP: Await pathology results.   Thank you for allowing [place] to provide for your healthcare needs today.  ACTIVITY:  You should plan to take it easy for the rest of today and you should NOT DRIVE or use heavy machinery until tomorrow (because of the sedation medicines used during the test).    FOLLOW UP: Our staff will call the number listed on your records the next business day following your procedure.  We will call around 7:15- 8:00 am to check on you and address any questions or concerns that you may have regarding the information given to you following your procedure. If we do not reach you, we will leave a message.     If any biopsies were taken you will be contacted by phone or by letter within the next 1-3 weeks.  Please call us at [phone] if you have not heard about the biopsies in 3 weeks.    SIGNATURES/CONFIDENTIALITY: You and/or your care partner have signed paperwork which will be entered into your electronic medical record.  These signatures attest to the fact that that the information above on your After Visit Summary has been reviewed and is understood.  Full responsibility of the confidentiality of this discharge  information lies with you and/or your care-partner. 

## 2022-03-26 NOTE — Progress Notes (Signed)
History & Physical  Primary Care Physician:  Biagio Borg, MD Primary Gastroenterologist: Lucio Edward, MD  CHIEF COMPLAINT:  Personal history of colon polyps   HPI: David [name] is a 71 y.o. male with a personal history of adenomatous colon polyps for colonoscopy.   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   Anticardiolipin antibody positive    Anxiety    ANXIETY 07/19/2008   CEREBROVASCULAR ACCIDENT, HX OF 07/19/2008   CVA (cerebral vascular accident) ([place]) 2003   Depression    DEPRESSION 07/19/2008   Diabetes mellitus    type II   DIABETES MELLITUS, TYPE II 01/18/2008   Diverticulosis    GERD 07/19/2008   GERD (gastroesophageal reflux disease)    Hemorrhoids    Hx of adenomatous colonic polyps    Hyperlipidemia    HYPERLIPIDEMIA 07/19/2008   Hypertension    HYPERTENSION 07/31/2009   Increased prostate specific antigen (PSA) velocity 09/13/2010   Internal hemorrhoids    Mitral regurgitation    Echo 9/21: EF 55-60, no RWMA, normal RVSF, mild-moderate LAE, mild-moderate MR, mild aortic stenosis (mean gradient 9.2 mmHg)   Perineal abscess    Peripheral vascular disease ([place])    peripheral vascular disease/carotid artery <100%   PERSONAL HX COLONIC POLYPS 12/10/2007   Unspecified Peripheral Vascular Disease 01/18/2008   Varicose veins     Past Surgical History:  Procedure Laterality Date   7 abdominal hernia repairs  05/31/10   ABDOMINAL AORTAGRAM N/A 01/12/2013   Procedure: ABDOMINAL Maxcine Ham;  Surgeon: Serafina Mitchell, MD;  Location: [facility] CATH LAB;  Service: Cardiovascular;  Laterality: N/A;   ABDOMINAL AORTIC ANEURYSM REPAIR     ABDOMINAL AORTOGRAM W/LOWER EXTREMITY Bilateral 10/05/2019   Procedure: ABDOMINAL AORTOGRAM W/LOWER EXTREMITY;  Surgeon: Serafina Mitchell, MD;  Location: [facility] CV LAB;  Service: Cardiovascular;  Laterality:  Bilateral;   COLONOSCOPY     hernia surgury     71 years old   perineal abscess     PERIPHERAL VASCULAR CATHETERIZATION N/A 11/23/2014   Procedure: Abdominal Aortogram;  Surgeon: Serafina Mitchell, MD;  Location: [facility] CV LAB;  Service: Cardiovascular;  Laterality: N/A;   PERIPHERAL VASCULAR CATHETERIZATION  11/23/2014   Procedure: Lower Extremity Angiography;  Surgeon: Serafina Mitchell, MD;  Location: [facility] CV LAB;  Service: Cardiovascular;;   PERIPHERAL VASCULAR CATHETERIZATION  11/23/2014   Procedure: Peripheral Vascular Intervention;  Surgeon: Serafina Mitchell, MD;  Location: [facility] CV LAB;  Service: Cardiovascular;;  PTA bilateral common iliac PTA/DCB left fem-pop   POLYPECTOMY     s/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 Authorizing Provider  amLODipine (NORVASC) 10 MG tablet Take 1 tablet (10 mg total) by mouth daily. 11/21/21  Yes Biagio Borg, MD  Ascorbic Acid (VITAMIN C) 1000 MG tablet Take 1,000 mg by mouth daily.   Yes [provider]  aspirin EC 81 MG tablet Take 81 mg by mouth daily. Swallow whole.   Yes [provider]  atorvastatin (LIPITOR) 40 MG tablet TAKE ONE TABLET BY MOUTH ONCE DAILY 10/08/21  Yes Biagio Borg, MD  cetirizine (ZYRTEC) 10 MG tablet Take 1 tablet (10 mg total) by mouth daily. 08/18/14  Yes Biagio Borg, MD  Cholecalciferol (VITAMIN D) 50 MCG (2000 UT) tablet Take 2,000 Units by mouth daily.   Yes [provider]  docusate sodium (COLACE) 100 MG capsule Take 100 mg by mouth 2 (two) times daily.   Yes [provider]  famotidine (PEPCID) 20 MG tablet Take 20 mg by mouth 2 (two) times daily.   Yes [provider]  FARXIGA 10 MG TABS tablet Take 10 mg by mouth daily. 05/17/20  Yes [provider]  folic acid (FOLVITE) 924 MCG tablet Take 800 mcg by mouth daily.   Yes [provider]  furosemide (LASIX) 40 MG tablet Take 40 mg by mouth daily.   Yes [provider]  gemfibrozil (LOPID) 600 MG tablet TAKE TWO TABLETS BY MOUTH ONCE DAILY 10/08/21  Yes Biagio Borg, MD  metFORMIN (GLUCOPHAGE-XR) 500 MG 24 hr tablet TAKE 4 TABLETS BY MOUTH EVERY MORNING 02/22/22  Yes Biagio Borg, MD  olmesartan (BENICAR) 40 MG tablet Take 1 tablet (40 mg total) by mouth daily. Follow-up appt due in August must see provider for future refills 10/05/18  Yes Biagio Borg, MD  Omega-3 Fatty Acids (FISH OIL) 1000 MG CAPS Take 1,000 mg by mouth daily.   Yes [provider]  triamcinolone (NASACORT) 55 MCG/ACT AERO nasal inhaler Place 2 sprays into the nose daily. Patient taking differently: Place 1 spray into the nose daily as needed (allergies). 06/18/17  Yes Biagio Borg, MD  ELIQUIS 5 MG TABS tablet TAKE 1 TABLET TWICE A DAY 01/21/22   [name], MD  metroNIDAZOLE (METROGEL) 0.75 % gel Apply 1 application topically every other day.  Patient not taking: Reported on 03/26/2022 10/09/14   [provider]  Multiple Vitamin (MULTIVITAMIN) capsule Take 1 capsule by mouth daily.    [provider]  psyllium (FIBER LAXATIVE) 0.52 g capsule Take 3.36 g by mouth daily with lunch.    [provider]    Current Outpatient Medications  Medication Sig Dispense Refill   amLODipine (NORVASC) 10 MG tablet Take 1 tablet (10 mg total) by mouth daily. 90 tablet 3   Ascorbic Acid (VITAMIN C) 1000 MG tablet Take 1,000 mg by mouth daily.     aspirin EC 81 MG tablet Take 81 mg by mouth daily. Swallow whole.     atorvastatin (LIPITOR) 40 MG tablet TAKE ONE TABLET BY MOUTH ONCE DAILY 90 tablet 2   cetirizine (ZYRTEC) 10 MG tablet Take 1 tablet (10 mg total) by mouth daily. 90 tablet 3   Cholecalciferol (VITAMIN D) 50 MCG (2000 UT) tablet Take 2,000 Units by mouth daily.     docusate sodium (COLACE) 100 MG capsule Take 100 mg by mouth 2 (two) times  daily.     famotidine (PEPCID) 20 MG tablet Take 20 mg by mouth 2 (two) times daily.     FARXIGA 10 MG TABS tablet Take 10 mg by mouth daily.     folic acid (FOLVITE) 268 MCG tablet Take 800 mcg by mouth daily.     furosemide (LASIX) 40 MG tablet Take 40 mg by mouth daily.     gemfibrozil (LOPID) 600 MG tablet TAKE TWO TABLETS BY MOUTH ONCE DAILY 180 tablet 2   metFORMIN (GLUCOPHAGE-XR) 500 MG 24 hr tablet TAKE 4 TABLETS BY MOUTH EVERY MORNING 90 tablet 1   olmesartan (BENICAR) 40 MG tablet Take 1 tablet (40 mg total) by mouth daily. Follow-up appt due in August must see  provider for future refills 90 tablet 0   Omega-3 Fatty Acids (FISH OIL) 1000 MG CAPS Take 1,000 mg by mouth daily.     triamcinolone (NASACORT) 55 MCG/ACT AERO nasal inhaler Place 2 sprays into the nose daily. (Patient taking differently: Place 1 spray into the nose daily as needed (allergies).) 1 Inhaler 12   ELIQUIS 5 MG TABS tablet TAKE 1 TABLET TWICE A DAY 180 tablet 1   metroNIDAZOLE (METROGEL) 0.75 % gel Apply 1 application topically every other day.  (Patient not taking: Reported on 03/26/2022)     Multiple Vitamin (MULTIVITAMIN) capsule Take 1 capsule by mouth daily.     psyllium (FIBER LAXATIVE) 0.52 g capsule Take 3.36 g by mouth daily with lunch.     Current Facility-Administered Medications  Medication Dose Route Frequency Provider Last Rate Last Admin   0.9 %  sodium chloride infusion  500 mL Intravenous Continuous Ladene Artist, MD        Allergies as of 03/26/2022 - Review Complete 03/26/2022  Allergen Reaction Noted   Latex  12/01/2020   Latex Rash 08/03/2010    Family History  Problem Relation Age of Onset   Aortic aneurysm Mother        desceding aoritic aneurysm   Cancer Brother        Lung cancer   Colon cancer Neg Hx    Esophageal cancer Neg Hx    Stomach cancer Neg Hx    Rectal cancer Neg Hx     Social History   Socioeconomic History   Marital status: Married    Spouse name: Not on  file   Number of children: 1   Years of education: Not on file   Highest education level: Not on file  Occupational History   Occupation: dsiabled due to stroke    Employer: RETIRED  Tobacco Use   Smoking status: Former    Types: Cigarettes    Quit date: 01/14/1992    Years since quitting: 30.2   Smokeless tobacco: Never  Vaping Use   Vaping Use: Never used  Substance and Sexual Activity   Alcohol use: Yes    Alcohol/week: 2.0 standard drinks of alcohol    Types: 2 Glasses of wine per week    Comment: wine daily   Drug use: No   Sexual activity: Not on file  Other Topics Concern   Not on file  Social History Narrative   2 cups of coffee in the morning and tea throughout the day.   Social Determinants of Health   Financial Resource Strain: Not on file  Food Insecurity: Not on file  Transportation Needs: Not on file  Physical Activity: Not on file  Stress: Not on file  Social Connections: Not on file  Intimate Partner Violence: Not on file    Review of Systems:  All systems reviewed were negative except where noted in HPI.   Physical Exam: General:  Alert, well-developed, in NAD Head:  Normocephalic and atraumatic. Eyes:  Sclera clear, no icterus.   Conjunctiva pink. Ears:  Normal auditory acuity. Mouth:  No deformity or lesions.  Neck:  Supple; no masses . Lungs:  Clear throughout to auscultation.   No wheezes, crackles, or rhonchi. No acute distress. Heart:  Regular rate and rhythm; no murmurs. Abdomen:  Soft, nondistended, nontender. No masses, hepatomegaly. No obvious masses.  Normal bowel .    Rectal:  Deferred   Msk:  Symmetrical without gross deformities.. Pulses:  Normal pulses noted. Extremities:  Without edema. Neurologic:  Alert and  oriented x4;  grossly normal neurologically. Skin:  Intact without significant lesions or rashes. Cervical Nodes:  No significant cervical adenopathy. Psych:  Alert and cooperative. Normal mood and  affect.   Impression / Plan:   Personal history of adenomatous colon polyps for colonoscopy.  Pricilla Riffle. [place] Plan  03/26/2022, 7:58 AM See Shea Evans, [facility], to contact our on call provider

## 2022-03-26 NOTE — Progress Notes (Signed)
Called to room to assist during endoscopic procedure.  Patient ID and intended procedure confirmed with present staff. Received instructions for my participation in the procedure from the performing physician.  

## 2022-03-26 NOTE — Progress Notes (Signed)
Report to PACU, RN, vss, BBS= Clear.  

## 2022-03-27 ENCOUNTER — Telehealth: Payer: Self-pay

## 2022-03-27 NOTE — Telephone Encounter (Signed)
No answer on follow up call. 

## 2022-04-09 ENCOUNTER — Encounter: Payer: Self-pay | Admitting: Gastroenterology

## 2022-04-10 ENCOUNTER — Encounter: Payer: Self-pay | Admitting: Gastroenterology

## 2022-05-24 ENCOUNTER — Ambulatory Visit: Payer: Medicare Other | Admitting: Internal Medicine

## 2022-05-24 ENCOUNTER — Ambulatory Visit (INDEPENDENT_AMBULATORY_CARE_PROVIDER_SITE_OTHER): Payer: Medicare Other | Admitting: Internal Medicine

## 2022-05-24 VITALS — BP 140/60 | HR 50 | Temp 97.9°F | Ht 66.0 in | Wt 245.0 lb

## 2022-05-24 DIAGNOSIS — Z0001 Encounter for general adult medical examination with abnormal findings: Secondary | ICD-10-CM | POA: Insufficient documentation

## 2022-05-24 DIAGNOSIS — E1121 Type 2 diabetes mellitus with diabetic nephropathy: Secondary | ICD-10-CM | POA: Diagnosis not present

## 2022-05-24 DIAGNOSIS — N183 Chronic kidney disease, stage 3 unspecified: Secondary | ICD-10-CM

## 2022-05-24 DIAGNOSIS — E559 Vitamin D deficiency, unspecified: Secondary | ICD-10-CM | POA: Diagnosis not present

## 2022-05-24 DIAGNOSIS — J309 Allergic rhinitis, unspecified: Secondary | ICD-10-CM

## 2022-05-24 DIAGNOSIS — E7800 Pure hypercholesterolemia, unspecified: Secondary | ICD-10-CM | POA: Diagnosis not present

## 2022-05-24 DIAGNOSIS — Z125 Encounter for screening for malignant neoplasm of prostate: Secondary | ICD-10-CM | POA: Diagnosis not present

## 2022-05-24 DIAGNOSIS — E538 Deficiency of other specified B group vitamins: Secondary | ICD-10-CM

## 2022-05-24 DIAGNOSIS — I10 Essential (primary) hypertension: Secondary | ICD-10-CM

## 2022-05-24 LAB — BASIC METABOLIC PANEL
BUN: 45 mg/dL — ABNORMAL HIGH (ref 6–23)
CO2: 24 mEq/L (ref 19–32)
Calcium: 10.4 mg/dL (ref 8.4–10.5)
Chloride: 102 mEq/L (ref 96–112)
Creatinine, Ser: 1.7 mg/dL — ABNORMAL HIGH (ref 0.40–1.50)
GFR: 39.46 mL/min — ABNORMAL LOW (ref >60.00)
Glucose, Bld: 180 mg/dL — ABNORMAL HIGH (ref 70–99)
Potassium: 5 mEq/L (ref 3.5–5.1)
Sodium: 140 mEq/L (ref 135–145)

## 2022-05-24 LAB — CBC WITH DIFFERENTIAL/PLATELET
Basophils Absolute: 0.1 10*3/uL (ref 0.0–0.1)
Basophils Relative: 2.3 % (ref 0.0–3.0)
Eosinophils Absolute: 0.6 10*3/uL (ref 0.0–0.7)
Eosinophils Relative: 9.7 % — ABNORMAL HIGH (ref 0.0–5.0)
HCT: 39 % (ref 39.0–52.0)
Hemoglobin: 13.5 g/dL (ref 13.0–17.0)
Lymphocytes Relative: 15.8 % (ref 12.0–46.0)
Lymphs Abs: 0.9 10*3/uL (ref 0.7–4.0)
MCHC: 34.5 g/dL (ref 30.0–36.0)
MCV: 97 fl (ref 78.0–100.0)
Monocytes Absolute: 0.7 10*3/uL (ref 0.1–1.0)
Monocytes Relative: 11.5 % (ref 3.0–12.0)
Neutro Abs: 3.7 10*3/uL (ref 1.4–7.7)
Neutrophils Relative %: 60.7 % (ref 43.0–77.0)
Platelets: 255 10*3/uL (ref 150.0–400.0)
RBC: 4.02 Mil/uL — ABNORMAL LOW (ref 4.22–5.81)
RDW: 11.7 % (ref 11.5–15.5)
WBC: 6 10*3/uL (ref 4.0–10.5)

## 2022-05-24 LAB — HEPATIC FUNCTION PANEL
ALT: 22 U/L (ref 0–53)
AST: 28 U/L (ref 0–37)
Albumin: 4.8 g/dL (ref 3.5–5.2)
Alkaline Phosphatase: 40 U/L (ref 39–117)
Bilirubin, Direct: 0.1 mg/dL (ref 0.0–0.3)
Total Bilirubin: 0.5 mg/dL (ref 0.2–1.2)
Total Protein: 8.1 g/dL (ref 6.0–8.3)

## 2022-05-24 LAB — LIPID PANEL
Cholesterol: 157 mg/dL (ref 0–200)
HDL: 42 mg/dL (ref >39.00)
NonHDL: 114.97
Total CHOL/HDL Ratio: 4
Triglycerides: 246 mg/dL — ABNORMAL HIGH (ref 0.0–149.0)
VLDL: 49.2 mg/dL — ABNORMAL HIGH (ref 0.0–40.0)

## 2022-05-24 LAB — VITAMIN B12: Vitamin B-12: 805 pg/mL (ref 211–911)

## 2022-05-24 LAB — TSH: TSH: 1.64 u[IU]/mL (ref 0.35–5.50)

## 2022-05-24 LAB — PSA: PSA: 1.62 ng/mL (ref 0.10–4.00)

## 2022-05-24 LAB — VITAMIN D 25 HYDROXY (VIT D DEFICIENCY, FRACTURES): VITD: 28.15 ng/mL — ABNORMAL LOW (ref 30.00–100.00)

## 2022-05-24 LAB — LDL CHOLESTEROL, DIRECT: Direct LDL: 64 mg/dL

## 2022-05-24 LAB — HEMOGLOBIN A1C: Hgb A1c MFr Bld: 6.6 % — ABNORMAL HIGH (ref 4.6–6.5)

## 2022-05-24 NOTE — Patient Instructions (Signed)
Ok to restart the nasacort for the allergies   Please continue all other medications as before, and refills have been done if requested.  Please have the pharmacy call with any other refills you may need.  Please continue your efforts at being more active, low cholesterol diet, and weight control.  You are otherwise up to date with prevention measures today.  Please keep your appointments with your specialists as you may have planned  Please go to the LAB at the blood drawing area for the tests to be done  You will be contacted by phone if any changes need to be made immediately.  Otherwise, you will receive a letter about your results with an explanation, but please check with MyChart first.  Please remember to sign up for MyChart if you have not done so, as this will be important to you in the future with finding out test results, communicating by private email, and scheduling acute appointments online when needed.  Please make an Appointment to return in 6 months, or sooner if needed

## 2022-05-24 NOTE — Progress Notes (Signed)
Patient ID: David [name], male   DOB: 1950-05-24, 72 y.o.   MRN: [id number]         Chief Complaint:: wellness exam and allergies, dm, low b12, ckd3a, htn, hld       HPI:  David [name] is a 72 y.o. male here for wellness exameclines covid booster, has eye exam coming in mar 2024; o/w up todate                        Also Pt denies chest pain, increased sob or doe, wheezing, orthopnea, PND, increased LE swelling, palpitations, dizziness or syncope.   Pt denies polydipsia, polyuria, or new focal neuro s/s.    Pt denies fever, wt loss, night sweats, loss of appetite, or other constitutional symptoms    Gained significant wt over the holidays, but pt disputes the 213 as being accurate.  Does have several wks ongoing nasal allergy symptoms with clearish congestion, itch and sneezing, without fever, pain, ST, cough, swelling or wheezing.  States BP controlled at home  Wt Readings from Last 3 Encounters:  05/24/22 245 lb (111.1 kg)  03/26/22 213 lb (96.6 kg)  01/11/22 213 lb 8 oz (96.8 kg)   BP Readings from Last 3 Encounters:  05/24/22 (!) 140/60  03/26/22 (!) 147/67  01/11/22 (!) 142/62   Immunization History  Administered Date(s) Administered   Fluad Quad(high Dose 65+) 12/23/2018, 02/16/2020, 03/22/2021   Influenza, High Dose Seasonal PF 03/03/2018   PFIZER Comirnaty(Gray Top)Covid-19 Tri-Sucrose Vaccine 08/18/2020   PFIZER(Purple Top)SARS-COV-2 Vaccination 06/11/2019, 07/06/2019, 03/17/2020   Pfizer Covid-19 Vaccine Bivalent Booster 69yr & up 04/12/2021   Pneumococcal Conjugate-13 08/18/2014   Pneumococcal Polysaccharide-23 07/19/2008, 07/21/2013, 01/13/2020   Td 07/19/2008   Tdap 12/23/2018   Zoster Recombinat (Shingrix) 03/30/2019, 05/17/2019   Zoster, Live 11/22/2011   Health Maintenance Due  Topic Date Due   Medicare Annual Wellness (AWV)  Never done   Diabetic kidney evaluation - Urine ACR  10/22/2018      Past Medical History:  Diagnosis Date   AAA (abdominal  aortic aneurysm) ([place]    Adenomatous colon polyp 04/2005   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 ([place] 09/07/2008   Anticardiolipin antibody positive    Anxiety    ANXIETY 07/19/2008   CEREBROVASCULAR ACCIDENT, HX OF 07/19/2008   CVA (cerebral vascular accident) [place]   Depression    DEPRESSION 07/19/2008   Diabetes mellitus    type II   DIABETES MELLITUS, TYPE II 01/18/2008   Diverticulosis    GERD 07/19/2008   GERD (gastroesophageal reflux disease)    Hemorrhoids    Hx of adenomatous colonic polyps    Hyperlipidemia    HYPERLIPIDEMIA 07/19/2008   Hypertension    HYPERTENSION 07/31/2009   Increased prostate specific antigen (PSA) velocity 09/13/2010   Internal hemorrhoids    Mitral regurgitation    Echo 9/21: EF 55-60, no RWMA, normal RVSF, mild-moderate LAE, mild-moderate MR, mild aortic stenosis (mean gradient 9.2 mmHg)   Perineal abscess    Peripheral vascular disease ([place]    peripheral vascular disease/carotid artery <100%   PERSONAL HX COLONIC POLYPS 12/10/2007   Unspecified Peripheral Vascular Disease 01/18/2008   Varicose veins    Past Surgical History:  Procedure Laterality Date   7 abdominal hernia repairs  05/31/10   ABDOMINAL AORTAGRAM N/A 01/12/2013   Procedure: ABDOMINAL AMaxcine Ham  Surgeon: VSerafina Mitchell MD;  [facility];  Service: Cardiovascular;  Laterality: N/A;  ABDOMINAL AORTIC ANEURYSM REPAIR     ABDOMINAL AORTOGRAM W/LOWER EXTREMITY Bilateral 10/05/2019   Procedure: ABDOMINAL AORTOGRAM W/LOWER EXTREMITY;  Surgeon: Serafina Mitchell, MD;  Location: [place] CV LAB;  Service: Cardiovascular;  Laterality: Bilateral;   COLONOSCOPY     hernia surgury     72 years old   perineal abscess     PERIPHERAL VASCULAR CATHETERIZATION N/A 11/23/2014   Procedure: Abdominal Aortogram;  Surgeon: Serafina Mitchell, MD;  Location: [place] CV LAB;  Service: Cardiovascular;  Laterality: N/A;   PERIPHERAL VASCULAR CATHETERIZATION  11/23/2014   Procedure: Lower  Extremity Angiography;  Surgeon: Serafina Mitchell, MD;  Location: [place] CV LAB;  Service: Cardiovascular;;   PERIPHERAL VASCULAR CATHETERIZATION  11/23/2014   Procedure: Peripheral Vascular Intervention;  Surgeon: Serafina Mitchell, MD;  Location: [place] CV LAB;  Service: Cardiovascular;;  PTA bilateral common iliac PTA/DCB left fem-pop   POLYPECTOMY     s/p right and left popliteal aneurysm repair     SP ENDO REPAIR INFRAREN AAA      reports that he quit smoking about 30 years ago. His smoking use included cigarettes. He has never used smokeless tobacco. He reports current alcohol use of about 2.0 standard drinks of alcohol per week. He reports that he does not use drugs. family history includes Aortic aneurysm in his mother; Cancer in his brother. Allergies  Allergen Reactions   Latex    Latex Rash   Current Outpatient Medications on File Prior to Visit  Medication Sig Dispense Refill   amLODipine (NORVASC) 10 MG tablet Take 1 tablet (10 mg total) by mouth daily. 90 tablet 3   Ascorbic Acid (VITAMIN C) 1000 MG tablet Take 1,000 mg by mouth daily.     aspirin EC 81 MG tablet Take 81 mg by mouth daily. Swallow whole.     atorvastatin (LIPITOR) 40 MG tablet TAKE ONE TABLET BY MOUTH ONCE DAILY 90 tablet 2   cetirizine (ZYRTEC) 10 MG tablet Take 1 tablet (10 mg total) by mouth daily. 90 tablet 3   Cholecalciferol (VITAMIN D) 50 MCG (2000 UT) tablet Take 2,000 Units by mouth daily.     docusate sodium (COLACE) 100 MG capsule Take 100 mg by mouth 2 (two) times daily.     ELIQUIS 5 MG TABS tablet TAKE 1 TABLET TWICE A DAY 180 tablet 1   famotidine (PEPCID) 20 MG tablet Take 20 mg by mouth 2 (two) times daily.     FARXIGA 10 MG TABS tablet Take 10 mg by mouth daily.     FARXIGA 10 MG TABS tablet 10 mg daily.     folic acid (FOLVITE) 106 MCG tablet Take 800 mcg by mouth daily.     furosemide (LASIX) 40 MG tablet Take 40 mg by mouth daily.     gemfibrozil (LOPID) 600 MG tablet TAKE TWO  TABLETS BY MOUTH ONCE DAILY 180 tablet 2   metFORMIN (GLUCOPHAGE-XR) 500 MG 24 hr tablet TAKE 4 TABLETS BY MOUTH EVERY MORNING 90 tablet 1   metroNIDAZOLE (METROGEL) 0.75 % gel Apply 1 application  topically every other day.     olmesartan (BENICAR) 40 MG tablet Take 1 tablet (40 mg total) by mouth daily. Follow-up appt due in August must see provider for future refills 90 tablet 0   Omega-3 Fatty Acids (FISH OIL) 1000 MG CAPS Take 1,000 mg by mouth daily.     psyllium (FIBER LAXATIVE) 0.52 g capsule Take 3.36 g by mouth daily with  lunch.     triamcinolone (NASACORT) 55 MCG/ACT AERO nasal inhaler Place 2 sprays into the nose daily. (Patient taking differently: Place 1 spray into the nose daily as needed (allergies).) 1 Inhaler 12   Multiple Vitamin (MULTIVITAMIN) capsule Take 1 capsule by mouth daily.     Current Facility-Administered Medications on File Prior to Visit  Medication Dose Route Frequency Provider Last Rate Last Admin   0.9 %  sodium chloride infusion  500 mL Intravenous Continuous Ladene Artist, MD            ROS:  All others reviewed and negative.  Objective        PE:  BP (!) 140/60 (BP Location: Right Arm, Patient Position: Sitting, Cuff Size: Large)   Pulse (!) 50   Temp 97.9 F (36.6 C) (Oral)   Ht '5\' 6"'$  (1.676 m)   Wt 245 lb (111.1 kg)   SpO2 98%   BMI 39.54 kg/m                 Constitutional: Pt appears in NAD               HENT: Head: NCAT.                Right Ear: External ear normal.                 Left Ear: External ear normal. Bilat tm's with mild erythema.  Max sinus areas non tender.  Pharynx with mild erythema, no exudate               Eyes: . Pupils are equal, round, and reactive to light. Conjunctivae and EOM are normal               Nose: without d/c or deformity               Neck: Neck supple. Gross normal ROM               Cardiovascular: Normal rate and regular rhythm.                 Pulmonary/Chest: Effort normal and breath sounds without  rales or wheezing.                Abd:  Soft, NT, ND, + BS, no organomegaly               Neurological: Pt is alert. At baseline orientation, motor grossly intact               Skin: Skin is warm. No rashes, no other new lesions, LE edema - none               Psychiatric: Pt behavior is normal without agitation   Micro: none  Cardiac tracings I have personally interpreted today:  none  Pertinent Radiological findings (summarize): none   Lab Results  Component Value Date   WBC 6.0 05/24/2022   HGB 13.5 05/24/2022   HCT 39.0 05/24/2022   PLT 255.0 05/24/2022   GLUCOSE 180 (H) 05/24/2022   CHOL 157 05/24/2022   TRIG 246.0 (H) 05/24/2022   HDL 42.00 05/24/2022   LDLDIRECT 64.0 05/24/2022   LDLCALC 53 05/24/2021   ALT 22 05/24/2022   AST 28 05/24/2022   NA 140 05/24/2022   K 5.0 05/24/2022   CL 102 05/24/2022   CREATININE 1.70 (H) 05/24/2022   BUN 45 (H) 05/24/2022   CO2 24 05/24/2022   TSH 1.64 05/24/2022  PSA 1.62 05/24/2022   INR 1.17 10/16/2017   HGBA1C 6.6 (H) 05/24/2022   MICROALBUR 173.4 (H) 11/12/2016   Assessment/Plan:  David [name] is a 72 y.o. 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[place]) (09/07/2008), Anticardiolipin antibody positive, Anxiety, ANXIETY (07/19/2008), CEREBROVASCULAR ACCIDENT, HX OF (07/19/2008), CVA (cerebral vascular accident) ([place]) (2003), Depression, DEPRESSION (07/19/2008), Diabetes mellitus, DIABETES MELLITUS, TYPE II (01/18/2008), Diverticulosis, GERD (07/19/2008), GERD (gastroesophageal reflux disease), Hemorrhoids, adenomatous colonic polyps, Hyperlipidemia, HYPERLIPIDEMIA (07/19/2008), Hypertension, HYPERTENSION (07/31/2009), Increased prostate specific antigen (PSA) velocity (09/13/2010), Internal hemorrhoids, Mitral regurgitation, Perineal abscess, Peripheral vascular disease ([place]), PERSONAL HX COLONIC POLYPS  (12/10/2007), Unspecified Peripheral Vascular Disease (01/18/2008), and Varicose veins.  Encounter for well adult exam with abnormal findings Age and sex appropriate education and counseling updated with regular exercise and diet Referrals for preventative services - for eye exam mar 2024 Immunizations addressed - declines covid booster Smoking counseling  - none needed Evidence for depression or other mood disorder - none significant Most recent labs reviewed. I have personally reviewed and have noted: 1) the patient's medical and social history 2) The patient's current medications and supplements 3) The patient's height, weight, and BMI have been recorded in the chart   B12 deficiency Lab Results  Component Value Date   VITAMINB12 805 05/24/2022   Stable, cont oral replacement - b12 1000 mcg qd   CKD (chronic kidney disease) Lab Results  Component Value Date   CREATININE 1.70 (H) 05/24/2022   Stable overall, cont to avoid nephrotoxins  Diabetes ([place]) Lab Results  Component Value Date   HGBA1C 6.6 (H) 05/24/2022   Stable, pt to continue current medical treatment farxiga 10 mg qd, metformin ER 4 tab in am   Essential hypertension BP Readings from Last 3 Encounters:  05/24/22 (!) 140/60  03/26/22 (!) 147/67  01/11/22 (!) 142/62   Stable, pt to continue medical treatment norvasc 10 mg qd, benicar 40 mg qd   HLD (hyperlipidemia) Lab Results  Component Value Date   LDLCALC 53 05/24/2021   Stable, pt to continue current statin lipitor 40 mg qd, lopid   Vitamin D deficiency Last vitamin D Lab Results  Component Value Date   VD25OH 28.15 (L) 05/24/2022   Low, to start oral replacement  Allergic rhinitis Uncontrolled with seasonal worsening, for restart nasacort asd  Followup: Return in about 6 months (around 11/22/2022).  Cathlean Cower, MD 05/25/2022 10:13 PM [facility]

## 2022-05-25 ENCOUNTER — Encounter: Payer: Self-pay | Admitting: Internal Medicine

## 2022-05-25 NOTE — Assessment & Plan Note (Signed)
BP Readings from Last 3 Encounters:  05/24/22 (!) 140/60  03/26/22 (!) 147/67  01/11/22 (!) 142/62   Stable, pt to continue medical treatment norvasc 10 mg qd, benicar 40 mg qd

## 2022-05-25 NOTE — Assessment & Plan Note (Signed)
Lab Results  Component Value Date   LDLCALC 53 05/24/2021   Stable, pt to continue current statin lipitor 40 mg qd, lopid

## 2022-05-25 NOTE — Assessment & Plan Note (Signed)
Lab Results  Component Value Date   HGBA1C 6.6 (H) 05/24/2022   Stable, pt to continue current medical treatment farxiga 10 mg qd, metformin ER 4 tab in am

## 2022-05-25 NOTE — Assessment & Plan Note (Signed)
Age and sex appropriate education and counseling updated with regular exercise and diet Referrals for preventative services - for eye exam mar 2024 Immunizations addressed - declines covid booster Smoking counseling  - none needed Evidence for depression or other mood disorder - none significant Most recent labs reviewed. I have personally reviewed and have noted: 1) the patient's medical and social history 2) The patient's current medications and supplements 3) The patient's height, weight, and BMI have been recorded in the chart

## 2022-05-25 NOTE — Assessment & Plan Note (Signed)
Lab Results  Component Value Date   VITAMINB12 805 05/24/2022   Stable, cont oral replacement - b12 1000 mcg qd

## 2022-05-25 NOTE — Assessment & Plan Note (Signed)
Last vitamin D Lab Results  Component Value Date   VD25OH 28.15 (L) 05/24/2022   Low, to start oral replacement

## 2022-05-25 NOTE — Assessment & Plan Note (Signed)
Uncontrolled with seasonal worsening, for restart nasacort asd

## 2022-05-25 NOTE — Assessment & Plan Note (Signed)
Lab Results  Component Value Date   CREATININE 1.70 (H) 05/24/2022   Stable overall, cont to avoid nephrotoxins

## 2022-06-09 NOTE — Progress Notes (Unsigned)
No chief complaint on file.   History of Present Illness: 72 yo male with history of mitral regurgitation, HTN, HLD, DM, persistent atrial fibrillation, PAD, AAA status post open repair 2012, remote CVA, GERD here today for cardiac follow up. He was found to have atrial fibrillation in 2016 while in the hospital for a PV procedure. He has been on Eliquis and Plavix. He has not been on AV nodal blocking agents. His PAD is followed by Dr. Trula Slade.  Nuclear stress test 2010 with no ischemia. Echo September 2021 with normal LV systolic function, YHCW=23-76%, mild to moderate MR, mild AS. His wife is also my patient.   He is here today for follow up. The patient denies any chest pain, dyspnea, palpitations, lower extremity edema, orthopnea, PND, dizziness, near syncope or syncope.    Primary Care Physician: Biagio Borg, MD   Past Medical History:  Diagnosis Date   AAA (abdominal aortic aneurysm) ([place])    Adenomatous colon polyp 04/2005   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 ([place]) 09/07/2008   Anticardiolipin antibody positive    Anxiety    ANXIETY 07/19/2008   CEREBROVASCULAR ACCIDENT, HX OF 07/19/2008   CVA (cerebral vascular accident) ([place]) 2003   Depression    DEPRESSION 07/19/2008   Diabetes mellitus    type II   DIABETES MELLITUS, TYPE II 01/18/2008   Diverticulosis    GERD 07/19/2008   GERD (gastroesophageal reflux disease)    Hemorrhoids    Hx of adenomatous colonic polyps    Hyperlipidemia    HYPERLIPIDEMIA 07/19/2008   Hypertension    HYPERTENSION 07/31/2009   Increased prostate specific antigen (PSA) velocity 09/13/2010   Internal hemorrhoids    Mitral regurgitation    Echo 9/21: EF 55-60, no RWMA, normal RVSF, mild-moderate LAE, mild-moderate MR, mild aortic stenosis (mean gradient 9.2 mmHg)   Perineal abscess    Peripheral vascular disease ([place])    peripheral vascular disease/carotid artery <100%   PERSONAL HX COLONIC POLYPS 12/10/2007   Unspecified Peripheral Vascular  Disease 01/18/2008   Varicose veins     Past Surgical History:  Procedure Laterality Date   7 abdominal hernia repairs  05/31/10   ABDOMINAL AORTAGRAM N/A 01/12/2013   Procedure: ABDOMINAL Maxcine Ham;  Surgeon: Serafina Mitchell, MD;  Location: [facility] CATH LAB;  Service: Cardiovascular;  Laterality: N/A;   ABDOMINAL AORTIC ANEURYSM REPAIR     ABDOMINAL AORTOGRAM W/LOWER EXTREMITY Bilateral 10/05/2019   Procedure: ABDOMINAL AORTOGRAM W/LOWER EXTREMITY;  Surgeon: Serafina Mitchell, MD;  Location: [place] CV LAB;  Service: Cardiovascular;  Laterality: Bilateral;   COLONOSCOPY     hernia surgury     72 years old   perineal abscess     PERIPHERAL VASCULAR CATHETERIZATION N/A 11/23/2014   Procedure: Abdominal Aortogram;  Surgeon: Serafina Mitchell, MD;  Location: [place] CV LAB;  Service: Cardiovascular;  Laterality: N/A;   PERIPHERAL VASCULAR CATHETERIZATION  11/23/2014   Procedure: Lower Extremity Angiography;  Surgeon: Serafina Mitchell, MD;  Location: [place] CV LAB;  Service: Cardiovascular;;   PERIPHERAL VASCULAR CATHETERIZATION  11/23/2014   Procedure: Peripheral Vascular Intervention;  Surgeon: Serafina Mitchell, MD;  Location: [place] CV LAB;  Service: Cardiovascular;;  PTA bilateral common iliac PTA/DCB left fem-pop   POLYPECTOMY     s/p right and left popliteal aneurysm repair     SP ENDO REPAIR INFRAREN AAA      Current Outpatient Medications  Medication Sig Dispense Refill   amLODipine (NORVASC) 10  MG tablet Take 1 tablet (10 mg total) by mouth daily. 90 tablet 3   Ascorbic Acid (VITAMIN C) 1000 MG tablet Take 1,000 mg by mouth daily.     aspirin EC 81 MG tablet Take 81 mg by mouth daily. Swallow whole.     atorvastatin (LIPITOR) 40 MG tablet TAKE ONE TABLET BY MOUTH ONCE DAILY 90 tablet 2   cetirizine (ZYRTEC) 10 MG tablet Take 1 tablet (10 mg total) by mouth daily. 90 tablet 3   Cholecalciferol (VITAMIN D) 50 MCG (2000 UT) tablet Take 2,000 Units by mouth daily.     docusate  sodium (COLACE) 100 MG capsule Take 100 mg by mouth 2 (two) times daily.     ELIQUIS 5 MG TABS tablet TAKE 1 TABLET TWICE A DAY 180 tablet 1   famotidine (PEPCID) 20 MG tablet Take 20 mg by mouth 2 (two) times daily.     FARXIGA 10 MG TABS tablet Take 10 mg by mouth daily.     FARXIGA 10 MG TABS tablet 10 mg daily.     folic acid (FOLVITE) 353 MCG tablet Take 800 mcg by mouth daily.     furosemide (LASIX) 40 MG tablet Take 40 mg by mouth daily.     gemfibrozil (LOPID) 600 MG tablet TAKE TWO TABLETS BY MOUTH ONCE DAILY 180 tablet 2   metFORMIN (GLUCOPHAGE-XR) 500 MG 24 hr tablet TAKE 4 TABLETS BY MOUTH EVERY MORNING 90 tablet 1   metroNIDAZOLE (METROGEL) 0.75 % gel Apply 1 application  topically every other day.     Multiple Vitamin (MULTIVITAMIN) capsule Take 1 capsule by mouth daily.     olmesartan (BENICAR) 40 MG tablet Take 1 tablet (40 mg total) by mouth daily. Follow-up appt due in August must see provider for future refills 90 tablet 0   Omega-3 Fatty Acids (FISH OIL) 1000 MG CAPS Take 1,000 mg by mouth daily.     psyllium (FIBER LAXATIVE) 0.52 g capsule Take 3.36 g by mouth daily with lunch.     triamcinolone (NASACORT) 55 MCG/ACT AERO nasal inhaler Place 2 sprays into the nose daily. (Patient taking differently: Place 1 spray into the nose daily as needed (allergies).) 1 Inhaler 12   Current Facility-Administered Medications  Medication Dose Route Frequency Provider Last Rate Last Admin   0.9 %  sodium chloride infusion  500 mL Intravenous Continuous Ladene Artist, MD        Allergies  Allergen Reactions   Latex    Latex Rash    Social History   Socioeconomic History   Marital status: Married    Spouse name: Not on file   Number of children: 1   Years of education: Not on file   Highest education level: Not on file  Occupational History   Occupation: dsiabled due to stroke    Employer: RETIRED  Tobacco Use   Smoking status: Former    Types: Cigarettes    Quit date:  01/14/1992    Years since quitting: 30.4   Smokeless tobacco: Never  Vaping Use   Vaping Use: Never used  Substance and Sexual Activity   Alcohol use: Yes    Alcohol/week: 2.0 standard drinks of alcohol    Types: 2 Glasses of wine per week    Comment: wine daily   Drug use: No   Sexual activity: Not on file  Other Topics Concern   Not on file  Social History Narrative   2 cups of coffee in the morning and  tea throughout the day.   Social Determinants of Health   Financial Resource Strain: Not on file  Food Insecurity: Not on file  Transportation Needs: Not on file  Physical Activity: Not on file  Stress: Not on file  Social Connections: Not on file  Intimate Partner Violence: Not on file    Family History  Problem Relation Age of Onset   Aortic aneurysm Mother        desceding aoritic aneurysm   Cancer Brother        Lung cancer   Colon cancer Neg Hx    Esophageal cancer Neg Hx    Stomach cancer Neg Hx    Rectal cancer Neg Hx     Review of Systems:  As stated in the HPI and otherwise negative.   There were no vitals taken for this visit.  Physical Examination:  General: Well developed, well nourished, NAD  HEENT: OP clear, mucus membranes moist  SKIN: warm, dry. No rashes. Neuro: No focal deficits  Musculoskeletal: Muscle strength 5/5 all ext  Psychiatric: Mood and affect normal  Neck: No JVD, no carotid bruits, no thyromegaly, no lymphadenopathy.  Lungs:Clear bilaterally, no wheezes, rhonci, crackles Cardiovascular: Regular rate and rhythm. No murmurs, gallops or rubs. Abdomen:Soft. Bowel sounds present. Non-tender.  Extremities: No lower extremity edema. Pulses are 2 + in the bilateral DP/PT.  Echo September 2021: 1. Left ventricular ejection fraction, by estimation, is 55 to 60%. The  left ventricle has normal function. The left ventricle has no regional  wall motion abnormalities. Left ventricular diastolic function could not  be evaluated.   2. Right  ventricular systolic function is normal. The right ventricular  size is normal. Tricuspid regurgitation signal is inadequate for assessing  PA pressure.   3. Left atrial size was mild to moderately dilated.   4. The mitral valve is degenerative. Mild to moderate mitral valve  regurgitation. No evidence of mitral stenosis.   5. The aortic valve is tricuspid. There is moderate calcification of the  aortic valve. There is moderate thickening of the aortic valve. Aortic  valve regurgitation is not visualized. Mild aortic valve stenosis. Aortic  valve area, by VTI measures 1.79  cm. Aortic valve mean gradient measures 9.2 mmHg. Aortic valve Vmax  measures 2.18 m/s.   6. The inferior vena cava is normal in size with <50% respiratory  variability, suggesting right atrial pressure of 8 mmHg.   EKG:  EKG is *** ordered today. The ekg ordered today demonstrates   Recent Labs: 05/24/2022: ALT 22; BUN 45; Creatinine, Ser 1.70; Hemoglobin 13.5; Platelets 255.0; Potassium 5.0; Sodium 140; TSH 1.64   Lipid Panel    Component Value Date/Time   CHOL 157 05/24/2022 0918   TRIG 246.0 (H) 05/24/2022 0918   HDL 42.00 05/24/2022 0918   CHOLHDL 4 05/24/2022 0918   VLDL 49.2 (H) 05/24/2022 0918   LDLCALC 53 05/24/2021 0852   LDLDIRECT 64.0 05/24/2022 0918     Wt Readings from Last 3 Encounters:  05/24/22 111.1 kg  03/26/22 96.6 kg  01/11/22 96.8 kg    Assessment and Plan:   1. Persistent atrial fibrillation:  Rate controlled atrial fib today. Continue Eliquis.       2. Essential hypertension:  BP is well controlled. No changes today  3. HLD: Lipids followed in primary care. LDL ***. Continue statin.   4. Peripheral vascular disease/carotid disease, AAA:  Followed in [facility]  5. Mitral regurgitation: mild to moderate by echo in 2021.  6. Aortic stenosis: Mild by echo in 2021. Repeat echo now ***   Labs/ tests ordered today include:  No orders of the defined types were placed in this  encounter.   Disposition:   FU with me in 12 months  Signed, Lauree Chandler, MD 06/09/2022 6:10 PM    [facility] [address], [place], [place]  [address] Phone: [phone]; Fax: [phone]

## 2022-06-10 ENCOUNTER — Encounter: Payer: Self-pay | Admitting: Cardiovascular Disease

## 2022-06-10 ENCOUNTER — Ambulatory Visit: Payer: Medicare Other | Attending: Cardiovascular Disease | Admitting: Cardiovascular Disease

## 2022-06-10 ENCOUNTER — Other Ambulatory Visit: Payer: Self-pay

## 2022-06-10 VITALS — BP 152/70 | HR 63 | Ht 66.0 in | Wt 251.4 lb

## 2022-06-10 DIAGNOSIS — E7849 Other hyperlipidemia: Secondary | ICD-10-CM

## 2022-06-10 DIAGNOSIS — I6523 Occlusion and stenosis of bilateral carotid arteries: Secondary | ICD-10-CM

## 2022-06-10 DIAGNOSIS — I059 Rheumatic mitral valve disease, unspecified: Secondary | ICD-10-CM | POA: Diagnosis not present

## 2022-06-10 DIAGNOSIS — I10 Essential (primary) hypertension: Secondary | ICD-10-CM

## 2022-06-10 DIAGNOSIS — I4819 Other persistent atrial fibrillation: Secondary | ICD-10-CM

## 2022-06-10 DIAGNOSIS — I350 Nonrheumatic aortic (valve) stenosis: Secondary | ICD-10-CM | POA: Diagnosis not present

## 2022-06-10 MED ORDER — APIXABAN 5 MG PO TABS: 5.0000 mg | ORAL_TABLET | Freq: Two times a day (BID) | ORAL | 3 refills | Status: DC

## 2022-06-10 NOTE — Patient Instructions (Signed)
Medication Instructions:  No changes *If you need a refill on your cardiac medications before your next appointment, please call your pharmacy*   Lab Work: none   Testing/Procedures: Your physician has requested that you have an echocardiogram. Echocardiography is a painless test that uses sound waves to create images of your heart. It provides your doctor with information about the size and shape of your heart and how well your heart's chambers and valves are working. This procedure takes approximately one hour. There are no restrictions for this procedure. Please do NOT wear cologne, perfume, aftershave, or lotions (deodorant is allowed). Please arrive 15 minutes prior to your appointment time.    Follow-Up: At [facility], you and your health needs are our priority.  As part of our continuing mission to provide you with exceptional heart care, we have created designated Provider Care Teams.  These Care Teams include your primary Cardiologist (physician) and Advanced Practice Providers (APPs -  Physician Assistants and Nurse Practitioners) who all work together to provide you with the care you need, when you need it.    Your next appointment:   12 month(s)  Provider:   Christopher McAlhany, MD     

## 2022-07-01 ENCOUNTER — Other Ambulatory Visit: Payer: Self-pay | Admitting: Internal Medicine

## 2022-07-01 NOTE — Telephone Encounter (Signed)
Please refill as per office routine med refill policy (all routine meds to be refilled for 3 mo or monthly (per pt preference) up to one year from last visit, then month to month grace period for 3 mo, then further med refills will have to be denied) ? ?

## 2022-07-09 ENCOUNTER — Telehealth: Payer: Self-pay

## 2022-07-09 NOTE — Telephone Encounter (Signed)
Pt called regarding his RLE bleeding varicosity that started to bleed again as he was getting into shower. He had sclero last September on this area and states it did great and looked flatter for some time, until more recently. I have sent clinicals to his insurance company and will f/u with him once I hear back from them. No further questions at this time.

## 2022-07-20 IMAGING — CT CT CERVICAL SPINE W/O CM
3 series · 16 of 33 positions shown, 19 images · non-contrast
Comparison: None.

CLINICAL DATA: Neck trauma (Age >= 65y)

Fall.  No loss of consciousness.
EXAM:
CT CERVICAL SPINE WITHOUT CONTRAST
TECHNIQUE: Multidetector CT imaging of the cervical spine was performed without
intravenous contrast. Multiplanar CT image reconstructions were also
generated.

[Series 3: c_spine 2.0 st · axial · 0.36mm/px · z∈[+1038,+1166]mm · 8 of 76 slices shown, 10 images]
[im 6/76  soft-tissue]
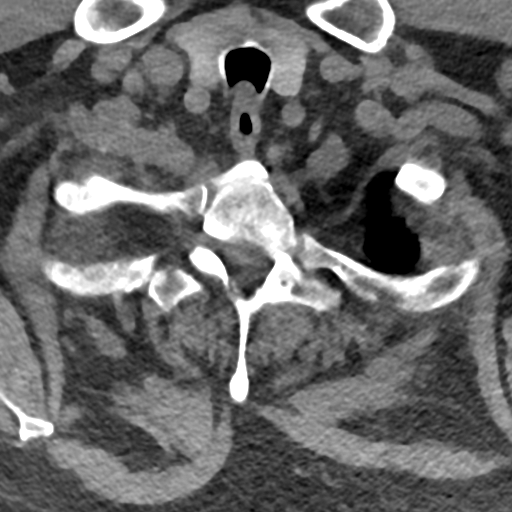
[im 6/76  bone]
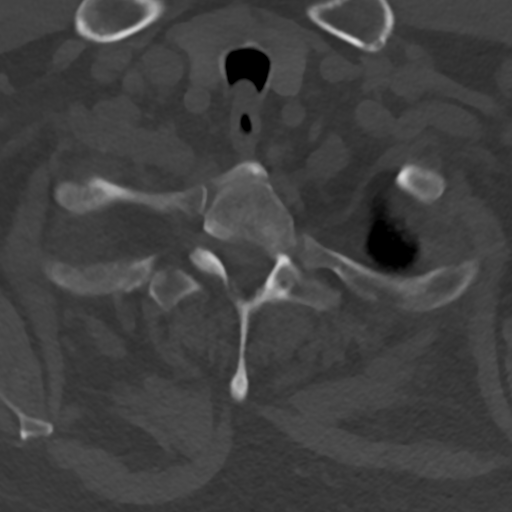
[im 18/76  bone]
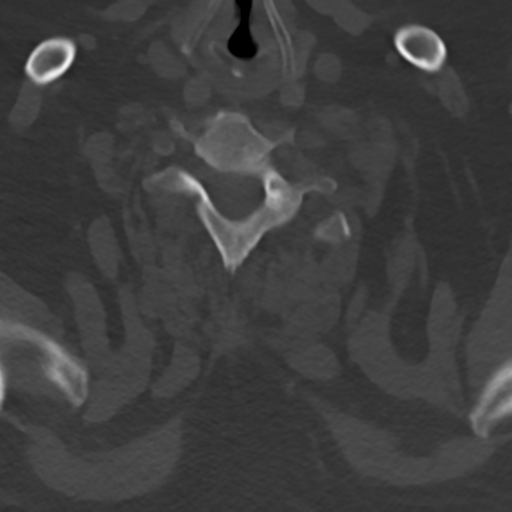
[im 24/76  bone]
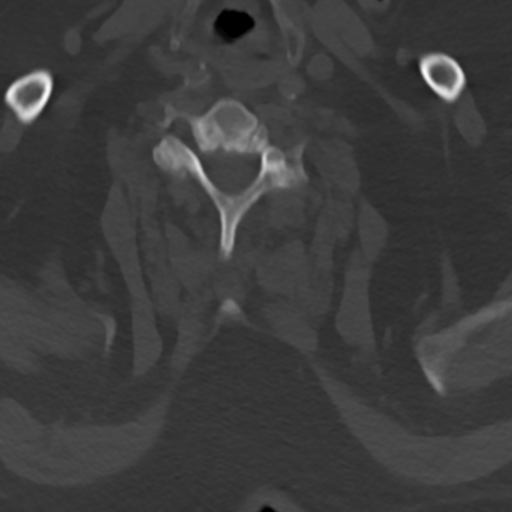
[im 35/76  bone]
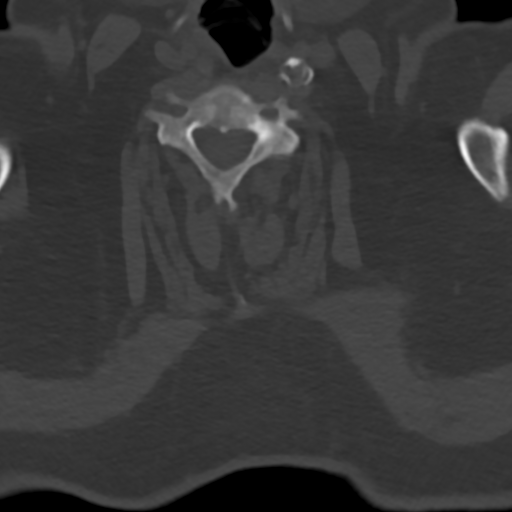
[im 41/76  soft-tissue]
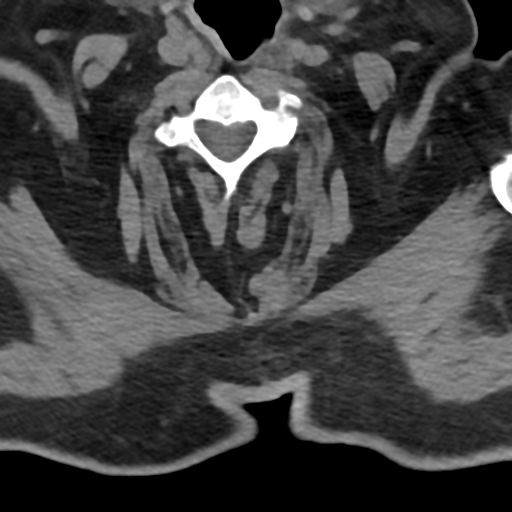
[im 41/76  bone]
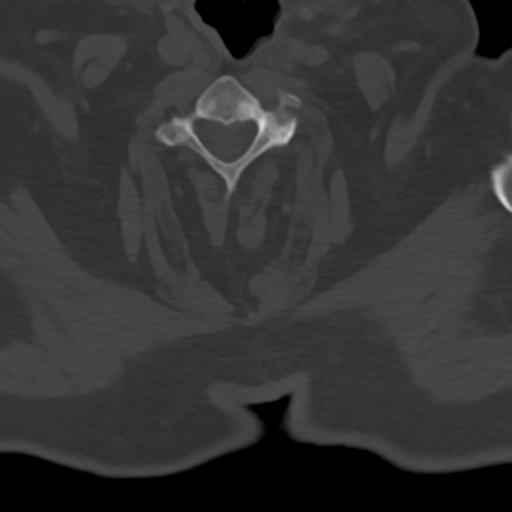
[im 52/76  bone]
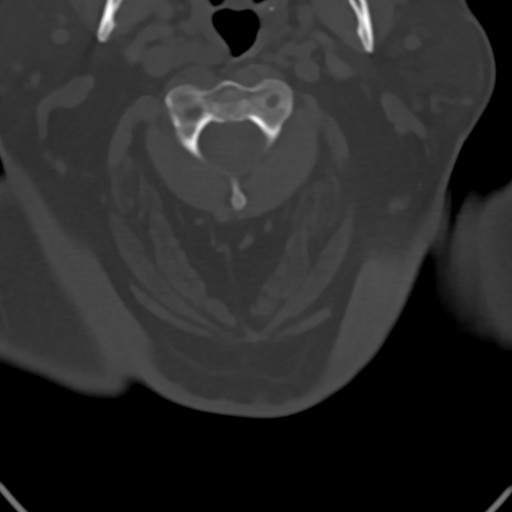
[im 58/76  bone]
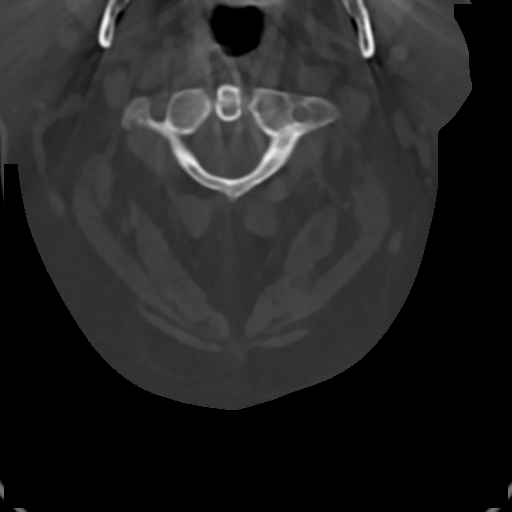
[im 70/76  bone]
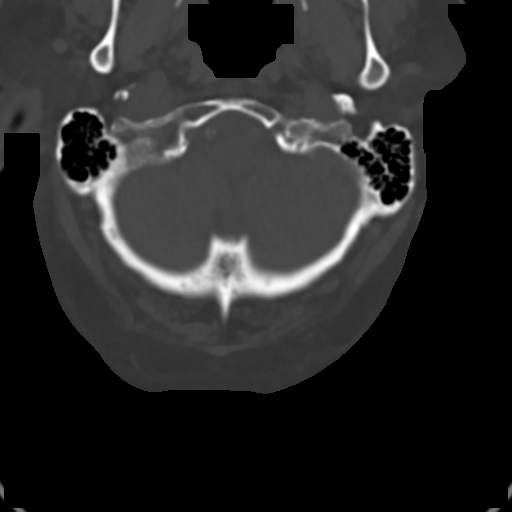

[Series 9: c_spine 2.0 sag bone · sagittal · 0.30mm/px · 5 of 83 slices shown, 6 images]
[im 28/83  bone]
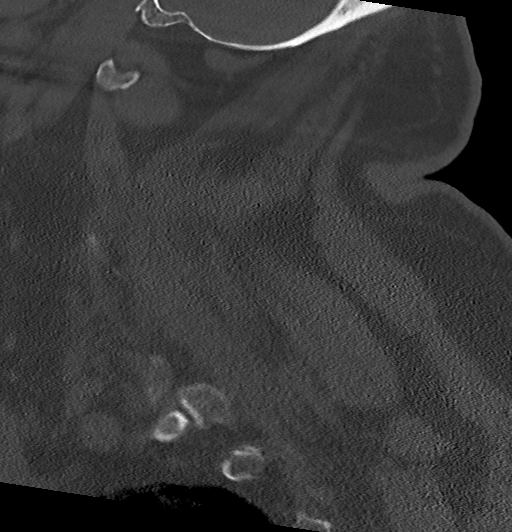
[im 35/83  bone]
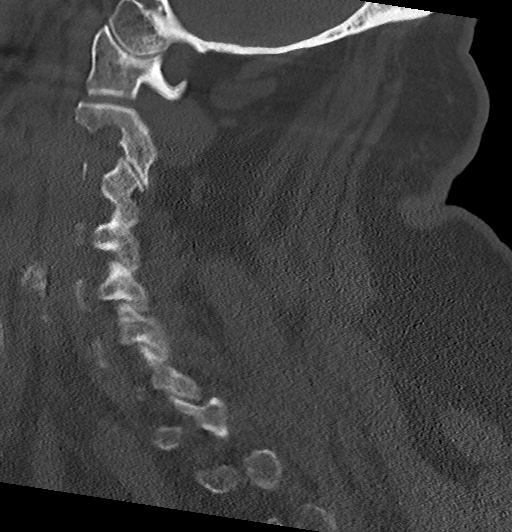
[im 42/83  soft-tissue]
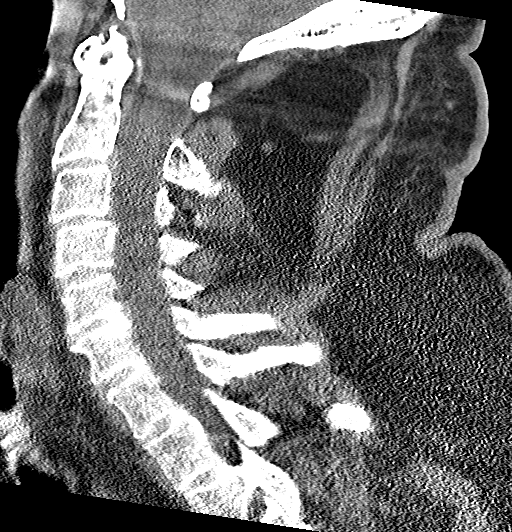
[im 42/83  bone]
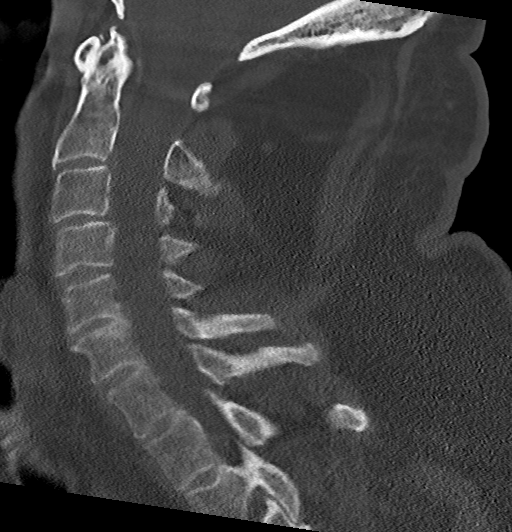
[im 48/83  bone]
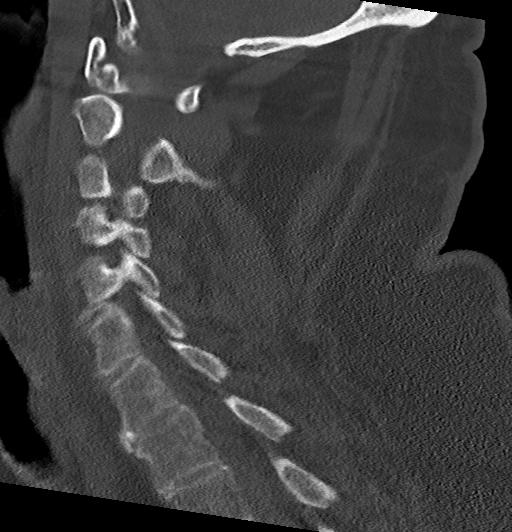
[im 55/83  bone]
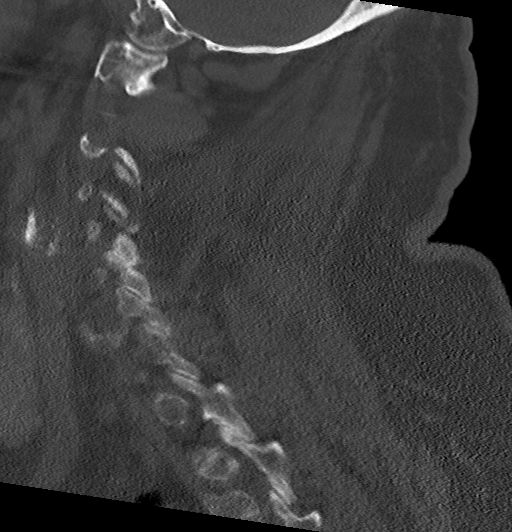

[Series 10: c_spine 2.0 cor bone · coronal · 0.31mm/px · 3 of 85 slices shown]
[im 17/85  bone]
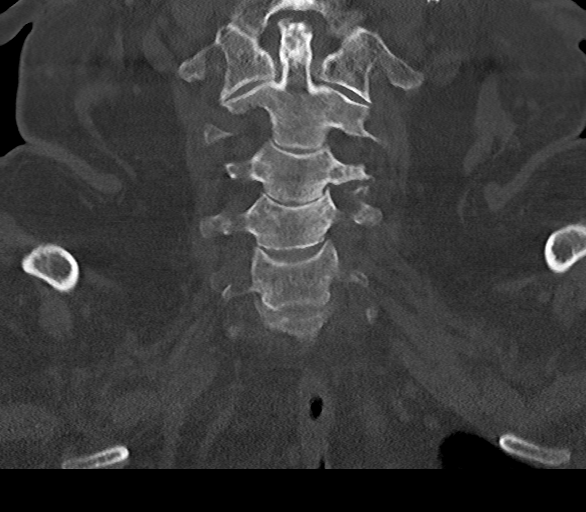
[im 34/85  bone]
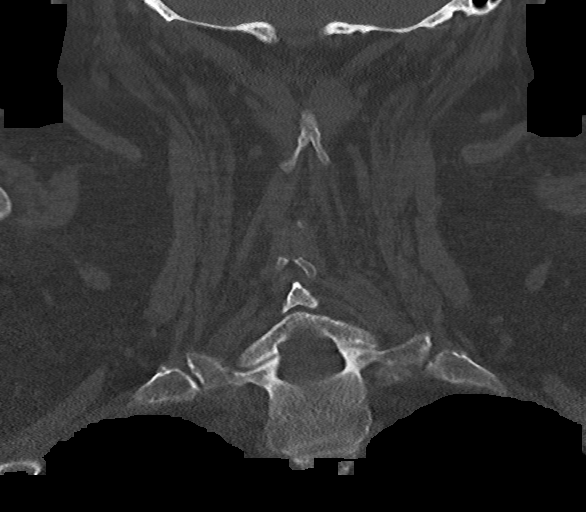
[im 51/85  bone]
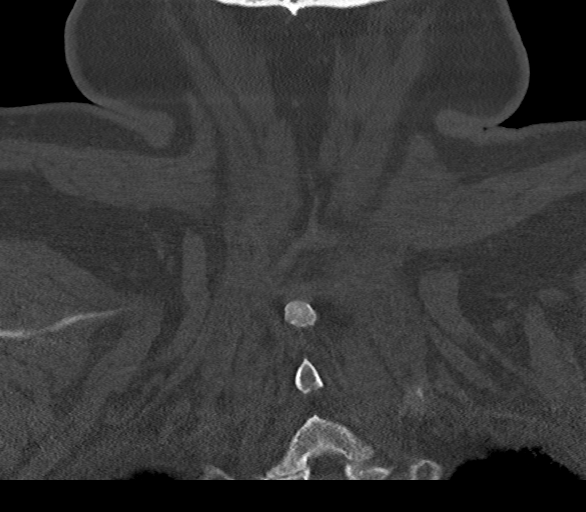

[16 of 33 positions shown; findings below may reference images not displayed]

FINDINGS: Alignment: 2 mm anterolisthesis of C3 on C4 likely facet mediated.
No traumatic subluxation.

Skull base and vertebrae: No acute fracture. Vertebral body heights
are maintained. The dens and skull base are intact. Moderate C1-C2
degenerative change.

Soft tissues and spinal canal: No prevertebral fluid or swelling. No
visible canal hematoma.

Disc levels: Multilevel degenerative disc disease. There is facet
hypertrophy most prominent at C3-C4 on the left. No high-grade canal
stenosis.

Upper chest: Nonacute

Other: Moderate carotid calcifications.
IMPRESSION: 1. No acute fracture or subluxation of the cervical spine.
2. Multilevel degenerative disc disease and facet hypertrophy.

## 2022-07-24 ENCOUNTER — Ambulatory Visit (HOSPITAL_COMMUNITY): Payer: Medicare Other | Attending: Cardiovascular Disease

## 2022-07-24 DIAGNOSIS — I350 Nonrheumatic aortic (valve) stenosis: Secondary | ICD-10-CM | POA: Insufficient documentation

## 2022-07-24 DIAGNOSIS — I059 Rheumatic mitral valve disease, unspecified: Secondary | ICD-10-CM | POA: Insufficient documentation

## 2022-07-24 LAB — ECHOCARDIOGRAM COMPLETE
AR max vel: 1.38 cm2
AV Area VTI: 1.6 cm2
AV Area mean vel: 1.5 cm2
AV Mean grad: 11.8 mmHg
AV Peak grad: 19.3 mmHg
Ao pk vel: 2.2 m/s
Area-P 1/2: 3.96 cm2
MV M vel: 4.28 m/s
MV Peak grad: 73.3 mmHg
Radius: 0.6 cm
S' Lateral: 3.1 cm

## 2022-07-29 ENCOUNTER — Telehealth: Payer: Self-pay

## 2022-07-29 NOTE — Telephone Encounter (Signed)
Called patient to schedule Medicare Annual Wellness Visit (AWV). Unable to reach patient.  Last date of AWV: eligible for AWV-I as of 05/06/10  Please schedule an appointment at any time with [facility].   Norton Blizzard, [place] (AAMA)  [facility] [phone]

## 2022-08-06 ENCOUNTER — Ambulatory Visit: Payer: Medicare Other

## 2022-08-06 DIAGNOSIS — I83899 Varicose veins of unspecified lower extremities with other complications: Secondary | ICD-10-CM

## 2022-08-06 NOTE — Progress Notes (Signed)
Treated pt's BLE small reticular veins with Asclera 1% administered with a 27g butterfly.  Patient received a total of 4 mL (40 mg) of Asclera 1%. Pt tolerated well. Easy access. Pt had rebleed on RLE in same area as last year. This area was retreated today, as well as other bulging reticular veins. Pt is aware if this area bleeds again, he is to f/u with MD for possible stab phlebectomy, per MD office visit note. Pt was given post treatment care instructions, both verbal and on handout. He will call if any questions/concerns arise.   Photos: Yes.    Compression stockings applied: Yes.

## 2022-08-12 DIAGNOSIS — R809 Proteinuria, unspecified: Secondary | ICD-10-CM | POA: Diagnosis not present

## 2022-08-12 DIAGNOSIS — E559 Vitamin D deficiency, unspecified: Secondary | ICD-10-CM | POA: Diagnosis not present

## 2022-08-12 DIAGNOSIS — N1831 Chronic kidney disease, stage 3a: Secondary | ICD-10-CM | POA: Diagnosis not present

## 2022-08-12 DIAGNOSIS — E1122 Type 2 diabetes mellitus with diabetic chronic kidney disease: Secondary | ICD-10-CM | POA: Diagnosis not present

## 2022-08-12 DIAGNOSIS — I129 Hypertensive chronic kidney disease with stage 1 through stage 4 chronic kidney disease, or unspecified chronic kidney disease: Secondary | ICD-10-CM | POA: Diagnosis not present

## 2022-08-19 ENCOUNTER — Other Ambulatory Visit: Payer: Self-pay | Admitting: Internal Medicine

## 2022-08-30 DIAGNOSIS — H5213 Myopia, bilateral: Secondary | ICD-10-CM | POA: Diagnosis not present

## 2022-08-30 LAB — HM DIABETES EYE EXAM

## 2022-10-07 ENCOUNTER — Other Ambulatory Visit (HOSPITAL_COMMUNITY): Payer: Self-pay

## 2022-10-29 DIAGNOSIS — H2511 Age-related nuclear cataract, right eye: Secondary | ICD-10-CM | POA: Diagnosis not present

## 2022-10-29 DIAGNOSIS — H25041 Posterior subcapsular polar age-related cataract, right eye: Secondary | ICD-10-CM | POA: Diagnosis not present

## 2022-10-29 DIAGNOSIS — H25811 Combined forms of age-related cataract, right eye: Secondary | ICD-10-CM | POA: Diagnosis not present

## 2022-11-12 DIAGNOSIS — H2512 Age-related nuclear cataract, left eye: Secondary | ICD-10-CM | POA: Diagnosis not present

## 2022-11-12 DIAGNOSIS — H25812 Combined forms of age-related cataract, left eye: Secondary | ICD-10-CM | POA: Diagnosis not present

## 2022-11-12 DIAGNOSIS — H25042 Posterior subcapsular polar age-related cataract, left eye: Secondary | ICD-10-CM | POA: Diagnosis not present

## 2022-11-18 ENCOUNTER — Other Ambulatory Visit: Payer: Self-pay | Admitting: Internal Medicine

## 2022-11-22 ENCOUNTER — Telehealth: Payer: Self-pay

## 2022-11-22 ENCOUNTER — Ambulatory Visit (INDEPENDENT_AMBULATORY_CARE_PROVIDER_SITE_OTHER): Payer: Medicare Other | Admitting: Internal Medicine

## 2022-11-22 VITALS — BP 134/70 | HR 45 | Temp 97.9°F | Ht 66.0 in | Wt 237.0 lb

## 2022-11-22 DIAGNOSIS — N183 Chronic kidney disease, stage 3 unspecified: Secondary | ICD-10-CM | POA: Diagnosis not present

## 2022-11-22 DIAGNOSIS — E559 Vitamin D deficiency, unspecified: Secondary | ICD-10-CM | POA: Diagnosis not present

## 2022-11-22 DIAGNOSIS — E538 Deficiency of other specified B group vitamins: Secondary | ICD-10-CM

## 2022-11-22 DIAGNOSIS — E7800 Pure hypercholesterolemia, unspecified: Secondary | ICD-10-CM | POA: Diagnosis not present

## 2022-11-22 DIAGNOSIS — Z125 Encounter for screening for malignant neoplasm of prostate: Secondary | ICD-10-CM

## 2022-11-22 DIAGNOSIS — E1121 Type 2 diabetes mellitus with diabetic nephropathy: Secondary | ICD-10-CM

## 2022-11-22 DIAGNOSIS — I10 Essential (primary) hypertension: Secondary | ICD-10-CM

## 2022-11-22 DIAGNOSIS — J309 Allergic rhinitis, unspecified: Secondary | ICD-10-CM | POA: Diagnosis not present

## 2022-11-22 NOTE — Patient Instructions (Signed)
Please continue all other medications as before, although you could ask at the pharmacy if jardiance is less expensive for you than the farxiga  Please have the pharmacy call with any other refills you may need.  Please continue your efforts at being more active, low cholesterol diet, and weight control.  You are otherwise up to date with prevention measures today.  Please keep your appointments with your specialists as you may have planned  Please go to the LAB at the blood drawing area for the tests to be done  You will be contacted by phone if any changes need to be made immediately.  Otherwise, you will receive a letter about your results with an explanation, but please check with MyChart first.  Please make an Appointment to return in 6 months, or sooner if needed, also with Lab Appointment for testing done 3-5 days before at the FIRST FLOOR Lab (so this is for TWO appointments - please see the scheduling desk as you leave)

## 2022-11-22 NOTE — Telephone Encounter (Signed)
Attempted to return pt's call regarding scheduling sclerotherapy. It sounds as if he is attempting to answer the call but then gets disconnected. Will try again next week.

## 2022-11-22 NOTE — Progress Notes (Unsigned)
Patient ID: David [name], male   DOB: 29-Nov-1950, 72 y.o.   MRN: [id number]        Chief Complaint: follow up HTN, HLD, dm, ckd3a, low vit d, low b12, allergies       HPI:  David [name] is a 72 y.o. male here overall doing ok.  Pt denies chest pain, increased sob or doe, wheezing, orthopnea, PND, increased LE swelling, palpitations, dizziness or syncope.   Pt denies polydipsia, polyuria, or new focal neuro s/s.    Pt denies fever, wt loss, night sweats, loss of appetite, or other constitutional symptoms  Does have several wks ongoing nasal allergy symptoms with clearish congestion, itch and sneezing, without fever, pain, ST, cough, swelling or wheezing.       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   Anticardiolipin antibody positive    Anxiety    ANXIETY 07/19/2008   CEREBROVASCULAR ACCIDENT, HX OF 07/19/2008   CVA (cerebral vascular accident) (HCC) 2003   Depression    DEPRESSION 07/19/2008   Diabetes mellitus    type II   DIABETES MELLITUS, TYPE II 01/18/2008   Diverticulosis    GERD 07/19/2008   GERD (gastroesophageal reflux disease)    Hemorrhoids    Hx of adenomatous colonic polyps    Hyperlipidemia    HYPERLIPIDEMIA 07/19/2008   Hypertension    HYPERTENSION 07/31/2009   Increased prostate specific antigen (PSA) velocity 09/13/2010   Internal hemorrhoids    Mitral regurgitation    Echo 9/21: EF 55-60, no RWMA, normal RVSF, mild-moderate LAE, mild-moderate MR, mild aortic stenosis (mean gradient 9.2 mmHg)   Perineal abscess    Peripheral vascular disease (HCC)    peripheral vascular disease/carotid artery <100%    PERSONAL HX COLONIC POLYPS 12/10/2007   Unspecified Peripheral Vascular Disease 01/18/2008   Varicose veins    Past Surgical History:  Procedure Laterality Date   7 abdominal hernia repairs  05/31/10   ABDOMINAL AORTAGRAM N/A 01/12/2013   Procedure: ABDOMINAL Ronny Flurry;  Surgeon: Nada Libman, MD;  Location: [facility] CATH LAB;  Service: Cardiovascular;  Laterality: N/A;   ABDOMINAL AORTIC ANEURYSM REPAIR     ABDOMINAL AORTOGRAM W/LOWER EXTREMITY Bilateral 10/05/2019   Procedure: ABDOMINAL AORTOGRAM W/LOWER EXTREMITY;  Surgeon: Nada Libman, MD;  Location: [facility] INVASIVE CV LAB;  Service: Cardiovascular;  Laterality: Bilateral;   COLONOSCOPY     hernia surgury     72 years old   perineal abscess     PERIPHERAL VASCULAR CATHETERIZATION N/A 11/23/2014   Procedure: Abdominal Aortogram;  Surgeon: Nada Libman, MD;  Location: [facility] INVASIVE CV LAB;  Service: Cardiovascular;  Laterality: N/A;   PERIPHERAL VASCULAR CATHETERIZATION  11/23/2014   Procedure: Lower Extremity Angiography;  Surgeon: Nada Libman, MD;  Location: [facility] INVASIVE CV LAB;  Service: Cardiovascular;;   PERIPHERAL VASCULAR CATHETERIZATION  11/23/2014   Procedure: Peripheral Vascular Intervention;  Surgeon: Nada Libman, MD;  Location[facility] INVASIVE CV LAB;  Service: Cardiovascular;;  PTA bilateral common iliac PTA/DCB left fem-pop   POLYPECTOMY  s/p right and left popliteal aneurysm repair     SP ENDO REPAIR INFRAREN AAA      reports that he quit smoking about 30 years ago. His smoking use included cigarettes. He has never used smokeless tobacco. He reports current alcohol use of about 2.0 standard drinks of alcohol per week. He reports that he does not use drugs. family history includes Aortic aneurysm in his mother; Cancer in his brother. Allergies  Allergen Reactions   Latex    Latex Rash   Current Outpatient Medications on File Prior to Visit  Medication Sig Dispense Refill   amLODipine (NORVASC) 10 MG tablet Take 1 tablet  (10 mg total) by mouth daily. 90 tablet 1   apixaban (ELIQUIS) 5 MG TABS tablet Take 1 tablet (5 mg total) by mouth 2 (two) times daily. 180 tablet 3   Ascorbic Acid (VITAMIN C) 1000 MG tablet Take 1,000 mg by mouth daily.     aspirin EC 81 MG tablet Take 81 mg by mouth daily. Swallow whole.     atorvastatin (LIPITOR) 40 MG tablet TAKE ONE TABLET BY MOUTH ONCE DAILY 90 tablet 2   cetirizine (ZYRTEC) 10 MG tablet Take 1 tablet (10 mg total) by mouth daily. 90 tablet 3   Cholecalciferol (VITAMIN D) 50 MCG (2000 UT) tablet Take 2,000 Units by mouth in the morning and at bedtime.     docusate sodium (COLACE) 100 MG capsule Take 100 mg by mouth 2 (two) times daily.     famotidine (PEPCID) 20 MG tablet Take 20 mg by mouth 2 (two) times daily.     FARXIGA 10 MG TABS tablet 10 mg daily.     folic acid (FOLVITE) 800 MCG tablet Take 800 mcg by mouth daily.     furosemide (LASIX) 40 MG tablet Take 40 mg by mouth daily.     gemfibrozil (LOPID) 600 MG tablet TAKE TWO TABLETS BY MOUTH ONCE DAILY 180 tablet 2   metFORMIN (GLUCOPHAGE-XR) 500 MG 24 hr tablet TAKE 4 TABLETS BY MOUTH EVERY MORNING 360 tablet 1   olmesartan (BENICAR) 40 MG tablet Take 1 tablet (40 mg total) by mouth daily. Follow-up appt due in August must see provider for future refills 90 tablet 0   Omega-3 Fatty Acids (FISH OIL) 1000 MG CAPS Take 1,000 mg by mouth daily.     psyllium (FIBER LAXATIVE) 0.52 g capsule Take 3.36 g by mouth daily with lunch.     triamcinolone (NASACORT) 55 MCG/ACT AERO nasal inhaler Place 2 sprays into the nose daily. (Patient taking differently: Place 1 spray into the nose daily as needed (allergies).) 1 Inhaler 12   metroNIDAZOLE (METROGEL) 0.75 % gel Apply 1 application  topically every other day.     Current Facility-Administered Medications on File Prior to Visit  Medication Dose Route Frequency Provider Last Rate Last Admin   0.9 %  sodium chloride infusion  500 mL Intravenous Continuous Meryl Dare, MD             ROS:  All others reviewed and negative.  Objective        PE:  BP 134/70 (BP Location: Left Arm, Patient Position: Sitting, Cuff Size: Normal)   Pulse (!) 45   Temp 97.9 F (36.6 C) (Oral)   Ht 5\' 6"  (1.676 m)   Wt 237 lb (107.5 kg)   SpO2 100%   BMI 38.25 kg/m  Constitutional: Pt appears in NAD               HENT: Head: NCAT.                Right Ear: External ear normal.                 Left Ear: External ear normal.                Eyes: . Pupils are equal, round, and reactive to light. Conjunctivae and EOM are normal               Nose: without d/c or deformity               Neck: Neck supple. Gross normal ROM               Cardiovascular: Normal rate and regular rhythm.                 Pulmonary/Chest: Effort normal and breath sounds without rales or wheezing.                Abd:  Soft, NT, ND, + BS, no organomegaly               Neurological: Pt is alert. At baseline orientation, motor grossly intact               Skin: Skin is warm. No rashes, no other new lesions, LE edema - none               Psychiatric: Pt behavior is normal without agitation   Micro: none  Cardiac tracings I have personally interpreted today:  none  Pertinent Radiological findings (summarize): none   Lab Results  Component Value Date   WBC 6.0 05/24/2022   HGB 13.5 05/24/2022   HCT 39.0 05/24/2022   PLT 255.0 05/24/2022   GLUCOSE 154 (H) 11/22/2022   CHOL 136 11/22/2022   TRIG 193 (H) 11/22/2022   HDL 38 (L) 11/22/2022   LDLDIRECT 64.0 05/24/2022   LDLCALC 72 11/22/2022   ALT 26 11/22/2022   AST 36 (H) 11/22/2022   NA 138 11/22/2022   K 4.8 11/22/2022   CL 101 11/22/2022   CREATININE 1.56 (H) 11/22/2022   BUN 38 (H) 11/22/2022   CO2 25 11/22/2022   TSH 1.64 05/24/2022   PSA 1.62 05/24/2022   INR 1.17 10/16/2017   HGBA1C 6.4 (H) 11/22/2022   MICROALBUR 173.4 (H) 11/12/2016   Assessment/Plan:  David [name] is a 72 y.o. 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) (09/07/2008), Anticardiolipin antibody positive, Anxiety, ANXIETY (07/19/2008), CEREBROVASCULAR ACCIDENT, HX OF (07/19/2008), CVA (cerebral vascular accident) (HCC) (2003), Depression, DEPRESSION (07/19/2008), Diabetes mellitus, DIABETES MELLITUS, TYPE II (01/18/2008), Diverticulosis, GERD (07/19/2008), GERD (gastroesophageal reflux disease), Hemorrhoids, adenomatous colonic polyps, Hyperlipidemia, HYPERLIPIDEMIA (07/19/2008), Hypertension, HYPERTENSION (07/31/2009), Increased prostate specific antigen (PSA) velocity (09/13/2010), Internal hemorrhoids, Mitral regurgitation, Perineal abscess, Peripheral vascular disease (HCC), PERSONAL HX COLONIC POLYPS (12/10/2007), Unspecified Peripheral Vascular Disease (01/18/2008), and Varicose veins.  B12 deficiency Lab Results  Component Value Date   VITAMINB12 805 05/24/2022   Stable, cont oral replacement - b12 1000 mcg qd   CKD (chronic kidney disease) Lab Results  Component Value Date   CREATININE 1.56 (H) 11/22/2022   Stable overall, cont to avoid nephrotoxins   Diabetes (HCC) Lab Results  Component Value Date   HGBA1C 6.4 (H) 11/22/2022   Stable, pt to continue current medical treatment farxiga 10 every day, metformin ER 500 mg - 4 qd   Essential hypertension BP Readings from Last 3 Encounters:  11/22/22 134/70  06/10/22 (!) 152/70  05/24/22 (!) 140/60   Stable, pt to continue medical treatment norvasc 10 every day, benicar 40 qd   HLD (hyperlipidemia) Lab Results  Component Value Date   LDLCALC 72 11/22/2022   Uncontrolled, goal ldl < 70, , pt to continue current statin lipitor 40 every day, lopid 600 bid as declines other change today   Vitamin D deficiency Last vitamin D Lab Results  Component Value Date   VD25OH 48 11/22/2022   Stable, cont oral replacement   Allergic rhinitis Mild  to mod, for restart zyrtec 10 every day and nasacort asd prn,,  to f/u any worsening symptoms or concerns Followup: Return in about 6 months (around 05/25/2023).  Oliver Barre, MD 11/24/2022 6:40 AM [facility]

## 2022-11-23 LAB — HEPATIC FUNCTION PANEL
AG Ratio: 1.7 (calc) (ref 1.0–2.5)
ALT: 26 U/L (ref 9–46)
AST: 36 U/L — ABNORMAL HIGH (ref 10–35)
Albumin: 4.8 g/dL (ref 3.6–5.1)
Alkaline phosphatase (APISO): 39 U/L (ref 35–144)
Bilirubin, Direct: 0.1 mg/dL (ref 0.0–0.2)
Globulin: 2.9 g/dL (calc) (ref 1.9–3.7)
Indirect Bilirubin: 0.3 mg/dL (calc) (ref 0.2–1.2)
Total Bilirubin: 0.4 mg/dL (ref 0.2–1.2)
Total Protein: 7.7 g/dL (ref 6.1–8.1)

## 2022-11-23 LAB — HEMOGLOBIN A1C
Hgb A1c MFr Bld: 6.4 % of total Hgb — ABNORMAL HIGH (ref <5.7)
Mean Plasma Glucose: 137 mg/dL
eAG (mmol/L): 7.6 mmol/L

## 2022-11-23 LAB — BASIC METABOLIC PANEL
BUN/Creatinine Ratio: 24 (calc) — ABNORMAL HIGH (ref 6–22)
BUN: 38 mg/dL — ABNORMAL HIGH (ref 7–25)
CO2: 25 mmol/L (ref 20–32)
Calcium: 10.5 mg/dL — ABNORMAL HIGH (ref 8.6–10.3)
Chloride: 101 mmol/L (ref 98–110)
Creat: 1.56 mg/dL — ABNORMAL HIGH (ref 0.70–1.28)
Glucose, Bld: 154 mg/dL — ABNORMAL HIGH (ref 65–99)
Potassium: 4.8 mmol/L (ref 3.5–5.3)
Sodium: 138 mmol/L (ref 135–146)

## 2022-11-23 LAB — LIPID PANEL
Cholesterol: 136 mg/dL (ref <200)
HDL: 38 mg/dL — ABNORMAL LOW (ref >=40)
LDL Cholesterol (Calc): 72 mg/dL (calc)
Non-HDL Cholesterol (Calc): 98 mg/dL (calc) (ref <130)
Total CHOL/HDL Ratio: 3.6 (calc) (ref <5.0)
Triglycerides: 193 mg/dL — ABNORMAL HIGH (ref <150)

## 2022-11-23 LAB — VITAMIN D 25 HYDROXY (VIT D DEFICIENCY, FRACTURES): Vit D, 25-Hydroxy: 48 ng/mL (ref 30–100)

## 2022-11-23 NOTE — Progress Notes (Signed)
The test results show that your current treatment is OK, as the tests are stable.  Please continue the same plan.  There is no other need for change of treatment or further evaluation based on these results, at this time.  thanks 

## 2022-11-24 ENCOUNTER — Encounter: Payer: Self-pay | Admitting: Internal Medicine

## 2022-11-24 NOTE — Assessment & Plan Note (Signed)
BP Readings from Last 3 Encounters:  11/22/22 134/70  06/10/22 (!) 152/70  05/24/22 (!) 140/60   Stable, pt to continue medical treatment norvasc 10 every day, benicar 40 qd

## 2022-11-24 NOTE — Assessment & Plan Note (Signed)
Lab Results  Component Value Date   HGBA1C 6.4 (H) 11/22/2022   Stable, pt to continue current medical treatment farxiga 10 every day, metformin ER 500 mg - 4 qd

## 2022-11-24 NOTE — Addendum Note (Signed)
Addended by: Corwin Levins on: 11/24/2022 06:42 AM   Modules accepted: Orders

## 2022-11-24 NOTE — Assessment & Plan Note (Signed)
Lab Results  Component Value Date   LDLCALC 72 11/22/2022   Uncontrolled, goal ldl < 70, , pt to continue current statin lipitor 40 every day, lopid 600 bid as declines other change today

## 2022-11-24 NOTE — Assessment & Plan Note (Signed)
Lab Results  Component Value Date   CREATININE 1.56 (H) 11/22/2022   Stable overall, cont to avoid nephrotoxins

## 2022-11-24 NOTE — Assessment & Plan Note (Signed)
Last vitamin D Lab Results  Component Value Date   VD25OH 48 11/22/2022   Stable, cont oral replacement

## 2022-11-24 NOTE — Assessment & Plan Note (Signed)
Lab Results  Component Value Date   VITAMINB12 805 05/24/2022   Stable, cont oral replacement - b12 1000 mcg qd

## 2022-11-24 NOTE — Assessment & Plan Note (Signed)
Mild to mod, for restart zyrtec 10 every day and nasacort asd prn,,  to f/u any worsening symptoms or concerns

## 2022-11-28 ENCOUNTER — Ambulatory Visit: Payer: Medicare Other | Admitting: Podiatry

## 2022-12-02 ENCOUNTER — Ambulatory Visit: Payer: Medicare Other | Admitting: Podiatry

## 2022-12-02 ENCOUNTER — Encounter: Payer: Self-pay | Admitting: Podiatry

## 2022-12-02 DIAGNOSIS — E1121 Type 2 diabetes mellitus with diabetic nephropathy: Secondary | ICD-10-CM

## 2022-12-02 DIAGNOSIS — L84 Corns and callosities: Secondary | ICD-10-CM

## 2022-12-02 DIAGNOSIS — B351 Tinea unguium: Secondary | ICD-10-CM | POA: Diagnosis not present

## 2022-12-02 DIAGNOSIS — M79675 Pain in left toe(s): Secondary | ICD-10-CM | POA: Diagnosis not present

## 2022-12-02 DIAGNOSIS — M79674 Pain in right toe(s): Secondary | ICD-10-CM

## 2022-12-02 NOTE — Progress Notes (Signed)
  Subjective:  Patient ID: David [name], male    DOB: May 14, 1950,   MRN: [id number]  No chief complaint on file.   72 y.o. male presents for concern of thickened elongated and painful nails that are difficult to trim. Requesting to have them trimmed today. Relates burning and tingling in their feet. Patient is diabetic and last A1c was  Lab Results  Component Value Date   HGBA1C 6.4 (H) 11/22/2022   .   PCP:  Corwin Levins, MD    . Denies any other pedal complaints. Denies n/v/f/c.   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   Anticardiolipin antibody positive    Anxiety    ANXIETY 07/19/2008   CEREBROVASCULAR ACCIDENT, HX OF 07/19/2008   CVA (cerebral vascular accident) (HCC) 2003   Depression    DEPRESSION 07/19/2008   Diabetes mellitus    type II   DIABETES MELLITUS, TYPE II 01/18/2008   Diverticulosis    GERD 07/19/2008   GERD (gastroesophageal reflux disease)    Hemorrhoids    Hx of adenomatous colonic polyps    Hyperlipidemia    HYPERLIPIDEMIA 07/19/2008   Hypertension    HYPERTENSION 07/31/2009   Increased prostate specific antigen (PSA) velocity 09/13/2010   Internal hemorrhoids    Mitral regurgitation    Echo 9/21: EF 55-60, no RWMA, normal RVSF, mild-moderate LAE, mild-moderate MR, mild aortic stenosis (mean gradient 9.2 mmHg)   Perineal abscess    Peripheral vascular disease (HCC)    peripheral vascular disease/carotid artery <100%   PERSONAL HX COLONIC POLYPS 12/10/2007   Unspecified Peripheral Vascular Disease 01/18/2008   Varicose veins     Objective:  Physical Exam: Vascular: DP/PT pulses 2/4 bilateral. CFT <3 seconds. Absent hair growth on digits. Edema noted to bilateral lower extremities. Xerosis noted bilaterally.  Skin. No lacerations or abrasions bilateral feet. Nails 1-5 bilateral  are thickened discolored and elongated with  subungual debris. Hyperkeratotic lesion noted sub fifth metatarsal base on left and sub first metatarsal head on right  Musculoskeletal: MMT 5/5 bilateral lower extremities in DF, PF, Inversion and Eversion. Deceased ROM in DF of ankle joint.  Neurological: Sensation intact to light touch. Protective sensation diminished bilateral.     Assessment:   1. Type 2 diabetes mellitus with diabetic nephropathy, without long-term current use of insulin (HCC)   2. Pain due to onychomycosis of toenails of both feet      Plan:  Patient was evaluated and treated and all questions answered. -Discussed and educated patient on diabetic foot care, especially with  regards to the vascular, neurological and musculoskeletal systems.  -Stressed the importance of good glycemic control and the detriment of not  controlling glucose levels in relation to the foot. -Discussed supportive shoes at all times and checking feet regularly.  -Mechanically debrided all nails 1-5 bilateral using sterile nail nipper and filed with dremel without incident  -Hyperkeratotic tissue debrided without incident with chisel x 2  -Answered all patient questions -Patient to return  in 3 months for at risk foot care -Patient advised to call the office if any problems or questions arise in the meantime.   Louann Sjogren, DPM

## 2022-12-03 ENCOUNTER — Telehealth: Payer: Self-pay | Admitting: Cardiovascular Disease

## 2022-12-03 NOTE — Telephone Encounter (Signed)
   Pre-operative Risk Assessment    Patient Name: David [name]  DOB: 1950/06/01 MRN: [id number]      Request for Surgical Clearance    Procedure:   Scler Therapy injection   Date of Surgery:  Clearance 12/13/22                                 Surgeon:  Dr. Myra Gianotti Surgeon's Group or Practice Name:  [facility] Phone number:  [phone] Fax number:  [phone]   Type of Clearance Requested:   - Medical  - Pharmacy:  Hold Apixaban (Eliquis) 2 days   Type of Anesthesia:  None    Additional requests/questions:      Melissa Noon   12/03/2022, 2:36 PM

## 2022-12-03 NOTE — Telephone Encounter (Addendum)
   Name: David [name]  DOB: [birth year]  MRN: [id number]  Primary Cardiologist: Verne Carrow, MD   Preoperative team, please contact this patient and set up a phone call appointment for further preoperative risk assessment. Please obtain consent and complete medication review. Thank you for your help.  I confirm that guidance regarding antiplatelet and oral anticoagulation therapy has been completed and, if necessary, noted below.   Procedure should be low bleed risk. Would see if 1 day Eliquis hold is acceptable prior to procedure - prefer to minimize time off anticoag given elevated CV risk including prior stroke.      Napoleon Form, Leodis Rains, NP 12/03/2022, 4:43 PM [facility]

## 2022-12-03 NOTE — Telephone Encounter (Addendum)
Patient with diagnosis of afib on Eliquis for anticoagulation.    Procedure: scler therapy injection Date of procedure: 12/13/22  CHA2DS2-VASc Score = 6  This indicates a 9.7% annual risk of stroke. The patient's score is based upon: CHF History: 0 HTN History: 1 Diabetes History: 1 Stroke History: 2 Vascular Disease History: 1 Age Score: 1 Gender Score: 0   CrCl 70mL/min using adjusted body weight Platelet count 255K  Procedure should be low bleed risk. Would see if 1 day Eliquis hold is acceptable prior to procedure - prefer to minimize time off anticoag given elevated CV risk including prior stroke.  **This guidance is not considered finalized until pre-operative APP has relayed final recommendations.**

## 2022-12-03 NOTE — Telephone Encounter (Signed)
Pharmacy please advise on holding Eliquis prior to Scler therapy injection scheduled for 12/13/2022. Thank you.

## 2022-12-04 NOTE — Telephone Encounter (Signed)
Left message for surgery scheduler to call back in regard to Eliquis hold time. Need to confirm if Dr. Myra Gianotti will be acceptable to the 1 day hold of Eliquis per the recommendation from the pre op Pharm-D.

## 2022-12-04 NOTE — Telephone Encounter (Signed)
I tried to call the pt to set up tele but VM is not set up, could not leave message.

## 2022-12-05 ENCOUNTER — Telehealth: Payer: Self-pay | Admitting: *Deleted

## 2022-12-05 NOTE — Telephone Encounter (Signed)
Pt has been added to pre op tele appt 12/09/22 per preop APP yesterday Robin Searing, NP to add pt to Monday 12/09/22, due to proc date and med hold.   Med rec and consent are done.    Patient Consent for Virtual Visit        David [name] has provided verbal consent on 12/05/2022 for a virtual visit (video or telephone).   CONSENT FOR VIRTUAL VISIT FOR:  David [name]  By participating in this virtual visit I agree to the following:  I hereby voluntarily request, consent and authorize [facility] and its employed or contracted physicians, physician assistants, nurse practitioners or other licensed health care professionals (the Practitioner), to provide me with telemedicine health care services (the "Services") as deemed necessary by the treating Practitioner. I acknowledge and consent to receive the Services by the Practitioner via telemedicine. I understand that the telemedicine visit will involve communicating with the Practitioner through live audiovisual communication technology and the disclosure of certain medical information by electronic transmission. I acknowledge that I have been given the opportunity to request an in-person assessment or other available alternative prior to the telemedicine visit and am voluntarily participating in the telemedicine visit.  I understand that I have the right to withhold or withdraw my consent to the use of telemedicine in the course of my care at any time, without affecting my right to future care or treatment, and that the Practitioner or I may terminate the telemedicine visit at any time. I understand that I have the right to inspect all information obtained and/or recorded in the course of the telemedicine visit and may receive copies of available information for a reasonable fee.  I understand that some of the potential risks of receiving the Services via telemedicine include:  Delay or interruption in medical evaluation due to technological  equipment failure or disruption; Information transmitted may not be sufficient (e.g. poor resolution of images) to allow for appropriate medical decision making by the Practitioner; and/or  In rare instances, security protocols could fail, causing a breach of personal health information.  Furthermore, I acknowledge that it is my responsibility to provide information about my medical history, conditions and care that is complete and accurate to the best of my ability. I acknowledge that Practitioner's advice, recommendations, and/or decision may be based on factors not within their control, such as incomplete or inaccurate data provided by me or distortions of diagnostic images or specimens that may result from electronic transmissions. I understand that the practice of medicine is not an exact science and that Practitioner makes no warranties or guarantees regarding treatment outcomes. I acknowledge that a copy of this consent can be made available to me via my patient portal [facility] MyChart), or I can request a printed copy by calling the office of [facility].    I understand that my insurance will be billed for this visit.   I have read or had this consent read to me. I understand the contents of this consent, which adequately explains the benefits and risks of the Services being provided via telemedicine.  I have been provided ample opportunity to ask questions regarding this consent and the Services and have had my questions answered to my satisfaction. I give my informed consent for the services to be provided through the use of telemedicine in my medical care

## 2022-12-05 NOTE — Telephone Encounter (Signed)
I s/w the pt and he is agreeable to tele pre op appt add on for 12/09/22 per Robin Searing, NP. Med rec and consent are done.

## 2022-12-05 NOTE — Telephone Encounter (Signed)
Dr. Estanislado Spire office called back to ask what time tele pre op appt is on 12/09/22 as Dr. Myra Gianotti will be out of the office some on 12/09/22.   I left message the tele appt is at 3 pm on 12/09/22. Left message that maybe we could get a call by tomorrow to let us know if Dr. Myra Gianotti is ok with our recommendations for blood thinner or if he is going to need the 2 day hold.

## 2022-12-05 NOTE — Telephone Encounter (Signed)
I left a detailed message in regard to hold time for Eliquis. See previous notes. Asked for a call back to preop to confirm if Dr. Myra Gianotti is ok with Eliquis 1 day hold.

## 2022-12-09 ENCOUNTER — Ambulatory Visit: Payer: Medicare Other | Attending: Cardiovascular Disease | Admitting: Student

## 2022-12-09 DIAGNOSIS — Z01810 Encounter for preprocedural cardiovascular examination: Secondary | ICD-10-CM | POA: Diagnosis not present

## 2022-12-09 NOTE — Progress Notes (Signed)
Virtual Visit via Telephone Note   Because of David [name]'s co-morbid illnesses, he is at least at moderate risk for complications without adequate follow up.  This format is felt to be most appropriate for this patient at this time.  The patient did not have access to video technology/had technical difficulties with video requiring transitioning to audio format only (telephone).  All issues noted in this document were discussed and addressed.  No physical exam could be performed with this format.  Please refer to the patient's chart for his consent to telehealth for [facility].  Evaluation Performed:  Preoperative cardiovascular risk assessment _____________   Date:  12/09/2022   Patient ID:  David [name], DOB 09-15-1950, MRN [id number] Patient Location:  Home Provider location:   Office  Primary Care Provider:  Corwin Levins, MD Primary Cardiologist:  Verne Carrow, MD  Chief Complaint / Patient Profile   72 y.o. y/o male with a h/o mitral regurgitation/mild AS, PAF on anticoagulation, carotid stenosis, PVD, hypertension, hyperlipidemia, AAA s/p repair August 2016, CKD, GERD, T2DM, CVA who is pending scler therapy injection by Dr. Myra Gianotti and presents today for telephonic preoperative cardiovascular risk assessment.  History of Present Illness    David [name] is a 72 y.o. male who presents via audio/video conferencing for a telehealth visit today.  Pt was last seen in cardiology clinic on 06/10/2022 by Dr. Clifton James.  At that time David [name] was stable from a cardiac standpoint.  The patient is now pending procedure as outlined above. Since his last visit, he is doing well. Patient denies shortness of breath or dyspnea on exertion. No chest pain, pressure, or tightness. Denies lower extremity edema, orthopnea, or PND. No palpitations. He has no cardiac awareness of afib. He walks for exercise at least 15 minutes most days of the week.   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   Anticardiolipin antibody positive    Anxiety    ANXIETY 07/19/2008   CEREBROVASCULAR ACCIDENT, HX OF 07/19/2008   CVA (cerebral vascular accident) (HCC) 2003   Depression    DEPRESSION 07/19/2008   Diabetes mellitus    type II   DIABETES MELLITUS, TYPE II 01/18/2008   Diverticulosis    GERD 07/19/2008   GERD (gastroesophageal reflux disease)    Hemorrhoids    Hx of adenomatous colonic polyps    Hyperlipidemia    HYPERLIPIDEMIA 07/19/2008   Hypertension    HYPERTENSION 07/31/2009   Increased prostate specific antigen (PSA) velocity 09/13/2010   Internal hemorrhoids    Mitral regurgitation    Echo 9/21: EF 55-60, no RWMA, normal RVSF, mild-moderate LAE, mild-moderate MR, mild aortic stenosis (mean gradient 9.2 mmHg)   Perineal abscess    Peripheral vascular disease (HCC)    peripheral vascular disease/carotid artery <100%   PERSONAL HX COLONIC POLYPS 12/10/2007   Unspecified Peripheral Vascular Disease 01/18/2008   Varicose veins    Past Surgical History:  Procedure Laterality Date   7 abdominal hernia repairs  05/31/10   ABDOMINAL AORTAGRAM N/A 01/12/2013   Procedure: ABDOMINAL Ronny Flurry;  Surgeon: Nada Libman, MD;  Location: [facility] CATH LAB;  Service: Cardiovascular;  Laterality: N/A;   ABDOMINAL AORTIC ANEURYSM REPAIR     ABDOMINAL AORTOGRAM W/LOWER EXTREMITY Bilateral 10/05/2019   Procedure: ABDOMINAL AORTOGRAM W/LOWER EXTREMITY;  Surgeon: Nada Libman, MD;  Location: [facility] INVASIVE CV LAB;  Service: Cardiovascular;  Laterality: Bilateral;   COLONOSCOPY     hernia surgury     72 years old   perineal abscess     PERIPHERAL VASCULAR CATHETERIZATION N/A 11/23/2014   Procedure: Abdominal Aortogram;  Surgeon: Nada Libman, MD;  Location: [facility] INVASIVE CV LAB;  Service: Cardiovascular;  Laterality: N/A;   PERIPHERAL  VASCULAR CATHETERIZATION  11/23/2014   Procedure: Lower Extremity Angiography;  Surgeon: Nada Libman, MD;  Location: [facility] INVASIVE CV LAB;  Service: Cardiovascular;;   PERIPHERAL VASCULAR CATHETERIZATION  11/23/2014   Procedure: Peripheral Vascular Intervention;  Surgeon: Nada Libman, MD;  Location: [facility] INVASIVE CV LAB;  Service: Cardiovascular;;  PTA bilateral common iliac PTA/DCB left fem-pop   POLYPECTOMY     s/p right and left popliteal aneurysm repair     SP ENDO REPAIR INFRAREN AAA      Allergies  Allergies  Allergen Reactions   Latex    Latex Rash    Home Medications    Prior to Admission medications   Medication Sig Start Date End Date Taking? Authorizing Provider  amLODipine (NORVASC) 10 MG tablet Take 1 tablet (10 mg total) by mouth daily. 11/18/22   Corwin Levins, MD  apixaban (ELIQUIS) 5 MG TABS tablet Take 1 tablet (5 mg total) by mouth 2 (two) times daily. 06/10/22   Kathleene Hazel, MD  Ascorbic Acid (VITAMIN C) 1000 MG tablet Take 1,000 mg by mouth daily.    [provider]  aspirin EC 81 MG tablet Take 81 mg by mouth daily. Swallow whole.    [provider]  atorvastatin (LIPITOR) 40 MG tablet TAKE ONE TABLET BY MOUTH ONCE DAILY 07/02/22   Corwin Levins, MD  cetirizine (ZYRTEC) 10 MG tablet Take 1 tablet (10 mg total) by mouth daily. 08/18/14   Corwin Levins, MD  Cholecalciferol (VITAMIN D) 50 MCG (2000 UT) tablet Take 2,000 Units by mouth in the morning and at bedtime.    [provider]  docusate sodium (COLACE) 100 MG capsule Take 100 mg by mouth 2 (two) times daily.    [provider]  famotidine (PEPCID) 20 MG tablet Take 20 mg by mouth 2 (two) times daily.    [provider]  FARXIGA 10 MG TABS tablet 10 mg daily. 04/20/22   [provider]  folic acid (FOLVITE) 800 MCG tablet Take 800 mcg by mouth daily.    [provider]  furosemide (LASIX) 40 MG tablet Take 40 mg by mouth daily.     [provider]  gemfibrozil (LOPID) 600 MG tablet TAKE TWO TABLETS BY MOUTH ONCE DAILY 07/02/22   Corwin Levins, MD  metFORMIN (GLUCOPHAGE-XR) 500 MG 24 hr tablet TAKE 4 TABLETS BY MOUTH EVERY MORNING 08/20/22   Corwin Levins, MD  olmesartan (BENICAR) 40 MG tablet Take 1 tablet (40 mg total) by mouth daily. Follow-up appt due in August must see provider for future refills 10/05/18   Corwin Levins, MD  Omega-3 Fatty Acids (FISH OIL) 1000 MG CAPS Take 1,000 mg by mouth daily.    [provider]  psyllium (FIBER LAXATIVE) 0.52 g capsule Take 3.36 g by mouth daily with lunch.    [provider]  triamcinolone (NASACORT) 55 MCG/ACT AERO nasal inhaler Place 2 sprays into the nose daily. Patient taking differently: Place 1 spray into the nose daily as needed (allergies). 06/18/17   Jonny Ruiz,  Len Blalock, MD    Physical Exam    Vital Signs:  DOYL PELLEGRINI does not have vital signs available for review today.  Given telephonic nature of communication, physical exam is limited. AAOx3. NAD. Normal affect.  Speech and respirations are unlabored.  Accessory Clinical Findings    None  Assessment & Plan    Primary Cardiologist: Verne Carrow, MD  Preoperative cardiovascular risk assessment.  Scler therapy injection by Dr. Myra Gianotti on 12/13/2022.  Chart reviewed as part of pre-operative protocol coverage. According to the RCRI, patient has a 0.9% risk of MACE. Patient reports activity equivalent to >4.0 METS (walks at least 15 minutes most days of the week).   Given past medical history and time since last visit, based on ACC/AHA guidelines, ANCLE MORELAN would be at acceptable risk for the planned procedure without further cardiovascular testing.   Patient was advised that if he develops new symptoms prior to surgery to contact our office to arrange a follow-up appointment.  he verbalized understanding.  Per Pharm.D., "Procedure should be low bleed risk. Would see if 1 day  Eliquis hold is acceptable prior to procedure - prefer to minimize time off anticoag given elevated CV risk including prior stroke."  I will route this recommendation to the requesting party via Epic fax function.  Please call with questions.  Time:   Today, I have spent 5 minutes with the patient with telehealth technology discussing medical history, symptoms, and management plan.     David Levering, NP  12/09/2022, 8:55 AM

## 2022-12-13 ENCOUNTER — Ambulatory Visit: Payer: Medicare Other

## 2022-12-13 DIAGNOSIS — I83899 Varicose veins of unspecified lower extremities with other complications: Secondary | ICD-10-CM | POA: Diagnosis not present

## 2022-12-13 NOTE — Progress Notes (Signed)
Treated pt's small reticular and spider veins on BLE that were protruding, with Asclera 1% administered with a 27g butterfly.  Patient received a total of 4 mL/40 mg. Easy access, pt tolerated well. Pt had good results from previous treatments and could benefit from further treatments, as he has a lot of reticular veins. Pt is going to return in about 6 months for a reflux study and to f/u with MD since it has been over a year since he's done that. Pt was given post treatment care instructions on hand out and verbally.    Photos: Yes.    Compression stockings applied: Yes.

## 2022-12-31 DIAGNOSIS — L570 Actinic keratosis: Secondary | ICD-10-CM | POA: Diagnosis not present

## 2022-12-31 DIAGNOSIS — L821 Other seborrheic keratosis: Secondary | ICD-10-CM | POA: Diagnosis not present

## 2022-12-31 DIAGNOSIS — D2262 Melanocytic nevi of left upper limb, including shoulder: Secondary | ICD-10-CM | POA: Diagnosis not present

## 2022-12-31 DIAGNOSIS — D692 Other nonthrombocytopenic purpura: Secondary | ICD-10-CM | POA: Diagnosis not present

## 2022-12-31 DIAGNOSIS — L02214 Cutaneous abscess of groin: Secondary | ICD-10-CM | POA: Diagnosis not present

## 2022-12-31 DIAGNOSIS — D225 Melanocytic nevi of trunk: Secondary | ICD-10-CM | POA: Diagnosis not present

## 2023-02-10 ENCOUNTER — Other Ambulatory Visit: Payer: Self-pay | Admitting: Internal Medicine

## 2023-02-11 ENCOUNTER — Other Ambulatory Visit: Payer: Self-pay

## 2023-02-12 ENCOUNTER — Other Ambulatory Visit (HOSPITAL_BASED_OUTPATIENT_CLINIC_OR_DEPARTMENT_OTHER): Payer: Self-pay

## 2023-02-12 MED ORDER — FLUAD 0.5 ML IM SUSY
PREFILLED_SYRINGE | INTRAMUSCULAR | 0 refills | Status: DC
  Filled 2023-02-12: qty 0.5, 1d supply, fill #0

## 2023-02-12 MED ORDER — COMIRNATY 30 MCG/0.3ML IM SUSY
PREFILLED_SYRINGE | INTRAMUSCULAR | 0 refills | Status: DC
  Filled 2023-02-12: qty 0.3, 1d supply, fill #0

## 2023-03-04 ENCOUNTER — Ambulatory Visit: Payer: Medicare Other | Admitting: Podiatry

## 2023-03-04 ENCOUNTER — Encounter: Payer: Self-pay | Admitting: Podiatry

## 2023-03-04 DIAGNOSIS — E1121 Type 2 diabetes mellitus with diabetic nephropathy: Secondary | ICD-10-CM | POA: Diagnosis not present

## 2023-03-04 DIAGNOSIS — B351 Tinea unguium: Secondary | ICD-10-CM

## 2023-03-04 DIAGNOSIS — L84 Corns and callosities: Secondary | ICD-10-CM | POA: Diagnosis not present

## 2023-03-04 DIAGNOSIS — M79674 Pain in right toe(s): Secondary | ICD-10-CM | POA: Diagnosis not present

## 2023-03-04 DIAGNOSIS — M79675 Pain in left toe(s): Secondary | ICD-10-CM

## 2023-03-04 NOTE — Progress Notes (Signed)
Subjective:  Patient ID: David [name], male    DOB: 1951-03-09,   MRN: [id number]  Chief Complaint  Patient presents with   [facility]    Rm#21 dfc nail trim    72 y.o. male presents for concern of thickened elongated and painful nails that are difficult to trim. Requesting to have them trimmed today. Relates burning and tingling in their feet. Patient is diabetic and last A1c was  Lab Results  Component Value Date   HGBA1C 6.4 (H) 11/22/2022   .   PCP:  Corwin Levins, MD    . Denies any other pedal complaints. Denies n/v/f/c.   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   Anticardiolipin antibody positive    Anxiety    ANXIETY 07/19/2008   CEREBROVASCULAR ACCIDENT, HX OF 07/19/2008   CVA (cerebral vascular accident) (HCC) 2003   Depression    DEPRESSION 07/19/2008   Diabetes mellitus    type II   DIABETES MELLITUS, TYPE II 01/18/2008   Diverticulosis    GERD 07/19/2008   GERD (gastroesophageal reflux disease)    Hemorrhoids    Hx of adenomatous colonic polyps    Hyperlipidemia    HYPERLIPIDEMIA 07/19/2008   Hypertension    HYPERTENSION 07/31/2009   Increased prostate specific antigen (PSA) velocity 09/13/2010   Internal hemorrhoids    Mitral regurgitation    Echo 9/21: EF 55-60, no RWMA, normal RVSF, mild-moderate LAE, mild-moderate MR, mild aortic stenosis (mean gradient 9.2 mmHg)   Perineal abscess    Peripheral vascular disease (HCC)    peripheral vascular disease/carotid artery <100%   PERSONAL HX COLONIC POLYPS 12/10/2007   Unspecified Peripheral Vascular Disease 01/18/2008   Varicose veins     Objective:  Physical Exam: Vascular: DP/PT pulses 2/4 bilateral. CFT <3 seconds. Absent hair growth on digits. Edema noted to bilateral lower extremities. Xerosis noted bilaterally.  Skin. No lacerations or abrasions bilateral feet. Nails 1-5 bilateral   are thickened discolored and elongated with subungual debris. Hyperkeratotic lesion noted sub fifth metatarsal base on left and sub first metatarsal head on right  Musculoskeletal: MMT 5/5 bilateral lower extremities in DF, PF, Inversion and Eversion. Deceased ROM in DF of ankle joint.  Neurological: Sensation intact to light touch. Protective sensation diminished bilateral.     Assessment:   1. Pain due to onychomycosis of toenails of both feet   2. Callus   3. Type 2 diabetes mellitus with diabetic nephropathy, without long-term current use of insulin (HCC)       Plan:  Patient was evaluated and treated and all questions answered. -Discussed and educated patient on diabetic foot care, especially with  regards to the vascular, neurological and musculoskeletal systems.  -Stressed the importance of good glycemic control and the detriment of not  controlling glucose levels in relation to the foot. -Discussed supportive shoes at all times and checking feet regularly.  -Mechanically debrided all nails 1-5 bilateral using sterile nail nipper and filed with dremel without incident  -Hyperkeratotic tissue debrided without incident with chisel x 2  -Answered all patient questions -Patient to return  in 3 months for at risk foot care -Patient advised to call the office if any problems or questions arise in the meantime.   Louann Sjogren, DPM

## 2023-03-14 DIAGNOSIS — N1831 Chronic kidney disease, stage 3a: Secondary | ICD-10-CM | POA: Diagnosis not present

## 2023-03-21 DIAGNOSIS — N1831 Chronic kidney disease, stage 3a: Secondary | ICD-10-CM | POA: Diagnosis not present

## 2023-03-21 DIAGNOSIS — E1122 Type 2 diabetes mellitus with diabetic chronic kidney disease: Secondary | ICD-10-CM | POA: Diagnosis not present

## 2023-03-21 DIAGNOSIS — I129 Hypertensive chronic kidney disease with stage 1 through stage 4 chronic kidney disease, or unspecified chronic kidney disease: Secondary | ICD-10-CM | POA: Diagnosis not present

## 2023-03-21 DIAGNOSIS — R809 Proteinuria, unspecified: Secondary | ICD-10-CM | POA: Diagnosis not present

## 2023-03-24 ENCOUNTER — Other Ambulatory Visit: Payer: Self-pay | Admitting: Internal Medicine

## 2023-03-24 ENCOUNTER — Other Ambulatory Visit: Payer: Self-pay

## 2023-05-05 ENCOUNTER — Other Ambulatory Visit: Payer: Self-pay | Admitting: Internal Medicine

## 2023-05-06 ENCOUNTER — Other Ambulatory Visit: Payer: Self-pay

## 2023-05-22 ENCOUNTER — Other Ambulatory Visit: Payer: Medicare Other

## 2023-05-27 ENCOUNTER — Ambulatory Visit: Payer: Medicare Other | Admitting: Internal Medicine

## 2023-05-27 ENCOUNTER — Encounter: Payer: Self-pay | Admitting: Internal Medicine

## 2023-05-27 VITALS — BP 134/82 | HR 59 | Temp 97.9°F | Ht 66.0 in | Wt 251.0 lb

## 2023-05-27 DIAGNOSIS — H6121 Impacted cerumen, right ear: Secondary | ICD-10-CM

## 2023-05-27 DIAGNOSIS — Z125 Encounter for screening for malignant neoplasm of prostate: Secondary | ICD-10-CM

## 2023-05-27 DIAGNOSIS — Z7984 Long term (current) use of oral hypoglycemic drugs: Secondary | ICD-10-CM

## 2023-05-27 DIAGNOSIS — Z0001 Encounter for general adult medical examination with abnormal findings: Secondary | ICD-10-CM | POA: Insufficient documentation

## 2023-05-27 DIAGNOSIS — E559 Vitamin D deficiency, unspecified: Secondary | ICD-10-CM

## 2023-05-27 DIAGNOSIS — E1121 Type 2 diabetes mellitus with diabetic nephropathy: Secondary | ICD-10-CM

## 2023-05-27 DIAGNOSIS — E538 Deficiency of other specified B group vitamins: Secondary | ICD-10-CM | POA: Diagnosis not present

## 2023-05-27 DIAGNOSIS — R972 Elevated prostate specific antigen [PSA]: Secondary | ICD-10-CM

## 2023-05-27 DIAGNOSIS — E7800 Pure hypercholesterolemia, unspecified: Secondary | ICD-10-CM

## 2023-05-27 DIAGNOSIS — I10 Essential (primary) hypertension: Secondary | ICD-10-CM

## 2023-05-27 DIAGNOSIS — N183 Chronic kidney disease, stage 3 unspecified: Secondary | ICD-10-CM

## 2023-05-27 LAB — LIPID PANEL
Cholesterol: 131 mg/dL (ref 0–200)
HDL: 40.5 mg/dL (ref >39.00)
LDL Cholesterol: 56 mg/dL (ref 0–99)
NonHDL: 90.88
Total CHOL/HDL Ratio: 3
Triglycerides: 176 mg/dL — ABNORMAL HIGH (ref 0.0–149.0)
VLDL: 35.2 mg/dL (ref 0.0–40.0)

## 2023-05-27 LAB — HEPATIC FUNCTION PANEL
ALT: 16 U/L (ref 0–53)
AST: 30 U/L (ref 0–37)
Albumin: 4.6 g/dL (ref 3.5–5.2)
Alkaline Phosphatase: 40 U/L (ref 39–117)
Bilirubin, Direct: 0.1 mg/dL (ref 0.0–0.3)
Total Bilirubin: 0.6 mg/dL (ref 0.2–1.2)
Total Protein: 7.9 g/dL (ref 6.0–8.3)

## 2023-05-27 LAB — CBC WITH DIFFERENTIAL/PLATELET
Basophils Absolute: 0.1 10*3/uL (ref 0.0–0.1)
Basophils Relative: 1.5 % (ref 0.0–3.0)
Eosinophils Absolute: 0.4 10*3/uL (ref 0.0–0.7)
Eosinophils Relative: 6.9 % — ABNORMAL HIGH (ref 0.0–5.0)
HCT: 35.5 % — ABNORMAL LOW (ref 39.0–52.0)
Hemoglobin: 11.9 g/dL — ABNORMAL LOW (ref 13.0–17.0)
Lymphocytes Relative: 12.6 % (ref 12.0–46.0)
Lymphs Abs: 0.8 10*3/uL (ref 0.7–4.0)
MCHC: 33.5 g/dL (ref 30.0–36.0)
MCV: 98.6 fL (ref 78.0–100.0)
Monocytes Absolute: 0.6 10*3/uL (ref 0.1–1.0)
Monocytes Relative: 10.6 % (ref 3.0–12.0)
Neutro Abs: 4.1 10*3/uL (ref 1.4–7.7)
Neutrophils Relative %: 68.4 % (ref 43.0–77.0)
Platelets: 219 10*3/uL (ref 150.0–400.0)
RBC: 3.6 Mil/uL — ABNORMAL LOW (ref 4.22–5.81)
RDW: 12.6 % (ref 11.5–15.5)
WBC: 6 10*3/uL (ref 4.0–10.5)

## 2023-05-27 LAB — BASIC METABOLIC PANEL
BUN: 31 mg/dL — ABNORMAL HIGH (ref 6–23)
CO2: 20 meq/L (ref 19–32)
Calcium: 10.1 mg/dL (ref 8.4–10.5)
Chloride: 105 meq/L (ref 96–112)
Creatinine, Ser: 1.57 mg/dL — ABNORMAL HIGH (ref 0.40–1.50)
GFR: 43.11 mL/min — ABNORMAL LOW (ref >60.00)
Glucose, Bld: 158 mg/dL — ABNORMAL HIGH (ref 70–99)
Potassium: 4.7 meq/L (ref 3.5–5.1)
Sodium: 140 meq/L (ref 135–145)

## 2023-05-27 LAB — VITAMIN B12: Vitamin B-12: 743 pg/mL (ref 211–911)

## 2023-05-27 LAB — PSA: PSA: 1.36 ng/mL (ref 0.10–4.00)

## 2023-05-27 LAB — TSH: TSH: 1.86 u[IU]/mL (ref 0.35–5.50)

## 2023-05-27 LAB — HEMOGLOBIN A1C: Hgb A1c MFr Bld: 6.4 % (ref 4.6–6.5)

## 2023-05-27 LAB — VITAMIN D 25 HYDROXY (VIT D DEFICIENCY, FRACTURES): VITD: 37.54 ng/mL (ref 30.00–100.00)

## 2023-05-27 NOTE — Patient Instructions (Addendum)
Please let me know if you would want the Cardiac CT score testing.    Ok to use the OTC Debrox to help remove the right ear wax if you need to in future  Please continue all other medications as before, and refills have been done if requested.  Please have the pharmacy call with any other refills you may need.  Please continue your efforts at being more active, low cholesterol diet, and weight control.  You are otherwise up to date with prevention measures today.  Please keep your appointments with your specialists as you may have planned  Please go to the LAB at the blood drawing area for the tests to be done  You will be contacted by phone if any changes need to be made immediately.  Otherwise, you will receive a letter about your results with an explanation, but please check with MyChart first.  Please make an Appointment to return in 6 months, or sooner if needed

## 2023-05-27 NOTE — Progress Notes (Signed)
The test results show that your current treatment is OK, as the tests are stable.  Please continue the same plan.  There is no other need for change of treatment or further evaluation based on these results, at this time.  thanks 

## 2023-05-27 NOTE — Progress Notes (Signed)
Patient ID: ARTIST BLOOM, male   DOB: June 11, 1950, 73 y.o.   MRN: [id number]         Chief Complaint:: wellness exam and right ear wax impaction, ckd3a, low b12, dm, htn, hld, elevated psa, low vit d       HPI:  David [name] is a 73 y.o. male here for wellness exam; o/w up to date                        Also sees Dr Ralene Cork podiatry for nail care, and Dr Dorethea Clan renal every 6 mo.   Last seen per vascular approx 1 yr ago Dr Myra Gianotti. [facility] Dr Clifton Benna Arno yearly, has Echo for next month.  No overt bleeding or bruising on eliquis. Last seen per neurology over 10 yrs ago.  S/p bilat cataract last yr. Pt denies chest pain, increased sob or doe, wheezing, orthopnea, PND, increased LE swelling, palpitations, dizziness or syncope.   Pt denies polydipsia, polyuria, or new focal neuro s/s.    Pt denies fever, wt loss, night sweats, loss of appetite, or other constitutional symptom   Wt Readings from Last 3 Encounters:  05/27/23 251 lb (113.9 kg)  11/22/22 237 lb (107.5 kg)  06/10/22 251 lb 6.4 oz (114 kg)   BP Readings from Last 3 Encounters:  05/27/23 134/82  11/22/22 134/70  06/10/22 (!) 152/70   Immunization History  Administered Date(s) Administered   Fluad Quad(high Dose 65+) 12/23/2018, 02/16/2020, 03/22/2021   Fluad Trivalent(High Dose 65+) 02/12/2023   Influenza, High Dose Seasonal PF 03/03/2018   Influenza-Unspecified 02/07/2022   PFIZER Comirnaty(Gray Top)Covid-19 Tri-Sucrose Vaccine 08/18/2020   PFIZER(Purple Top)SARS-COV-2 Vaccination 06/11/2019, 07/06/2019, 03/17/2020   Pfizer Covid-19 Vaccine Bivalent Booster 51yrs & up 04/12/2021   Pfizer(Comirnaty)Fall Seasonal Vaccine 12 years and older 02/12/2023   Pneumococcal Conjugate-13 08/18/2014   Pneumococcal Polysaccharide-23 07/19/2008, 07/21/2013, 01/13/2020   Td 07/19/2008   Tdap 12/23/2018   Zoster Recombinant(Shingrix) 03/30/2019, 05/17/2019   Zoster, Live 11/22/2011   Health Maintenance Due  Topic Date Due    Medicare Annual Wellness (AWV)  Never done   Diabetic kidney evaluation - Urine ACR  10/22/2018      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   Anticardiolipin antibody positive    Anxiety    ANXIETY 07/19/2008   CEREBROVASCULAR ACCIDENT, HX OF 07/19/2008   CVA (cerebral vascular accident) (HCC) 2003   Depression    DEPRESSION 07/19/2008   Diabetes mellitus    type II   DIABETES MELLITUS, TYPE II 01/18/2008   Diverticulosis    GERD 07/19/2008   GERD (gastroesophageal reflux disease)    Hemorrhoids    Hx of adenomatous colonic polyps    Hyperlipidemia    HYPERLIPIDEMIA 07/19/2008   Hypertension    HYPERTENSION 07/31/2009   Increased prostate specific antigen (PSA) velocity 09/13/2010   Internal hemorrhoids    Mitral regurgitation    Echo 9/21: EF 55-60, no RWMA, normal RVSF, mild-moderate LAE, mild-moderate MR, mild aortic stenosis (mean gradient 9.2 mmHg)   Perineal abscess    Peripheral vascular disease (HCC)    peripheral vascular disease/carotid artery <100%   PERSONAL HX COLONIC POLYPS 12/10/2007   Unspecified Peripheral Vascular Disease 01/18/2008   Varicose veins    Past Surgical History:  Procedure Laterality Date   7 abdominal hernia repairs  05/31/10   ABDOMINAL AORTAGRAM N/A 01/12/2013   Procedure: ABDOMINAL Ronny Flurry;  Surgeon: Nada Libman, MD;  Location: [facility] CATH LAB;  Service: Cardiovascular;  Laterality: N/A;   ABDOMINAL AORTIC ANEURYSM REPAIR     ABDOMINAL AORTOGRAM W/LOWER EXTREMITY Bilateral 10/05/2019   Procedure: ABDOMINAL AORTOGRAM W/LOWER EXTREMITY;  Surgeon: Nada Libman, MD;  Location: [facility] INVASIVE CV LAB;  Service: Cardiovascular;  Laterality: Bilateral;   COLONOSCOPY     hernia surgury     73 years old   perineal abscess     PERIPHERAL VASCULAR CATHETERIZATION N/A 11/23/2014   Procedure: Abdominal Aortogram;  Surgeon: Nada Libman, MD;  Location: [facility] INVASIVE CV LAB;  Service: Cardiovascular;  Laterality: N/A;   PERIPHERAL VASCULAR CATHETERIZATION  11/23/2014   Procedure: Lower Extremity Angiography;  Surgeon: Nada Libman, MD;  Location: [facility] INVASIVE CV LAB;  Service: Cardiovascular;;   PERIPHERAL VASCULAR CATHETERIZATION  11/23/2014   Procedure: Peripheral Vascular Intervention;  Surgeon: Nada Libman, MD;  Location: [facility] INVASIVE CV LAB;  Service: Cardiovascular;;  PTA bilateral common iliac PTA/DCB left fem-pop   POLYPECTOMY     s/p right and left popliteal aneurysm repair     SP ENDO REPAIR INFRAREN AAA      reports that he quit smoking about 31 years ago. His smoking use included cigarettes. He has never used smokeless tobacco. He reports current alcohol use of about 2.0 standard drinks of alcohol per week. He reports that he does not use drugs. family history includes Aortic aneurysm in his mother; Cancer in his brother. Allergies  Allergen Reactions   Latex    Latex Rash   Current Outpatient Medications on File Prior to Visit  Medication Sig Dispense Refill   amLODipine (NORVASC) 10 MG tablet Take 1 tablet (10 mg total) by mouth daily. 90 tablet 1   apixaban (ELIQUIS) 5 MG TABS tablet Take 1 tablet (5 mg total) by mouth 2 (two) times daily. 180 tablet 3   Ascorbic Acid (VITAMIN C) 1000 MG tablet Take 1,000 mg by mouth daily.     aspirin EC 81 MG tablet Take 81 mg by mouth daily. Swallow whole.     atorvastatin (LIPITOR) 40 MG tablet TAKE ONE TABLET BY MOUTH ONCE DAILY 90 tablet 2   cetirizine (ZYRTEC) 10 MG tablet Take 1 tablet (10 mg total) by mouth daily. 90 tablet 3   Cholecalciferol (VITAMIN D) 50 MCG (2000 UT) tablet Take 2,000 Units by mouth in the morning and at bedtime.     COVID-19 mRNA vaccine, Pfizer, (COMIRNATY) syringe Inject into the muscle. 0.3 mL 0   docusate sodium (COLACE) 100 MG capsule Take 100 mg by mouth 2 (two) times daily.     famotidine (PEPCID) 20 MG tablet Take 20 mg by  mouth 2 (two) times daily.     FARXIGA 10 MG TABS tablet 10 mg daily.     folic acid (FOLVITE) 800 MCG tablet Take 800 mcg by mouth daily.     furosemide (LASIX) 40 MG tablet Take 40 mg by mouth daily.     gemfibrozil (LOPID) 600 MG tablet TAKE TWO TABLETS BY MOUTH ONCE DAILY 180 tablet 2   influenza vaccine adjuvanted (FLUAD) 0.5 ML injection Inject into the muscle. 0.5 mL 0   metFORMIN (GLUCOPHAGE-XR) 500 MG 24 hr tablet TAKE 4 TABLETS BY MOUTH EVERY MORNING 360 tablet 1   olmesartan (BENICAR) 40 MG tablet Take 1 tablet (40 mg total) by mouth daily. Follow-up appt due in August  must see provider for future refills 90 tablet 0   Omega-3 Fatty Acids (FISH OIL) 1000 MG CAPS Take 1,000 mg by mouth daily.     psyllium (FIBER LAXATIVE) 0.52 g capsule Take 3.36 g by mouth daily with lunch.     triamcinolone (NASACORT) 55 MCG/ACT AERO nasal inhaler Place 2 sprays into the nose daily. (Patient taking differently: Place 1 spray into the nose daily as needed (allergies).) 1 Inhaler 12   No current facility-administered medications on file prior to visit.        ROS:  All others reviewed and negative.  Objective        PE:  BP 134/82 (BP Location: Right Arm, Patient Position: Sitting, Cuff Size: Normal)   Pulse (!) 59   Temp 97.9 F (36.6 C) (Oral)   Ht 5\' 6"  (1.676 m)   Wt 251 lb (113.9 kg)   SpO2 99%   BMI 40.51 kg/m                 Constitutional: Pt appears in NAD               HENT: Head: NCAT.                Right Ear: External ear normal.  Right canal with mild wax impaction               Left Ear: External ear normal.                Eyes: . Pupils are equal, round, and reactive to light. Conjunctivae and EOM are normal               Nose: without d/c or deformity               Neck: Neck supple. Gross normal ROM               Cardiovascular: Normal rate and regular rhythm.                 Pulmonary/Chest: Effort normal and breath sounds without rales or wheezing.                 Abd:  Soft, NT, ND, + BS, no organomegaly               Neurological: Pt is alert. At baseline orientation, motor grossly intact               Skin: Skin is warm. No rashes, no other new lesions, LE edema - none               Psychiatric: Pt behavior is normal without agitation   Micro: none  Cardiac tracings I have personally interpreted today:  none  Pertinent Radiological findings (summarize): none   Lab Results  Component Value Date   WBC 6.0 05/27/2023   HGB 11.9 (L) 05/27/2023   HCT 35.5 (L) 05/27/2023   PLT 219.0 05/27/2023   GLUCOSE 158 (H) 05/27/2023   CHOL 131 05/27/2023   TRIG 176.0 (H) 05/27/2023   HDL 40.50 05/27/2023   LDLDIRECT 64.0 05/24/2022   LDLCALC 56 05/27/2023   ALT 16 05/27/2023   AST 30 05/27/2023   NA 140 05/27/2023   K 4.7 05/27/2023   CL 105 05/27/2023   CREATININE 1.57 (H) 05/27/2023   BUN 31 (H) 05/27/2023   CO2 20 05/27/2023   TSH 1.86 05/27/2023   PSA 1.36 05/27/2023   INR 1.17 10/16/2017   HGBA1C  6.4 05/27/2023   MICROALBUR 173.4 (H) 11/12/2016   Assessment/Plan:  David [name] is a 73 y.o. 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) (09/07/2008), Anticardiolipin antibody positive, Anxiety, ANXIETY (07/19/2008), CEREBROVASCULAR ACCIDENT, HX OF (07/19/2008), CVA (cerebral vascular accident) (HCC) (2003), Depression, DEPRESSION (07/19/2008), Diabetes mellitus, DIABETES MELLITUS, TYPE II (01/18/2008), Diverticulosis, GERD (07/19/2008), GERD (gastroesophageal reflux disease), Hemorrhoids, adenomatous colonic polyps, Hyperlipidemia, HYPERLIPIDEMIA (07/19/2008), Hypertension, HYPERTENSION (07/31/2009), Increased prostate specific antigen (PSA) velocity (09/13/2010), Internal hemorrhoids, Mitral regurgitation, Perineal abscess, Peripheral vascular disease (HCC), PERSONAL HX COLONIC POLYPS (12/10/2007), Unspecified Peripheral  Vascular Disease (01/18/2008), and Varicose veins.  Encounter for well adult exam with abnormal findings Age and sex appropriate education and counseling updated with regular exercise and diet Referrals for preventative services - none needed Immunizations addressed - none needed Smoking counseling  - none needed Evidence for depression or other mood disorder - none significant Most recent labs reviewed. I have personally reviewed and have noted: 1) the patient's medical and social history 2) The patient's current medications and supplements 3) The patient's height, weight, and BMI have been recorded in the chart   B12 deficiency Lab Results  Component Value Date   VITAMINB12 743 05/27/2023   Stable, cont oral replacement - b12 1000 mcg qd   CKD (chronic kidney disease) Lab Results  Component Value Date   CREATININE 1.57 (H) 05/27/2023   Stable overall, cont to avoid nephrotoxins   Diabetes (HCC) Lab Results  Component Value Date   HGBA1C 6.4 05/27/2023   Stable, pt to continue current medical treatment farxiga 10 every day, metformin ER 500 - 4 qd   Essential hypertension BP Readings from Last 3 Encounters:  05/27/23 134/82  11/22/22 134/70  06/10/22 (!) 152/70   Stable, pt to continue medical treatment norvasc 10 every day, benicar 40 qd   HLD (hyperlipidemia) Lab Results  Component Value Date   LDLCALC 56 05/27/2023   Stable, pt to continue current statin lipitor 40 qd   Increased prostate specific antigen (PSA) velocity Lab Results  Component Value Date   PSA 1.36 05/27/2023   PSA 1.62 05/24/2022   PSA 2.17 05/24/2021    Stable, cont to monitor  Vitamin D deficiency Last vitamin D Lab Results  Component Value Date   VD25OH 37.54 05/27/2023   Low, to start  oral replacement   Cerumen impaction Mild, for otc debrox prn  Followup: Return in about 6 months (around 11/24/2023).  Oliver Barre, MD 05/31/2023 2:25 PM [facility] Internal Medicine

## 2023-05-31 ENCOUNTER — Encounter: Payer: Self-pay | Admitting: Internal Medicine

## 2023-05-31 DIAGNOSIS — H6120 Impacted cerumen, unspecified ear: Secondary | ICD-10-CM | POA: Insufficient documentation

## 2023-05-31 NOTE — Assessment & Plan Note (Signed)
Lab Results  Component Value Date   HGBA1C 6.4 05/27/2023   Stable, pt to continue current medical treatment farxiga 10 every day, metformin ER 500 - 4 qd

## 2023-05-31 NOTE — Assessment & Plan Note (Signed)
Lab Results  Component Value Date   LDLCALC 56 05/27/2023   Stable, pt to continue current statin lipitor 40 qd

## 2023-05-31 NOTE — Assessment & Plan Note (Signed)
Lab Results  Component Value Date   PSA 1.36 05/27/2023   PSA 1.62 05/24/2022   PSA 2.17 05/24/2021    Stable, cont to monitor

## 2023-05-31 NOTE — Assessment & Plan Note (Signed)
Lab Results  Component Value Date   VITAMINB12 743 05/27/2023   Stable, cont oral replacement - b12 1000 mcg qd

## 2023-05-31 NOTE — Assessment & Plan Note (Signed)
Last vitamin D Lab Results  Component Value Date   VD25OH 37.54 05/27/2023   Low, to start  oral replacement

## 2023-05-31 NOTE — Assessment & Plan Note (Signed)
Lab Results  Component Value Date   CREATININE 1.57 (H) 05/27/2023   Stable overall, cont to avoid nephrotoxins

## 2023-05-31 NOTE — Assessment & Plan Note (Signed)
BP Readings from Last 3 Encounters:  05/27/23 134/82  11/22/22 134/70  06/10/22 (!) 152/70   Stable, pt to continue medical treatment norvasc 10 every day, benicar 40 qd

## 2023-05-31 NOTE — Assessment & Plan Note (Signed)
Mild, for otc debrox prn

## 2023-05-31 NOTE — Assessment & Plan Note (Signed)

## 2023-06-04 ENCOUNTER — Encounter: Payer: Self-pay | Admitting: Podiatry

## 2023-06-04 ENCOUNTER — Ambulatory Visit: Payer: Medicare Other | Admitting: Podiatry

## 2023-06-04 DIAGNOSIS — B351 Tinea unguium: Secondary | ICD-10-CM

## 2023-06-04 DIAGNOSIS — M79675 Pain in left toe(s): Secondary | ICD-10-CM

## 2023-06-04 DIAGNOSIS — E1121 Type 2 diabetes mellitus with diabetic nephropathy: Secondary | ICD-10-CM

## 2023-06-04 DIAGNOSIS — L84 Corns and callosities: Secondary | ICD-10-CM

## 2023-06-04 DIAGNOSIS — M79674 Pain in right toe(s): Secondary | ICD-10-CM

## 2023-06-04 NOTE — Progress Notes (Signed)
  Subjective:  Patient ID: David [name], male    DOB: 12-15-1950,   MRN: [id number]  No chief complaint on file.   73 y.o. male presents for concern of thickened elongated and painful nails that are difficult to trim. Requesting to have them trimmed today. Relates burning and tingling in their feet. Patient is diabetic and last A1c was  Lab Results  Component Value Date   HGBA1C 6.4 05/27/2023   .   PCP:  Corwin Levins, MD    . Denies any other pedal complaints. Denies n/v/f/c.   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   Anticardiolipin antibody positive    Anxiety    ANXIETY 07/19/2008   CEREBROVASCULAR ACCIDENT, HX OF 07/19/2008   CVA (cerebral vascular accident) (HCC) 2003   Depression    DEPRESSION 07/19/2008   Diabetes mellitus    type II   DIABETES MELLITUS, TYPE II 01/18/2008   Diverticulosis    GERD 07/19/2008   GERD (gastroesophageal reflux disease)    Hemorrhoids    Hx of adenomatous colonic polyps    Hyperlipidemia    HYPERLIPIDEMIA 07/19/2008   Hypertension    HYPERTENSION 07/31/2009   Increased prostate specific antigen (PSA) velocity 09/13/2010   Internal hemorrhoids    Mitral regurgitation    Echo 9/21: EF 55-60, no RWMA, normal RVSF, mild-moderate LAE, mild-moderate MR, mild aortic stenosis (mean gradient 9.2 mmHg)   Perineal abscess    Peripheral vascular disease (HCC)    peripheral vascular disease/carotid artery <100%   PERSONAL HX COLONIC POLYPS 12/10/2007   Unspecified Peripheral Vascular Disease 01/18/2008   Varicose veins     Objective:  Physical Exam: Vascular: DP/PT pulses 2/4 bilateral. CFT <3 seconds. Absent hair growth on digits. Edema noted to bilateral lower extremities. Xerosis noted bilaterally.  Skin. No lacerations or abrasions bilateral feet. Nails 1-5 bilateral  are thickened discolored and elongated with  subungual debris. Hyperkeratotic lesion noted sub fifth metatarsal base on left and sub first metatarsal head on right  Musculoskeletal: MMT 5/5 bilateral lower extremities in DF, PF, Inversion and Eversion. Deceased ROM in DF of ankle joint.  Neurological: Sensation intact to light touch. Protective sensation diminished bilateral.     Assessment:   1. Pain due to onychomycosis of toenails of both feet   2. Callus   3. Type 2 diabetes mellitus with diabetic nephropathy, without long-term current use of insulin (HCC)       Plan:  Patient was evaluated and treated and all questions answered. -Discussed and educated patient on diabetic foot care, especially with  regards to the vascular, neurological and musculoskeletal systems.  -Stressed the importance of good glycemic control and the detriment of not  controlling glucose levels in relation to the foot. -Discussed supportive shoes at all times and checking feet regularly.  -Mechanically debrided all nails 1-5 bilateral using sterile nail nipper and filed with dremel without incident  -Hyperkeratotic tissue debrided without incident with chisel x 2  -Answered all patient questions -Patient to return  in 3 months for at risk foot care -Patient advised to call the office if any problems or questions arise in the meantime.   Louann Sjogren, DPM

## 2023-06-09 ENCOUNTER — Ambulatory Visit (HOSPITAL_BASED_OUTPATIENT_CLINIC_OR_DEPARTMENT_OTHER): Payer: Medicare Other | Admitting: Family

## 2023-06-09 ENCOUNTER — Encounter (HOSPITAL_BASED_OUTPATIENT_CLINIC_OR_DEPARTMENT_OTHER): Payer: Self-pay | Admitting: Family

## 2023-06-09 VITALS — BP 150/68 | HR 59 | Ht 66.0 in | Wt 253.3 lb

## 2023-06-09 DIAGNOSIS — I10 Essential (primary) hypertension: Secondary | ICD-10-CM

## 2023-06-09 DIAGNOSIS — I350 Nonrheumatic aortic (valve) stenosis: Secondary | ICD-10-CM | POA: Diagnosis not present

## 2023-06-09 DIAGNOSIS — I6523 Occlusion and stenosis of bilateral carotid arteries: Secondary | ICD-10-CM

## 2023-06-09 DIAGNOSIS — I4819 Other persistent atrial fibrillation: Secondary | ICD-10-CM | POA: Diagnosis not present

## 2023-06-09 DIAGNOSIS — D6859 Other primary thrombophilia: Secondary | ICD-10-CM

## 2023-06-09 DIAGNOSIS — E785 Hyperlipidemia, unspecified: Secondary | ICD-10-CM

## 2023-06-09 MED ORDER — APIXABAN 5 MG PO TABS: 5.0000 mg | ORAL_TABLET | Freq: Two times a day (BID) | ORAL | 3 refills | Status: DC

## 2023-06-09 NOTE — Progress Notes (Signed)
Cardiology Office Note:  .   Date:  06/09/2023  ID:  David [name], DOB 12/31/1950, MRN [id number] PCP: Corwin Levins, MD  [facility] Providers Cardiologist:  Verne Carrow, MD    History of Present Illness: .   David [name] is a 73 y.o. male with a history of mitral regurgitation, hypertension, hyperlipidemia, DM2, persistent atrial fibrillation, PAD semi-AAA s/p open repair 2012, remote CVA, GERD.  Prior nuclear stress 2010 no ischemia. Diagnosed with A-fib in 2016 while in hospital for PV procedure.  Echo 01/2020 normal LVEF 55 to 60%, mild to moderate MR, mild AS.  Most recent echo 07/2022 LVEF 55 to 6%, mild LVH, LA severely dilated, RA moderately dilated, mild to moderate MR, mild AAS, mild dilation ascending aorta 40 mm recommended for repeat study in 2 years per Dr. Clifton James.  Presents today for follow up. Feeling well since last seen. Walking for exercise intermittently. Eats predominantly at home and tries to follow low sodium diet. Reports no shortness of breath nor dyspnea on exertion. Reports no chest pain, pressure, or tightness. No edema, orthopnea, PND. Reports no palpitations.  Notes mild bruising with Eliquis but no hematuria nor melena.  ROS: Please see the history of present illness.    All other systems reviewed and are negative.   Studies Reviewed: Marland Kitchen   EKG Interpretation Date/Time:  Monday June 09 2023 09:22:53 EST Ventricular Rate:  61 PR Interval:    QRS Duration:  86 QT Interval:  398 QTC Calculation: 400 R Axis:   32  Text Interpretation: Atrial fibrillation Stable TWI V1, V2. No acute ST/T wave changes. Confirmed by Gillian Shields ([id number]) on 06/09/2023 9:38:58 AM    Cardiac Studies & Procedures      ECHOCARDIOGRAM  ECHOCARDIOGRAM COMPLETE 07/24/2022  Narrative ECHOCARDIOGRAM REPORT    Patient Name:   David [name] Date of Exam: 07/24/2022 Medical Rec #:  [id number]       Height:       66.0 in Accession #:    [id number]       Weight:       251.4 lb Date of Birth:  1950-05-20       BSA:          2.204 m Patient Age:    71 years        BP:           178/88 mmHg Patient Gender: M               HR:           62 bpm. Exam Location:  [address]  Procedure: 2D Echo, Cardiac Doppler and Color Doppler  Indications:    I35.0 Aortic Stenosis  History:        Patient has prior history of Echocardiogram examinations, most recent 01/18/2020. Stroke and PVD, Arrythmias:Atrial Fibrillation; Risk Factors:Hypertension, Diabetes and HLD.  Sonographer:    Clearence Ped RCS Referring Phys: [id number] CHRISTOPHER D MCALHANY  IMPRESSIONS   1. Left ventricular ejection fraction, by estimation, is 55 to 60%. The left ventricle has normal function. Left ventricular endocardial border not optimally defined to evaluate regional wall motion. There is mild left ventricular hypertrophy of the basal-septal segment. Left ventricular diastolic function could not be evaluated. 2. Right ventricular systolic function is normal. The right ventricular size is mildly enlarged. There is normal pulmonary artery systolic pressure. 3. Left atrial size was severely dilated. 4. Right atrial size was moderately dilated. 5. The mitral valve  is normal in structure. Mild to moderate mitral valve regurgitation. 6. The aortic valve is tricuspid. There is moderate calcification of the aortic valve. There is moderate thickening of the aortic valve. Aortic valve regurgitation is not visualized. Mild aortic valve stenosis. 7. There is mild dilatation of the ascending aorta, measuring 40 mm.  Comparison(s): No significant change from prior study. Prior images reviewed side by side.  FINDINGS Left Ventricle: Left ventricular ejection fraction, by estimation, is 55 to 60%. The left ventricle has normal function. Left ventricular endocardial border not optimally defined to evaluate regional wall motion. The left ventricular internal cavity size was normal in size.  There is mild left ventricular hypertrophy of the basal-septal segment. Left ventricular diastolic function could not be evaluated due to atrial fibrillation. Left ventricular diastolic function could not be evaluated.  Right Ventricle: The right ventricular size is mildly enlarged. No increase in right ventricular wall thickness. Right ventricular systolic function is normal. There is normal pulmonary artery systolic pressure. The tricuspid regurgitant velocity is 1.94 m/s, and with an assumed right atrial pressure of 3 mmHg, the estimated right ventricular systolic pressure is 18.1 mmHg.  Left Atrium: Left atrial size was severely dilated.  Right Atrium: Right atrial size was moderately dilated.  Pericardium: There is no evidence of pericardial effusion.  Mitral Valve: The mitral valve is normal in structure. Mild mitral annular calcification. Mild to moderate mitral valve regurgitation, with centrally-directed jet.  Tricuspid Valve: The tricuspid valve is normal in structure. Tricuspid valve regurgitation is not demonstrated.  Aortic Valve: The aortic valve is tricuspid. There is moderate calcification of the aortic valve. There is moderate thickening of the aortic valve. Aortic valve regurgitation is not visualized. Mild aortic stenosis is present. Aortic valve mean gradient measures 11.8 mmHg. Aortic valve peak gradient measures 19.3 mmHg. Aortic valve area, by VTI measures 1.60 cm.  Pulmonic Valve: The pulmonic valve was grossly normal. Pulmonic valve regurgitation is not visualized.  Aorta: The aortic root is normal in size and structure. There is mild dilatation of the ascending aorta, measuring 40 mm.  IAS/Shunts: No atrial level shunt detected by color flow Doppler.   LEFT VENTRICLE PLAX 2D LVIDd:         4.60 cm   Diastology LVIDs:         3.10 cm   LV e' medial:    10.90 cm/s LV PW:         1.10 cm   LV E/e' medial:  11.3 LV IVS:        1.30 cm   LV e' lateral:   15.60  cm/s LVOT diam:     2.10 cm   LV E/e' lateral: 7.9 LV SV:         89 LV SV Index:   40 LVOT Area:     3.46 cm   RIGHT VENTRICLE RV Basal diam:  3.40 cm RV S prime:     12.00 cm/s TAPSE (M-mode): 1.9 cm RVSP:           18.1 mmHg  LEFT ATRIUM              Index        RIGHT ATRIUM           Index LA diam:        5.90 cm  2.68 cm/m   RA Pressure: 3.00 mmHg LA Vol (A2C):   104.0 ml 47.19 ml/m  RA Area:     17.20 cm LA  Vol (A4C):   73.5 ml  33.35 ml/m  RA Volume:   41.40 ml  18.78 ml/m LA Biplane Vol: 92.7 ml  42.06 ml/m AORTIC VALVE AV Area (Vmax):    1.38 cm AV Area (Vmean):   1.50 cm AV Area (VTI):     1.60 cm AV Vmax:           219.60 cm/s AV Vmean:          161.840 cm/s AV VTI:            0.559 m AV Peak Grad:      19.3 mmHg AV Mean Grad:      11.8 mmHg LVOT Vmax:         87.27 cm/s LVOT Vmean:        70.033 cm/s LVOT VTI:          0.258 m LVOT/AV VTI ratio: 0.46  AORTA Ao Root diam: 3.30 cm Ao Asc diam:  4.00 cm  MITRAL VALVE                  TRICUSPID VALVE MV Area (PHT):                TR Peak grad:   15.1 mmHg MV Decel Time:                TR Vmax:        194.00 cm/s MR Peak grad:    73.3 mmHg    Estimated RAP:  3.00 mmHg MR Mean grad:    56.0 mmHg    RVSP:           18.1 mmHg MR Vmax:         428.00 cm/s MR Vmean:        365.0 cm/s   SHUNTS MR PISA:         2.26 cm     Systemic VTI:  0.26 m MR PISA Eff ROA: 15 mm       Systemic Diam: 2.10 cm MR PISA Radius:  0.60 cm MV E velocity: 123.50 cm/s  Rachelle Hora Croitoru MD Electronically signed by Thurmon Fair MD Signature Date/Time: 07/24/2022/11:22:17 AM    Final             Risk Assessment/Calculations:    CHA2DS2-VASc Score = 6   This indicates a 9.7% annual risk of stroke. The patient's score is based upon: CHF History: 0 HTN History: 1 Diabetes History: 1 Stroke History: 2 Vascular Disease History: 1 Age Score: 1 Gender Score: 0    HYPERTENSION CONTROL Vitals:   06/09/23 0926  06/09/23 0945  BP: (!) 152/68 (!) 150/68    The patient's blood pressure is elevated above target today.  In order to address the patient's elevated BP: Blood pressure will be monitored at home to determine if medication changes need to be made.; Follow up with general cardiology has been recommended. (MyChart message in 2 weeks to check in)          Physical Exam:   VS:  BP (!) 150/68   Pulse (!) 59   Ht 5\' 6"  (1.676 m)   Wt 253 lb 4.8 oz (114.9 kg)   SpO2 98%   BMI 40.88 kg/m    Wt Readings from Last 3 Encounters:  06/09/23 253 lb 4.8 oz (114.9 kg)  05/27/23 251 lb (113.9 kg)  11/22/22 237 lb (107.5 kg)    GEN: Well nourished, overweight, well developed in no acute distress NECK: No JVD;  No carotid bruits CARDIAC: IRIR, no murmurs, rubs, gallops RESPIRATORY:  Clear to auscultation without rales, wheezing or rhonchi  ABDOMEN: Soft, non-tender, non-distended EXTREMITIES:  No edema; No deformity   ASSESSMENT AND PLAN: .    Permanent atrial fibrillation/hypercoagulable state - Rate controlled not requiring AV nodal blocking agents. CHA2DS2-VASc Score = 6 [CHF History: 0, HTN History: 1, Diabetes History: 1, Stroke History: 2, Vascular Disease History: 1, Age Score: 1, Gender Score: 0].  Therefore, the patient's annual risk of stroke is 9.7 %.    Continue Eliquis 5 mg twice daily, refills provided.  Denies bleeding applications.  HTN-BP elevated in clinic but controlled at PCP visit last month.  He will begin checking at home and will check in via MyChart message in 2 weeks.  In the interim continue present regimen amlodipine 10 mg daily, furosemide 40 mg daily, olmesartan 40 mg daily.  HLD, LDL goal less than 70-1/20/2025 total cholesterol 131, triglycerides 176 (improved from prior 193), HDL 44.5, LDL 56.  Attributes the lowering of his triglycerides to diet and exercise changes.  Continue gemfibrozil 1200 mg daily, fish oil.  PVD/carotid disease/AAA-follows with [facility]  Mild to  moderate MR / mild aortic stenosis / dilation aortic root 40mm - no symptoms concerning for HF. Stable by echo 07/2022. Repeat in 2 years per Dr. Clifton James.        Dispo: follow up in 1 year  Signed, Alver Sorrow, NP

## 2023-06-09 NOTE — Patient Instructions (Signed)
Medication Instructions:  Your physician recommends that you continue on your current medications as directed. Please refer to the Current Medication list given to you today.   *If you need a refill on your cardiac medications before your next appointment, please call your pharmacy*  Lab Work: NONE   Testing/Procedures: NONE   Follow-Up: At [facility], you and your health needs are our priority.  As part of our continuing mission to provide you with exceptional heart care, we have created designated Provider Care Teams.  These Care Teams include your primary Cardiologist (physician) and Advanced Practice Providers (APPs -  Physician Assistants and Nurse Practitioners) who all work together to provide you with the care you need, when you need it.  We recommend signing up for the patient portal called "MyChart".  Sign up information is provided on this After Visit Summary.  MyChart is used to connect with patients for Virtual Visits (Telemedicine).  Patients are able to view lab/test results, encounter notes, upcoming appointments, etc.  Non-urgent messages can be sent to your provider as well.   To learn more about what you can do with MyChart, go to [email or web address].    Your next appointment:   12 MONTHS   Provider:   DR Clifton James  Other Instructions MONITOR YOUR BLOOD PRESSURE TWICE A DAY AT HOME AND LOG. IN ABOUT 2 WEEKS CALL OR MYCHART THE OFFICE WITH YOUR READINGS

## 2023-06-23 ENCOUNTER — Encounter (HOSPITAL_BASED_OUTPATIENT_CLINIC_OR_DEPARTMENT_OTHER): Payer: Self-pay

## 2023-07-08 ENCOUNTER — Other Ambulatory Visit: Payer: Self-pay | Admitting: Cardiovascular Disease

## 2023-07-08 DIAGNOSIS — I4819 Other persistent atrial fibrillation: Secondary | ICD-10-CM

## 2023-07-08 NOTE — Telephone Encounter (Signed)
 Eliquis 5mg  refill request received. Patient is 73 years old, weight-114.9kg, Crea-1.57 on 05/27/23, Diagnosis-Afib, and last seen by Gillian Shields on 06/09/23. Dose is appropriate based on dosing criteria. Will send in refill to requested pharmacy.

## 2023-07-16 DIAGNOSIS — N1831 Chronic kidney disease, stage 3a: Secondary | ICD-10-CM | POA: Diagnosis not present

## 2023-07-18 DIAGNOSIS — I129 Hypertensive chronic kidney disease with stage 1 through stage 4 chronic kidney disease, or unspecified chronic kidney disease: Secondary | ICD-10-CM | POA: Diagnosis not present

## 2023-07-18 DIAGNOSIS — N1831 Chronic kidney disease, stage 3a: Secondary | ICD-10-CM | POA: Diagnosis not present

## 2023-07-18 DIAGNOSIS — R809 Proteinuria, unspecified: Secondary | ICD-10-CM | POA: Diagnosis not present

## 2023-07-18 DIAGNOSIS — E559 Vitamin D deficiency, unspecified: Secondary | ICD-10-CM | POA: Diagnosis not present

## 2023-07-18 DIAGNOSIS — R609 Edema, unspecified: Secondary | ICD-10-CM | POA: Diagnosis not present

## 2023-07-18 DIAGNOSIS — E1122 Type 2 diabetes mellitus with diabetic chronic kidney disease: Secondary | ICD-10-CM | POA: Diagnosis not present

## 2023-07-30 ENCOUNTER — Other Ambulatory Visit: Payer: Self-pay | Admitting: Internal Medicine

## 2023-07-30 ENCOUNTER — Other Ambulatory Visit: Payer: Self-pay

## 2023-10-01 ENCOUNTER — Encounter: Payer: Self-pay | Admitting: Podiatry

## 2023-10-01 ENCOUNTER — Ambulatory Visit: Payer: Medicare Other | Admitting: Podiatry

## 2023-10-01 DIAGNOSIS — M79675 Pain in left toe(s): Secondary | ICD-10-CM

## 2023-10-01 DIAGNOSIS — L84 Corns and callosities: Secondary | ICD-10-CM

## 2023-10-01 DIAGNOSIS — M79674 Pain in right toe(s): Secondary | ICD-10-CM | POA: Diagnosis not present

## 2023-10-01 DIAGNOSIS — B351 Tinea unguium: Secondary | ICD-10-CM

## 2023-10-01 DIAGNOSIS — E1121 Type 2 diabetes mellitus with diabetic nephropathy: Secondary | ICD-10-CM

## 2023-10-01 NOTE — Progress Notes (Signed)
  Subjective:  Patient ID: David [name], male    DOB: 1950/12/05,   MRN: [id number]  Chief Complaint  Patient presents with   Nail Problem    Nail trim     73 y.o. male presents for concern of thickened elongated and painful nails that are difficult to trim. Requesting to have them trimmed today. Relates burning and tingling in their feet. Patient is diabetic and last A1c was  Lab Results  Component Value Date   HGBA1C 6.4 05/27/2023   .   PCP:  Roslyn Coombe, MD    . Denies any other pedal complaints. Denies n/v/f/c.   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   Anticardiolipin antibody positive    Anxiety    ANXIETY 07/19/2008   CEREBROVASCULAR ACCIDENT, HX OF 07/19/2008   CVA (cerebral vascular accident) (HCC) 2003   Depression    DEPRESSION 07/19/2008   Diabetes mellitus    type II   DIABETES MELLITUS, TYPE II 01/18/2008   Diverticulosis    GERD 07/19/2008   GERD (gastroesophageal reflux disease)    Hemorrhoids    Hx of adenomatous colonic polyps    Hyperlipidemia    HYPERLIPIDEMIA 07/19/2008   Hypertension    HYPERTENSION 07/31/2009   Increased prostate specific antigen (PSA) velocity 09/13/2010   Internal hemorrhoids    Mitral regurgitation    Echo 9/21: EF 55-60, no RWMA, normal RVSF, mild-moderate LAE, mild-moderate MR, mild aortic stenosis (mean gradient 9.2 mmHg)   Perineal abscess    Peripheral vascular disease (HCC)    peripheral vascular disease/carotid artery <100%   PERSONAL HX COLONIC POLYPS 12/10/2007   Unspecified Peripheral Vascular Disease 01/18/2008   Varicose veins     Objective:  Physical Exam: Vascular: DP/PT pulses 2/4 bilateral. CFT <3 seconds. Absent hair growth on digits. Edema noted to bilateral lower extremities. Xerosis noted bilaterally.  Skin. No lacerations or abrasions bilateral feet. Nails 1-5 bilateral  are  thickened discolored and elongated with subungual debris. Hyperkeratotic lesion noted sub fifth metatarsal base on left and sub first metatarsal head on right  Musculoskeletal: MMT 5/5 bilateral lower extremities in DF, PF, Inversion and Eversion. Deceased ROM in DF of ankle joint.  Neurological: Sensation intact to light touch. Protective sensation diminished bilateral.     Assessment:   1. Pain due to onychomycosis of toenails of both feet   2. Type 2 diabetes mellitus with diabetic nephropathy, without long-term current use of insulin (HCC)   3. Callus       Plan:  Patient was evaluated and treated and all questions answered. -Discussed and educated patient on diabetic foot care, especially with  regards to the vascular, neurological and musculoskeletal systems.  -Stressed the importance of good glycemic control and the detriment of not  controlling glucose levels in relation to the foot. -Discussed supportive shoes at all times and checking feet regularly.  -Mechanically debrided all nails 1-5 bilateral using sterile nail nipper and filed with dremel without incident  -Hyperkeratotic tissue debrided without incident with chisel x 2  -Answered all patient questions -Patient to return  in 4 months for at risk foot care -Patient advised to call the office if any problems or questions arise in the meantime.   Jennefer Moats, DPM

## 2023-10-22 ENCOUNTER — Other Ambulatory Visit: Payer: Self-pay | Admitting: Internal Medicine

## 2023-11-24 ENCOUNTER — Encounter: Payer: Self-pay | Admitting: Internal Medicine

## 2023-11-24 ENCOUNTER — Encounter (HOSPITAL_BASED_OUTPATIENT_CLINIC_OR_DEPARTMENT_OTHER): Payer: Self-pay

## 2023-11-24 ENCOUNTER — Ambulatory Visit: Payer: Self-pay | Admitting: Internal Medicine

## 2023-11-24 ENCOUNTER — Ambulatory Visit (INDEPENDENT_AMBULATORY_CARE_PROVIDER_SITE_OTHER): Payer: Medicare Other | Admitting: Internal Medicine

## 2023-11-24 ENCOUNTER — Emergency Department (HOSPITAL_BASED_OUTPATIENT_CLINIC_OR_DEPARTMENT_OTHER)

## 2023-11-24 ENCOUNTER — Other Ambulatory Visit: Payer: Self-pay

## 2023-11-24 ENCOUNTER — Telehealth: Payer: Self-pay

## 2023-11-24 ENCOUNTER — Observation Stay (HOSPITAL_BASED_OUTPATIENT_CLINIC_OR_DEPARTMENT_OTHER)
Admission: EM | Admit: 2023-11-24 | Discharge: 2023-11-26 | Disposition: A | Discharge status: Home / Self Care | Source: Ambulatory Visit | Attending: Internal Medicine | Admitting: Internal Medicine

## 2023-11-24 VITALS — BP 142/70 | HR 43 | Temp 98.2°F | Ht 66.0 in | Wt 243.8 lb

## 2023-11-24 DIAGNOSIS — E1122 Type 2 diabetes mellitus with diabetic chronic kidney disease: Secondary | ICD-10-CM | POA: Diagnosis not present

## 2023-11-24 DIAGNOSIS — N1831 Chronic kidney disease, stage 3a: Secondary | ICD-10-CM | POA: Insufficient documentation

## 2023-11-24 DIAGNOSIS — Z79899 Other long term (current) drug therapy: Secondary | ICD-10-CM | POA: Diagnosis not present

## 2023-11-24 DIAGNOSIS — I10 Essential (primary) hypertension: Secondary | ICD-10-CM | POA: Diagnosis not present

## 2023-11-24 DIAGNOSIS — E7800 Pure hypercholesterolemia, unspecified: Secondary | ICD-10-CM | POA: Diagnosis not present

## 2023-11-24 DIAGNOSIS — N179 Acute kidney failure, unspecified: Secondary | ICD-10-CM | POA: Diagnosis not present

## 2023-11-24 DIAGNOSIS — Z87891 Personal history of nicotine dependence: Secondary | ICD-10-CM | POA: Insufficient documentation

## 2023-11-24 DIAGNOSIS — Z7901 Long term (current) use of anticoagulants: Secondary | ICD-10-CM | POA: Diagnosis not present

## 2023-11-24 DIAGNOSIS — K219 Gastro-esophageal reflux disease without esophagitis: Secondary | ICD-10-CM | POA: Diagnosis not present

## 2023-11-24 DIAGNOSIS — I4819 Other persistent atrial fibrillation: Secondary | ICD-10-CM | POA: Insufficient documentation

## 2023-11-24 DIAGNOSIS — E1121 Type 2 diabetes mellitus with diabetic nephropathy: Secondary | ICD-10-CM

## 2023-11-24 DIAGNOSIS — Z8679 Personal history of other diseases of the circulatory system: Secondary | ICD-10-CM

## 2023-11-24 DIAGNOSIS — Z7984 Long term (current) use of oral hypoglycemic drugs: Secondary | ICD-10-CM | POA: Diagnosis not present

## 2023-11-24 DIAGNOSIS — R0902 Hypoxemia: Secondary | ICD-10-CM | POA: Insufficient documentation

## 2023-11-24 DIAGNOSIS — I129 Hypertensive chronic kidney disease with stage 1 through stage 4 chronic kidney disease, or unspecified chronic kidney disease: Secondary | ICD-10-CM | POA: Diagnosis not present

## 2023-11-24 DIAGNOSIS — Z7982 Long term (current) use of aspirin: Secondary | ICD-10-CM | POA: Insufficient documentation

## 2023-11-24 DIAGNOSIS — E559 Vitamin D deficiency, unspecified: Secondary | ICD-10-CM | POA: Diagnosis not present

## 2023-11-24 DIAGNOSIS — D509 Iron deficiency anemia, unspecified: Principal | ICD-10-CM | POA: Insufficient documentation

## 2023-11-24 DIAGNOSIS — R918 Other nonspecific abnormal finding of lung field: Secondary | ICD-10-CM | POA: Diagnosis not present

## 2023-11-24 DIAGNOSIS — Z8673 Personal history of transient ischemic attack (TIA), and cerebral infarction without residual deficits: Secondary | ICD-10-CM | POA: Diagnosis not present

## 2023-11-24 DIAGNOSIS — Z91040 Latex allergy status: Secondary | ICD-10-CM | POA: Insufficient documentation

## 2023-11-24 DIAGNOSIS — D649 Anemia, unspecified: Principal | ICD-10-CM | POA: Diagnosis present

## 2023-11-24 DIAGNOSIS — I700 Atherosclerosis of aorta: Secondary | ICD-10-CM | POA: Diagnosis not present

## 2023-11-24 LAB — RETICULOCYTES
Immature Retic Fract: 25.7 % — ABNORMAL HIGH (ref 2.3–15.9)
RBC.: 2.72 MIL/uL — ABNORMAL LOW (ref 4.22–5.81)
Retic Count, Absolute: 61.7 K/uL (ref 19.0–186.0)
Retic Ct Pct: 2.3 % (ref 0.4–3.1)

## 2023-11-24 LAB — CBC WITH DIFFERENTIAL/PLATELET
Abs Immature Granulocytes: 0.04 K/uL (ref 0.00–0.07)
Basophils Absolute: 0.1 K/uL (ref 0.0–0.1)
Basophils Absolute: 0.1 K/uL (ref 0.0–0.1)
Basophils Relative: 1.8 % (ref 0.0–3.0)
Basophils Relative: 1 %
Eosinophils Absolute: 0.5 K/uL (ref 0.0–0.7)
Eosinophils Absolute: 0.5 K/uL (ref 0.0–0.5)
Eosinophils Relative: 11.6 % — ABNORMAL HIGH (ref 0.0–5.0)
Eosinophils Relative: 12 %
HCT: 22.9 % — CL (ref 39.0–52.0)
HCT: 23.8 % — ABNORMAL LOW (ref 39.0–52.0)
Hemoglobin: 7.5 g/dL — CL (ref 13.0–17.0)
Hemoglobin: 7.5 g/dL — ABNORMAL LOW (ref 13.0–17.0)
Immature Granulocytes: 1 %
Lymphocytes Relative: 17.4 % (ref 12.0–46.0)
Lymphocytes Relative: 20 %
Lymphs Abs: 0.7 K/uL (ref 0.7–4.0)
Lymphs Abs: 0.9 K/uL (ref 0.7–4.0)
MCH: 27.2 pg (ref 26.0–34.0)
MCHC: 32.8 g/dL (ref 30.0–36.0)
MCHC: 31.5 g/dL (ref 30.0–36.0)
MCV: 83.5 fl (ref 78.0–100.0)
MCV: 86.2 fL (ref 80.0–100.0)
Monocytes Absolute: 0.6 K/uL (ref 0.1–1.0)
Monocytes Absolute: 0.6 K/uL (ref 0.1–1.0)
Monocytes Relative: 14.1 % — ABNORMAL HIGH (ref 3.0–12.0)
Monocytes Relative: 15 %
Neutro Abs: 2.3 K/uL (ref 1.4–7.7)
Neutro Abs: 2.3 K/uL (ref 1.7–7.7)
Neutrophils Relative %: 55.1 % (ref 43.0–77.0)
Neutrophils Relative %: 51 %
Platelets: 220 K/uL (ref 150.0–400.0)
Platelets: 247 K/uL (ref 150–400)
RBC: 2.75 Mil/uL — ABNORMAL LOW (ref 4.22–5.81)
RBC: 2.76 MIL/uL — ABNORMAL LOW (ref 4.22–5.81)
RDW: 17.8 % — ABNORMAL HIGH (ref 11.5–15.5)
RDW: 15.5 % (ref 11.5–15.5)
WBC: 4.1 K/uL (ref 4.0–10.5)
WBC: 4.4 K/uL (ref 4.0–10.5)
nRBC: 0 % (ref 0.0–0.2)

## 2023-11-24 LAB — TYPE AND SCREEN
ABO/RH(D): B POS
Antibody Screen: NEGATIVE

## 2023-11-24 LAB — LIPID PANEL
Cholesterol: 118 mg/dL (ref 0–200)
HDL: 30.1 mg/dL — ABNORMAL LOW (ref >39.00)
LDL Cholesterol: 50 mg/dL (ref 0–99)
NonHDL: 87.51
Total CHOL/HDL Ratio: 4
Triglycerides: 190 mg/dL — ABNORMAL HIGH (ref 0.0–149.0)
VLDL: 38 mg/dL (ref 0.0–40.0)

## 2023-11-24 LAB — BASIC METABOLIC PANEL WITH GFR
BUN: 46 mg/dL — ABNORMAL HIGH (ref 6–23)
CO2: 24 meq/L (ref 19–32)
Calcium: 10.6 mg/dL — ABNORMAL HIGH (ref 8.4–10.5)
Chloride: 101 meq/L (ref 96–112)
Creatinine, Ser: 2.2 mg/dL — ABNORMAL HIGH (ref 0.40–1.50)
GFR: 28.66 mL/min — ABNORMAL LOW (ref >60.00)
Glucose, Bld: 139 mg/dL — ABNORMAL HIGH (ref 70–99)
Potassium: 4.6 meq/L (ref 3.5–5.1)
Sodium: 136 meq/L (ref 135–145)

## 2023-11-24 LAB — PROTIME-INR
INR: 1.3 — ABNORMAL HIGH (ref 0.8–1.2)
Prothrombin Time: 16.5 s — ABNORMAL HIGH (ref 11.4–15.2)

## 2023-11-24 LAB — COMPREHENSIVE METABOLIC PANEL WITH GFR
ALT: 15 U/L (ref 0–44)
AST: 29 U/L (ref 15–41)
Albumin: 4.3 g/dL (ref 3.5–5.0)
Alkaline Phosphatase: 39 U/L (ref 38–126)
Anion gap: 18 — ABNORMAL HIGH (ref 5–15)
BUN: 49 mg/dL — ABNORMAL HIGH (ref 8–23)
CO2: 22 mmol/L (ref 22–32)
Calcium: 11.6 mg/dL — ABNORMAL HIGH (ref 8.9–10.3)
Chloride: 99 mmol/L (ref 98–111)
Creatinine, Ser: 2.28 mg/dL — ABNORMAL HIGH (ref 0.61–1.24)
GFR, Estimated: 29 mL/min — ABNORMAL LOW (ref >60)
Glucose, Bld: 112 mg/dL — ABNORMAL HIGH (ref 70–99)
Potassium: 4.4 mmol/L (ref 3.5–5.1)
Sodium: 139 mmol/L (ref 135–145)
Total Bilirubin: 0.3 mg/dL (ref 0.0–1.2)
Total Protein: 8.1 g/dL (ref 6.5–8.1)

## 2023-11-24 LAB — IRON AND TIBC
Iron: 33 ug/dL — ABNORMAL LOW (ref 45–182)
Saturation Ratios: 6 % — ABNORMAL LOW (ref 17.9–39.5)
TIBC: 529 ug/dL — ABNORMAL HIGH (ref 250–450)
UIBC: 496 ug/dL

## 2023-11-24 LAB — HEPATIC FUNCTION PANEL
ALT: 11 U/L (ref 0–53)
AST: 20 U/L (ref 0–37)
Albumin: 4.3 g/dL (ref 3.5–5.2)
Alkaline Phosphatase: 30 U/L — ABNORMAL LOW (ref 39–117)
Bilirubin, Direct: 0.1 mg/dL (ref 0.0–0.3)
Total Bilirubin: 0.4 mg/dL (ref 0.2–1.2)
Total Protein: 7.7 g/dL (ref 6.0–8.3)

## 2023-11-24 LAB — HEMOGLOBIN A1C: Hgb A1c MFr Bld: 6 % (ref 4.6–6.5)

## 2023-11-24 LAB — FERRITIN: Ferritin: 25 ng/mL (ref 24–336)

## 2023-11-24 LAB — OCCULT BLOOD X 1 CARD TO LAB, STOOL: Fecal Occult Bld: NEGATIVE

## 2023-11-24 LAB — VITAMIN B12: Vitamin B-12: 823 pg/mL (ref 180–914)

## 2023-11-24 LAB — GLUCOSE, CAPILLARY: Glucose-Capillary: 140 mg/dL — ABNORMAL HIGH (ref 70–99)

## 2023-11-24 LAB — VITAMIN D 25 HYDROXY (VIT D DEFICIENCY, FRACTURES): VITD: 39.52 ng/mL (ref 30.00–100.00)

## 2023-11-24 MED ORDER — INSULIN ASPART 100 UNIT/ML IJ SOLN: 0.0000 [IU] | Freq: Three times a day (TID) | INTRAMUSCULAR | Status: DC | PRN

## 2023-11-24 MED ORDER — INSULIN ASPART 100 UNIT/ML IJ SOLN: 0.0000 [IU] | Freq: Every day | INTRAMUSCULAR | Status: DC

## 2023-11-24 MED ORDER — AMLODIPINE BESYLATE 10 MG PO TABS
10.0000 mg | ORAL_TABLET | Freq: Every day | ORAL | Status: DC
  Administered 2023-11-25 – 2023-11-26 (×2): 10 mg via ORAL
  Filled 2023-11-24 (×2): qty 1

## 2023-11-24 MED ORDER — HYDRALAZINE HCL 20 MG/ML IJ SOLN: 10.0000 mg | Freq: Four times a day (QID) | INTRAMUSCULAR | Status: DC | PRN

## 2023-11-24 MED ORDER — SODIUM CHLORIDE 0.9 % IV BOLUS
500.0000 mL | Freq: Once | INTRAVENOUS | Status: AC
  Administered 2023-11-24: 500 mL via INTRAVENOUS

## 2023-11-24 MED ORDER — ATORVASTATIN CALCIUM 40 MG PO TABS
40.0000 mg | ORAL_TABLET | Freq: Every day | ORAL | Status: DC
  Administered 2023-11-25 – 2023-11-26 (×2): 40 mg via ORAL
  Filled 2023-11-24 (×2): qty 1

## 2023-11-24 MED ORDER — PANTOPRAZOLE SODIUM 40 MG IV SOLR
40.0000 mg | Freq: Once | INTRAVENOUS | Status: AC
  Administered 2023-11-24: 40 mg via INTRAVENOUS
  Filled 2023-11-24: qty 10

## 2023-11-24 NOTE — Patient Instructions (Signed)

## 2023-11-24 NOTE — Assessment & Plan Note (Signed)
 Lab Results  Component Value Date   HGBA1C 6.0 11/24/2023   Stable, pt to continue current medical treatment farxiga  10 every day, metformin  ER 500 mg - 4 qd

## 2023-11-24 NOTE — Plan of Care (Addendum)
 [facility] emergency department to [facility] progressive unit transfer:   73 year old man history of permanent atrial fibrillation on chronic anticoagulation, essential hypertension, hyperlipidemia, peripheral artery disease, carotid artery disease, AAA, mitral regurgitation/aortic stenosis/aortic root dilation, remote CVA, history of AAA status post repair 2012 has been referred to emergency department as patient found to have low hemoglobin 7.5.  Baseline hemoglobin around 11-13.  Patient does not have any evidence of GI bleed.  Patient has previous colonoscopy and EGD with [facility] which showed colonic polyp and internal hemorrhoid otherwise EGD unremarkable. Dr. Dean will consult [facility] to see the patient. Also stated that when patient was turned around  check the FOBT patient became short of breath and O2 sat dropped to 80% on room air. Patient denies any history of GI bleed and no recent bleeding as well.  Presentation to ED patient found borderline hypertensive and bradycardic heart rate between 35-57. EKG showed atrial fibrillation rate controlled 53 and premature ventricular response.  CBC showed low hemoglobin 7.5.  Low ferritin level pending anemia panel. FOBT negative.  Hemoglobin is around 11 6 months ago. CMP showing elevated creatinine 2.28 otherwise and elevated BUN 49.  Creatinine was around 1.576 months ago.  Chest x-ray showed  evidence of pulmonary edema.. Left base opacity possibly due to patient rotation. 3. Recommend repeat chest x-ray PA and lateral view for further evaluation. 4.  Aortic Atherosclerosis (ICD10-I70.0).  The patient has been given IV Protonix  40 mg.   Hospitalist has been consulted for further evaluation management of acute symptomatic anemia without any evidence of GI bleed and acute kidney injury.  Obtaining type and screen. Patient will need blood transfusion upon arrival to hospital.  [facility] will assume care on arrival to  accepting facility. Until arrival, care as per EDP. However, [facility] available 24/7 for questions and assistance. Check [email or web address] for on-call coverage. Nursing staff, please call [facility] Admits & Consults System-Wide number under Amion on patient's arrival so appropriate admitting provider can evaluate the pt.   Author: Devota Viruet, MD  [facility]

## 2023-11-24 NOTE — Telephone Encounter (Signed)
 Contacted patient and reviewed with I'm importance of heading to ED ASAP. Patient noted they would be checking over their lab results first via MyChart before heading over.

## 2023-11-24 NOTE — Telephone Encounter (Signed)
 CRITICAL VALUE STICKER  CRITICAL VALUE: Hemoglobin at 7.5 and Hemacrate 22.9   RECEIVER (on-site recipient of call): Ronnald  DATE & TIME NOTIFIED: 11/24/23 @12 : 16 pm   MESSENGER (representative from lab):Jessica  MD NOTIFIED: Yes  TIME OF NOTIFICATION: 12 : 30

## 2023-11-24 NOTE — ED Triage Notes (Signed)
 Pt to PCP today for routine checkup, Hgn reported low (7.5). denies associated symptoms- lightheaded/ dizzy, HA, SHOB, denies known source of bleed.

## 2023-11-24 NOTE — Telephone Encounter (Signed)
 Please to contact pt   Pt has new severe anemia, and possibly might need transfusion -   Please ask pt to go to ED now for evaluation

## 2023-11-24 NOTE — Progress Notes (Signed)
 Patient ID: David [name], male   DOB: November 08, 1950, 73 y.o.   MRN: [id number]        Chief Complaint: follow up dm, htn, hld, ckd3a,       HPI:  David [name] is a 73 y.o. male here overall doing ok, Pt denies chest pain, increased sob or doe, wheezing, orthopnea, PND, increased LE swelling, palpitations, dizziness or syncope.   Pt denies polydipsia, polyuria, or new focal neuro s/s.    Pt denies fever, wt loss, night sweats, loss of appetite, or other constitutional symptoms  BP has been controlled at home.         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   Anticardiolipin antibody positive    Anxiety    ANXIETY 07/19/2008   CEREBROVASCULAR ACCIDENT, HX OF 07/19/2008   CVA (cerebral vascular accident) (HCC) 2003   Depression    DEPRESSION 07/19/2008   Diabetes mellitus    type II   DIABETES MELLITUS, TYPE II 01/18/2008   Diverticulosis    GERD 07/19/2008   GERD (gastroesophageal reflux disease)    Hemorrhoids    Hx of adenomatous colonic polyps    Hyperlipidemia    HYPERLIPIDEMIA 07/19/2008   Hypertension    HYPERTENSION 07/31/2009   Increased prostate specific antigen (PSA) velocity 09/13/2010   Internal hemorrhoids    Mitral regurgitation    Echo 9/21: EF 55-60, no RWMA, normal RVSF, mild-moderate LAE, mild-moderate MR, mild aortic stenosis (mean gradient 9.2 mmHg)   Perineal abscess    Peripheral vascular disease (HCC)    peripheral vascular disease/carotid artery <100%   PERSONAL HX COLONIC POLYPS 12/10/2007   Unspecified Peripheral Vascular Disease 01/18/2008   Varicose veins    Past Surgical History:   Procedure Laterality Date   7 abdominal hernia repairs  05/31/10   ABDOMINAL AORTAGRAM N/A 01/12/2013   Procedure: ABDOMINAL EZELLA;  Surgeon: Gaile LELON New, MD;  Location: [facility] CATH LAB;  Service: Cardiovascular;  Laterality: N/A;   ABDOMINAL AORTIC ANEURYSM REPAIR     ABDOMINAL AORTOGRAM W/LOWER EXTREMITY Bilateral 10/05/2019   Procedure: ABDOMINAL AORTOGRAM W/LOWER EXTREMITY;  Surgeon: New Gaile LELON, MD;  Location: [facility] INVASIVE CV LAB;  Service: Cardiovascular;  Laterality: Bilateral;   COLONOSCOPY     hernia surgury     73 years old   perineal abscess     PERIPHERAL VASCULAR CATHETERIZATION N/A 11/23/2014   Procedure: Abdominal Aortogram;  Surgeon: Gaile LELON New, MD;  Location: [facility] INVASIVE CV LAB;  Service: Cardiovascular;  Laterality: N/A;   PERIPHERAL VASCULAR CATHETERIZATION  11/23/2014   Procedure: Lower Extremity Angiography;  Surgeon: Gaile LELON New, MD;  Location: [facility] INVASIVE CV LAB;  Service: Cardiovascular;;   PERIPHERAL VASCULAR CATHETERIZATION  11/23/2014   Procedure: Peripheral Vascular Intervention;  Surgeon: Gaile LELON New, MD;  Location: [facility] INVASIVE CV LAB;  Service: Cardiovascular;;  PTA bilateral common iliac PTA/DCB left fem-pop   POLYPECTOMY     s/p right and left popliteal aneurysm repair     SP ENDO REPAIR INFRAREN AAA  reports that he quit smoking about 31 years ago. His smoking use included cigarettes. He has never used smokeless tobacco. He reports current alcohol use of about 2.0 standard drinks of alcohol per week. He reports that he does not use drugs. family history includes Aortic aneurysm in his mother; Cancer in his brother. Allergies  Allergen Reactions   Latex    Latex Rash   Current Outpatient Medications on File Prior to Visit  Medication Sig Dispense Refill   amLODipine  (NORVASC ) 10 MG tablet Take 1 tablet (10 mg total) by mouth daily. 90 tablet 1   apixaban  (ELIQUIS ) 5 MG TABS tablet TAKE 1 TABLET TWICE A DAY 180 tablet 1   Ascorbic Acid  (VITAMIN C) 1000 MG tablet Take 1,000 mg by mouth daily.     aspirin EC 81 MG tablet Take 81 mg by mouth daily. Swallow whole.     atorvastatin  (LIPITOR) 40 MG tablet TAKE ONE TABLET BY MOUTH ONCE DAILY 90 tablet 2   cetirizine  (ZYRTEC ) 10 MG tablet Take 1 tablet (10 mg total) by mouth daily. 90 tablet 3   Cholecalciferol (VITAMIN D ) 50 MCG (2000 UT) tablet Take 2,000 Units by mouth in the morning and at bedtime.     docusate sodium  (COLACE) 100 MG capsule Take 100 mg by mouth 2 (two) times daily.     famotidine (PEPCID) 20 MG tablet Take 20 mg by mouth 2 (two) times daily.     FARXIGA  10 MG TABS tablet 10 mg daily.     folic acid  (FOLVITE ) 800 MCG tablet Take 800 mcg by mouth daily.     furosemide  (LASIX ) 40 MG tablet Take 40 mg by mouth daily.     gemfibrozil  (LOPID ) 600 MG tablet TAKE TWO TABLETS BY MOUTH ONCE DAILY 180 tablet 2   metFORMIN  (GLUCOPHAGE -XR) 500 MG 24 hr tablet TAKE 4 TABLETS BY MOUTH EVERY MORNING 360 tablet 1   olmesartan  (BENICAR ) 40 MG tablet Take 1 tablet (40 mg total) by mouth daily. Follow-up appt due in August must see provider for future refills 90 tablet 0   Omega-3 Fatty Acids (FISH OIL) 1000 MG CAPS Take 1,000 mg by mouth daily.     psyllium (FIBER LAXATIVE) 0.52 g capsule Take 3.36 g by mouth daily with lunch.     triamcinolone  (NASACORT ) 55 MCG/ACT AERO nasal inhaler Place 2 sprays into the nose daily. (Patient taking differently: Place 1 spray into the nose daily as needed (allergies).) 1 Inhaler 12   No current facility-administered medications on file prior to visit.        ROS:  All others reviewed and negative.  Objective        PE:  BP (!) 142/70   Pulse (!) 43   Temp 98.2 F (36.8 C)   Ht 5' 6 (1.676 m)   Wt 243 lb 12.8 oz (110.6 kg)   SpO2 97%   BMI 39.35 kg/m                 Constitutional: Pt appears in NAD               HENT: Head: NCAT.                Right Ear: External ear normal.                 Left Ear: External ear normal.                 Eyes: . Pupils are  equal, round, and reactive to light. Conjunctivae and EOM are normal               Nose: without d/c or deformity               Neck: Neck supple. Gross normal ROM               Cardiovascular: Normal rate and regular rhythm.                 Pulmonary/Chest: Effort normal and breath sounds without rales or wheezing.                Abd:  Soft, NT, ND, + BS, no organomegaly               Neurological: Pt is alert. At baseline orientation, motor grossly intact               Skin: Skin is warm. No rashes, no other new lesions, LE edema - none               Psychiatric: Pt behavior is normal without agitation   Micro: none  Cardiac tracings I have personally interpreted today:  none  Pertinent Radiological findings (summarize): none   Lab Results  Component Value Date   WBC 4.4 11/24/2023   HGB 7.5 (L) 11/24/2023   HCT 23.8 (L) 11/24/2023   PLT 247 11/24/2023   GLUCOSE 112 (H) 11/24/2023   CHOL 118 11/24/2023   TRIG 190.0 (H) 11/24/2023   HDL 30.10 (L) 11/24/2023   LDLDIRECT 64.0 05/24/2022   LDLCALC 50 11/24/2023   ALT 15 11/24/2023   AST 29 11/24/2023   NA 139 11/24/2023   K 4.4 11/24/2023   CL 99 11/24/2023   CREATININE 2.28 (H) 11/24/2023   BUN 49 (H) 11/24/2023   CO2 22 11/24/2023   TSH 1.86 05/27/2023   PSA 1.36 05/27/2023   INR 1.3 (H) 11/24/2023   HGBA1C 6.0 11/24/2023   MICROALBUR 3.9 (H) 03/16/2010   Assessment/Plan:  David [name] is a 73 y.o. 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) (09/07/2008), Anticardiolipin antibody positive, Anxiety, ANXIETY (07/19/2008), CEREBROVASCULAR ACCIDENT, HX OF (07/19/2008), CVA (cerebral vascular accident) (HCC) (2003), Depression, DEPRESSION (07/19/2008), Diabetes mellitus, DIABETES MELLITUS, TYPE II (01/18/2008), Diverticulosis, GERD (07/19/2008), GERD (gastroesophageal  reflux disease), Hemorrhoids, adenomatous colonic polyps, Hyperlipidemia, HYPERLIPIDEMIA (07/19/2008), Hypertension, HYPERTENSION (07/31/2009), Increased prostate specific antigen (PSA) velocity (09/13/2010), Internal hemorrhoids, Mitral regurgitation, Perineal abscess, Peripheral vascular disease (HCC), PERSONAL HX COLONIC POLYPS (12/10/2007), Unspecified Peripheral Vascular Disease (01/18/2008), and Varicose veins.  Diabetes (HCC) Lab Results  Component Value Date   HGBA1C 6.0 11/24/2023   Stable, pt to continue current medical treatment farxiga  10 every day, metformin  ER 500 mg - 4 qd   Essential hypertension BP Readings from Last 3 Encounters:  11/24/23 (!) 172/69  11/24/23 (!) 142/70  06/09/23 (!) 150/68   Uncontrolled, pt to continue medical treatment norvasc  10 every day, declines change other today   HLD (hyperlipidemia) Lab Results  Component Value Date   LDLCALC 50 11/24/2023   Stable, pt to continue current statin lipitor 40 qd  Followup: Return in about 6 months (around 05/26/2024).  Lynwood Rush, MD 11/24/2023 8:43 PM  Medical Group [facility] Internal Medicine

## 2023-11-24 NOTE — Assessment & Plan Note (Signed)
 BP Readings from Last 3 Encounters:  11/24/23 (!) 172/69  11/24/23 (!) 142/70  06/09/23 (!) 150/68   Uncontrolled, pt to continue medical treatment norvasc  10 every day, declines change other today

## 2023-11-24 NOTE — Assessment & Plan Note (Signed)
 Lab Results  Component Value Date   LDLCALC 50 11/24/2023   Stable, pt to continue current statin lipitor 40 qd

## 2023-11-24 NOTE — Progress Notes (Addendum)
 Type and screen collected by phlebotomist, patient resting in bed. Telemetry verified, vitals obtained.

## 2023-11-24 NOTE — ED Provider Notes (Signed)
 [place] EMERGENCY DEPARTMENT AT [facility] Provider Note   CSN: [id number] Arrival date & time: 11/24/23  [id number]     Patient presents with: Abnormal Lab (Hgn)   David [name] is a 73 y.o. male.   Pt is a 73 yo male with pmhx significant for diverticulosis, afib (on Eliquis ), hld, htn, cva, pvd, gerd, dm2, anxiety, depression, MR and benign colon polyps.  Pt went to his pcp today for a routine visit.  Blood work was done which showed a hgb of 7.5.  Pt was told to come to the ED for further eval.  Pt denies any sob.  He denies black stool.  Pt's last colonoscopy by Dr. Aneita was in November of 2023 and additional polyps were removed.  Pt had diverticulosis and internal hemorrhoids.        Prior to Admission medications   Medication Sig Start Date End Date Taking? Authorizing Provider  amLODipine  (NORVASC ) 10 MG tablet Take 1 tablet (10 mg total) by mouth daily. 10/23/23   Norleen Lynwood ORN, MD  apixaban  (ELIQUIS ) 5 MG TABS tablet TAKE 1 TABLET TWICE A DAY 07/08/23   Verlin Lonni BIRCH, MD  Ascorbic Acid (VITAMIN C) 1000 MG tablet Take 1,000 mg by mouth daily.    [provider]  aspirin EC 81 MG tablet Take 81 mg by mouth daily. Swallow whole.    [provider]  atorvastatin  (LIPITOR) 40 MG tablet TAKE ONE TABLET BY MOUTH ONCE DAILY 03/24/23   Norleen Lynwood ORN, MD  cetirizine  (ZYRTEC ) 10 MG tablet Take 1 tablet (10 mg total) by mouth daily. 08/18/14   Norleen Lynwood ORN, MD  Cholecalciferol (VITAMIN D ) 50 MCG (2000 UT) tablet Take 2,000 Units by mouth in the morning and at bedtime.    [provider]  docusate sodium  (COLACE) 100 MG capsule Take 100 mg by mouth 2 (two) times daily.    [provider]  famotidine (PEPCID) 20 MG tablet Take 20 mg by mouth 2 (two) times daily.    [provider]  FARXIGA  10 MG TABS tablet 10 mg daily. 04/20/22   [provider]  folic acid  (FOLVITE ) 800 MCG tablet Take 800 mcg by mouth daily.     [provider]  furosemide  (LASIX ) 40 MG tablet Take 40 mg by mouth daily.    [provider]  gemfibrozil  (LOPID ) 600 MG tablet TAKE TWO TABLETS BY MOUTH ONCE DAILY 03/24/23   Norleen Lynwood ORN, MD  metFORMIN  (GLUCOPHAGE -XR) 500 MG 24 hr tablet TAKE 4 TABLETS BY MOUTH EVERY MORNING 07/30/23   Norleen Lynwood ORN, MD  olmesartan  (BENICAR ) 40 MG tablet Take 1 tablet (40 mg total) by mouth daily. Follow-up appt due in August must see provider for future refills 10/05/18   Norleen Lynwood ORN, MD  Omega-3 Fatty Acids (FISH OIL) 1000 MG CAPS Take 1,000 mg by mouth daily.    [provider]  psyllium (FIBER LAXATIVE) 0.52 g capsule Take 3.36 g by mouth daily with lunch.    [provider]  triamcinolone  (NASACORT ) 55 MCG/ACT AERO nasal inhaler Place 2 sprays into the nose daily. Patient taking differently: Place 1 spray into the nose daily as needed (allergies). 06/18/17   Norleen Lynwood ORN, MD    Allergies: Latex and Latex    Review of Systems  All other systems reviewed and are negative.   Updated Vital Signs BP (!) 172/69   Pulse (!) 57   Temp 98.1 F (36.7 C)  Resp 15   SpO2 99%   Physical Exam Vitals and nursing note reviewed.  Constitutional:      Appearance: Normal appearance. He is obese.  HENT:     Head: Normocephalic and atraumatic.     Right Ear: External ear normal.     Left Ear: External ear normal.     Nose: Nose normal.     Mouth/Throat:     Mouth: Mucous membranes are dry.  Eyes:     Extraocular Movements: Extraocular movements intact.     Conjunctiva/sclera: Conjunctivae normal.     Pupils: Pupils are equal, round, and reactive to light.  Cardiovascular:     Rate and Rhythm: Bradycardia present. Rhythm irregular.     Pulses: Normal pulses.     Heart sounds: Normal heart sounds.  Pulmonary:     Effort: Pulmonary effort is normal.     Breath sounds: Normal breath sounds.  Abdominal:     General: Abdomen is flat. Bowel sounds are normal.      Palpations: Abdomen is soft.  Genitourinary:    Rectum: Guaiac result negative.     Comments: No stool in vault Musculoskeletal:        General: Normal range of motion.     Cervical back: Normal range of motion and neck supple.  Skin:    Capillary Refill: Capillary refill takes less than 2 seconds.  Neurological:     General: No focal deficit present.     Mental Status: He is alert and oriented to person, place, and time.  Psychiatric:        Mood and Affect: Mood normal.        Behavior: Behavior normal.     (all labs ordered are listed, but only abnormal results are displayed) Labs Reviewed  COMPREHENSIVE METABOLIC PANEL WITH GFR - Abnormal; Notable for the following components:      Result Value   Glucose, Bld 112 (*)    BUN 49 (*)    Creatinine, Ser 2.28 (*)    Calcium  11.6 (*)    GFR, Estimated 29 (*)    Anion gap 18 (*)    All other components within normal limits  CBC WITH DIFFERENTIAL/PLATELET - Abnormal; Notable for the following components:   RBC 2.76 (*)    Hemoglobin 7.5 (*)    HCT 23.8 (*)    All other components within normal limits  PROTIME-INR - Abnormal; Notable for the following components:   Prothrombin Time 16.5 (*)    INR 1.3 (*)    All other components within normal limits  RETICULOCYTES - Abnormal; Notable for the following components:   RBC. 2.72 (*)    Immature Retic Fract 25.7 (*)    All other components within normal limits  FERRITIN  OCCULT BLOOD X 1 CARD TO LAB, STOOL  VITAMIN B12  FOLATE  IRON  AND TIBC  TYPE AND SCREEN    EKG: EKG Interpretation Date/Time:  Monday November 24 2023 18:31:40 EDT Ventricular Rate:  53 PR Interval:    QRS Duration:  102 QT Interval:  445 QTC Calculation: 418 R Axis:   54  Text Interpretation: Atrial fibrillation Ventricular premature complex Consider anterior infarct No significant change since last tracing Confirmed by Dean Clarity [phone]) on 11/24/2023 7:55:17 PM  Radiology: [facility] Chest Port 1  View Result Date: 11/24/2023 CLINICAL DATA:  [id number] Anemia [id number] EXAM: PORTABLE CHEST 1 VIEW COMPARISON:  Chest x-ray 12/01/2020 FINDINGS: The heart and mediastinal contours are unchanged. Atherosclerotic plaque. Left base  opacity possibly due to patient rotation. Question pulmonary edema. No pleural effusion. No pneumothorax. No acute osseous abnormality. IMPRESSION: 1. Findings suggestive of pulmonary edema. 2. Left base opacity possibly due to patient rotation. 3. Recommend repeat chest x-ray PA and lateral view for further evaluation. 4.  Aortic Atherosclerosis (ICD10-I70.0). Electronically Signed   By: Morgane  Naveau M.D.   On: 11/24/2023 18:32     Procedures   Medications Ordered in the ED  pantoprazole  (PROTONIX ) injection 40 mg (40 mg Intravenous Given 11/24/23 1904)  sodium chloride  0.9 % bolus 500 mL (500 mLs Intravenous New Bag/Given 11/24/23 1907)                                    Medical Decision Making Amount and/or Complexity of Data Reviewed Labs: ordered. Radiology: ordered.  Risk Prescription drug management.   This patient presents to the ED for concern of anemia, this involves an extensive number of treatment options, and is a complaint that carries with it a high risk of complications and morbidity.  The differential diagnosis includes chronic disease, gi bleed, other bleeding source   Co morbidities that complicate the patient evaluation  diverticulosis, afib (on Eliquis ), hld, htn, cva, pvd, gerd, dm2, anxiety, depression, MR and benign colon polyps   Additional history obtained:  Additional history obtained from epic chart review External records from outside source obtained and reviewed including wife   Lab Tests:  I Ordered, and personally interpreted labs.  The pertinent results include:  cbc with hgb 7.5 (hgb 11.9 in January); cmp with bun 49 and cr 2.28 (31 and 1.57 in Jan)   Imaging Studies ordered:  I ordered imaging studies including cxr   I independently visualized and interpreted imaging which showed   Findings suggestive of pulmonary edema.  2. Left base opacity possibly due to patient rotation.  3. Recommend repeat chest x-ray PA and lateral view for further  evaluation.  4.  Aortic Atherosclerosis (ICD10-I70.0).   I agree with the radiologist interpretation   Cardiac Monitoring:  The patient was maintained on a cardiac monitor.  I personally viewed and interpreted the cardiac monitored which showed an underlying rhythm of: afib   Medicines ordered and prescription drug management:  I ordered medication including protonix   for suspected gi bleed  Reevaluation of the patient after these medicines showed that the patient improved I have reviewed the patients home medicines and have made adjustments as needed   Test Considered:  ct   Consultations Obtained:  I requested consultation with the hospitalist (Dr. Sundil),  and discussed lab and imaging findings as well as pertinent plan - she will admit.  She requests GI consult.  I did a secure chat with GI.  He can be seen in the am.   Problem List / ED Course:  Symptomatic anemia:  pt said he feels fine, but his O2 sat dropped to 80% with movement for rectal exam.  It came up quickly after he was settled and still.  He will need a blood transfusion which we don't have here. AKI:  gentle hydration ordered (500 cc).   Reevaluation:  After the interventions noted above, I reevaluated the patient and found that they have :improved   Social Determinants of Health:  Lives at home   Dispostion:  After consideration of the diagnostic results and the patients response to treatment, I feel that the patent would benefit from admission.  Final diagnoses:  Symptomatic anemia  AKI (acute kidney injury) [facility])    ED Discharge Orders     None          Dean Clarity, MD 11/24/23 2009

## 2023-11-24 NOTE — ED Notes (Signed)
Hemoccult card at the bedside  

## 2023-11-25 ENCOUNTER — Inpatient Hospital Stay (HOSPITAL_COMMUNITY)

## 2023-11-25 DIAGNOSIS — I350 Nonrheumatic aortic (valve) stenosis: Secondary | ICD-10-CM

## 2023-11-25 DIAGNOSIS — D509 Iron deficiency anemia, unspecified: Secondary | ICD-10-CM | POA: Diagnosis not present

## 2023-11-25 DIAGNOSIS — I714 Abdominal aortic aneurysm, without rupture, unspecified: Secondary | ICD-10-CM | POA: Diagnosis not present

## 2023-11-25 DIAGNOSIS — D649 Anemia, unspecified: Secondary | ICD-10-CM

## 2023-11-25 LAB — GLUCOSE, CAPILLARY
Glucose-Capillary: 118 mg/dL — ABNORMAL HIGH (ref 70–99)
Glucose-Capillary: 137 mg/dL — ABNORMAL HIGH (ref 70–99)
Glucose-Capillary: 115 mg/dL — ABNORMAL HIGH (ref 70–99)
Glucose-Capillary: 107 mg/dL — ABNORMAL HIGH (ref 70–99)

## 2023-11-25 LAB — CBC
HCT: 25 % — ABNORMAL LOW (ref 39.0–52.0)
Hemoglobin: 7.6 g/dL — ABNORMAL LOW (ref 13.0–17.0)
MCH: 27 pg (ref 26.0–34.0)
MCHC: 30.4 g/dL (ref 30.0–36.0)
MCV: 88.7 fL (ref 80.0–100.0)
Platelets: 222 K/uL (ref 150–400)
RBC: 2.82 MIL/uL — ABNORMAL LOW (ref 4.22–5.81)
RDW: 15.3 % (ref 11.5–15.5)
WBC: 5 K/uL (ref 4.0–10.5)
nRBC: 0 % (ref 0.0–0.2)

## 2023-11-25 LAB — ECHOCARDIOGRAM COMPLETE
AR max vel: 1.29 cm2
AV Area VTI: 1.32 cm2
AV Area mean vel: 1.25 cm2
AV Mean grad: 23 mmHg
AV Peak grad: 42.5 mmHg
Ao pk vel: 3.26 m/s
Area-P 1/2: 6.47 cm2
Est EF: 60
Height: 66 in
Weight: 3900.8 [oz_av]

## 2023-11-25 LAB — HEMOGLOBIN AND HEMATOCRIT, BLOOD
HCT: 26.1 % — ABNORMAL LOW (ref 39.0–52.0)
HCT: 26.2 % — ABNORMAL LOW (ref 39.0–52.0)
HCT: 25.4 % — ABNORMAL LOW (ref 39.0–52.0)
Hemoglobin: 8 g/dL — ABNORMAL LOW (ref 13.0–17.0)
Hemoglobin: 8.1 g/dL — ABNORMAL LOW (ref 13.0–17.0)
Hemoglobin: 7.6 g/dL — ABNORMAL LOW (ref 13.0–17.0)

## 2023-11-25 LAB — RETICULOCYTES
Immature Retic Fract: 28.6 % — ABNORMAL HIGH (ref 2.3–15.9)
RBC.: 2.81 MIL/uL — ABNORMAL LOW (ref 4.22–5.81)
Retic Count, Absolute: 62.4 K/uL (ref 19.0–186.0)
Retic Ct Pct: 2.2 % (ref 0.4–3.1)

## 2023-11-25 LAB — COMPREHENSIVE METABOLIC PANEL WITH GFR
ALT: 13 U/L (ref 0–44)
AST: 21 U/L (ref 15–41)
Albumin: 3.7 g/dL (ref 3.5–5.0)
Alkaline Phosphatase: 28 U/L — ABNORMAL LOW (ref 38–126)
Anion gap: 11 (ref 5–15)
BUN: 46 mg/dL — ABNORMAL HIGH (ref 8–23)
CO2: 23 mmol/L (ref 22–32)
Calcium: 10 mg/dL (ref 8.9–10.3)
Chloride: 103 mmol/L (ref 98–111)
Creatinine, Ser: 2.08 mg/dL — ABNORMAL HIGH (ref 0.61–1.24)
GFR, Estimated: 33 mL/min — ABNORMAL LOW (ref >60)
Glucose, Bld: 117 mg/dL — ABNORMAL HIGH (ref 70–99)
Potassium: 3.9 mmol/L (ref 3.5–5.1)
Sodium: 137 mmol/L (ref 135–145)
Total Bilirubin: 0.6 mg/dL (ref 0.0–1.2)
Total Protein: 7.5 g/dL (ref 6.5–8.1)

## 2023-11-25 LAB — LACTATE DEHYDROGENASE: LDH: 132 U/L (ref 98–192)

## 2023-11-25 LAB — IRON AND TIBC
Iron: 26 ug/dL — ABNORMAL LOW (ref 45–182)
Saturation Ratios: 6 % — ABNORMAL LOW (ref 17.9–39.5)
TIBC: 463 ug/dL — ABNORMAL HIGH (ref 250–450)
UIBC: 437 ug/dL

## 2023-11-25 LAB — FERRITIN: Ferritin: 13 ng/mL — ABNORMAL LOW (ref 24–336)

## 2023-11-25 LAB — FOLATE
Folate: 38 ng/mL (ref >5.9)
Folate: 35.9 ng/mL (ref >5.9)

## 2023-11-25 LAB — D-DIMER, QUANTITATIVE: D-Dimer, Quant: 1.56 ug{FEU}/mL — ABNORMAL HIGH (ref 0.00–0.50)

## 2023-11-25 LAB — VITAMIN B12: Vitamin B-12: 717 pg/mL (ref 180–914)

## 2023-11-25 MED ORDER — SODIUM CHLORIDE 0.9 % IV SOLN: INTRAVENOUS | Status: AC

## 2023-11-25 MED ORDER — PERFLUTREN LIPID MICROSPHERE
1.0000 mL | INTRAVENOUS | Status: AC | PRN
  Administered 2023-11-25: 3 mL via INTRAVENOUS

## 2023-11-25 MED ORDER — PANTOPRAZOLE SODIUM 40 MG PO TBEC
40.0000 mg | DELAYED_RELEASE_TABLET | Freq: Every day | ORAL | Status: DC
  Administered 2023-11-26: 40 mg via ORAL
  Filled 2023-11-25: qty 1

## 2023-11-25 MED ORDER — ACETAMINOPHEN 325 MG PO TABS: 650.0000 mg | ORAL_TABLET | Freq: Four times a day (QID) | ORAL | Status: DC | PRN

## 2023-11-25 MED ORDER — FERROUS GLUCONATE 324 (38 FE) MG PO TABS
324.0000 mg | ORAL_TABLET | Freq: Two times a day (BID) | ORAL | Status: DC
  Administered 2023-11-25 – 2023-11-26 (×2): 324 mg via ORAL
  Filled 2023-11-25 (×3): qty 1

## 2023-11-25 MED ORDER — ORAL CARE MOUTH RINSE: 15.0000 mL | OROMUCOSAL | Status: DC | PRN

## 2023-11-25 MED ORDER — ALBUTEROL SULFATE (2.5 MG/3ML) 0.083% IN NEBU: 2.5000 mg | INHALATION_SOLUTION | RESPIRATORY_TRACT | Status: DC | PRN

## 2023-11-25 MED ORDER — ONDANSETRON HCL 4 MG PO TABS: 4.0000 mg | ORAL_TABLET | Freq: Four times a day (QID) | ORAL | Status: DC | PRN

## 2023-11-25 MED ORDER — ACETAMINOPHEN 650 MG RE SUPP: 650.0000 mg | Freq: Four times a day (QID) | RECTAL | Status: DC | PRN

## 2023-11-25 MED ORDER — IRON SUCROSE 200 MG IVPB - SIMPLE MED
200.0000 mg | Freq: Once | Status: AC
  Administered 2023-11-25: 200 mg via INTRAVENOUS
  Filled 2023-11-25: qty 200

## 2023-11-25 MED ORDER — ONDANSETRON HCL 4 MG/2ML IJ SOLN: 4.0000 mg | Freq: Four times a day (QID) | INTRAMUSCULAR | Status: DC | PRN

## 2023-11-25 NOTE — Anesthesia Preprocedure Evaluation (Signed)
 Anesthesia Evaluation  Patient identified by MRN, date of birth, ID band Patient awake    Reviewed: Allergy & Precautions, NPO status , Patient's Chart, lab work & pertinent test results  History of Anesthesia Complications Negative for: history of anesthetic complications  Airway Mallampati: II  TM Distance: >3 FB Neck ROM: Full    Dental no notable dental hx. (+) Teeth Intact, Dental Advisory Given   Pulmonary former smoker   Pulmonary exam normal breath sounds clear to auscultation       Cardiovascular hypertension, Pt. on medications (-) angina + Peripheral Vascular Disease  (-) Past MI Normal cardiovascular exam+ Valvular Problems/Murmurs MR  Rhythm:Regular Rate:Normal     Neuro/Psych  PSYCHIATRIC DISORDERS Anxiety Depression    CVA (2003), No Residual Symptoms    GI/Hepatic ,GERD  Medicated and Controlled,,  Endo/Other  diabetes, Type 2    Renal/GU      Musculoskeletal   Abdominal   Peds  Hematology   Anesthesia Other Findings All: latex  Reproductive/Obstetrics                              Anesthesia Physical Anesthesia Plan  ASA: 3  Anesthesia Plan: MAC   Post-op Pain Management: Minimal or no pain anticipated   Induction: Intravenous  PONV Risk Score and Plan: Propofol  infusion and Treatment may vary due to age or medical condition  Airway Management Planned: Natural Airway and Nasal Cannula  Additional Equipment: None  Intra-op Plan:   Post-operative Plan: Extubation in OR  Informed Consent: I have reviewed the patients History and Physical, chart, labs and discussed the procedure including the risks, benefits and alternatives for the proposed anesthesia with the patient or authorized representative who has indicated his/her understanding and acceptance.     Dental advisory given  Plan Discussed with: CRNA and Surgeon  Anesthesia Plan Comments:           Anesthesia Quick Evaluation

## 2023-11-25 NOTE — H&P (View-Only) (Signed)
 Referring Provider: Dr. True Atlas Primary Care Physician:  Norleen Lynwood ORN, MD Primary Gastroenterologist: Previously Dr. Aneita  Reason for Consultation: Iron  deficiency anemia  HPI: David [name] is a 73 y.o. male with a past medical history of anxiety, depression, hypertension, hyperlipidemia, MR, mild AS, AAA s/p repair 2012, atrial fibrillation on Eliquis , CVA 2003, diabetes mellitus type 2, CKD, GERD, diverticulosis and colon polyps.  Patient underwent routine laboratory studies by his PCP on 11/25/2023 which showed a hemoglobin level of 7.5 therefore he was sent directly to the ED for further evaluation.  Labs in the ED showed a white blood cell count of 4.4.  Hemoglobin 7.5 down from 11.9 six months ago.  Hematocrit 23.8.  Platelets 247.  Sodium 139.  Potassium 4.4. BUN 49 ( BUN 31 six months ago). Creatinine 2.28 . Calcium  11.6.  Total bili 0.3.  Alk phos 39.  AST 29.  ALT 15.  INR 1.3.  Vitamin B12 level 823.  Iron  33.  TIBC 529.  Saturation ratio 6.  Ferritin 25.  Reticulocyte count 2.23.  FOBT negative.  Labs today: Hemoglobin 7.6.  B12 level 717.  Iron  26.  Saturation ratio 6.  Ferritin 13.  D-dimer 1.56. Repeat hemoglobin 8.0 at 9:55 AM.  A GI consult was requested for further evaluation regarding iron  deficiency anemia.  He denies having any nausea or vomiting.  No heartburn or dysphagia.  No upper or lower abdominal pain.  Bowel movements are normal, no bloody or black stools. He has a history of mild GERD for which he takes Famotidine 20 mg twice daily for the past 10+ years.  Never had an EGD.  History of colon polyps.  His most recent colonoscopy 03/2022 identified 4 tubular adenomatous polyps removed from the colon.  A repeat colonoscopy was recommended in 3 years.  He takes ASA 81 mg daily.  Last dose of Eliquis  was 7 AM on 11/24/2023.  No known family history of upper GI/colorectal cancer, celiac disease or IBD.  No chest pain or shortness of breath.  Wife at the  bedside.  ECHO 07/24/2022: Left ventricular ejection fraction, by estimation, is 55 to 60%. The left ventricle has normal function. Left ventricular endocardial border not optimally defined to evaluate regional wall motion. There is mild left ventricular hypertrophy of the basal-septal segment. Left ventricular diastolic function could not be evaluated. 1. Right ventricular systolic function is normal. The right ventricular size is mildly enlarged. There is normal pulmonary artery systolic pressure. 2. 3. Left atrial size was severely dilated. 4. Right atrial size was moderately dilated. 5. The mitral valve is normal in structure. Mild to moderate mitral valve regurgitation. The aortic valve is tricuspid. There is moderate calcification of the aortic valve. There is moderate thickening of the aortic valve. Aortic valve regurgitation is not visualized. Mild aortic valve stenosis. 6. 7. There is mild dilatation of the ascending aorta, measuring 40 mm.  GI PROCEDURES:  Colonoscopy 03/26/2022:  - Four 6 to 7 mm polyps in the transverse colon and in the ascending colon, removed with a cold snare. Resected and retrieved.  - Moderate diverticulosis in the entire examined colon, more concentrated in the left colon. - Internal hemorrhoids - Recall colonoscopy 3 years - TUBULAR ADENOMA(S) (4 FRAGMENTS) - NEGATIVE FOR HIGH-GRADE DYSPLASIA OR MALIGNANCY - SEE NOTE  Colonoscopy 10/20/2014: 1. Two sessile polyps in the sigmoid colon and transverse colon; polypectomies performed with a cold snare  2. Moderate diverticulosis in the descending colon and sigmoid colon  3. Mild diverticulosis in the transverse colon  4. Grade Il internal hemorrhoids and moderate external hemorrhoids HYPERPLASTIC POLYP(S). 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    Anxiety    ANXIETY 07/19/2008   CEREBROVASCULAR ACCIDENT, HX OF 07/19/2008   CVA (cerebral vascular accident) (HCC) 2003   Depression    DEPRESSION 07/19/2008   Diabetes mellitus    type II   DIABETES MELLITUS, TYPE II 01/18/2008   Diverticulosis    GERD 07/19/2008   GERD (gastroesophageal reflux disease)    Hemorrhoids    Hx of adenomatous colonic polyps    Hyperlipidemia    HYPERLIPIDEMIA 07/19/2008   Hypertension    HYPERTENSION 07/31/2009   Increased prostate specific antigen (PSA) velocity 09/13/2010   Internal hemorrhoids    Mitral regurgitation    Echo 9/21: EF 55-60, no RWMA, normal RVSF, mild-moderate LAE, mild-moderate MR, mild aortic stenosis (mean gradient 9.2 mmHg)   Perineal abscess    Peripheral vascular disease (HCC)    peripheral vascular disease/carotid artery <100%   PERSONAL HX COLONIC POLYPS 12/10/2007   Unspecified Peripheral Vascular Disease 01/18/2008   Varicose veins     Past Surgical History:  Procedure Laterality Date   7 abdominal hernia repairs  05/31/10   ABDOMINAL AORTAGRAM N/A 01/12/2013   Procedure: ABDOMINAL EZELLA;  Surgeon: Gaile LELON New, MD;  Location: [facility] CATH LAB;  Service: Cardiovascular;  Laterality: N/A;   ABDOMINAL AORTIC ANEURYSM REPAIR     ABDOMINAL AORTOGRAM W/LOWER EXTREMITY Bilateral 10/05/2019   Procedure: ABDOMINAL AORTOGRAM W/LOWER EXTREMITY;  Surgeon: New Gaile LELON, MD;  Location: [facility] INVASIVE CV LAB;  Service: Cardiovascular;  Laterality: Bilateral;   COLONOSCOPY     hernia surgury     73 years old   perineal abscess     PERIPHERAL VASCULAR CATHETERIZATION N/A 11/23/2014   Procedure: Abdominal Aortogram;  Surgeon: Gaile LELON New, MD;  Location: [facility] INVASIVE CV LAB;  Service: Cardiovascular;  Laterality: N/A;   PERIPHERAL VASCULAR CATHETERIZATION  11/23/2014   Procedure: Lower Extremity Angiography;  Surgeon: Gaile LELON New, MD;  Location: [facility] INVASIVE CV LAB;  Service: Cardiovascular;;    PERIPHERAL VASCULAR CATHETERIZATION  11/23/2014   Procedure: Peripheral Vascular Intervention;  Surgeon: Gaile LELON New, MD;  Location: [facility] INVASIVE CV LAB;  Service: Cardiovascular;;  PTA bilateral common iliac PTA/DCB left fem-pop   POLYPECTOMY     s/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 Authorizing Provider  amLODipine  (NORVASC ) 10 MG tablet Take 1 tablet (10 mg total) by mouth daily. 10/23/23  Yes Norleen Lynwood LELON, MD  apixaban  (ELIQUIS ) 5 MG TABS tablet TAKE 1 TABLET TWICE A DAY 07/08/23  Yes Verlin Lonni BIRCH, MD  Ascorbic Acid (VITAMIN C) 1000 MG tablet Take 1,000 mg by mouth daily.   Yes [provider]  aspirin EC 81 MG tablet Take 81 mg by mouth daily. Swallow whole.   Yes [provider]  atorvastatin  (LIPITOR) 40 MG tablet TAKE ONE TABLET BY MOUTH ONCE DAILY Patient taking differently: Take 40 mg by mouth at bedtime. 03/24/23  Yes Norleen Lynwood LELON, MD  cetirizine  (ZYRTEC ) 10 MG tablet Take 1 tablet (  10 mg total) by mouth daily. 08/18/14  Yes Norleen Lynwood ORN, MD  Cholecalciferol (VITAMIN D ) 50 MCG (2000 UT) tablet Take 2,000 Units by mouth in the morning and at bedtime.   Yes [provider]  docusate sodium  (COLACE) 100 MG capsule Take 100 mg by mouth 2 (two) times daily.   Yes [provider]  famotidine (PEPCID) 20 MG tablet Take 20 mg by mouth 2 (two) times daily.   Yes [provider]  FARXIGA  10 MG TABS tablet Take 10 mg by mouth daily. 04/20/22  Yes [provider]  folic acid  (FOLVITE ) 800 MCG tablet Take 800 mcg by mouth daily.   Yes [provider]  furosemide  (LASIX ) 40 MG tablet Take 40 mg by mouth daily.   Yes [provider]  gemfibrozil  (LOPID ) 600 MG tablet TAKE TWO TABLETS BY MOUTH ONCE DAILY Patient taking differently: Take 600 mg by mouth in the morning and at bedtime. 03/24/23  Yes  Norleen Lynwood ORN, MD  metFORMIN  (GLUCOPHAGE -XR) 500 MG 24 hr tablet TAKE 4 TABLETS BY MOUTH EVERY MORNING Patient taking differently: Take 2,000 mg by mouth daily with breakfast. 07/30/23  Yes Norleen Lynwood ORN, MD  olmesartan  (BENICAR ) 40 MG tablet Take 1 tablet (40 mg total) by mouth daily. Follow-up appt due in August must see provider for future refills 10/05/18  Yes Norleen Lynwood ORN, MD  Omega-3 Fatty Acids (FISH OIL) 1000 MG CAPS Take 1,000 mg by mouth daily.   Yes [provider]  psyllium (FIBER LAXATIVE) 0.52 g capsule Take 3.36 g by mouth daily with lunch.   Yes [provider]  triamcinolone  (NASACORT ) 55 MCG/ACT AERO nasal inhaler Place 2 sprays into the nose daily. Patient taking differently: Place 1 spray into the nose daily as needed (allergies). 06/18/17  Yes Norleen Lynwood ORN, MD    Current Facility-Administered Medications  Medication Dose Route Frequency Provider Last Rate Last Admin   0.9 %  sodium chloride  infusion   Intravenous Continuous Debby Camila LABOR, MD   Paused at 11/25/23 1050   acetaminophen  (TYLENOL ) tablet 650 mg  650 mg Oral Q6H PRN Debby Camila LABOR, MD       Or   acetaminophen  (TYLENOL ) suppository 650 mg  650 mg Rectal Q6H PRN Debby Camila LABOR, MD       albuterol  (PROVENTIL ) (2.5 MG/3ML) 0.083% nebulizer solution 2.5 mg  2.5 mg Nebulization Q2H PRN Debby Camila LABOR, MD       amLODipine  (NORVASC ) tablet 10 mg  10 mg Oral Daily Sundil, Subrina, MD   10 mg at 11/25/23 9047   atorvastatin  (LIPITOR) tablet 40 mg  40 mg Oral Daily Sundil, Subrina, MD   40 mg at 11/25/23 9047   ferrous gluconate  (FERGON) tablet 324 mg  324 mg Oral BID WC Rizwan, Saima, MD       hydrALAZINE  (APRESOLINE ) injection 10 mg  10 mg Intravenous Q6H PRN Sundil, Subrina, MD       insulin  aspart (novoLOG ) injection 0-5 Units  0-5 Units Subcutaneous QHS Sundil, Subrina, MD       insulin  aspart (novoLOG ) injection 0-6 Units  0-6 Units Subcutaneous TID WC PRN Sundil, Subrina, MD        ondansetron  (ZOFRAN ) tablet 4 mg  4 mg Oral Q6H PRN Debby Camila LABOR, MD       Or   ondansetron  (ZOFRAN ) injection 4 mg  4 mg Intravenous Q6H PRN Debby Camila LABOR, MD       Oral care  mouth rinse  15 mL Mouth Rinse PRN Sundil, Subrina, MD        Allergies as of 11/24/2023 - Review Complete 11/24/2023  Allergen Reaction Noted   Latex  12/01/2020   Latex Rash 08/03/2010    Family History  Problem Relation Age of Onset   Aortic aneurysm Mother        desceding aoritic aneurysm   Cancer Brother        Lung cancer   Colon cancer Neg Hx    Esophageal cancer Neg Hx    Stomach cancer Neg Hx    Rectal cancer Neg Hx     Social History   Socioeconomic History   Marital status: Married    Spouse name: Not on file   Number of children: 1   Years of education: Not on file   Highest education level: Not on file  Occupational History   Occupation: dsiabled due to stroke    Employer: RETIRED  Tobacco Use   Smoking status: Former    Current packs/day: 0.00    Types: Cigarettes    Quit date: 01/14/1992    Years since quitting: 31.8   Smokeless tobacco: Never  Vaping Use   Vaping status: Never Used  Substance and Sexual Activity   Alcohol use: Yes    Alcohol/week: 2.0 standard drinks of alcohol    Types: 2 Glasses of wine per week    Comment: wine daily   Drug use: No   Sexual activity: Not on file  Other Topics Concern   Not on file  Social History Narrative   2 cups of coffee in the morning and tea throughout the day.   Social Drivers of Health   Financial Resource Strain: Patient Declined (11/20/2023)   Overall Financial Resource Strain (CARDIA)    Difficulty of Paying Living Expenses: Patient declined  Food Insecurity: No Food Insecurity (11/24/2023)   Hunger Vital Sign    Worried About Running Out of Food in the Last Year: Never true    Ran Out of Food in the Last Year: Never true  Transportation Needs: No Transportation Needs (11/24/2023)   PRAPARE -  Administrator, Civil Service (Medical): No    Lack of Transportation (Non-Medical): No  Physical Activity: Insufficiently Active (11/20/2023)   Exercise Vital Sign    Days of Exercise per Week: 3 days    Minutes of Exercise per Session: 30 min  Stress: No Stress Concern Present (11/20/2023)   [employer] of Occupational Health - Occupational Stress Questionnaire    Feeling of Stress: Not at all  Social Connections: Moderately Isolated (11/24/2023)   Social Connection and Isolation Panel    Frequency of Communication with Friends and Family: Once a week    Frequency of Social Gatherings with Friends and Family: Once a week    Attends Religious Services: Never    Database administrator or Organizations: Yes    Attends Banker Meetings: Never    Marital Status: Married  Catering manager Violence: Not At Risk (11/24/2023)   Humiliation, Afraid, Rape, and Kick questionnaire    Fear of Current or Ex-Partner: No    Emotionally Abused: No    Physically Abused: No    Sexually Abused: No   Review of Systems: Gen: Denies fever, sweats or chills. No weight loss.  CV: Denies chest pain, palpitations or edema. Resp: Denies cough, shortness of breath of hemoptysis.  GI:See HPI.   GU : Denies urinary burning, blood  in urine, increased urinary frequency or incontinence. MS: Denies joint pain, muscles aches or weakness. Derm: Denies rash, itchiness, skin lesions or unhealing ulcers. Psych: Denies depression, anxiety, memory loss or confusion. Heme: Denies easy bruising, bleeding. Neuro:  Denies headaches, dizziness or paresthesias. Endo: + DM type II.   Physical Exam: Vital signs in last 24 hours: Temp:  [97.7 F (36.5 C)-98.3 F (36.8 C)] 97.7 F (36.5 C) (07/22 1040) Pulse Rate:  [25-122] 47 (07/22 1040) Resp:  [15-24] 20 (07/22 1040) BP: (149-173)/(64-79) 165/71 (07/22 0526) SpO2:  [95 %-100 %] 100 % (07/22 1040) Weight:  [888 kg] 111 kg (07/22 0957) Last  BM Date : 11/24/23 General:  Alert 73 year old male ambulating in his room. In no acute distress. Head:  Normocephalic and atraumatic. Eyes:  No scleral icterus. Conjunctiva pink. Ears:  Normal auditory acuity. Nose:  No deformity, discharge or lesions. Mouth:  Dentition intact. No ulcers or lesions.  Neck:  Supple. No lymphadenopathy or thyromegaly.  Lungs: Breath sounds clear throughout. No wheezes, rhonchi or crackles.  Heart: Irregular rhythm, soft systolic murmur. Abdomen: Obese abdomen, nontender.  Nondistended.  Positive bowel sounds to all 4 quadrants.  No palpable or splenomegaly.  No palpable mass Rectal: Deferred. Musculoskeletal:  Symmetrical without gross deformities.  Pulses:  Normal pulses noted. Extremities: Trace lower extremity edema.  Compression socks in use. Neurologic:  Alert and  oriented x 4. No focal deficits.  Skin:  Intact without significant lesions or rashes. Psych:  Alert and cooperative. Normal mood and affect.  Intake/Output from previous day: 07/21 0701 - 07/22 0700 In: 610.4 [P.O.:120; IV Piggyback:490.4] Out: -  Intake/Output this shift: Total I/O In: 240 [P.O.:240] Out: -   Lab Results: Recent Labs    11/24/23 0944 11/24/23 1718 11/25/23 0609 11/25/23 0955  WBC 4.1 4.4 5.0  --   HGB 7.5 Repeated and verified X2.* 7.5* 7.6* 8.0*  HCT 22.9 Repeated and verified X2.* 23.8* 25.0* 26.1*  PLT 220.0 247 222  --    BMET Recent Labs    11/24/23 0944 11/24/23 1718 11/25/23 0609  NA 136 139 137  K 4.6 4.4 3.9  CL 101 99 103  CO2 24 22 23   GLUCOSE 139* 112* 117*  BUN 46* 49* 46*  CREATININE 2.20* 2.28* 2.08*  CALCIUM  10.6* 11.6* 10.0   LFT Recent Labs    11/24/23 0944 11/24/23 1718 11/25/23 0609  PROT 7.7   < > 7.5  ALBUMIN 4.3   < > 3.7  AST 20   < > 21  ALT 11   < > 13  ALKPHOS 30*   < > 28*  BILITOT 0.4   < > 0.6  BILIDIR 0.1  --   --    < > = values in this interval not displayed.   PT/INR Recent Labs     11/24/23 1718  LABPROT 16.5*  INR 1.3*   Hepatitis Panel No results for input(s): HEPBSAG, HCVAB, HEPAIGM, HEPBIGM in the last 72 hours.   Studies/Results: ECHOCARDIOGRAM COMPLETE Result Date: 11/25/2023    ECHOCARDIOGRAM REPORT   Patient Name:   ADHRIT KRENZ Date of Exam: 11/25/2023 Medical Rec #:  [id number]       Height:       66.0 in Accession #:    [id number]      Weight:       243.8 lb Date of Birth:  June 20, 1950       BSA:  2.175 m Patient Age:    73 years        BP:           165/71 mmHg Patient Gender: M               HR:           83 bpm. Exam Location:  Inpatient Procedure: 2D Echo, Cardiac Doppler, Color Doppler and Intracardiac            Opacification Agent (Both Spectral and Color Flow Doppler were            utilized during procedure). Indications:    Mitral Valve Disorder  History:        Patient has prior history of Echocardiogram examinations, most                 recent 07/24/2022. Risk Factors:Hypertension, Diabetes and                 Dyslipidemia.  Sonographer:    Philomena Daring Referring Phys: [id number] SARA-MAIZ A THOMAS IMPRESSIONS  1. Left ventricular ejection fraction, by estimation, is 60%. The left ventricle has normal function. The left ventricle has no regional wall motion abnormalities. Left ventricular diastolic parameters are indeterminate.  2. Right ventricular systolic function is normal. The right ventricular size is severely enlarged. There is normal pulmonary artery systolic pressure.  3. Left atrial size was moderately dilated.  4. Right atrial size was severely dilated.  5. The mitral valve is normal in structure. Trivial mitral valve regurgitation. No evidence of mitral stenosis.  6. The aortic valve is tricuspid. There is mild calcification of the aortic valve. Aortic valve regurgitation is not visualized. Mild to moderate aortic valve stenosis. Aortic valve mean gradient measures 23.0 mmHg. Aortic valve Vmax measures 3.26 m/s.  7. Aortic dilatation  noted. There is mild dilatation of the ascending aorta, measuring 40 mm.  8. The inferior vena cava is dilated in size with <50% respiratory variability, suggesting right atrial pressure of 15 mmHg. Comparison(s): Prior images reviewed side by side. Mitral regurgitation has improved. Aortic stenosis gradients higher in this study. This study is technically more challening than prior. FINDINGS  Left Ventricle: Left ventricular ejection fraction, by estimation, is 60%. The left ventricle has normal function. The left ventricle has no regional wall motion abnormalities. Definity  contrast agent was given IV to delineate the left ventricular endocardial borders. The left ventricular internal cavity size was normal in size. Suboptimal image quality limits for assessment of left ventricular hypertrophy. Left ventricular diastolic parameters are indeterminate. Right Ventricle: The right ventricular size is severely enlarged. No increase in right ventricular wall thickness. Right ventricular systolic function is normal. There is normal pulmonary artery systolic pressure. The tricuspid regurgitant velocity is 2.13 m/s, and with an assumed right atrial pressure of 15 mmHg, the estimated right ventricular systolic pressure is 33.1 mmHg. Left Atrium: Left atrial size was moderately dilated. Right Atrium: Right atrial size was severely dilated. Pericardium: There is no evidence of pericardial effusion. Mitral Valve: The mitral valve is normal in structure. Trivial mitral valve regurgitation. No evidence of mitral valve stenosis. Tricuspid Valve: The tricuspid valve is normal in structure. Tricuspid valve regurgitation is not demonstrated. No evidence of tricuspid stenosis. Aortic Valve: The aortic valve is tricuspid. There is mild calcification of the aortic valve. Aortic valve regurgitation is not visualized. Mild to moderate aortic stenosis is present. Aortic valve mean gradient measures 23.0 mmHg. Aortic valve peak gradient  measures 42.5  mmHg. Aortic valve area, by VTI measures 1.32 cm. Pulmonic Valve: The pulmonic valve was normal in structure. Pulmonic valve regurgitation is not visualized. No evidence of pulmonic stenosis. Aorta: Aortic dilatation noted. There is mild dilatation of the ascending aorta, measuring 40 mm. Venous: The inferior vena cava is dilated in size with less than 50% respiratory variability, suggesting right atrial pressure of 15 mmHg. IAS/Shunts: The atrial septum is grossly normal.  LEFT VENTRICLE PLAX 2D LVOT diam:     2.20 cm   Diastology LV SV:         97        LV e' medial:    7.51 cm/s LV SV Index:   45        LV E/e' medial:  19.8 LVOT Area:     3.80 cm  LV e' lateral:   10.40 cm/s                          LV E/e' lateral: 14.3  RIGHT VENTRICLE            IVC RV S prime:     8.38 cm/s  IVC diam: 2.50 cm TAPSE (M-mode): 1.6 cm LEFT ATRIUM              Index        RIGHT ATRIUM           Index LA Vol (A2C):   103.0 ml 47.35 ml/m  RA Area:     30.80 cm LA Vol (A4C):   79.4 ml  36.50 ml/m  RA Volume:   119.00 ml 54.70 ml/m LA Biplane Vol: 92.2 ml  42.38 ml/m  AORTIC VALVE AV Area (Vmax):    1.29 cm AV Area (Vmean):   1.25 cm AV Area (VTI):     1.32 cm AV Vmax:           326.00 cm/s AV Vmean:          210.500 cm/s AV VTI:            0.736 m AV Peak Grad:      42.5 mmHg AV Mean Grad:      23.0 mmHg LVOT Vmax:         111.00 cm/s LVOT Vmean:        69.300 cm/s LVOT VTI:          0.256 m LVOT/AV VTI ratio: 0.35  AORTA Ao Root diam: 2.90 cm Ao Asc diam:  4.00 cm MITRAL VALVE                TRICUSPID VALVE MV Area (PHT): 6.47 cm     TR Peak grad:   18.1 mmHg MV E velocity: 149.00 cm/s  TR Vmax:        213.00 cm/s                              SHUNTS                             Systemic VTI:  0.26 m                             Systemic Diam: 2.20 cm Stanly Leavens MD Electronically signed by Stanly Leavens MD Signature Date/Time: 11/25/2023/9:19:27 AM    Final  DG Chest Port 1 View Result  Date: 11/24/2023 CLINICAL DATA:  [id number] Anemia [id number] EXAM: PORTABLE CHEST 1 VIEW COMPARISON:  Chest x-ray 12/01/2020 FINDINGS: The heart and mediastinal contours are unchanged. Atherosclerotic plaque. Left base opacity possibly due to patient rotation. Question pulmonary edema. No pleural effusion. No pneumothorax. No acute osseous abnormality. IMPRESSION: 1. Findings suggestive of pulmonary edema. 2. Left base opacity possibly due to patient rotation. 3. Recommend repeat chest x-ray PA and lateral view for further evaluation. 4.  Aortic Atherosclerosis (ICD10-I70.0). Electronically Signed   By: Morgane  Naveau M.D.   On: 11/24/2023 18:32    IMPRESSION/PLAN:  73 year old male presents with acute on chronic anemia.  Admission iron  panel consistent with IDA.  Hg 7.5 -> 8.0. FOBT negative.  No overt GI bleeding.  Received IV iron  x 1.  Started on Ferrous gluconate  324 mg daily.  Hemodynamically stable. - Continue heart healthy diet for now - CBC in a.m. - Transfuse for hemoglobin level < 8 - Consider EGD +/- colonoscopy during this hospitalization, defer endoscopic recommendations to Dr. Stacia  History of GERD, asymptomatic. Patient takes Famotidine 20 mg twice daily at home.  Never had an EGD. - Pantoprazole  40 mg po QD  History of colon polyps. Colonoscopy 03/2022 identified 4 tubular adenomatous polyps removed from the colon.  Atrial fibrillation on Eliquis . CHA2DS2-VASc Score = 6.  Last dose of Eliquis  was 7 AM on 7/21. - Continue to hold Eliquis   MR, mild AS. ECHO completed today, results pending   Past CVA  Diabetes mellitus type 2  CKD      Elida HERO Kennedy-Smith  11/25/2023, 12:10  PM

## 2023-11-25 NOTE — Consult Note (Signed)
 Referring Provider: Dr. True Atlas Primary Care Physician:  Norleen Lynwood ORN, MD Primary Gastroenterologist: Previously Dr. Aneita  Reason for Consultation: Iron  deficiency anemia  HPI: David [name] is a 73 y.o. male with a past medical history of anxiety, depression, hypertension, hyperlipidemia, MR, mild AS, AAA s/p repair 2012, atrial fibrillation on Eliquis , CVA 2003, diabetes mellitus type 2, CKD, GERD, diverticulosis and colon polyps.  Patient underwent routine laboratory studies by his PCP on 11/25/2023 which showed a hemoglobin level of 7.5 therefore he was sent directly to the ED for further evaluation.  Labs in the ED showed a white blood cell count of 4.4.  Hemoglobin 7.5 down from 11.9 six months ago.  Hematocrit 23.8.  Platelets 247.  Sodium 139.  Potassium 4.4. BUN 49 ( BUN 31 six months ago). Creatinine 2.28 . Calcium  11.6.  Total bili 0.3.  Alk phos 39.  AST 29.  ALT 15.  INR 1.3.  Vitamin B12 level 823.  Iron  33.  TIBC 529.  Saturation ratio 6.  Ferritin 25.  Reticulocyte count 2.23.  FOBT negative.  Labs today: Hemoglobin 7.6.  B12 level 717.  Iron  26.  Saturation ratio 6.  Ferritin 13.  D-dimer 1.56. Repeat hemoglobin 8.0 at 9:55 AM.  A GI consult was requested for further evaluation regarding iron  deficiency anemia.  He denies having any nausea or vomiting.  No heartburn or dysphagia.  No upper or lower abdominal pain.  Bowel movements are normal, no bloody or black stools. He has a history of mild GERD for which he takes Famotidine 20 mg twice daily for the past 10+ years.  Never had an EGD.  History of colon polyps.  His most recent colonoscopy 03/2022 identified 4 tubular adenomatous polyps removed from the colon.  A repeat colonoscopy was recommended in 3 years.  He takes ASA 81 mg daily.  Last dose of Eliquis  was 7 AM on 11/24/2023.  No known family history of upper GI/colorectal cancer, celiac disease or IBD.  No chest pain or shortness of breath.  Wife at the  bedside.  ECHO 07/24/2022: Left ventricular ejection fraction, by estimation, is 55 to 60%. The left ventricle has normal function. Left ventricular endocardial border not optimally defined to evaluate regional wall motion. There is mild left ventricular hypertrophy of the basal-septal segment. Left ventricular diastolic function could not be evaluated. 1. Right ventricular systolic function is normal. The right ventricular size is mildly enlarged. There is normal pulmonary artery systolic pressure. 2. 3. Left atrial size was severely dilated. 4. Right atrial size was moderately dilated. 5. The mitral valve is normal in structure. Mild to moderate mitral valve regurgitation. The aortic valve is tricuspid. There is moderate calcification of the aortic valve. There is moderate thickening of the aortic valve. Aortic valve regurgitation is not visualized. Mild aortic valve stenosis. 6. 7. There is mild dilatation of the ascending aorta, measuring 40 mm.  GI PROCEDURES:  Colonoscopy 03/26/2022:  - Four 6 to 7 mm polyps in the transverse colon and in the ascending colon, removed with a cold snare. Resected and retrieved.  - Moderate diverticulosis in the entire examined colon, more concentrated in the left colon. - Internal hemorrhoids - Recall colonoscopy 3 years - TUBULAR ADENOMA(S) (4 FRAGMENTS) - NEGATIVE FOR HIGH-GRADE DYSPLASIA OR MALIGNANCY - SEE NOTE  Colonoscopy 10/20/2014: 1. Two sessile polyps in the sigmoid colon and transverse colon; polypectomies performed with a cold snare  2. Moderate diverticulosis in the descending colon and sigmoid colon  3. Mild diverticulosis in the transverse colon  4. Grade Il internal hemorrhoids and moderate external hemorrhoids HYPERPLASTIC POLYP(S). 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    Anxiety    ANXIETY 07/19/2008   CEREBROVASCULAR ACCIDENT, HX OF 07/19/2008   CVA (cerebral vascular accident) (HCC) 2003   Depression    DEPRESSION 07/19/2008   Diabetes mellitus    type II   DIABETES MELLITUS, TYPE II 01/18/2008   Diverticulosis    GERD 07/19/2008   GERD (gastroesophageal reflux disease)    Hemorrhoids    Hx of adenomatous colonic polyps    Hyperlipidemia    HYPERLIPIDEMIA 07/19/2008   Hypertension    HYPERTENSION 07/31/2009   Increased prostate specific antigen (PSA) velocity 09/13/2010   Internal hemorrhoids    Mitral regurgitation    Echo 9/21: EF 55-60, no RWMA, normal RVSF, mild-moderate LAE, mild-moderate MR, mild aortic stenosis (mean gradient 9.2 mmHg)   Perineal abscess    Peripheral vascular disease (HCC)    peripheral vascular disease/carotid artery <100%   PERSONAL HX COLONIC POLYPS 12/10/2007   Unspecified Peripheral Vascular Disease 01/18/2008   Varicose veins     Past Surgical History:  Procedure Laterality Date   7 abdominal hernia repairs  05/31/10   ABDOMINAL AORTAGRAM N/A 01/12/2013   Procedure: ABDOMINAL EZELLA;  Surgeon: Gaile LELON New, MD;  Location: [facility] CATH LAB;  Service: Cardiovascular;  Laterality: N/A;   ABDOMINAL AORTIC ANEURYSM REPAIR     ABDOMINAL AORTOGRAM W/LOWER EXTREMITY Bilateral 10/05/2019   Procedure: ABDOMINAL AORTOGRAM W/LOWER EXTREMITY;  Surgeon: New Gaile LELON, MD;  Location: [facility] INVASIVE CV LAB;  Service: Cardiovascular;  Laterality: Bilateral;   COLONOSCOPY     hernia surgury     73 years old   perineal abscess     PERIPHERAL VASCULAR CATHETERIZATION N/A 11/23/2014   Procedure: Abdominal Aortogram;  Surgeon: Gaile LELON New, MD;  Location: [facility] INVASIVE CV LAB;  Service: Cardiovascular;  Laterality: N/A;   PERIPHERAL VASCULAR CATHETERIZATION  11/23/2014   Procedure: Lower Extremity Angiography;  Surgeon: Gaile LELON New, MD;  Location: [facility] INVASIVE CV LAB;  Service: Cardiovascular;;    PERIPHERAL VASCULAR CATHETERIZATION  11/23/2014   Procedure: Peripheral Vascular Intervention;  Surgeon: Gaile LELON New, MD;  Location: [facility] INVASIVE CV LAB;  Service: Cardiovascular;;  PTA bilateral common iliac PTA/DCB left fem-pop   POLYPECTOMY     s/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 Authorizing Provider  amLODipine  (NORVASC ) 10 MG tablet Take 1 tablet (10 mg total) by mouth daily. 10/23/23  Yes Norleen Lynwood LELON, MD  apixaban  (ELIQUIS ) 5 MG TABS tablet TAKE 1 TABLET TWICE A DAY 07/08/23  Yes Verlin Lonni BIRCH, MD  Ascorbic Acid (VITAMIN C) 1000 MG tablet Take 1,000 mg by mouth daily.   Yes [provider]  aspirin EC 81 MG tablet Take 81 mg by mouth daily. Swallow whole.   Yes [provider]  atorvastatin  (LIPITOR) 40 MG tablet TAKE ONE TABLET BY MOUTH ONCE DAILY Patient taking differently: Take 40 mg by mouth at bedtime. 03/24/23  Yes Norleen Lynwood LELON, MD  cetirizine  (ZYRTEC ) 10 MG tablet Take 1 tablet (  10 mg total) by mouth daily. 08/18/14  Yes Norleen Lynwood ORN, MD  Cholecalciferol (VITAMIN D ) 50 MCG (2000 UT) tablet Take 2,000 Units by mouth in the morning and at bedtime.   Yes [provider]  docusate sodium  (COLACE) 100 MG capsule Take 100 mg by mouth 2 (two) times daily.   Yes [provider]  famotidine (PEPCID) 20 MG tablet Take 20 mg by mouth 2 (two) times daily.   Yes [provider]  FARXIGA  10 MG TABS tablet Take 10 mg by mouth daily. 04/20/22  Yes [provider]  folic acid  (FOLVITE ) 800 MCG tablet Take 800 mcg by mouth daily.   Yes [provider]  furosemide  (LASIX ) 40 MG tablet Take 40 mg by mouth daily.   Yes [provider]  gemfibrozil  (LOPID ) 600 MG tablet TAKE TWO TABLETS BY MOUTH ONCE DAILY Patient taking differently: Take 600 mg by mouth in the morning and at bedtime. 03/24/23  Yes  Norleen Lynwood ORN, MD  metFORMIN  (GLUCOPHAGE -XR) 500 MG 24 hr tablet TAKE 4 TABLETS BY MOUTH EVERY MORNING Patient taking differently: Take 2,000 mg by mouth daily with breakfast. 07/30/23  Yes Norleen Lynwood ORN, MD  olmesartan  (BENICAR ) 40 MG tablet Take 1 tablet (40 mg total) by mouth daily. Follow-up appt due in August must see provider for future refills 10/05/18  Yes Norleen Lynwood ORN, MD  Omega-3 Fatty Acids (FISH OIL) 1000 MG CAPS Take 1,000 mg by mouth daily.   Yes [provider]  psyllium (FIBER LAXATIVE) 0.52 g capsule Take 3.36 g by mouth daily with lunch.   Yes [provider]  triamcinolone  (NASACORT ) 55 MCG/ACT AERO nasal inhaler Place 2 sprays into the nose daily. Patient taking differently: Place 1 spray into the nose daily as needed (allergies). 06/18/17  Yes Norleen Lynwood ORN, MD    Current Facility-Administered Medications  Medication Dose Route Frequency Provider Last Rate Last Admin   0.9 %  sodium chloride  infusion   Intravenous Continuous Debby Camila LABOR, MD   Paused at 11/25/23 1050   acetaminophen  (TYLENOL ) tablet 650 mg  650 mg Oral Q6H PRN Debby Camila LABOR, MD       Or   acetaminophen  (TYLENOL ) suppository 650 mg  650 mg Rectal Q6H PRN Debby Camila LABOR, MD       albuterol  (PROVENTIL ) (2.5 MG/3ML) 0.083% nebulizer solution 2.5 mg  2.5 mg Nebulization Q2H PRN Debby Camila LABOR, MD       amLODipine  (NORVASC ) tablet 10 mg  10 mg Oral Daily Sundil, Subrina, MD   10 mg at 11/25/23 9047   atorvastatin  (LIPITOR) tablet 40 mg  40 mg Oral Daily Sundil, Subrina, MD   40 mg at 11/25/23 9047   ferrous gluconate  (FERGON) tablet 324 mg  324 mg Oral BID WC Rizwan, Saima, MD       hydrALAZINE  (APRESOLINE ) injection 10 mg  10 mg Intravenous Q6H PRN Sundil, Subrina, MD       insulin  aspart (novoLOG ) injection 0-5 Units  0-5 Units Subcutaneous QHS Sundil, Subrina, MD       insulin  aspart (novoLOG ) injection 0-6 Units  0-6 Units Subcutaneous TID WC PRN Sundil, Subrina, MD        ondansetron  (ZOFRAN ) tablet 4 mg  4 mg Oral Q6H PRN Debby Camila LABOR, MD       Or   ondansetron  (ZOFRAN ) injection 4 mg  4 mg Intravenous Q6H PRN Debby Camila LABOR, MD       Oral care  mouth rinse  15 mL Mouth Rinse PRN Sundil, Subrina, MD        Allergies as of 11/24/2023 - Review Complete 11/24/2023  Allergen Reaction Noted   Latex  12/01/2020   Latex Rash 08/03/2010    Family History  Problem Relation Age of Onset   Aortic aneurysm Mother        desceding aoritic aneurysm   Cancer Brother        Lung cancer   Colon cancer Neg Hx    Esophageal cancer Neg Hx    Stomach cancer Neg Hx    Rectal cancer Neg Hx     Social History   Socioeconomic History   Marital status: Married    Spouse name: Not on file   Number of children: 1   Years of education: Not on file   Highest education level: Not on file  Occupational History   Occupation: dsiabled due to stroke    Employer: RETIRED  Tobacco Use   Smoking status: Former    Current packs/day: 0.00    Types: Cigarettes    Quit date: 01/14/1992    Years since quitting: 31.8   Smokeless tobacco: Never  Vaping Use   Vaping status: Never Used  Substance and Sexual Activity   Alcohol use: Yes    Alcohol/week: 2.0 standard drinks of alcohol    Types: 2 Glasses of wine per week    Comment: wine daily   Drug use: No   Sexual activity: Not on file  Other Topics Concern   Not on file  Social History Narrative   2 cups of coffee in the morning and tea throughout the day.   Social Drivers of Health   Financial Resource Strain: Patient Declined (11/20/2023)   Overall Financial Resource Strain (CARDIA)    Difficulty of Paying Living Expenses: Patient declined  Food Insecurity: No Food Insecurity (11/24/2023)   Hunger Vital Sign    Worried About Running Out of Food in the Last Year: Never true    Ran Out of Food in the Last Year: Never true  Transportation Needs: No Transportation Needs (11/24/2023)   PRAPARE -  Administrator, Civil Service (Medical): No    Lack of Transportation (Non-Medical): No  Physical Activity: Insufficiently Active (11/20/2023)   Exercise Vital Sign    Days of Exercise per Week: 3 days    Minutes of Exercise per Session: 30 min  Stress: No Stress Concern Present (11/20/2023)   [employer] of Occupational Health - Occupational Stress Questionnaire    Feeling of Stress: Not at all  Social Connections: Moderately Isolated (11/24/2023)   Social Connection and Isolation Panel    Frequency of Communication with Friends and Family: Once a week    Frequency of Social Gatherings with Friends and Family: Once a week    Attends Religious Services: Never    Database administrator or Organizations: Yes    Attends Banker Meetings: Never    Marital Status: Married  Catering manager Violence: Not At Risk (11/24/2023)   Humiliation, Afraid, Rape, and Kick questionnaire    Fear of Current or Ex-Partner: No    Emotionally Abused: No    Physically Abused: No    Sexually Abused: No   Review of Systems: Gen: Denies fever, sweats or chills. No weight loss.  CV: Denies chest pain, palpitations or edema. Resp: Denies cough, shortness of breath of hemoptysis.  GI:See HPI.   GU : Denies urinary burning, blood  in urine, increased urinary frequency or incontinence. MS: Denies joint pain, muscles aches or weakness. Derm: Denies rash, itchiness, skin lesions or unhealing ulcers. Psych: Denies depression, anxiety, memory loss or confusion. Heme: Denies easy bruising, bleeding. Neuro:  Denies headaches, dizziness or paresthesias. Endo: + DM type II.   Physical Exam: Vital signs in last 24 hours: Temp:  [97.7 F (36.5 C)-98.3 F (36.8 C)] 97.7 F (36.5 C) (07/22 1040) Pulse Rate:  [25-122] 47 (07/22 1040) Resp:  [15-24] 20 (07/22 1040) BP: (149-173)/(64-79) 165/71 (07/22 0526) SpO2:  [95 %-100 %] 100 % (07/22 1040) Weight:  [888 kg] 111 kg (07/22 0957) Last  BM Date : 11/24/23 General:  Alert 73 year old male ambulating in his room. In no acute distress. Head:  Normocephalic and atraumatic. Eyes:  No scleral icterus. Conjunctiva pink. Ears:  Normal auditory acuity. Nose:  No deformity, discharge or lesions. Mouth:  Dentition intact. No ulcers or lesions.  Neck:  Supple. No lymphadenopathy or thyromegaly.  Lungs: Breath sounds clear throughout. No wheezes, rhonchi or crackles.  Heart: Irregular rhythm, soft systolic murmur. Abdomen: Obese abdomen, nontender.  Nondistended.  Positive bowel sounds to all 4 quadrants.  No palpable or splenomegaly.  No palpable mass Rectal: Deferred. Musculoskeletal:  Symmetrical without gross deformities.  Pulses:  Normal pulses noted. Extremities: Trace lower extremity edema.  Compression socks in use. Neurologic:  Alert and  oriented x 4. No focal deficits.  Skin:  Intact without significant lesions or rashes. Psych:  Alert and cooperative. Normal mood and affect.  Intake/Output from previous day: 07/21 0701 - 07/22 0700 In: 610.4 [P.O.:120; IV Piggyback:490.4] Out: -  Intake/Output this shift: Total I/O In: 240 [P.O.:240] Out: -   Lab Results: Recent Labs    11/24/23 0944 11/24/23 1718 11/25/23 0609 11/25/23 0955  WBC 4.1 4.4 5.0  --   HGB 7.5 Repeated and verified X2.* 7.5* 7.6* 8.0*  HCT 22.9 Repeated and verified X2.* 23.8* 25.0* 26.1*  PLT 220.0 247 222  --    BMET Recent Labs    11/24/23 0944 11/24/23 1718 11/25/23 0609  NA 136 139 137  K 4.6 4.4 3.9  CL 101 99 103  CO2 24 22 23   GLUCOSE 139* 112* 117*  BUN 46* 49* 46*  CREATININE 2.20* 2.28* 2.08*  CALCIUM  10.6* 11.6* 10.0   LFT Recent Labs    11/24/23 0944 11/24/23 1718 11/25/23 0609  PROT 7.7   < > 7.5  ALBUMIN 4.3   < > 3.7  AST 20   < > 21  ALT 11   < > 13  ALKPHOS 30*   < > 28*  BILITOT 0.4   < > 0.6  BILIDIR 0.1  --   --    < > = values in this interval not displayed.   PT/INR Recent Labs     11/24/23 1718  LABPROT 16.5*  INR 1.3*   Hepatitis Panel No results for input(s): HEPBSAG, HCVAB, HEPAIGM, HEPBIGM in the last 72 hours.   Studies/Results: ECHOCARDIOGRAM COMPLETE Result Date: 11/25/2023    ECHOCARDIOGRAM REPORT   Patient Name:   David [name] Date of Exam: 11/25/2023 Medical Rec #:  [id number]       Height:       66.0 in Accession #:    [id number]      Weight:       243.8 lb Date of Birth:  June 20, 1950       BSA:  2.175 m Patient Age:    73 years        BP:           165/71 mmHg Patient Gender: M               HR:           83 bpm. Exam Location:  Inpatient Procedure: 2D Echo, Cardiac Doppler, Color Doppler and Intracardiac            Opacification Agent (Both Spectral and Color Flow Doppler were            utilized during procedure). Indications:    Mitral Valve Disorder  History:        Patient has prior history of Echocardiogram examinations, most                 recent 07/24/2022. Risk Factors:Hypertension, Diabetes and                 Dyslipidemia.  Sonographer:    Philomena Daring Referring Phys: [id number] SARA-MAIZ A THOMAS IMPRESSIONS  1. Left ventricular ejection fraction, by estimation, is 60%. The left ventricle has normal function. The left ventricle has no regional wall motion abnormalities. Left ventricular diastolic parameters are indeterminate.  2. Right ventricular systolic function is normal. The right ventricular size is severely enlarged. There is normal pulmonary artery systolic pressure.  3. Left atrial size was moderately dilated.  4. Right atrial size was severely dilated.  5. The mitral valve is normal in structure. Trivial mitral valve regurgitation. No evidence of mitral stenosis.  6. The aortic valve is tricuspid. There is mild calcification of the aortic valve. Aortic valve regurgitation is not visualized. Mild to moderate aortic valve stenosis. Aortic valve mean gradient measures 23.0 mmHg. Aortic valve Vmax measures 3.26 m/s.  7. Aortic dilatation  noted. There is mild dilatation of the ascending aorta, measuring 40 mm.  8. The inferior vena cava is dilated in size with <50% respiratory variability, suggesting right atrial pressure of 15 mmHg. Comparison(s): Prior images reviewed side by side. Mitral regurgitation has improved. Aortic stenosis gradients higher in this study. This study is technically more challening than prior. FINDINGS  Left Ventricle: Left ventricular ejection fraction, by estimation, is 60%. The left ventricle has normal function. The left ventricle has no regional wall motion abnormalities. Definity  contrast agent was given IV to delineate the left ventricular endocardial borders. The left ventricular internal cavity size was normal in size. Suboptimal image quality limits for assessment of left ventricular hypertrophy. Left ventricular diastolic parameters are indeterminate. Right Ventricle: The right ventricular size is severely enlarged. No increase in right ventricular wall thickness. Right ventricular systolic function is normal. There is normal pulmonary artery systolic pressure. The tricuspid regurgitant velocity is 2.13 m/s, and with an assumed right atrial pressure of 15 mmHg, the estimated right ventricular systolic pressure is 33.1 mmHg. Left Atrium: Left atrial size was moderately dilated. Right Atrium: Right atrial size was severely dilated. Pericardium: There is no evidence of pericardial effusion. Mitral Valve: The mitral valve is normal in structure. Trivial mitral valve regurgitation. No evidence of mitral valve stenosis. Tricuspid Valve: The tricuspid valve is normal in structure. Tricuspid valve regurgitation is not demonstrated. No evidence of tricuspid stenosis. Aortic Valve: The aortic valve is tricuspid. There is mild calcification of the aortic valve. Aortic valve regurgitation is not visualized. Mild to moderate aortic stenosis is present. Aortic valve mean gradient measures 23.0 mmHg. Aortic valve peak gradient  measures 42.5  mmHg. Aortic valve area, by VTI measures 1.32 cm. Pulmonic Valve: The pulmonic valve was normal in structure. Pulmonic valve regurgitation is not visualized. No evidence of pulmonic stenosis. Aorta: Aortic dilatation noted. There is mild dilatation of the ascending aorta, measuring 40 mm. Venous: The inferior vena cava is dilated in size with less than 50% respiratory variability, suggesting right atrial pressure of 15 mmHg. IAS/Shunts: The atrial septum is grossly normal.  LEFT VENTRICLE PLAX 2D LVOT diam:     2.20 cm   Diastology LV SV:         97        LV e' medial:    7.51 cm/s LV SV Index:   45        LV E/e' medial:  19.8 LVOT Area:     3.80 cm  LV e' lateral:   10.40 cm/s                          LV E/e' lateral: 14.3  RIGHT VENTRICLE            IVC RV S prime:     8.38 cm/s  IVC diam: 2.50 cm TAPSE (M-mode): 1.6 cm LEFT ATRIUM              Index        RIGHT ATRIUM           Index LA Vol (A2C):   103.0 ml 47.35 ml/m  RA Area:     30.80 cm LA Vol (A4C):   79.4 ml  36.50 ml/m  RA Volume:   119.00 ml 54.70 ml/m LA Biplane Vol: 92.2 ml  42.38 ml/m  AORTIC VALVE AV Area (Vmax):    1.29 cm AV Area (Vmean):   1.25 cm AV Area (VTI):     1.32 cm AV Vmax:           326.00 cm/s AV Vmean:          210.500 cm/s AV VTI:            0.736 m AV Peak Grad:      42.5 mmHg AV Mean Grad:      23.0 mmHg LVOT Vmax:         111.00 cm/s LVOT Vmean:        69.300 cm/s LVOT VTI:          0.256 m LVOT/AV VTI ratio: 0.35  AORTA Ao Root diam: 2.90 cm Ao Asc diam:  4.00 cm MITRAL VALVE                TRICUSPID VALVE MV Area (PHT): 6.47 cm     TR Peak grad:   18.1 mmHg MV E velocity: 149.00 cm/s  TR Vmax:        213.00 cm/s                              SHUNTS                             Systemic VTI:  0.26 m                             Systemic Diam: 2.20 cm Stanly Leavens MD Electronically signed by Stanly Leavens MD Signature Date/Time: 11/25/2023/9:19:27 AM    Final  DG Chest Port 1 View Result  Date: 11/24/2023 CLINICAL DATA:  [id number] Anemia [id number] EXAM: PORTABLE CHEST 1 VIEW COMPARISON:  Chest x-ray 12/01/2020 FINDINGS: The heart and mediastinal contours are unchanged. Atherosclerotic plaque. Left base opacity possibly due to patient rotation. Question pulmonary edema. No pleural effusion. No pneumothorax. No acute osseous abnormality. IMPRESSION: 1. Findings suggestive of pulmonary edema. 2. Left base opacity possibly due to patient rotation. 3. Recommend repeat chest x-ray PA and lateral view for further evaluation. 4.  Aortic Atherosclerosis (ICD10-I70.0). Electronically Signed   By: Morgane  Naveau M.D.   On: 11/24/2023 18:32    IMPRESSION/PLAN:  73 year old male presents with acute on chronic anemia.  Admission iron  panel consistent with IDA.  Hg 7.5 -> 8.0. FOBT negative.  No overt GI bleeding.  Received IV iron  x 1.  Started on Ferrous gluconate  324 mg daily.  Hemodynamically stable. - Continue heart healthy diet for now - CBC in a.m. - Transfuse for hemoglobin level < 8 - Consider EGD +/- colonoscopy during this hospitalization, defer endoscopic recommendations to Dr. Stacia  History of GERD, asymptomatic. Patient takes Famotidine 20 mg twice daily at home.  Never had an EGD. - Pantoprazole  40 mg po QD  History of colon polyps. Colonoscopy 03/2022 identified 4 tubular adenomatous polyps removed from the colon.  Atrial fibrillation on Eliquis . CHA2DS2-VASc Score = 6.  Last dose of Eliquis  was 7 AM on 7/21. - Continue to hold Eliquis   MR, mild AS. ECHO completed today, results pending   Past CVA  Diabetes mellitus type 2  CKD      Elida HERO Kennedy-Smith  11/25/2023, 12:10  PM

## 2023-11-25 NOTE — Progress Notes (Signed)
 Patient resting comfortably in bed. IV fluids started per orders. Patient voiding, denies shortness or breath or fatigue at this time. No dyspnea with exertion or ambulation. He does appear pale and noted to have low HR at rest. MD aware

## 2023-11-25 NOTE — H&P (Addendum)
 History and Physical    David [name] FMW:[id number] DOB: 12-Aug-1950 DOA: 11/24/2023  PCP: Norleen Lynwood ORN, MD  Patient coming from:   I have personally briefly reviewed patient's old medical records in [facility]  Chief Complaint: abn lab noting drop in hgb t o 7.5  HPI: David [name] is a 73 y.o. male with medical history significant of  AAA,Anxiety, CVA , Depression, DMII,GERD,Depression, hemorrhoids, HTN ,PVD,MR, hx of colonic polyps,who presents to ED due to abnormal lab noted on routine check. Patient notes no fatigue, presyncope, chest pain, confusion , or sob. He also denies any dark stools , blood in stools, hematemesis, or nose bleeding. He notes no nsaid or anticoagulant use.   ED Course:  Afeb, bp 142/70 hr 43, sat 97% on ra  Hgb 7.5 from 11.9 MCV83.5, retic 2.7 Inr 1.3 Na 136, K 4.6, glu 139, cr 2.2 base 1.5  Vit D 39.5     Review of Systems: As per HPI otherwise 10 point review of systems negative.   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   Anticardiolipin antibody positive    Anxiety    ANXIETY 07/19/2008   CEREBROVASCULAR ACCIDENT, HX OF 07/19/2008   CVA (cerebral vascular accident) (HCC) 2003   Depression    DEPRESSION 07/19/2008   Diabetes mellitus    type II   DIABETES MELLITUS, TYPE II 01/18/2008   Diverticulosis    GERD 07/19/2008   GERD (gastroesophageal reflux disease)    Hemorrhoids    Hx of adenomatous colonic polyps    Hyperlipidemia    HYPERLIPIDEMIA 07/19/2008   Hypertension    HYPERTENSION 07/31/2009   Increased prostate specific antigen (PSA) velocity 09/13/2010   Internal hemorrhoids    Mitral regurgitation    Echo 9/21: EF 55-60, no RWMA, normal RVSF, mild-moderate LAE, mild-moderate MR, mild aortic stenosis (mean gradient 9.2 mmHg)   Perineal abscess    Peripheral vascular disease (HCC)    peripheral  vascular disease/carotid artery <100%   PERSONAL HX COLONIC POLYPS 12/10/2007   Unspecified Peripheral Vascular Disease 01/18/2008   Varicose veins     Past Surgical History:  Procedure Laterality Date   7 abdominal hernia repairs  05/31/10   ABDOMINAL AORTAGRAM N/A 01/12/2013   Procedure: ABDOMINAL EZELLA;  Surgeon: Gaile ORN New, MD;  Location: [facility] CATH LAB;  Service: Cardiovascular;  Laterality: N/A;   ABDOMINAL AORTIC ANEURYSM REPAIR     ABDOMINAL AORTOGRAM W/LOWER EXTREMITY Bilateral 10/05/2019   Procedure: ABDOMINAL AORTOGRAM W/LOWER EXTREMITY;  Surgeon: New Gaile ORN, MD;  Location: [facility] INVASIVE CV LAB;  Service: Cardiovascular;  Laterality: Bilateral;   COLONOSCOPY     hernia surgury     73 years old   perineal abscess     PERIPHERAL VASCULAR CATHETERIZATION N/A 11/23/2014   Procedure: Abdominal Aortogram;  Surgeon: Gaile ORN New, MD;  Location: [facility] INVASIVE CV LAB;  Service: Cardiovascular;  Laterality: N/A;   PERIPHERAL VASCULAR CATHETERIZATION  11/23/2014   Procedure: Lower Extremity Angiography;  Surgeon: Gaile ORN New, MD;  Location: [facility] INVASIVE CV LAB;  Service: Cardiovascular;;   PERIPHERAL VASCULAR CATHETERIZATION  11/23/2014   Procedure: Peripheral Vascular Intervention;  Surgeon: Gaile ORN New, MD;  Location: [facility] INVASIVE CV LAB;  Service: Cardiovascular;;  PTA bilateral common iliac PTA/DCB left fem-pop   POLYPECTOMY     s/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 His smoking use included cigarettes. He has never used smokeless tobacco. He reports current alcohol use of about 2.0 standard drinks of alcohol per week. He reports that he does not use drugs.  Allergies  Allergen Reactions   Latex    Latex Rash    Family History  Problem Relation Age of Onset   Aortic aneurysm Mother        desceding aoritic aneurysm   Cancer Brother        Lung cancer   Colon cancer Neg Hx     Esophageal cancer Neg Hx    Stomach cancer Neg Hx    Rectal cancer Neg Hx     Prior to Admission medications   Medication Sig Start Date End Date Taking? Authorizing Provider  amLODipine  (NORVASC ) 10 MG tablet Take 1 tablet (10 mg total) by mouth daily. 10/23/23   Norleen Lynwood ORN, MD  apixaban  (ELIQUIS ) 5 MG TABS tablet TAKE 1 TABLET TWICE A DAY 07/08/23   Verlin Lonni BIRCH, MD  Ascorbic Acid (VITAMIN C) 1000 MG tablet Take 1,000 mg by mouth daily.    [provider]  aspirin EC 81 MG tablet Take 81 mg by mouth daily. Swallow whole.    [provider]  atorvastatin  (LIPITOR) 40 MG tablet TAKE ONE TABLET BY MOUTH ONCE DAILY 03/24/23   Norleen Lynwood ORN, MD  cetirizine  (ZYRTEC ) 10 MG tablet Take 1 tablet (10 mg total) by mouth daily. 08/18/14   Norleen Lynwood ORN, MD  Cholecalciferol (VITAMIN D ) 50 MCG (2000 UT) tablet Take 2,000 Units by mouth in the morning and at bedtime.    [provider]  docusate sodium  (COLACE) 100 MG capsule Take 100 mg by mouth 2 (two) times daily.    [provider]  famotidine (PEPCID) 20 MG tablet Take 20 mg by mouth 2 (two) times daily.    [provider]  FARXIGA  10 MG TABS tablet 10 mg daily. 04/20/22   [provider]  folic acid  (FOLVITE ) 800 MCG tablet Take 800 mcg by mouth daily.    [provider]  furosemide  (LASIX ) 40 MG tablet Take 40 mg by mouth daily.    [provider]  gemfibrozil  (LOPID ) 600 MG tablet TAKE TWO TABLETS BY MOUTH ONCE DAILY 03/24/23   Norleen Lynwood ORN, MD  metFORMIN  (GLUCOPHAGE -XR) 500 MG 24 hr tablet TAKE 4 TABLETS BY MOUTH EVERY MORNING 07/30/23   Norleen Lynwood ORN, MD  olmesartan  (BENICAR ) 40 MG tablet Take 1 tablet (40 mg total) by mouth daily. Follow-up appt due in August must see provider for future refills 10/05/18   Norleen Lynwood ORN, MD  Omega-3 Fatty Acids (FISH OIL) 1000 MG CAPS Take 1,000 mg by mouth daily.    [provider]  psyllium (FIBER LAXATIVE) 0.52 g  capsule Take 3.36 g by mouth daily with lunch.    [provider]  triamcinolone  (NASACORT ) 55 MCG/ACT AERO nasal inhaler Place 2 sprays into the nose daily. Patient taking differently: Place 1 spray into the nose daily as needed (allergies). 06/18/17   Norleen Lynwood ORN, MD    Physical Exam: Vitals:   11/24/23 2116 11/24/23 2117 11/24/23 2213 11/25/23 0158  BP:   (!) 149/78 (!) 154/65  Pulse: (!) 52  65 (!) 41  Resp: 16  18 18   Temp:  98 F (36.7 C)  98.3 F (36.8 C) 98.1 F (36.7 C)  TempSrc:  Oral Oral Oral  SpO2: 100%  98% 97%    Constitutional: NAD, calm, comfortable Vitals:   11/24/23 2116 11/24/23 2117 11/24/23 2213 11/25/23 0158  BP:   (!) 149/78 (!) 154/65  Pulse: (!) 52  65 (!) 41  Resp: 16  18 18   Temp:  98 F (36.7 C) 98.3 F (36.8 C) 98.1 F (36.7 C)  TempSrc:  Oral Oral Oral  SpO2: 100%  98% 97%   Eyes: pupils are equal, lids and conjunctivae normal ENMT: Mucous membranes are moist.  Neck: normal, supple, no masses, no thyromegaly Respiratory: clear to auscultation bilaterally, rhonchi on right chest, no wheezing, no crackles. Normal respiratory effort. No accessory muscle use.  Cardiovascular: Regular rate and rhythm, no murmurs / rubs / gallops. No extremity edema. 2+ pedal pulses.   Abdomen: no tenderness, no masses palpated. No hepatosplenomegaly. Bowel sounds positive.  Musculoskeletal: no clubbing / cyanosis. No joint deformity upper and lower extremities. Good ROM, no contractures. Normal muscle tone.  Skin: no rashes, lesions, ulcers. No induration Neurologic: CN grossly intact. Sensation intact,  Strength 5/5 in all 4.  Psychiatric: Normal judgment and insight. Alert and oriented x 3. Normal mood.    Labs on Admission: I have personally reviewed following labs and imaging studies  CBC: Recent Labs  Lab 11/24/23 0944 11/24/23 1718  WBC 4.1 4.4  NEUTROABS 2.3 2.3  HGB 7.5 Repeated and verified X2.* 7.5*  HCT 22.9 Repeated and verified X2.*  23.8*  MCV 83.5 86.2  PLT 220.0 247   Basic Metabolic Panel: Recent Labs  Lab 11/24/23 0944 11/24/23 1718  NA 136 139  K 4.6 4.4  CL 101 99  CO2 24 22  GLUCOSE 139* 112*  BUN 46* 49*  CREATININE 2.20* 2.28*  CALCIUM  10.6* 11.6*   GFR: Estimated Creatinine Clearance: 32.7 mL/min (A) (by C-G formula based on SCr of 2.28 mg/dL (H)). Liver Function Tests: Recent Labs  Lab 11/24/23 0944 11/24/23 1718  AST 20 29  ALT 11 15  ALKPHOS 30* 39  BILITOT 0.4 0.3  PROT 7.7 8.1  ALBUMIN 4.3 4.3   No results for input(s): LIPASE, AMYLASE in the last 168 hours. No results for input(s): AMMONIA in the last 168 hours. Coagulation Profile: Recent Labs  Lab 11/24/23 1718  INR 1.3*   Cardiac Enzymes: No results for input(s): CKTOTAL, CKMB, CKMBINDEX, TROPONINI in the last 168 hours. BNP (last 3 results) No results for input(s): PROBNP in the last 8760 hours. HbA1C: Recent Labs    11/24/23 0944  HGBA1C 6.0   CBG: Recent Labs  Lab 11/24/23 2218  GLUCAP 140*   Lipid Profile: Recent Labs    11/24/23 0944  CHOL 118  HDL 30.10*  LDLCALC 50  TRIG 190.0*  CHOLHDL 4   Thyroid  Function Tests: No results for input(s): TSH, T4TOTAL, FREET4, T3FREE, THYROIDAB in the last 72 hours. Anemia Panel: Recent Labs    11/24/23 1718  VITAMINB12 823  FOLATE 38.0  FERRITIN 25  TIBC 529*  IRON  33*  RETICCTPCT 2.3   Urine analysis:    Component Value Date/Time   COLORURINE YELLOW 11/12/2016 0822   APPEARANCEUR CLEAR 11/12/2016 0822   LABSPEC 1.015 11/12/2016 0822   PHURINE 6.0 11/12/2016 0822   GLUCOSEU NEGATIVE 11/12/2016 0822   HGBUR TRACE-LYSED (A) 11/12/2016 0822   BILIRUBINUR NEGATIVE 11/12/2016 0822   KETONESUR NEGATIVE 11/12/2016 0822   PROTEINUR NEGATIVE 02/14/2009 1019   UROBILINOGEN 0.2  11/12/2016 0822   NITRITE NEGATIVE 11/12/2016 0822   LEUKOCYTESUR NEGATIVE 11/12/2016 9177    Radiological Exams on Admission: DG Chest Port 1  View Result Date: 11/24/2023 CLINICAL DATA:  [id number] Anemia [id number] EXAM: PORTABLE CHEST 1 VIEW COMPARISON:  Chest x-ray 12/01/2020 FINDINGS: The heart and mediastinal contours are unchanged. Atherosclerotic plaque. Left base opacity possibly due to patient rotation. Question pulmonary edema. No pleural effusion. No pneumothorax. No acute osseous abnormality. IMPRESSION: 1. Findings suggestive of pulmonary edema. 2. Left base opacity possibly due to patient rotation. 3. Recommend repeat chest x-ray PA and lateral view for further evaluation. 4.  Aortic Atherosclerosis (ICD10-I70.0). Electronically Signed   By: Morgane  Naveau M.D.   On: 11/24/2023 18:32    EKG: Independently reviewed.   Assessment/Plan  Anemia IDA - hgb 7.5 down form 11.9 on routine labs - patient is asymptomatic  - FOB pending  - no overt signs of bleeding  - monitor h/h if <7 will transfuse 1 units  - gi consult  [facility] in am for further assistance  -anemia labs  noted IDA , iron  infusion ordered  Afib on Eliquis   - holding Eliquis  due to anemia  -no rate control agent on med rec  AAA - followed by vascular  -u/s  to be complete ,no recent documented in system  Anxiety /Depression -resume home regimen    MR -f/u with echo in am   CVA  -holding secondary ppx for now due to acute anemia    DMII - iss/fs  -hold metformin  /fargixa due to AKI   GERD -ppi   Hypertension    PVD  -no active issues     DVT prophylaxis: scd Code Status: full/ as discussed per patient wishes in event of cardiac arrest  Family Communication: none at bedside Disposition Plan: patient  expected to be admitted greater than 2 midnights  Consults called:  hematology /GI Admission status: progressive   Camila DELENA Ned MD [facility]   If 7PM-7AM, please contact night-coverage [email or web address] Password [email or web address]  11/25/2023, 2:19 AM

## 2023-11-25 NOTE — Plan of Care (Signed)

## 2023-11-25 NOTE — Progress Notes (Addendum)
 [facility] Progress Note  Patient: David [name]     FMW:[id number]  DOA: 11/24/2023   PCP: Norleen Lynwood ORN, MD       Brief hospital course: This is a 73 year old male with diabetes mellitus type 2, history of CVA, atrial fibrillation, abdominal aortic aneurysm, mild to moderate mitral regurgitation, hypertension, peripheral vascular disease, colon polyps, depression and hemorrhoids who presents to the hospital for hemoglobin that was noted to be 7.5 on routine checkup.   When patient was turned around  check the FOBT patient became short of breath and O2 sat dropped to 80% on room air.   Subjective:  No complaints.   Assessment and Plan: Principal Problem:   Iron  deficiency anemia - given IV Iron  today and has been started on oral Iron  - hold of on blood transfusion as he is asymptomatic - colonoscopy performed in 11/23 - was found to have tubular adenomas-  he was recommended to f/u in 3 yrs for repeat colonoscopy - appreciate GI consult - plan for EGD tomorrow and if negative, he  can be followed up as outpatient by GI  Active Problems:  Hypoxia - has not recurred    Essential hypertension -cont Amlodipine   AKI with CKD stage 3a - hold Benicar , Lasix , Farxiga  - cont IVF and follow    Persistent atrial fibrillation, h/o CVA, PVD, AAA s/p repair - holding Eliquis  and Aspirin for now  DM2 - cont Novolog  sliding scale TIC AC  ECHO reveals EF of 60%, severely enlarged RV and indeterminate LV diastolic parameter    Code Status: Full Code Total time on patient care: 35 min DVT prophylaxis:  SCDs Start: 11/25/23 0304    Objective:   Vitals:   11/25/23 0526 11/25/23 0957 11/25/23 1040 11/25/23 1216  BP: (!) 165/71   (!) 169/69  Pulse: 90  (!) 47 (!) 52  Resp: 20  20 14   Temp: 98.2 F (36.8 C)  97.7 F (36.5 C) 98.2 F (36.8 C)  TempSrc: Oral  Oral Oral  SpO2: 97%  100% 97%  Weight:  111 kg    Height:  5' 10 (1.778 m)     Filed Weights    11/25/23 0957  Weight: 111 kg   Exam: General exam: Appears comfortable  HEENT: oral mucosa moist Respiratory system: Clear to auscultation.  Cardiovascular system: S1 & S2 heard  Gastrointestinal system: Abdomen soft, non-tender, nondistended. Normal bowel sounds   Extremities: No cyanosis, clubbing or edema Psychiatry:  Mood & affect appropriate.      CBC: Recent Labs  Lab 11/24/23 0944 11/24/23 1718 11/25/23 0609 11/25/23 0955 11/25/23 1529  WBC 4.1 4.4 5.0  --   --   NEUTROABS 2.3 2.3  --   --   --   HGB 7.5 Repeated and verified X2.* 7.5* 7.6* 8.0* 8.1*  HCT 22.9 Repeated and verified X2.* 23.8* 25.0* 26.1* 26.2*  MCV 83.5 86.2 88.7  --   --   PLT 220.0 247 222  --   --    Basic Metabolic Panel: Recent Labs  Lab 11/24/23 0944 11/24/23 1718 11/25/23 0609  NA 136 139 137  K 4.6 4.4 3.9  CL 101 99 103  CO2 24 22 23   GLUCOSE 139* 112* 117*  BUN 46* 49* 46*  CREATININE 2.20* 2.28* 2.08*  CALCIUM  10.6* 11.6* 10.0     Scheduled Meds:  amLODipine   10 mg Oral Daily   atorvastatin   40 mg Oral Daily   ferrous gluconate   324  mg Oral BID WC   insulin  aspart  0-5 Units Subcutaneous QHS   [START ON 11/26/2023] pantoprazole   40 mg Oral Daily    Imaging and lab data personally reviewed   Author: Iyari Hagner  11/25/2023 6:43 PM  To contact [facility]>   Check the care team in [facility] and look for the attending/consulting [facility] provider listed  Log into [email or web address] and use [facility]'s universal password   Go to> [facility]  and find provider  If you still have difficulty reaching the provider, please page the [facility] (Director on Call) for the Hospitalists listed on amion

## 2023-11-25 NOTE — Plan of Care (Signed)

## 2023-11-25 NOTE — Progress Notes (Signed)
   11/25/23 0843  TOC Brief Assessment  Insurance and Status Reviewed  Patient has primary care physician Yes  Home environment has been reviewed Resides in single family home with spouse  Prior level of function: Independent with ADLs at baseline  Prior/Current Home Services No current home services  Social Drivers of Health Review SDOH reviewed no interventions necessary  Readmission risk has been reviewed Yes  Transition of care needs no transition of care needs at this time

## 2023-11-26 ENCOUNTER — Encounter (HOSPITAL_COMMUNITY): Payer: Self-pay | Admitting: Anesthesiology

## 2023-11-26 ENCOUNTER — Observation Stay (HOSPITAL_BASED_OUTPATIENT_CLINIC_OR_DEPARTMENT_OTHER): Payer: Self-pay | Admitting: Anesthesiology

## 2023-11-26 ENCOUNTER — Encounter (HOSPITAL_COMMUNITY)
Admission: EM | Disposition: A | Discharge status: Home / Self Care | Payer: Self-pay | Source: Ambulatory Visit | Attending: Emergency Medicine

## 2023-11-26 ENCOUNTER — Encounter (HOSPITAL_COMMUNITY): Payer: Self-pay | Admitting: Internal Medicine

## 2023-11-26 DIAGNOSIS — E1122 Type 2 diabetes mellitus with diabetic chronic kidney disease: Secondary | ICD-10-CM | POA: Diagnosis not present

## 2023-11-26 DIAGNOSIS — N189 Chronic kidney disease, unspecified: Secondary | ICD-10-CM | POA: Diagnosis not present

## 2023-11-26 DIAGNOSIS — K2950 Unspecified chronic gastritis without bleeding: Secondary | ICD-10-CM | POA: Diagnosis not present

## 2023-11-26 DIAGNOSIS — D509 Iron deficiency anemia, unspecified: Secondary | ICD-10-CM

## 2023-11-26 DIAGNOSIS — I4819 Other persistent atrial fibrillation: Secondary | ICD-10-CM

## 2023-11-26 DIAGNOSIS — I129 Hypertensive chronic kidney disease with stage 1 through stage 4 chronic kidney disease, or unspecified chronic kidney disease: Secondary | ICD-10-CM

## 2023-11-26 DIAGNOSIS — D649 Anemia, unspecified: Secondary | ICD-10-CM | POA: Diagnosis present

## 2023-11-26 DIAGNOSIS — K3189 Other diseases of stomach and duodenum: Secondary | ICD-10-CM | POA: Diagnosis not present

## 2023-11-26 DIAGNOSIS — K2980 Duodenitis without bleeding: Secondary | ICD-10-CM

## 2023-11-26 HISTORY — PX: ESOPHAGOGASTRODUODENOSCOPY: SHX5428

## 2023-11-26 LAB — CBC
HCT: 25.3 % — ABNORMAL LOW (ref 39.0–52.0)
Hemoglobin: 7.8 g/dL — ABNORMAL LOW (ref 13.0–17.0)
MCH: 27.5 pg (ref 26.0–34.0)
MCHC: 30.8 g/dL (ref 30.0–36.0)
MCV: 89.1 fL (ref 80.0–100.0)
Platelets: 229 K/uL (ref 150–400)
RBC: 2.84 MIL/uL — ABNORMAL LOW (ref 4.22–5.81)
RDW: 15.6 % — ABNORMAL HIGH (ref 11.5–15.5)
WBC: 5.8 K/uL (ref 4.0–10.5)
nRBC: 0 % (ref 0.0–0.2)

## 2023-11-26 LAB — GLUCOSE, CAPILLARY
Glucose-Capillary: 117 mg/dL — ABNORMAL HIGH (ref 70–99)
Glucose-Capillary: 109 mg/dL — ABNORMAL HIGH (ref 70–99)

## 2023-11-26 LAB — BASIC METABOLIC PANEL WITH GFR
Anion gap: 11 (ref 5–15)
BUN: 45 mg/dL — ABNORMAL HIGH (ref 8–23)
CO2: 22 mmol/L (ref 22–32)
Calcium: 9.8 mg/dL (ref 8.9–10.3)
Chloride: 104 mmol/L (ref 98–111)
Creatinine, Ser: 2.29 mg/dL — ABNORMAL HIGH (ref 0.61–1.24)
GFR, Estimated: 29 mL/min — ABNORMAL LOW (ref >60)
Glucose, Bld: 119 mg/dL — ABNORMAL HIGH (ref 70–99)
Potassium: 4 mmol/L (ref 3.5–5.1)
Sodium: 137 mmol/L (ref 135–145)

## 2023-11-26 SURGERY — EGD (ESOPHAGOGASTRODUODENOSCOPY)
Anesthesia: Monitor Anesthesia Care

## 2023-11-26 MED ORDER — SODIUM CHLORIDE 0.9 % IV SOLN: INTRAVENOUS | Status: DC

## 2023-11-26 MED ORDER — FERROUS GLUCONATE 324 (38 FE) MG PO TABS: 324.0000 mg | ORAL_TABLET | Freq: Two times a day (BID) | ORAL | 0 refills | Status: DC

## 2023-11-26 MED ORDER — DEXMEDETOMIDINE HCL IN NACL 200 MCG/50ML IV SOLN
INTRAVENOUS | Status: DC | PRN
  Administered 2023-11-26: 12 ug via INTRAVENOUS

## 2023-11-26 MED ORDER — SODIUM CHLORIDE 0.9 % IV SOLN: INTRAVENOUS | Status: DC | PRN

## 2023-11-26 MED ORDER — PROPOFOL 500 MG/50ML IV EMUL
INTRAVENOUS | Status: DC | PRN
  Administered 2023-11-26: 150 ug/kg/min via INTRAVENOUS
  Administered 2023-11-26: 30 mg via INTRAVENOUS

## 2023-11-26 NOTE — Discharge Summary (Signed)
 Physician Discharge Summary  David [name] FMW:[id number] DOB: 14-Dec-1950 DOA: 11/24/2023  PCP: Norleen Lynwood ORN, MD  Admit date: 11/24/2023 Discharge date: 11/26/2023  Admitted From: Home  Discharge disposition: Home  Recommendations for Outpatient Follow-Up:   Follow up with your primary care provider in one week.  Check CBC, BMP, magnesium in the next visit Follow-up with GI as outpatient in 1 week for video capsule endoscopy.  Office to schedule an appointment.  Discharge Diagnosis:   Principal Problem:   Iron  deficiency anemia Active Problems:   Essential hypertension   Persistent atrial fibrillation (HCC)   S/P AAA (abdominal aortic aneurysm) repair   Symptomatic anemia   Discharge Condition: Improved.  Diet recommendation: Low sodium, heart healthy.  Carbohydrate-modified.    Wound care: None.  Code status: Full.   History of Present Illness:   David [name] is a 73 year old male with diabetes mellitus type 2, history of CVA, atrial fibrillation on Eliquis , abdominal aortic aneurysm, mild to moderate mitral regurgitation, hypertension, peripheral vascular disease, colon polyps, depression and hemorrhoids presented to the hospital with low hemoglobin at 7.5 on routine checkup.  Denied dark stools or GI bleed.  In the ED, hemodynamically stable.  Creatinine was elevated at 2.2 from baseline 1.5.  Patient was then admitted to the hospital for further evaluation and treatment   Hospital Course:   Following conditions were addressed during hospitalization as listed below,  Iron  deficiency anemia Received IV iron  and started on oral iron .    Colonoscopy was done 11/23 with findings of tubular adenoma and was recommended to follow-up with GI in 3 years for repeat colonoscopy.  GI has seen the patient and underwent EGD on 11/26/2023 with normal findings.  GI has recommended outpatient follow-up for video capsule endoscopy. Will continue iron  on discharge.  Communicated  with GI and okay to resume Eliquis  and aspirin on discharge since there is no active findings on the EGD.   Hypoxia Resolved.  Transient in the ED.       Essential hypertension Continue amlodipine    AKI with CKD stage 3a Creatinine on presentation was 2.2 from baseline 1.5.  Will resume home medications on discharge and check BMP as outpatient     Persistent atrial fibrillation, h/o CVA, PVD, AAA s/p repair Resume Eliquis  and aspirin on discharge   Diabetes mellitus type 2. Currently diabetic diet and oral hypoglycemic agents on discharge.   Disposition.  At this time, patient is stable for disposition home with outpatient PCP and GI follow-up  Medical Consultants:   GI  Procedures:    Upper GI endoscopy 11/26/2023 Subjective:   Today, patient was seen and examined at bedside.  Seen before EGD.  Denied any nausea vomiting shortness of breath dizziness lightheadedness, chest pain  Discharge Exam:   Vitals:   11/26/23 1550 11/26/23 1600  BP: (!) 142/50 (!) 150/63  Pulse: 61 71  Resp: 17 19  Temp:    SpO2: 98% 97%   Vitals:   11/26/23 1536 11/26/23 1540 11/26/23 1550 11/26/23 1600  BP: 119/77 (!) 130/45 (!) 142/50 (!) 150/63  Pulse: (!) 38 (!) 59 61 71  Resp: (!) 22 15 17 19   Temp: 97.7 F (36.5 C)     TempSrc: Temporal     SpO2: 97% 100% 98% 97%  Weight:      Height:       Body mass index is 35.11 kg/m.  General: Alert awake, not in obvious distress obese soft HENT: pupils equally reacting  to light, pallor noted oral mucosa is moist.  Chest:  Clear breath sounds.  No crackles or wheezes.  CVS: S1 &S2 heard. No murmur.  Regular rate and rhythm. Abdomen: Soft, nontender, nondistended.  Bowel sounds are heard.   Extremities: No cyanosis, clubbing or edema.  Peripheral pulses are palpable. Psych: Alert, awake and oriented, normal mood CNS:  No cranial nerve deficits.  Power equal in all extremities.   Skin: Warm and dry.  No rashes noted.  The results of  significant diagnostics from this hospitalization (including imaging, microbiology, ancillary and laboratory) are listed below for reference.     Diagnostic Studies:   US  AORTA Result Date: 11/25/2023 CLINICAL DATA:  Abdominal aortic aneurysm. EXAM: ULTRASOUND OF ABDOMINAL AORTA TECHNIQUE: Ultrasound examination of the abdominal aorta and proximal common iliac arteries was performed to evaluate for aneurysm. Additional color and Doppler images of the distal aorta were obtained to document patency. COMPARISON:  None Available. FINDINGS: Abdominal aortic measurements as follows: Proximal:  3.3 x 3.6  cm Mid:  3.4 x 3.5 cm Distal:  Not visualized cm Patent: N/A Right common iliac artery: Not visualized Left common iliac artery: Not visualized IMPRESSION: Abdominal aorta measures up to 3.6 cm diameter proximally consistent with aneurysm. The distal abdominal aorta and common iliac arteries could not be visualized due to overlying bowel gas. CT imaging recommended for definitive characterization. Electronically Signed   By: Camellia Candle M.D.   On: 11/25/2023 11:46   ECHOCARDIOGRAM COMPLETE Result Date: 11/25/2023    ECHOCARDIOGRAM REPORT   Patient Name:   David [name] Date of Exam: 11/25/2023 Medical Rec #:  [id number]       Height:       66.0 in Accession #:    [id number]      Weight:       243.8 lb Date of Birth:  1951/02/19       BSA:          2.175 m Patient Age:    73 years        BP:           165/71 mmHg Patient Gender: M               HR:           83 bpm. Exam Location:  Inpatient Procedure: 2D Echo, Cardiac Doppler, Color Doppler and Intracardiac            Opacification Agent (Both Spectral and Color Flow Doppler were            utilized during procedure). Indications:    Mitral Valve Disorder  History:        Patient has prior history of Echocardiogram examinations, most                 recent 07/24/2022. Risk Factors:Hypertension, Diabetes and                 Dyslipidemia.  Sonographer:    Philomena Daring Referring Phys: [id number] SARA-MAIZ A THOMAS IMPRESSIONS  1. Left ventricular ejection fraction, by estimation, is 60%. The left ventricle has normal function. The left ventricle has no regional wall motion abnormalities. Left ventricular diastolic parameters are indeterminate.  2. Right ventricular systolic function is normal. The right ventricular size is severely enlarged. There is normal pulmonary artery systolic pressure.  3. Left atrial size was moderately dilated.  4. Right atrial size was severely dilated.  5. The mitral valve is normal in structure. Trivial  mitral valve regurgitation. No evidence of mitral stenosis.  6. The aortic valve is tricuspid. There is mild calcification of the aortic valve. Aortic valve regurgitation is not visualized. Mild to moderate aortic valve stenosis. Aortic valve mean gradient measures 23.0 mmHg. Aortic valve Vmax measures 3.26 m/s.  7. Aortic dilatation noted. There is mild dilatation of the ascending aorta, measuring 40 mm.  8. The inferior vena cava is dilated in size with <50% respiratory variability, suggesting right atrial pressure of 15 mmHg. Comparison(s): Prior images reviewed side by side. Mitral regurgitation has improved. Aortic stenosis gradients higher in this study. This study is technically more challening than prior. FINDINGS  Left Ventricle: Left ventricular ejection fraction, by estimation, is 60%. The left ventricle has normal function. The left ventricle has no regional wall motion abnormalities. Definity  contrast agent was given IV to delineate the left ventricular endocardial borders. The left ventricular internal cavity size was normal in size. Suboptimal image quality limits for assessment of left ventricular hypertrophy. Left ventricular diastolic parameters are indeterminate. Right Ventricle: The right ventricular size is severely enlarged. No increase in right ventricular wall thickness. Right ventricular systolic function is normal. There is  normal pulmonary artery systolic pressure. The tricuspid regurgitant velocity is 2.13 m/s, and with an assumed right atrial pressure of 15 mmHg, the estimated right ventricular systolic pressure is 33.1 mmHg. Left Atrium: Left atrial size was moderately dilated. Right Atrium: Right atrial size was severely dilated. Pericardium: There is no evidence of pericardial effusion. Mitral Valve: The mitral valve is normal in structure. Trivial mitral valve regurgitation. No evidence of mitral valve stenosis. Tricuspid Valve: The tricuspid valve is normal in structure. Tricuspid valve regurgitation is not demonstrated. No evidence of tricuspid stenosis. Aortic Valve: The aortic valve is tricuspid. There is mild calcification of the aortic valve. Aortic valve regurgitation is not visualized. Mild to moderate aortic stenosis is present. Aortic valve mean gradient measures 23.0 mmHg. Aortic valve peak gradient measures 42.5 mmHg. Aortic valve area, by VTI measures 1.32 cm. Pulmonic Valve: The pulmonic valve was normal in structure. Pulmonic valve regurgitation is not visualized. No evidence of pulmonic stenosis. Aorta: Aortic dilatation noted. There is mild dilatation of the ascending aorta, measuring 40 mm. Venous: The inferior vena cava is dilated in size with less than 50% respiratory variability, suggesting right atrial pressure of 15 mmHg. IAS/Shunts: The atrial septum is grossly normal.  LEFT VENTRICLE PLAX 2D LVOT diam:     2.20 cm   Diastology LV SV:         97        LV e' medial:    7.51 cm/s LV SV Index:   45        LV E/e' medial:  19.8 LVOT Area:     3.80 cm  LV e' lateral:   10.40 cm/s                          LV E/e' lateral: 14.3  RIGHT VENTRICLE            IVC RV S prime:     8.38 cm/s  IVC diam: 2.50 cm TAPSE (M-mode): 1.6 cm LEFT ATRIUM              Index        RIGHT ATRIUM           Index LA Vol (A2C):   103.0 ml 47.35 ml/m  RA Area:  30.80 cm LA Vol (A4C):   79.4 ml  36.50 ml/m  RA Volume:   119.00  ml 54.70 ml/m LA Biplane Vol: 92.2 ml  42.38 ml/m  AORTIC VALVE AV Area (Vmax):    1.29 cm AV Area (Vmean):   1.25 cm AV Area (VTI):     1.32 cm AV Vmax:           326.00 cm/s AV Vmean:          210.500 cm/s AV VTI:            0.736 m AV Peak Grad:      42.5 mmHg AV Mean Grad:      23.0 mmHg LVOT Vmax:         111.00 cm/s LVOT Vmean:        69.300 cm/s LVOT VTI:          0.256 m LVOT/AV VTI ratio: 0.35  AORTA Ao Root diam: 2.90 cm Ao Asc diam:  4.00 cm MITRAL VALVE                TRICUSPID VALVE MV Area (PHT): 6.47 cm     TR Peak grad:   18.1 mmHg MV E velocity: 149.00 cm/s  TR Vmax:        213.00 cm/s                              SHUNTS                             Systemic VTI:  0.26 m                             Systemic Diam: 2.20 cm Stanly Leavens MD Electronically signed by Stanly Leavens MD Signature Date/Time: 11/25/2023/9:19:27 AM    Final    DG Chest Port 1 View Result Date: 11/24/2023 CLINICAL DATA:  [id number] Anemia [id number] EXAM: PORTABLE CHEST 1 VIEW COMPARISON:  Chest x-ray 12/01/2020 FINDINGS: The heart and mediastinal contours are unchanged. Atherosclerotic plaque. Left base opacity possibly due to patient rotation. Question pulmonary edema. No pleural effusion. No pneumothorax. No acute osseous abnormality. IMPRESSION: 1. Findings suggestive of pulmonary edema. 2. Left base opacity possibly due to patient rotation. 3. Recommend repeat chest x-ray PA and lateral view for further evaluation. 4.  Aortic Atherosclerosis (ICD10-I70.0). Electronically Signed   By: Morgane  Naveau M.D.   On: 11/24/2023 18:32     Labs:   Basic Metabolic Panel: Recent Labs  Lab 11/24/23 0944 11/24/23 1718 11/25/23 0609 11/26/23 0524  NA 136 139 137 137  K 4.6 4.4 3.9 4.0  CL 101 99 103 104  CO2 24 22 23 22   GLUCOSE 139* 112* 117* 119*  BUN 46* 49* 46* 45*  CREATININE 2.20* 2.28* 2.08* 2.29*  CALCIUM  10.6* 11.6* 10.0 9.8   GFR Estimated Creatinine Clearance: 34.8 mL/min (A) (by C-G  formula based on SCr of 2.29 mg/dL (H)). Liver Function Tests: Recent Labs  Lab 11/24/23 0944 11/24/23 1718 11/25/23 0609  AST 20 29 21   ALT 11 15 13   ALKPHOS 30* 39 28*  BILITOT 0.4 0.3 0.6  PROT 7.7 8.1 7.5  ALBUMIN 4.3 4.3 3.7   No results for input(s): LIPASE, AMYLASE in the last 168 hours. No results for input(s): AMMONIA in the last 168 hours. Coagulation profile Recent Labs  Lab 11/24/23 1718  INR 1.3*    CBC: Recent Labs  Lab 11/24/23 0944 11/24/23 1718 11/25/23 0609 11/25/23 0955 11/25/23 1529 11/25/23 2122 11/26/23 0524  WBC 4.1 4.4 5.0  --   --   --  5.8  NEUTROABS 2.3 2.3  --   --   --   --   --   HGB 7.5 Repeated and verified X2.* 7.5* 7.6* 8.0* 8.1* 7.6* 7.8*  HCT 22.9 Repeated and verified X2.* 23.8* 25.0* 26.1* 26.2* 25.4* 25.3*  MCV 83.5 86.2 88.7  --   --   --  89.1  PLT 220.0 247 222  --   --   --  229   Cardiac Enzymes: No results for input(s): CKTOTAL, CKMB, CKMBINDEX, TROPONINI in the last 168 hours. BNP: Invalid input(s): POCBNP CBG: Recent Labs  Lab 11/25/23 1301 11/25/23 1714 11/25/23 2124 11/26/23 0820 11/26/23 1312  GLUCAP 137* 115* 107* 117* 109*   D-Dimer Recent Labs    11/25/23 0609  DDIMER 1.56*   Hgb A1c Recent Labs    11/24/23 0944  HGBA1C 6.0   Lipid Profile Recent Labs    11/24/23 0944  CHOL 118  HDL 30.10*  LDLCALC 50  TRIG 190.0*  CHOLHDL 4   Thyroid  function studies No results for input(s): TSH, T4TOTAL, T3FREE, THYROIDAB in the last 72 hours.  Invalid input(s): FREET3 Anemia work up Recent Labs    11/24/23 1718 11/25/23 0609  VITAMINB12 823 717  FOLATE 38.0 35.9  FERRITIN 25 13*  TIBC 529* 463*  IRON  33* 26*  RETICCTPCT 2.3 2.2   Microbiology No results found for this or any previous visit (from the past 240 hours).   Discharge Instructions:   Discharge Instructions     Diet - low sodium heart healthy   Complete by: As directed    Diet Carb Modified    Complete by: As directed    Discharge instructions   Complete by: As directed    Follow-up with your primary care provider in 1 week.  Check blood work at that time. Follow-up with GI as outpatient for video capsule endoscopy.  Continue iron  pills at home.  Do not take your blood thinners and aspirin if you experience rectal bleeding and seek urgent medical attention.   Increase activity slowly   Complete by: As directed       Allergies as of 11/26/2023       Reactions   Latex Rash        Medication List     TAKE these medications    amLODipine  10 MG tablet Commonly known as: NORVASC  Take 1 tablet (10 mg total) by mouth daily.   aspirin EC 81 MG tablet Take 81 mg by mouth daily. Swallow whole.   atorvastatin  40 MG tablet Commonly known as: LIPITOR TAKE ONE TABLET BY MOUTH ONCE DAILY What changed: when to take this   cetirizine  10 MG tablet Commonly known as: ZYRTEC  Take 1 tablet (10 mg total) by mouth daily.   docusate sodium  100 MG capsule Commonly known as: COLACE Take 100 mg by mouth 2 (two) times daily.   Eliquis  5 MG Tabs tablet Generic drug: apixaban  TAKE 1 TABLET TWICE A DAY   famotidine 20 MG tablet Commonly known as: PEPCID Take 20 mg by mouth 2 (two) times daily.   Farxiga  10 MG Tabs tablet Generic drug: dapagliflozin propanediol  Take 10 mg by mouth daily.   ferrous gluconate  324 MG tablet Commonly known as: FERGON Take 1  tablet (324 mg total) by mouth 2 (two) times daily with a meal.   Fiber Laxative 0.52 g capsule Generic drug: psyllium Take 3.36 g by mouth daily with lunch.   Fish Oil 1000 MG Caps Take 1,000 mg by mouth daily.   folic acid  800 MCG tablet Commonly known as: FOLVITE  Take 800 mcg by mouth daily.   furosemide  40 MG tablet Commonly known as: LASIX  Take 40 mg by mouth daily.   gemfibrozil  600 MG tablet Commonly known as: LOPID  TAKE TWO TABLETS BY MOUTH ONCE DAILY What changed:  how much to take when to take this    metFORMIN  500 MG 24 hr tablet Commonly known as: GLUCOPHAGE -XR TAKE 4 TABLETS BY MOUTH EVERY MORNING What changed: See the new instructions.   olmesartan  40 MG tablet Commonly known as: BENICAR  Take 1 tablet (40 mg total) by mouth daily. Follow-up appt due in August must see provider for future refills   triamcinolone  55 MCG/ACT Aero nasal inhaler Commonly known as: NASACORT  Place 2 sprays into the nose daily. What changed:  how much to take when to take this reasons to take this   vitamin C 1000 MG tablet Take 1,000 mg by mouth daily.   Vitamin D  50 MCG (2000 UT) tablet Take 2,000 Units by mouth in the morning and at bedtime.        Follow-up Information     Norleen Lynwood ORN, MD Follow up in 1 week(s).   Specialties: Internal Medicine, Radiology Contact information: [address] [place] [address] [phone]         Stacia Glendia BRAVO, MD Follow up in 1 week(s).   Specialty: Gastroenterology Why: to discuss about  capsule endoscopy Contact information: [address] [place] [address] [phone]                  Time coordinating discharge: 39 minutes  Signed:  Jamariyah Johannsen  [facility] 11/26/2023, 4:25 PM

## 2023-11-26 NOTE — Op Note (Signed)
 [facility] Patient Name: David [name] Procedure Date: 11/26/2023 MRN: [id number] Attending MD: Glendia BRAVO. Stacia , MD, [id number] Date of Birth: June 28, 1950 CSN: [id number] Age: 73 Admit Type: Inpatient Procedure:                Upper GI endoscopy Indications:              Iron  deficiency anemia Providers:                Glendia E. Stacia, MD, Randall Lines, RN, Curtistine Bishop, Technician Referring MD:              Medicines:                Monitored Anesthesia Care Complications:            No immediate complications. Estimated Blood Loss:     Estimated blood loss was minimal. Procedure:                Pre-Anesthesia Assessment:                           - Prior to the procedure, a History and Physical                            was performed, and patient medications and                            allergies were reviewed. The patient's tolerance of                            previous anesthesia was also reviewed. The risks                            and benefits of the procedure and the sedation                            options and risks were discussed with the patient.                            All questions were answered, and informed consent                            was obtained. Prior Anticoagulants: The patient has                            taken Eliquis  (apixaban ), last dose was 2 days                            prior to procedure. ASA Grade Assessment: III - A                            patient with severe systemic disease. After  reviewing the risks and benefits, the patient was                            deemed in satisfactory condition to undergo the                            procedure.                           After obtaining informed consent, the endoscope was                            passed under direct vision. Throughout the                            procedure, the patient's blood  pressure, pulse, and                            oxygen saturations were monitored continuously. The                            [id number]-[id number] ([id number]) Olympus endoscope was introduced                            through the mouth, and advanced to the second part                            of duodenum. The upper GI endoscopy was                            accomplished without difficulty. The patient                            tolerated the procedure well. Scope In: Scope Out: Findings:      The examined portions of the nasopharynx, oropharynx and larynx were       normal.      The examined esophagus was normal.      The entire examined stomach was normal. Biopsies were taken with a cold       forceps for Helicobacter pylori testing. Estimated blood loss was       minimal.      The examined duodenum was normal. Biopsies for histology were taken with       a cold forceps for evaluation of celiac disease. Estimated blood loss       was minimal. Impression:               - The examined portions of the nasopharynx,                            oropharynx and larynx were normal.                           - Normal esophagus.                           - Normal stomach. Biopsied.                           -  Normal examined duodenum. Biopsied. Moderate Sedation:      Not Applicable - Patient had care per Anesthesia. Recommendation:           - Return patient to hospital ward for possible                            discharge same day.                           - Resume previous diet.                           - Continue present medications.                           - Resume Eliquis  (apixaban ) at prior dose tomorrow.                           - Await pathology results.                           - Will recommend outpatient capsule endoscopy if                            biopsies negative for H. pylori. Procedure Code(s):        --- Professional ---                           [phone],  Esophagogastroduodenoscopy, flexible,                            transoral; with biopsy, single or multiple Diagnosis Code(s):        --- Professional ---                           D50.9, Iron  deficiency anemia, unspecified CPT copyright 2022 [employer]. All rights reserved. The codes documented in this report are preliminary and upon coder review may  be revised to meet current compliance requirements. Justun Anaya E. Stacia, MD 11/26/2023 3:35:47 PM This report has been signed electronically. Number of Addenda: 0

## 2023-11-26 NOTE — Plan of Care (Signed)
 Problem: Education: Goal: Knowledge of General Education information will improve Description: Including pain rating scale, medication(s)/side effects and non-pharmacologic comfort measures 11/26/2023 1628 by Rosanne Elspeth HERO, RN Outcome: Adequate for Discharge 11/26/2023 1013 by Rosanne Elspeth HERO, RN Outcome: Progressing   Problem: Health Behavior/Discharge Planning: Goal: Ability to manage health-related needs will improve 11/26/2023 1628 by Rosanne Elspeth HERO, RN Outcome: Adequate for Discharge 11/26/2023 1013 by Rosanne Elspeth HERO, RN Outcome: Progressing   Problem: Clinical Measurements: Goal: Ability to maintain clinical measurements within normal limits will improve 11/26/2023 1628 by Rosanne Elspeth HERO, RN Outcome: Adequate for Discharge 11/26/2023 1013 by Rosanne Elspeth HERO, RN Outcome: Progressing Goal: Will remain free from infection 11/26/2023 1628 by Rosanne Elspeth HERO, RN Outcome: Adequate for Discharge 11/26/2023 1013 by Rosanne Elspeth HERO, RN Outcome: Progressing Goal: Diagnostic test results will improve 11/26/2023 1628 by Rosanne Elspeth HERO, RN Outcome: Adequate for Discharge 11/26/2023 1013 by Rosanne Elspeth HERO, RN Outcome: Progressing Goal: Respiratory complications will improve 11/26/2023 1628 by Rosanne Elspeth HERO, RN Outcome: Adequate for Discharge 11/26/2023 1013 by Rosanne Elspeth HERO, RN Outcome: Progressing Goal: Cardiovascular complication will be avoided 11/26/2023 1628 by Rosanne Elspeth HERO, RN Outcome: Adequate for Discharge 11/26/2023 1013 by Rosanne Elspeth HERO, RN Outcome: Progressing   Problem: Activity: Goal: Risk for activity intolerance will decrease 11/26/2023 1628 by Rosanne Elspeth HERO, RN Outcome: Adequate for Discharge 11/26/2023 1013 by Rosanne Elspeth HERO, RN Outcome: Progressing   Problem: Nutrition: Goal: Adequate nutrition will be maintained 11/26/2023 1628 by Rosanne Elspeth HERO, RN Outcome:  Adequate for Discharge 11/26/2023 1013 by Rosanne Elspeth HERO, RN Outcome: Progressing   Problem: Coping: Goal: Level of anxiety will decrease 11/26/2023 1628 by Rosanne Elspeth HERO, RN Outcome: Adequate for Discharge 11/26/2023 1013 by Rosanne Elspeth HERO, RN Outcome: Progressing   Problem: Elimination: Goal: Will not experience complications related to bowel motility 11/26/2023 1628 by Rosanne Elspeth HERO, RN Outcome: Adequate for Discharge 11/26/2023 1013 by Rosanne Elspeth HERO, RN Outcome: Progressing Goal: Will not experience complications related to urinary retention 11/26/2023 1628 by Rosanne Elspeth HERO, RN Outcome: Adequate for Discharge 11/26/2023 1013 by Rosanne Elspeth HERO, RN Outcome: Progressing   Problem: Pain Managment: Goal: General experience of comfort will improve and/or be controlled 11/26/2023 1628 by Rosanne Elspeth HERO, RN Outcome: Adequate for Discharge 11/26/2023 1013 by Rosanne Elspeth HERO, RN Outcome: Progressing   Problem: Safety: Goal: Ability to remain free from injury will improve 11/26/2023 1628 by Rosanne Elspeth HERO, RN Outcome: Adequate for Discharge 11/26/2023 1013 by Rosanne Elspeth HERO, RN Outcome: Progressing   Problem: Skin Integrity: Goal: Risk for impaired skin integrity will decrease 11/26/2023 1628 by Rosanne Elspeth HERO, RN Outcome: Adequate for Discharge 11/26/2023 1013 by Rosanne Elspeth HERO, RN Outcome: Progressing   Problem: Education: Goal: Ability to describe self-care measures that may prevent or decrease complications (Diabetes Survival Skills Education) will improve 11/26/2023 1628 by Rosanne Elspeth HERO, RN Outcome: Adequate for Discharge 11/26/2023 1013 by Rosanne Elspeth HERO, RN Outcome: Progressing Goal: Individualized Educational Video(s) 11/26/2023 1628 by Rosanne Elspeth HERO, RN Outcome: Adequate for Discharge 11/26/2023 1013 by Rosanne Elspeth HERO, RN Outcome: Progressing   Problem: Coping: Goal:  Ability to adjust to condition or change in health will improve 11/26/2023 1628 by Rosanne Elspeth HERO, RN Outcome: Adequate for Discharge 11/26/2023 1013 by Rosanne Elspeth HERO, RN Outcome: Progressing   Problem: Fluid Volume: Goal: Ability to maintain a balanced intake and output will improve 11/26/2023 1628 by Rosanne Elspeth HERO, RN Outcome: Adequate for Discharge 11/26/2023  1013 by Rosanne Elspeth HERO, RN Outcome: Progressing   Problem: Health Behavior/Discharge Planning: Goal: Ability to identify and utilize available resources and services will improve 11/26/2023 1628 by Rosanne Elspeth HERO, RN Outcome: Adequate for Discharge 11/26/2023 1013 by Rosanne Elspeth HERO, RN Outcome: Progressing Goal: Ability to manage health-related needs will improve 11/26/2023 1628 by Rosanne Elspeth HERO, RN Outcome: Adequate for Discharge 11/26/2023 1013 by Rosanne Elspeth HERO, RN Outcome: Progressing   Problem: Metabolic: Goal: Ability to maintain appropriate glucose levels will improve 11/26/2023 1628 by Rosanne Elspeth HERO, RN Outcome: Adequate for Discharge 11/26/2023 1013 by Rosanne Elspeth HERO, RN Outcome: Progressing   Problem: Nutritional: Goal: Maintenance of adequate nutrition will improve 11/26/2023 1628 by Rosanne Elspeth HERO, RN Outcome: Adequate for Discharge 11/26/2023 1013 by Rosanne Elspeth HERO, RN Outcome: Progressing Goal: Progress toward achieving an optimal weight will improve 11/26/2023 1628 by Rosanne Elspeth HERO, RN Outcome: Adequate for Discharge 11/26/2023 1013 by Rosanne Elspeth HERO, RN Outcome: Progressing   Problem: Skin Integrity: Goal: Risk for impaired skin integrity will decrease 11/26/2023 1628 by Rosanne Elspeth HERO, RN Outcome: Adequate for Discharge 11/26/2023 1013 by Rosanne Elspeth HERO, RN Outcome: Progressing   Problem: Tissue Perfusion: Goal: Adequacy of tissue perfusion will improve 11/26/2023 1628 by Rosanne Elspeth HERO, RN Outcome:  Adequate for Discharge 11/26/2023 1013 by Rosanne Elspeth HERO, RN Outcome: Progressing

## 2023-11-26 NOTE — Care Management Obs Status (Signed)
 MEDICARE OBSERVATION STATUS NOTIFICATION   Patient Details  Name: COBAIN MORICI MRN: [id number] Date of Birth: May 18, 1950   Medicare Observation Status Notification Given:  Yes    Duwaine GORMAN Aran, LCSW 11/26/2023, 10:04 AM

## 2023-11-26 NOTE — Care Management CC44 (Signed)
 Condition Code 44 Documentation Completed  Patient Details  Name: ULRICK METHOT MRN: [id number] Date of Birth: Jul 30, 1950   Condition Code 44 given:  Yes Patient signature on Condition Code 44 notice:  Yes Documentation of 2 MD's agreement:  Yes Code 44 added to claim:  Yes    Duwaine GORMAN Aran, LCSW 11/26/2023, 10:04 AM

## 2023-11-26 NOTE — Interval H&P Note (Signed)
 History and Physical Interval Note:  11/26/2023 2:03 PM  David [name]  has presented today for surgery, with the diagnosis of Iron  deficiency anemia.  The various methods of treatment have been discussed with the patient and family. After consideration of risks, benefits and other options for treatment, the patient has consented to  Procedure(s): EGD (ESOPHAGOGASTRODUODENOSCOPY) (N/A) as a surgical intervention.  The patient's history has been reviewed, patient examined, no change in status, stable for surgery.  I have reviewed the patient's chart and labs.  Questions were answered to the patient's satisfaction.     [name]

## 2023-11-26 NOTE — Progress Notes (Addendum)
 Patient reported he has a large, hard brown bowel movement this morning. Given ice chips this morning.

## 2023-11-26 NOTE — Transfer of Care (Signed)
 Immediate Anesthesia Transfer of Care Note  Patient: David [name]  Procedure(s) Performed: EGD (ESOPHAGOGASTRODUODENOSCOPY)  Patient Location: PACU  Anesthesia Type:General  Level of Consciousness: awake, alert , and oriented  Airway & Oxygen Therapy: Patient Spontanous Breathing and Patient connected to face mask oxygen  Post-op Assessment: Report given to RN and Post -op Vital signs reviewed and stable  Post vital signs: Reviewed and stable  Last Vitals:  Vitals Value Taken Time  BP    Temp    Pulse 49 11/26/23 15:42  Resp 15 11/26/23 15:42  SpO2 100 % 11/26/23 15:42  Vitals shown include unfiled device data.  Last Pain:  Vitals:   11/26/23 1355  TempSrc: Temporal  PainSc: 0-No pain         Complications: No notable events documented.

## 2023-11-26 NOTE — Anesthesia Postprocedure Evaluation (Signed)
 Anesthesia Post Note  Patient: David [name]  Procedure(s) Performed: EGD (ESOPHAGOGASTRODUODENOSCOPY)     Patient location during evaluation: Endoscopy Anesthesia Type: MAC Level of consciousness: awake and alert Pain management: pain level controlled Vital Signs Assessment: post-procedure vital signs reviewed and stable Respiratory status: spontaneous breathing, nonlabored ventilation, respiratory function stable and patient connected to nasal cannula oxygen Cardiovascular status: blood pressure returned to baseline and stable Postop Assessment: no apparent nausea or vomiting Anesthetic complications: no   No notable events documented.  Last Vitals:  Vitals:   11/26/23 1540 11/26/23 1550  BP: (!) 130/45 (!) 142/50  Pulse: (!) 59 61  Resp: 15 17  Temp:    SpO2: 100% 98%    Last Pain:  Vitals:   11/26/23 1540  TempSrc:   PainSc: 0-No pain                 Garnette DELENA Gab

## 2023-11-26 NOTE — Plan of Care (Signed)

## 2023-11-26 NOTE — Plan of Care (Signed)

## 2023-11-27 ENCOUNTER — Telehealth: Payer: Self-pay

## 2023-11-27 NOTE — Transitions of Care (Post Inpatient/ED Visit) (Signed)
 11/27/2023  Name: David [name] MRN: [id number] DOB: 1950-06-07  Today's TOC FU Call Status: Today's TOC FU Call Status:: Successful TOC FU Call Completed TOC FU Call Complete Date: 11/27/23 Patient's Name and Date of Birth confirmed.  Transition Care Management Follow-up Telephone Call Date of Discharge: 11/26/23 Discharge Facility: [facility]) Type of Discharge: Inpatient Admission Primary Inpatient Discharge Diagnosis:: anemia How have you been since you were released from the hospital?: Better Any questions or concerns?: No  Items Reviewed: Did you receive and understand the discharge instructions provided?: Yes Medications obtained,verified, and reconciled?: Yes (Medications Reviewed) Any new allergies since your discharge?: No Dietary orders reviewed?: Yes Do you have support at home?: Yes People in Home [RPT]: spouse  Medications Reviewed Today: Medications Reviewed Today     Reviewed by Emmitt Pan, LPN (Licensed Practical Nurse) on 11/27/23 at 1156  Med List Status: <None>   Medication Order Taking? Sig Documenting Provider Last Dose Status Informant  amLODipine  (NORVASC ) 10 MG tablet [id number] Yes Take 1 tablet (10 mg total) by mouth daily. Norleen Lynwood ORN, MD  Active Self, Pharmacy Records, Multiple Informants  apixaban  (ELIQUIS ) 5 MG TABS tablet [id number] Yes TAKE 1 TABLET TWICE A DAY Verlin Lonni BIRCH, MD  Active Self, Pharmacy Records, Multiple Informants  Ascorbic Acid (VITAMIN C) 1000 MG tablet [id number] Yes Take 1,000 mg by mouth daily. [provider]  Active Self, Pharmacy Records, Multiple Informants  aspirin EC 81 MG tablet [id number] Yes Take 81 mg by mouth daily. Swallow whole. [provider]  Active Self, Pharmacy Records, Multiple Informants  atorvastatin  (LIPITOR) 40 MG tablet [id number] Yes TAKE ONE TABLET BY MOUTH ONCE DAILY  Patient taking differently: Take 40 mg by mouth at bedtime.   Norleen Lynwood ORN, MD  Active Self,  Pharmacy Records, Multiple Informants  cetirizine  (ZYRTEC ) 10 MG tablet [id number] Yes Take 1 tablet (10 mg total) by mouth daily. Norleen Lynwood ORN, MD  Active Self, Pharmacy Records, Multiple Informants  Cholecalciferol (VITAMIN D ) 50 MCG (2000 UT) tablet [id number] Yes Take 2,000 Units by mouth in the morning and at bedtime. [provider]  Active Self, Pharmacy Records, Multiple Informants  docusate sodium  (COLACE) 100 MG capsule [id number] Yes Take 100 mg by mouth 2 (two) times daily. [provider]  Active Self, Pharmacy Records, Multiple Informants  famotidine (PEPCID) 20 MG tablet [id number] Yes Take 20 mg by mouth 2 (two) times daily. [provider]  Active Self, Pharmacy Records, Multiple Informants  FARXIGA  10 MG TABS tablet [id number] Yes Take 10 mg by mouth daily. [provider]  Active Self, Pharmacy Records, Multiple Informants  ferrous gluconate  (FERGON) 324 MG tablet [id number] Yes Take 1 tablet (324 mg total) by mouth 2 (two) times daily with a meal. Pokhrel, Laxman, MD  Active   folic acid  (FOLVITE ) 800 MCG tablet [id number] Yes Take 800 mcg by mouth daily. [provider]  Active Self, Pharmacy Records, Multiple Informants  furosemide  (LASIX ) 40 MG tablet [id number] Yes Take 40 mg by mouth daily. [provider]  Active Self, Pharmacy Records, Multiple Informants  gemfibrozil  (LOPID ) 600 MG tablet [id number] Yes TAKE TWO TABLETS BY MOUTH ONCE DAILY  Patient taking differently: Take 600 mg by mouth in the morning and at bedtime.   Norleen Lynwood ORN, MD  Active Self, Pharmacy Records, Multiple Informants  metFORMIN  (GLUCOPHAGE -XR) 500 MG 24 hr tablet [id number] Yes TAKE 4 TABLETS BY MOUTH EVERY MORNING  Patient taking differently: Take 2,000 mg by mouth daily  with breakfast.   Norleen Lynwood ORN, MD  Active Self, Pharmacy Records, Multiple Informants  olmesartan  (BENICAR ) 40 MG tablet [id number] Yes Take 1 tablet (40 mg total) by mouth daily.  Follow-up appt due in August must see provider for future refills Norleen Lynwood ORN, MD  Active Self, Pharmacy Records, Multiple Informants  Omega-3 Fatty Acids (FISH OIL) 1000 MG CAPS [id number] Yes Take 1,000 mg by mouth daily. [provider]  Active Self, Pharmacy Records, Multiple Informants  psyllium (FIBER LAXATIVE) 0.52 g capsule [id number] Yes Take 3.36 g by mouth daily with lunch. [provider]  Active Self, Pharmacy Records, Multiple Informants           Med Note STEFFI, [place]   Tue Nov 25, 2023 10:18 AM)    triamcinolone  (NASACORT ) 55 MCG/ACT AERO nasal inhaler [id number] Yes Place 2 sprays into the nose daily.  Patient taking differently: Place 1 spray into the nose daily as needed (allergies).   Norleen Lynwood ORN, MD  Active Self, Pharmacy Records, Multiple Informants            Home Care and Equipment/Supplies: Were Home Health Services Ordered?: NA Any new equipment or medical supplies ordered?: NA  Functional Questionnaire: Do you need assistance with bathing/showering or dressing?: No Do you need assistance with meal preparation?: No Do you need assistance with eating?: No Do you have difficulty maintaining continence: No Do you need assistance with getting out of bed/getting out of a chair/moving?: No Do you have difficulty managing or taking your medications?: No  Follow up appointments reviewed: PCP Follow-up appointment confirmed?: Yes Date of PCP follow-up appointment?: 12/03/23 Follow-up Provider: [facility] Follow-up appointment confirmed?: No Reason Specialist Follow-Up Not Confirmed: Patient has Specialist Provider Number and will Call for Appointment Do you need transportation to your follow-up appointment?: No Do you understand care options if your condition(s) worsen?: Yes-patient verbalized understanding    SIGNATURE Julian Lemmings, LPN [facility] Nurse Health Advisor Direct Dial [phone]

## 2023-11-28 ENCOUNTER — Telehealth: Payer: Self-pay | Admitting: Gastroenterology

## 2023-11-28 LAB — SURGICAL PATHOLOGY

## 2023-11-28 NOTE — Telephone Encounter (Signed)
 Patient called to schedule an appointment with Dr. Stacia due to a hospital visit he had. Patient was advised that Dr. Stacia has a next available appointment for September. Patient was very frustrated and upset because on his discharge paper it stated that he would need to follow up with Dr. Stacia in one week. Patient stated that he does not understand why he has to wait 2 months for an appointment. Patient is requesting to speak to the nurse. Please advise.

## 2023-11-28 NOTE — Telephone Encounter (Signed)
 Spoke to patient to advise that it appears we may need to have him complete a capsule endoscopy pending pathology results from endoscopy. Advised these have not yet returned but will reach out to him with recommendations and any appointments needed when they do return. Patient verbalizes understanding.

## 2023-11-29 ENCOUNTER — Encounter (HOSPITAL_COMMUNITY): Payer: Self-pay | Admitting: Gastroenterology

## 2023-11-30 ENCOUNTER — Ambulatory Visit: Payer: Self-pay | Admitting: Gastroenterology

## 2023-11-30 DIAGNOSIS — D509 Iron deficiency anemia, unspecified: Secondary | ICD-10-CM

## 2023-11-30 NOTE — Progress Notes (Signed)
 David [name],  The biopsies taken from your stomach were notable for mild chronic gastritis (inflammation) which is a common finding, but there was no evidence of Helicobacter pylori infection. This common finding is not likely to explain iron  deficiency anemia. The biopsies of your duodenum showed benign reactive changes most likely related to stomach acid exposure, but no evidence of celiac disease that would explain iron  deficiency anemia.  As discussed, given the absence of a source of iron  deficiency on these biopsies, I recommend we proceed with the video capsule endoscopy (VCE).  You will be contacted by our office to schedule the procedure and will be given further instructions at that time.   Team,  Please schedule David [name] for outpatient VCE.

## 2023-12-03 ENCOUNTER — Encounter: Payer: Self-pay | Admitting: Internal Medicine

## 2023-12-03 ENCOUNTER — Ambulatory Visit: Payer: Self-pay | Admitting: Internal Medicine

## 2023-12-03 ENCOUNTER — Ambulatory Visit: Admitting: Internal Medicine

## 2023-12-03 ENCOUNTER — Telehealth: Payer: Self-pay

## 2023-12-03 VITALS — BP 122/62 | HR 53 | Temp 98.0°F | Ht 70.0 in | Wt 244.8 lb

## 2023-12-03 DIAGNOSIS — D509 Iron deficiency anemia, unspecified: Secondary | ICD-10-CM | POA: Diagnosis not present

## 2023-12-03 DIAGNOSIS — N183 Chronic kidney disease, stage 3 unspecified: Secondary | ICD-10-CM

## 2023-12-03 DIAGNOSIS — Z7984 Long term (current) use of oral hypoglycemic drugs: Secondary | ICD-10-CM

## 2023-12-03 DIAGNOSIS — I350 Nonrheumatic aortic (valve) stenosis: Secondary | ICD-10-CM | POA: Insufficient documentation

## 2023-12-03 DIAGNOSIS — E559 Vitamin D deficiency, unspecified: Secondary | ICD-10-CM

## 2023-12-03 DIAGNOSIS — I714 Abdominal aortic aneurysm, without rupture, unspecified: Secondary | ICD-10-CM | POA: Diagnosis not present

## 2023-12-03 DIAGNOSIS — E1121 Type 2 diabetes mellitus with diabetic nephropathy: Secondary | ICD-10-CM | POA: Diagnosis not present

## 2023-12-03 LAB — BASIC METABOLIC PANEL WITH GFR
BUN: 40 mg/dL — ABNORMAL HIGH (ref 6–23)
CO2: 22 meq/L (ref 19–32)
Calcium: 10 mg/dL (ref 8.4–10.5)
Chloride: 103 meq/L (ref 96–112)
Creatinine, Ser: 2.38 mg/dL — ABNORMAL HIGH (ref 0.40–1.50)
GFR: 26.07 mL/min — ABNORMAL LOW (ref >60.00)
Glucose, Bld: 133 mg/dL — ABNORMAL HIGH (ref 70–99)
Potassium: 4.6 meq/L (ref 3.5–5.1)
Sodium: 137 meq/L (ref 135–145)

## 2023-12-03 LAB — HEPATIC FUNCTION PANEL
ALT: 13 U/L (ref 0–53)
AST: 22 U/L (ref 0–37)
Albumin: 4.4 g/dL (ref 3.5–5.2)
Alkaline Phosphatase: 31 U/L — ABNORMAL LOW (ref 39–117)
Bilirubin, Direct: 0.1 mg/dL (ref 0.0–0.3)
Total Bilirubin: 0.3 mg/dL (ref 0.2–1.2)
Total Protein: 7.8 g/dL (ref 6.0–8.3)

## 2023-12-03 LAB — CBC WITH DIFFERENTIAL/PLATELET
Basophils Absolute: 0.1 K/uL (ref 0.0–0.1)
Basophils Relative: 1.9 % (ref 0.0–3.0)
Eosinophils Absolute: 0.6 K/uL (ref 0.0–0.7)
Eosinophils Relative: 12.4 % — ABNORMAL HIGH (ref 0.0–5.0)
HCT: 24.9 % — ABNORMAL LOW (ref 39.0–52.0)
Hemoglobin: 8 g/dL — CL (ref 13.0–17.0)
Lymphocytes Relative: 19.3 % (ref 12.0–46.0)
Lymphs Abs: 0.9 K/uL (ref 0.7–4.0)
MCHC: 32.2 g/dL (ref 30.0–36.0)
MCV: 85.7 fl (ref 78.0–100.0)
Monocytes Absolute: 0.6 K/uL (ref 0.1–1.0)
Monocytes Relative: 13.9 % — ABNORMAL HIGH (ref 3.0–12.0)
Neutro Abs: 2.4 K/uL (ref 1.4–7.7)
Neutrophils Relative %: 52.5 % (ref 43.0–77.0)
Platelets: 240 K/uL (ref 150.0–400.0)
RBC: 2.91 Mil/uL — ABNORMAL LOW (ref 4.22–5.81)
RDW: 19.4 % — ABNORMAL HIGH (ref 11.5–15.5)
WBC: 4.5 K/uL (ref 4.0–10.5)

## 2023-12-03 LAB — MAGNESIUM: Magnesium: 1.9 mg/dL (ref 1.5–2.5)

## 2023-12-03 NOTE — Assessment & Plan Note (Signed)
 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

## 2023-12-03 NOTE — Assessment & Plan Note (Signed)
 Last vitamin D  Lab Results  Component Value Date   VD25OH 39.52 11/24/2023   Low, to start oral replacement

## 2023-12-03 NOTE — Telephone Encounter (Signed)
 Ok this is noted, no new orders

## 2023-12-03 NOTE — Progress Notes (Signed)
 Patient ID: David [name], male   DOB: [birth year], 73 y.o.   MRN: [id number]        Chief Complaint: follow up iron  def anemia, AAA, low vit d, ckd3a, dm - s/p hospn 7/21 - 7/23       HPI:  David [name] is a 73 y.o. male here overall doing well after above;  presented to the hospital with low hemoglobin at 7.5 on routine checkup.  Denied dark stools or GI bleed.  In the ED, hemodynamically stable.  Creatinine was elevated at 2.2 from baseline 1.5.  Patient was then admitted to the hospital for further evaluation and treatment  Stool testing neg for blood.  Received IV iron  and started on oral iron .    Colonoscopy was done 11/23 with findings of tubular adenoma and was recommended to follow-up with GI in 3 years for repeat colonoscopy.  GI has seen the patient and underwent EGD on 11/26/2023 with normal findings.  GI has recommended outpatient follow-up for video capsule endoscopy.  Pt has been tolerating bid iron   Renal function improved, now resumed home meds and for f/u bmp today.  No overt bleeding, remains on eliquis  and asa.  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   Anticardiolipin antibody positive    Anxiety    ANXIETY 07/19/2008   CEREBROVASCULAR ACCIDENT, HX OF 07/19/2008   CVA (cerebral vascular accident) (HCC) 2003   Depression    DEPRESSION 07/19/2008   Diabetes mellitus    type II   DIABETES MELLITUS, TYPE II 01/18/2008   Diverticulosis    GERD 07/19/2008   GERD (gastroesophageal reflux disease)    Hemorrhoids    Hx of adenomatous colonic polyps     Hyperlipidemia    HYPERLIPIDEMIA 07/19/2008   Hypertension    HYPERTENSION 07/31/2009   Increased prostate specific antigen (PSA) velocity 09/13/2010   Internal hemorrhoids    Mitral regurgitation    Echo 9/21: EF 55-60, no RWMA, normal RVSF, mild-moderate LAE, mild-moderate MR, mild aortic stenosis (mean gradient 9.2 mmHg)   Perineal abscess    Peripheral vascular disease (HCC)    peripheral vascular disease/carotid artery <100%   PERSONAL HX COLONIC POLYPS 12/10/2007   Unspecified Peripheral Vascular Disease 01/18/2008   Varicose veins    Past Surgical History:  Procedure Laterality Date   7 abdominal hernia repairs  05/31/10   ABDOMINAL AORTAGRAM N/A 01/12/2013   Procedure: ABDOMINAL EZELLA;  Surgeon: Gaile LELON New, MD;  Location: [facility] CATH LAB;  Service: Cardiovascular;  Laterality: N/A;   ABDOMINAL AORTIC ANEURYSM REPAIR     ABDOMINAL AORTOGRAM W/LOWER EXTREMITY Bilateral 10/05/2019   Procedure: ABDOMINAL AORTOGRAM W/LOWER EXTREMITY;  Surgeon: New Gaile LELON, MD;  Location: [facility] INVASIVE CV LAB;  Service: Cardiovascular;  Laterality: Bilateral;   COLONOSCOPY     ESOPHAGOGASTRODUODENOSCOPY N/A 11/26/2023   Procedure: EGD (ESOPHAGOGASTRODUODENOSCOPY);  Surgeon: Stacia Glendia BRAVO, MD;  Location: [facility];  Service: Gastroenterology;  Laterality: N/A;  hernia surgury     73 years old   perineal abscess     PERIPHERAL VASCULAR CATHETERIZATION N/A 11/23/2014   Procedure: Abdominal Aortogram;  Surgeon: Gaile LELON New, MD;  Location: [facility] INVASIVE CV LAB;  Service: Cardiovascular;  Laterality: N/A;   PERIPHERAL VASCULAR CATHETERIZATION  11/23/2014   Procedure: Lower Extremity Angiography;  Surgeon: Gaile LELON New, MD;  Location: [facility] INVASIVE CV LAB;  Service: Cardiovascular;;   PERIPHERAL VASCULAR CATHETERIZATION  11/23/2014   Procedure: Peripheral Vascular Intervention;  Surgeon: Gaile LELON New, MD;  Location: [facility] INVASIVE CV LAB;  Service: Cardiovascular;;  PTA bilateral common  iliac PTA/DCB left fem-pop   POLYPECTOMY     s/p right and left popliteal aneurysm repair     SP ENDO REPAIR INFRAREN AAA      reports that he quit smoking about 31 years ago. His smoking use included cigarettes. He has never used smokeless tobacco. He reports current alcohol use of about 2.0 standard drinks of alcohol per week. He reports that he does not use drugs. family history includes Aortic aneurysm in his mother; Cancer in his brother. Allergies  Allergen Reactions   Latex Rash   Current Outpatient Medications on File Prior to Visit  Medication Sig Dispense Refill   amLODipine  (NORVASC ) 10 MG tablet Take 1 tablet (10 mg total) by mouth daily. 90 tablet 1   apixaban  (ELIQUIS ) 5 MG TABS tablet TAKE 1 TABLET TWICE A DAY 180 tablet 1   Ascorbic Acid (VITAMIN C) 1000 MG tablet Take 1,000 mg by mouth daily.     aspirin EC 81 MG tablet Take 81 mg by mouth daily. Swallow whole.     atorvastatin  (LIPITOR) 40 MG tablet TAKE ONE TABLET BY MOUTH ONCE DAILY (Patient taking differently: Take 40 mg by mouth at bedtime.) 90 tablet 2   cetirizine  (ZYRTEC ) 10 MG tablet Take 1 tablet (10 mg total) by mouth daily. 90 tablet 3   Cholecalciferol (VITAMIN D ) 50 MCG (2000 UT) tablet Take 2,000 Units by mouth in the morning and at bedtime.     docusate sodium  (COLACE) 100 MG capsule Take 100 mg by mouth 2 (two) times daily.     famotidine (PEPCID) 20 MG tablet Take 20 mg by mouth 2 (two) times daily.     FARXIGA  10 MG TABS tablet Take 10 mg by mouth daily.     ferrous gluconate  (FERGON) 324 MG tablet Take 1 tablet (324 mg total) by mouth 2 (two) times daily with a meal. 180 tablet 0   folic acid  (FOLVITE ) 800 MCG tablet Take 800 mcg by mouth daily.     furosemide  (LASIX ) 40 MG tablet Take 40 mg by mouth daily.     gemfibrozil  (LOPID ) 600 MG tablet TAKE TWO TABLETS BY MOUTH ONCE DAILY (Patient taking differently: Take 600 mg by mouth in the morning and at bedtime.) 180 tablet 2   metFORMIN  (GLUCOPHAGE -XR)  500 MG 24 hr tablet TAKE 4 TABLETS BY MOUTH EVERY MORNING (Patient taking differently: Take 2,000 mg by mouth daily with breakfast.) 360 tablet 1   olmesartan  (BENICAR ) 40 MG tablet Take 1 tablet (40 mg total) by mouth daily. Follow-up appt due in August must see provider for future refills 90 tablet 0   Omega-3 Fatty Acids (FISH OIL) 1000 MG CAPS Take 1,000 mg by mouth daily.     psyllium (FIBER LAXATIVE) 0.52 g capsule Take 3.36 g by mouth daily with lunch.     triamcinolone  (NASACORT ) 55 MCG/ACT AERO nasal inhaler  Place 2 sprays into the nose daily. (Patient taking differently: Place 1 spray into the nose daily as needed (allergies).) 1 Inhaler 12   No current facility-administered medications on file prior to visit.        ROS:  All others reviewed and negative.  Objective        PE:  BP 122/62   Pulse (!) 53   Temp 98 F (36.7 C)   Ht 5' 10 (1.778 m)   Wt 244 lb 12.8 oz (111 kg)   SpO2 99%   BMI 35.13 kg/m                 Constitutional: Pt appears in NAD               HENT: Head: NCAT.                Right Ear: External ear normal.                 Left Ear: External ear normal.                Eyes: . Pupils are equal, round, and reactive to light. Conjunctivae and EOM are normal               Nose: without d/c or deformity               Neck: Neck supple. Gross normal ROM               Cardiovascular: Normal rate and regular rhythm.                 Pulmonary/Chest: Effort normal and breath sounds without rales or wheezing.                Abd:  Soft, NT, ND, + BS, no organomegaly               Neurological: Pt is alert. At baseline orientation, motor grossly intact               Skin: Skin is warm. No rashes, no other new lesions, LE edema - trace bilateral               Psychiatric: Pt behavior is normal without agitation   Micro: none  Cardiac tracings I have personally interpreted today:  none  Pertinent Radiological findings (summarize): none   Lab Results   Component Value Date   WBC 4.5 12/03/2023   HGB 8.0 Repeated and verified X2. (LL) 12/03/2023   HCT 24.9 Repeated and verified X2. (L) 12/03/2023   PLT 240.0 12/03/2023   GLUCOSE 133 (H) 12/03/2023   CHOL 118 11/24/2023   TRIG 190.0 (H) 11/24/2023   HDL 30.10 (L) 11/24/2023   LDLDIRECT 64.0 05/24/2022   LDLCALC 50 11/24/2023   ALT 13 12/03/2023   AST 22 12/03/2023   NA 137 12/03/2023   K 4.6 12/03/2023   CL 103 12/03/2023   CREATININE 2.38 (H) 12/03/2023   BUN 40 (H) 12/03/2023   CO2 22 12/03/2023   TSH 1.86 05/27/2023   PSA 1.36 05/27/2023   INR 1.3 (H) 11/24/2023   HGBA1C 6.0 11/24/2023   MICROALBUR 3.9 (H) 03/16/2010   Assessment/Plan:  David [name] is a 73 y.o. 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) (09/07/2008), Anticardiolipin antibody positive, Anxiety, ANXIETY (07/19/2008), CEREBROVASCULAR ACCIDENT,  HX OF (07/19/2008), CVA (cerebral vascular accident) (HCC) (2003), Depression, DEPRESSION (07/19/2008), Diabetes mellitus, DIABETES MELLITUS, TYPE II (01/18/2008), Diverticulosis, GERD (07/19/2008), GERD (gastroesophageal reflux disease), Hemorrhoids, adenomatous colonic polyps, Hyperlipidemia, HYPERLIPIDEMIA (07/19/2008), Hypertension, HYPERTENSION (07/31/2009), Increased prostate specific antigen (PSA) velocity (09/13/2010), Internal hemorrhoids, Mitral regurgitation, Perineal abscess, Peripheral vascular disease (HCC), PERSONAL HX COLONIC POLYPS (12/10/2007), Unspecified Peripheral Vascular Disease (01/18/2008), and Varicose veins.  Aneurysm of abdominal vessel (HCC) Recent US  Aorta 3.6 cm, mild, pt to continue asa, eliquis , statin and f/u in 1 yr  Vitamin D  deficiency Last vitamin D  Lab Results  Component Value Date   VD25OH 39.52 11/24/2023   Low, to start oral replacement   Iron  deficiency anemia Etiology unclear, still no overt  bleeding and asymptomatic - for fu cbc today, and GI with Dr Stacia, continue oral iron  bid  Diabetes [facility]) Lab Results  Component Value Date   HGBA1C 6.0 11/24/2023   Stable, pt to continue current medical treatment farxiga  10 every day, metformin  ER 500 mg - 4 qd   CKD (chronic kidney disease) Lab Results  Component Value Date   CREATININE 2.38 (H) 12/03/2023   Stable overall, cont to avoid nephrotoxins, cont all home meds  Followup: Return in about 3 months (around 03/04/2024).  Lynwood Rush, MD 12/03/2023 12:51 PM [facility] Medicine

## 2023-12-03 NOTE — Assessment & Plan Note (Signed)
 Lab Results  Component Value Date   HGBA1C 6.0 11/24/2023   Stable, pt to continue current medical treatment farxiga  10 every day, metformin  ER 500 mg - 4 qd

## 2023-12-03 NOTE — Telephone Encounter (Signed)
 CRITICAL VALUE STICKER  CRITICAL VALUE: Hemoglobin 8.0 Hematocrit 24.9  RECEIVER (on-site recipient of call): Myself  DATE & TIME NOTIFIED: 12/03/2023 at [employer] (representative from lab): Niels Ellen  MD NOTIFIED: Dr.John, yes  TIME OF NOTIFICATION: 11am  RESPONSE:  Message sent

## 2023-12-03 NOTE — Patient Instructions (Signed)
 Please continue all other medications as before, and refills have been done if requested.  Please have the pharmacy call with any other refills you may need.  Please continue your efforts at being more active, low cholesterol diet, and weight control.  You are otherwise up to date with prevention measures today.  Please keep your appointments with your specialists as you may have planned - Dr Stacia for the pill cam  Please go to the LAB at the blood drawing area for the tests to be done  You will be contacted by phone if any changes need to be made immediately.  Otherwise, you will receive a letter about your results with an explanation, but please check with MyChart first.  Please make an Appointment to return in 3 months, or sooner if needed

## 2023-12-03 NOTE — Assessment & Plan Note (Signed)
 Lab Results  Component Value Date   CREATININE 2.38 (H) 12/03/2023   Stable overall, cont to avoid nephrotoxins, cont all home meds

## 2023-12-03 NOTE — Assessment & Plan Note (Addendum)
 Etiology unclear, still no overt bleeding and asymptomatic - for fu cbc today, and GI with Dr Stacia, continue oral iron  bid

## 2023-12-03 NOTE — Progress Notes (Signed)
 The test results show that your current treatment is OK, as the tests are stable and the hemoglobin is slightly improved as we expected..  Please continue the same plan.  There is no other need for change of treatment or further evaluation based on these results, at this time.  thanks

## 2023-12-08 ENCOUNTER — Encounter: Payer: Self-pay | Admitting: Gastroenterology

## 2023-12-08 ENCOUNTER — Telehealth: Payer: Self-pay | Admitting: Gastroenterology

## 2023-12-08 NOTE — Telephone Encounter (Signed)
 See 11/30/23 surg path note for additional information.

## 2023-12-08 NOTE — Telephone Encounter (Signed)
 Patient returning call. Requesting a call back. Please advise.   Thank you

## 2023-12-19 ENCOUNTER — Other Ambulatory Visit: Payer: Self-pay | Admitting: Internal Medicine

## 2023-12-24 DIAGNOSIS — Z961 Presence of intraocular lens: Secondary | ICD-10-CM | POA: Diagnosis not present

## 2023-12-25 ENCOUNTER — Telehealth: Payer: Self-pay | Admitting: Gastroenterology

## 2023-12-25 NOTE — Telephone Encounter (Signed)
 Attempted to reach patient. No answer, voicemail not activated.

## 2023-12-25 NOTE — Telephone Encounter (Signed)
 Inbound call from patient stating he would like to speak to nurse in regards to some questions on medication before his capsule on 12/29/23   Requesting a call back   Please advise  Thank you

## 2023-12-26 NOTE — Telephone Encounter (Signed)
 Me   12/26/23  8:42 AM Note Spoke to patient who states he did not stop his iron  supplement in preparation for capsule endoscopy until yesterday, 12/25/23. Therefore, we have rescheduled his appointment to the next date he is able to come which is 01/27/24. Patient verbalizes understanding.

## 2023-12-26 NOTE — Telephone Encounter (Signed)
 Spoke to patient who states he did not stop his iron  supplement in preparation for capsule endoscopy until yesterday, 12/25/23. Therefore, we have rescheduled his appointment to the next date he is able to come which is 01/27/24. Patient verbalizes understanding.

## 2023-12-29 ENCOUNTER — Encounter

## 2024-01-01 ENCOUNTER — Other Ambulatory Visit: Payer: Self-pay | Admitting: Cardiovascular Disease

## 2024-01-01 DIAGNOSIS — D0439 Carcinoma in situ of skin of other parts of face: Secondary | ICD-10-CM | POA: Diagnosis not present

## 2024-01-01 DIAGNOSIS — L570 Actinic keratosis: Secondary | ICD-10-CM | POA: Diagnosis not present

## 2024-01-01 DIAGNOSIS — I4819 Other persistent atrial fibrillation: Secondary | ICD-10-CM

## 2024-01-01 NOTE — Telephone Encounter (Signed)
 Prescription refill request for Eliquis  received. Indication: afib  Last office visit: David [name] 06/09/2023 Scr: 2.38, 12/03/2023 Age: 73 yo  Weight: 111 kg   Refill sent.

## 2024-01-16 ENCOUNTER — Other Ambulatory Visit: Payer: Self-pay | Admitting: Internal Medicine

## 2024-01-20 ENCOUNTER — Telehealth: Payer: Self-pay | Admitting: Gastroenterology

## 2024-01-20 NOTE — Telephone Encounter (Signed)
 Pt states he is having 2nd thoughts about VCE and wanted to come in and discuss it first with Dr Stacia at an OV. Pt scheduled to see Dr Stacia 01/28/24@10 :30am. Pt aware of appt and capsule endo appt cancelled. Pt aware.

## 2024-01-20 NOTE — Telephone Encounter (Signed)
 Patient requesting to speak with a nurse in regards to upcoming apt . Please advise.

## 2024-01-27 ENCOUNTER — Encounter

## 2024-01-28 ENCOUNTER — Encounter: Payer: Self-pay | Admitting: Gastroenterology

## 2024-01-28 ENCOUNTER — Other Ambulatory Visit (INDEPENDENT_AMBULATORY_CARE_PROVIDER_SITE_OTHER)

## 2024-01-28 ENCOUNTER — Ambulatory Visit: Admitting: Gastroenterology

## 2024-01-28 VITALS — BP 128/60 | HR 60 | Ht 67.0 in | Wt 240.0 lb

## 2024-01-28 DIAGNOSIS — D509 Iron deficiency anemia, unspecified: Secondary | ICD-10-CM

## 2024-01-28 DIAGNOSIS — N184 Chronic kidney disease, stage 4 (severe): Secondary | ICD-10-CM

## 2024-01-28 DIAGNOSIS — Z860101 Personal history of adenomatous and serrated colon polyps: Secondary | ICD-10-CM | POA: Diagnosis not present

## 2024-01-28 DIAGNOSIS — N183 Chronic kidney disease, stage 3 unspecified: Secondary | ICD-10-CM

## 2024-01-28 LAB — CBC WITH DIFFERENTIAL/PLATELET
Basophils Absolute: 0.1 K/uL (ref 0.0–0.1)
Basophils Relative: 1.6 % (ref 0.0–3.0)
Eosinophils Absolute: 0.5 K/uL (ref 0.0–0.7)
Eosinophils Relative: 9.5 % — ABNORMAL HIGH (ref 0.0–5.0)
HCT: 33.3 % — ABNORMAL LOW (ref 39.0–52.0)
Hemoglobin: 11.1 g/dL — ABNORMAL LOW (ref 13.0–17.0)
Lymphocytes Relative: 16.6 % (ref 12.0–46.0)
Lymphs Abs: 0.9 K/uL (ref 0.7–4.0)
MCHC: 33.5 g/dL (ref 30.0–36.0)
MCV: 89 fl (ref 78.0–100.0)
Monocytes Absolute: 0.5 K/uL (ref 0.1–1.0)
Monocytes Relative: 9.2 % (ref 3.0–12.0)
Neutro Abs: 3.3 K/uL (ref 1.4–7.7)
Neutrophils Relative %: 63.1 % (ref 43.0–77.0)
Platelets: 276 K/uL (ref 150.0–400.0)
RBC: 3.74 Mil/uL — ABNORMAL LOW (ref 4.22–5.81)
RDW: 20.3 % — ABNORMAL HIGH (ref 11.5–15.5)
WBC: 5.2 K/uL (ref 4.0–10.5)

## 2024-01-28 LAB — BASIC METABOLIC PANEL WITH GFR
BUN: 32 mg/dL — ABNORMAL HIGH (ref 6–23)
CO2: 24 meq/L (ref 19–32)
Calcium: 10.3 mg/dL (ref 8.4–10.5)
Chloride: 101 meq/L (ref 96–112)
Creatinine, Ser: 1.8 mg/dL — ABNORMAL HIGH (ref 0.40–1.50)
GFR: 36.41 mL/min — ABNORMAL LOW (ref >60.00)
Glucose, Bld: 140 mg/dL — ABNORMAL HIGH (ref 70–99)
Potassium: 4.1 meq/L (ref 3.5–5.1)
Sodium: 138 meq/L (ref 135–145)

## 2024-01-28 LAB — IBC + FERRITIN
Ferritin: 49.5 ng/mL (ref 22.0–322.0)
Iron: 136 ug/dL (ref 42–165)
Saturation Ratios: 31.5 % (ref 20.0–50.0)
TIBC: 431.2 ug/dL (ref 250.0–450.0)
Transferrin: 308 mg/dL (ref 212.0–360.0)

## 2024-01-28 NOTE — Patient Instructions (Addendum)
 Your provider has requested that you go to the basement level for lab work before leaving today. Press B on the elevator. The lab is located at the first door on the left as you exit the elevator.  Follow the instructions on the Hemoccult cards and mail them back to us  when you are finished or you may take them directly to the lab in the basement of the [place] building. We will call you with the results.    _______________________________________________________  If your blood pressure at your visit was 140/90 or greater, please contact your primary care physician to follow up on this.  _______________________________________________________  If you are age 73 or older, your body mass index should be between 23-30. Your Body mass index is 37.59 kg/m. If this is out of the aforementioned range listed, please consider follow up with your Primary Care Provider.  If you are age 68 or younger, your body mass index should be between 19-25. Your Body mass index is 37.59 kg/m. If this is out of the aformentioned range listed, please consider follow up with your Primary Care Provider.   ________________________________________________________  The [facility] providers would like to encourage you to use MYCHART to communicate with providers for non-urgent requests or questions.  Due to long hold times on the telephone, sending your provider a message by [facility] may be a faster and more efficient way to get a response.  Please allow 48 business hours for a response.  Please remember that this is for non-urgent requests.  _______________________________________________________  [facility] is using a team-based approach to care.  Your team is made up of your doctor and two to three APPS. Our APPS (Nurse Practitioners and Physician Assistants) work with your physician to ensure care continuity for you. They are fully qualified to address your health concerns and develop a treatment plan. They  communicate directly with your gastroenterologist to care for you. Seeing the Advanced Practice Practitioners on your physician's team can help you by facilitating care more promptly, often allowing for earlier appointments, access to diagnostic testing, procedures, and other specialty referrals.

## 2024-01-28 NOTE — Progress Notes (Signed)
 Discussed the use of AI scribe software for clinical note transcription with the patient, who gave verbal consent to proceed.  HPI : David [name] is a 73 year old male with a history of aortic aneurysm status post repair, diabetes, CKD, CVA and A-fib on Eliquis  who presents to clinic for follow-up of recent hospitalization for iron  deficiency anemia. He was found to have had a hemoglobin of 7.5 on routine outpatient labs in July and was sent to the hospital (hemoglobin had been 12 in January 2025). He was found to have worsening renal function with a creatinine of 2.2 up from baseline to 1.5.  An iron  panel was consistent with iron  deficiency anemia, but stool test was negative for occult blood.  He underwent an upper endoscopy which was unremarkable.  As he had had a colonoscopy less than 2 years ago, repeat colonoscopy was not recommended.  He received IV iron  and was discharged on oral iron .  He did not receive a blood transfusion.  His hemoglobin was noted to be eight 1 week after his discharge.  He has not had blood test since then.  It was recommended that he undergo a capsule endoscopy to further evaluate his iron  deficiency anemia.  He was scheduled for this August 25, but was having concerns about it, so he made this appointment instead.  He is currently taking 325 mg of iron  twice daily without gastrointestinal side effects. His stools have become slightly darker, but not black or tarry. He has regular bowel movements and no gastrointestinal symptoms such as nausea, vomiting, or abdominal pain. No weight loss, fatigue, lightheadedness, dizziness, or shortness of breath.  He has chronic kidney disease and is under the care of a nephrologist at [facility]. He is under the care of a nephrologist at [facility] with a follow-up appointment scheduled within the next 2 weeks.  He has concerns about the capsule endoscopy, particularly the risk of the capsule getting stuck in the  gastrointestinal tract. He has not had any bowel obstructions or abdominal surgeries that would increase this risk. He had a triple A repair, but no bowel manipulation was involved.      EGD November 26, 2023 - The examined portions of the nasopharynx, oropharynx and larynx were normal.  - Normal esophagus.  - Normal stomach. Biopsied.  - Normal examined duodenum. Biopsied.  A. DUODENUM, BIOPSY:  Reactive duodenal mucosa with changes compatible with peptic duodenitis   B. GASTRIC BIOPSY:  Reactive gastropathy with focal activity and minimal chronic gastritis  Negative for H. pylori, intestinal metaplasia, dysplasia and carcinoma   Colonoscopy 03/26/2022:  - Four 6 to 7 mm polyps in the transverse colon and in the ascending colon, removed with a cold snare. Resected and retrieved.  - Moderate diverticulosis in the entire examined colon, more concentrated in the left colon. - Internal hemorrhoids - Recall colonoscopy 3 years - TUBULAR ADENOMA(S) (4 FRAGMENTS) - NEGATIVE FOR HIGH-GRADE DYSPLASIA OR MALIGNANCY - SEE NOTE   Colonoscopy 10/20/2014: 1. Two sessile polyps in the sigmoid colon and transverse colon; polypectomies performed with a cold snare  2. Moderate diverticulosis in the descending colon and sigmoid colon  3. Mild diverticulosis in the transverse colon  4. Grade Il internal hemorrhoids and moderate external hemorrhoids HYPERPLASTIC POLYP(S). 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   Anticardiolipin antibody positive    Anxiety    ANXIETY 07/19/2008   CEREBROVASCULAR ACCIDENT, HX OF 07/19/2008   CVA (cerebral vascular accident) (HCC) 2003   Depression    DEPRESSION 07/19/2008   Diabetes mellitus    type II   DIABETES MELLITUS, TYPE II 01/18/2008   Diverticulosis    GERD 07/19/2008   GERD (gastroesophageal reflux disease)    Hemorrhoids    Hx of adenomatous colonic polyps    Hyperlipidemia    HYPERLIPIDEMIA 07/19/2008   Hypertension    HYPERTENSION 07/31/2009   Increased prostate specific antigen (PSA) velocity 09/13/2010   Internal hemorrhoids    Mitral regurgitation    Echo 9/21: EF 55-60, no RWMA, normal RVSF, mild-moderate LAE, mild-moderate MR, mild aortic stenosis (mean gradient 9.2 mmHg)   Perineal abscess    Peripheral vascular disease    peripheral vascular disease/carotid artery <100%   PERSONAL HX COLONIC POLYPS 12/10/2007   Unspecified Peripheral Vascular Disease 01/18/2008   Varicose veins      Past Surgical History:  Procedure Laterality Date   7 abdominal hernia repairs  05/31/10   ABDOMINAL AORTAGRAM N/A 01/12/2013   Procedure: ABDOMINAL EZELLA;  Surgeon: Gaile LELON New, MD;  Location: [facility] CATH LAB;  Service: Cardiovascular;  Laterality: N/A;   ABDOMINAL AORTIC ANEURYSM REPAIR     ABDOMINAL AORTOGRAM W/LOWER EXTREMITY Bilateral 10/05/2019   Procedure: ABDOMINAL AORTOGRAM W/LOWER EXTREMITY;  Surgeon: New Gaile LELON, MD;  Location: [facility] INVASIVE CV LAB;  Service: Cardiovascular;  Laterality: Bilateral;   COLONOSCOPY     ESOPHAGOGASTRODUODENOSCOPY N/A 11/26/2023   Procedure: EGD (ESOPHAGOGASTRODUODENOSCOPY);  Surgeon: Stacia Glendia BRAVO, MD;  Location: [facility];  Service: Gastroenterology;  Laterality: N/A;   hernia surgury     73 years old   perineal abscess     PERIPHERAL VASCULAR CATHETERIZATION N/A 11/23/2014   Procedure: Abdominal Aortogram;  Surgeon: Gaile LELON New, MD;   Location: [facility] INVASIVE CV LAB;  Service: Cardiovascular;  Laterality: N/A;   PERIPHERAL VASCULAR CATHETERIZATION  11/23/2014   Procedure: Lower Extremity Angiography;  Surgeon: Gaile LELON New, MD;  Location: [facility] INVASIVE CV LAB;  Service: Cardiovascular;;   PERIPHERAL VASCULAR CATHETERIZATION  11/23/2014   Procedure: Peripheral Vascular Intervention;  Surgeon: Gaile LELON New, MD;  [facility] INVASIVE CV LAB;  Service: Cardiovascular;;  PTA bilateral common iliac PTA/DCB left fem-pop   POLYPECTOMY     s/p right and left popliteal aneurysm repair     SP ENDO REPAIR INFRAREN AAA     Family History  Problem Relation Age of Onset   Aortic aneurysm Mother        desceding aoritic aneurysm   Cancer Brother        Lung cancer   Colon cancer Neg Hx    Esophageal cancer Neg Hx    Stomach cancer Neg Hx    Rectal cancer Neg Hx    Social History  Tobacco Use   Smoking status: Former    Current packs/day: 0.00    Types: Cigarettes    Quit date: 01/14/1992    Years since quitting: 32.0   Smokeless tobacco: Never  Vaping Use   Vaping status: Never Used  Substance Use Topics   Alcohol use: Yes    Alcohol/week: 2.0 standard drinks of alcohol    Types: 2 Glasses of wine per week    Comment: wine daily   Drug use: No   Current Outpatient Medications  Medication Sig Dispense Refill   amLODipine  (NORVASC ) 10 MG tablet Take 1 tablet (10 mg total) by mouth daily. 90 tablet 1   apixaban  (ELIQUIS ) 5 MG TABS tablet TAKE 1 TABLET TWICE A DAY 180 tablet 1   Ascorbic Acid (VITAMIN C) 1000 MG tablet Take 1,000 mg by mouth daily.     aspirin EC 81 MG tablet Take 81 mg by mouth daily. Swallow whole.     atorvastatin  (LIPITOR) 40 MG tablet TAKE ONE TABLET BY MOUTH ONCE DAILY 90 tablet 2   cetirizine  (ZYRTEC ) 10 MG tablet Take 1 tablet (10 mg total) by mouth daily. 90 tablet 3   Cholecalciferol (VITAMIN D ) 50 MCG (2000 UT) tablet Take 2,000 Units by mouth in the morning and at bedtime.     docusate  sodium (COLACE) 100 MG capsule Take 100 mg by mouth 2 (two) times daily.     famotidine (PEPCID) 20 MG tablet Take 20 mg by mouth 2 (two) times daily.     FARXIGA  10 MG TABS tablet Take 10 mg by mouth daily.     ferrous gluconate  (FERGON) 324 MG tablet Take 1 tablet (324 mg total) by mouth 2 (two) times daily with a meal. 180 tablet 0   folic acid  (FOLVITE ) 800 MCG tablet Take 800 mcg by mouth daily.     furosemide  (LASIX ) 40 MG tablet Take 40 mg by mouth daily.     gemfibrozil  (LOPID ) 600 MG tablet TAKE TWO TABLETS BY MOUTH ONCE DAILY 180 tablet 2   metFORMIN  (GLUCOPHAGE -XR) 500 MG 24 hr tablet TAKE 4 TABLETS BY MOUTH EVERY MORNING 360 tablet 1   olmesartan  (BENICAR ) 40 MG tablet Take 1 tablet (40 mg total) by mouth daily. Follow-up appt due in August must see provider for future refills 90 tablet 0   Omega-3 Fatty Acids (FISH OIL) 1000 MG CAPS Take 1,000 mg by mouth daily.     psyllium (FIBER LAXATIVE) 0.52 g capsule Take 3.36 g by mouth daily with lunch.     triamcinolone  (NASACORT ) 55 MCG/ACT AERO nasal inhaler Place 2 sprays into the nose daily. (Patient taking differently: Place 1 spray into the nose daily as needed (allergies).) 1 Inhaler 12   No current facility-administered medications for this visit.   Allergies  Allergen Reactions   Latex Rash     Review of Systems: All systems reviewed and negative except where noted in HPI.    No results found.  Physical Exam: BP 128/60 (BP Location: Left Arm, Patient Position: Sitting, Cuff Size: Normal)   Pulse 60   Ht 5' 7 (1.702 m)   Wt 240 lb (108.9 kg)   BMI 37.59 kg/m  Constitutional: Pleasant,well-developed, pale Caucasian male in no acute distress. HEENT: Normocephalic and atraumatic. Conjunctivae are normal. No scleral icterus. Neck supple.  Cardiovascular: Normal rate, regular rhythm.  Pulmonary/chest: Effort normal and breath sounds normal. No wheezing, rales or rhonchi. Abdominal: Soft, nondistended, nontender. Bowel  sounds active throughout. There are no  masses palpable. No hepatomegaly. Extremities: Trace bilateral pretibial edema Neurological: Alert and oriented to person place and time. Skin: Skin is warm and dry. No rashes noted. Psychiatric: Normal mood and affect. Behavior is normal.  CBC    Component Value Date/Time   WBC 4.5 12/03/2023 0846   RBC 2.91 (L) 12/03/2023 0846   HGB 8.0 Repeated and verified X2. (LL) 12/03/2023 0846   HGB 11.8 (L) 10/21/2017 1304   HCT 24.9 Repeated and verified X2. (L) 12/03/2023 0846   PLT 240.0 12/03/2023 0846   PLT 202 10/21/2017 1304   MCV 85.7 12/03/2023 0846   MCH 27.5 11/26/2023 0524   MCHC 32.2 12/03/2023 0846   RDW 19.4 (H) 12/03/2023 0846   LYMPHSABS 0.9 12/03/2023 0846   MONOABS 0.6 12/03/2023 0846   EOSABS 0.6 12/03/2023 0846   BASOSABS 0.1 12/03/2023 0846    CMP     Component Value Date/Time   NA 137 12/03/2023 0846   NA 139 05/29/2020 0000   K 4.6 12/03/2023 0846   CL 103 12/03/2023 0846   CO2 22 12/03/2023 0846   GLUCOSE 133 (H) 12/03/2023 0846   BUN 40 (H) 12/03/2023 0846   BUN 26 (A) 05/29/2020 0000   CREATININE 2.38 (H) 12/03/2023 0846   CREATININE 1.56 (H) 11/22/2022 0956   CALCIUM  10.0 12/03/2023 0846   PROT 7.8 12/03/2023 0846   PROT 7.1 07/17/2017 1154   ALBUMIN 4.4 12/03/2023 0846   AST 22 12/03/2023 0846   AST 25 10/21/2017 1304   ALT 13 12/03/2023 0846   ALT 20 10/21/2017 1304   ALKPHOS 31 (L) 12/03/2023 0846   BILITOT 0.3 12/03/2023 0846   BILITOT 0.5 10/21/2017 1304   GFRNONAA 29 (L) 11/26/2023 0524   GFRNONAA 64 01/13/2020 1050   GFRAA 78 05/29/2020 0000   GFRAA 74 01/13/2020 1050       Latest Ref Rng & Units 12/03/2023    8:46 AM 11/26/2023    5:24 AM 11/25/2023    9:22 PM  CBC EXTENDED  WBC 4.0 - 10.5 K/uL 4.5  5.8    RBC 4.22 - 5.81 Mil/uL 2.91  2.84    Hemoglobin 13.0 - 17.0 g/dL 8.0 Repeated and verified X2.  7.8  7.6   HCT 39.0 - 52.0 % 24.9 Repeated and verified X2.  25.3  25.4   Platelets 150.0  - 400.0 K/uL 240.0  229    NEUT# 1.4 - 7.7 K/uL 2.4     Lymph# 0.7 - 4.0 K/uL 0.9         ASSESSMENT AND PLAN:  73 year old male with history of atrial fibrillation on Eliquis , CKD and history of abdominal aortic aneurysm repair, with recent admission for iron  deficiency anemia.  Hemoccult stool negative.  No overt GI bleeding.  Iron  panel consistent with iron  deficiency, but patient also had significant decline in renal function during same period of time.  EGD during his admission unrevealing.  He had colonoscopy less than 2 years ago unrevealing for source of chronic GI blood loss.  Capsule endoscopy recommended, but patient has concerns and is hesitant to undergo study due to concerns about capsule retention/obstruction.  I provided reassurance about the safety of capsule endoscopy in patients that have had no history of bowel obstruction or extensive abdominal surgical history, but was willing to defer capsule endoscopy for now given negative FOBT and absence of overt GI bleeding..  Iron  deficiency anemia Hemoglobin dropped from 12 to 7.5 over six months. Low iron  levels. Negative stool test  for blood.  Kidney disease may likely contributing to anemia, but would not necessarily explain iron  deficiency.  Overall, most suspicious for small bowel AVMs as the source of his anemia.  We discussed this as a possibility, as well as the lack of definitive therapy if he has numerous AVMs deep within the small intestine. - Repeat CBC and iron  panel. - Provide stool collection kit for home use. - Hold off on capsule endoscopy until lab results reviewed. - If stool test positive for blood, would strongly urged patient to reconsider capsule endoscopy to evaluate for small bowel AVMs. - Continue oral iron  for now, can likely decrease to once daily.  Will await iron  panel  Chronic kidney disease Stage 3-4 Likely contributing to anemia.  Kidney function worsened since January. Follows with nephrologist Dr.  Constantine. - Repeat BMP. - Ensure nephrologist receives relevant information. - Defer consideration of EPO to nephrology  Atrial fibrillation - On Eliquis   Recording duration: 14 minutes      Demitrios Molyneux E. Stacia, MD [facility]  I spent a total of 30 minutes reviewing the patient's medical record, interviewing and examining the patient, discussing his diagnosis and management of his condition going forward, and documenting in the medical record   CC:  Norleen Lynwood ORN, MD

## 2024-02-01 ENCOUNTER — Ambulatory Visit: Payer: Self-pay | Admitting: Gastroenterology

## 2024-02-01 NOTE — Progress Notes (Signed)
 David [name],  Your hemoglobin was significantly improved and your iron  levels are now normal.  I think you can stop taking your iron  pill.   Will await stool test results.  I would like to repeat a CBC and iron  panel in 3 months.  Alan,  Please place order/reminder for CBC/iron  panel in 3 months

## 2024-02-02 ENCOUNTER — Encounter: Payer: Self-pay | Admitting: Podiatry

## 2024-02-02 ENCOUNTER — Ambulatory Visit: Admitting: Podiatry

## 2024-02-02 DIAGNOSIS — M79674 Pain in right toe(s): Secondary | ICD-10-CM | POA: Diagnosis not present

## 2024-02-02 DIAGNOSIS — B351 Tinea unguium: Secondary | ICD-10-CM

## 2024-02-02 DIAGNOSIS — L84 Corns and callosities: Secondary | ICD-10-CM

## 2024-02-02 DIAGNOSIS — M79675 Pain in left toe(s): Secondary | ICD-10-CM | POA: Diagnosis not present

## 2024-02-02 DIAGNOSIS — E1121 Type 2 diabetes mellitus with diabetic nephropathy: Secondary | ICD-10-CM

## 2024-02-02 NOTE — Progress Notes (Signed)
  Subjective:  Patient ID: David [name], male    DOB: Apr 17, 1951,   MRN: [id number]  Chief Complaint  Patient presents with   Diabetes    Trim the toenails.  Dr. Lynwood Hopper - 12/03/2023; A1c - 6.4    73 y.o. male presents for concern of thickened elongated and painful nails that are difficult to trim. Requesting to have them trimmed today. Relates burning and tingling in their feet. Patient is diabetic and last A1c was  Lab Results  Component Value Date   HGBA1C 6.0 11/24/2023   .   PCP:  Norleen Lynwood ORN, MD    . Denies any other pedal complaints. Denies n/v/f/c.   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   Anticardiolipin antibody positive    Anxiety    ANXIETY 07/19/2008   CEREBROVASCULAR ACCIDENT, HX OF 07/19/2008   CVA (cerebral vascular accident) (HCC) 2003   Depression    DEPRESSION 07/19/2008   Diabetes mellitus    type II   DIABETES MELLITUS, TYPE II 01/18/2008   Diverticulosis    GERD 07/19/2008   GERD (gastroesophageal reflux disease)    Hemorrhoids    Hx of adenomatous colonic polyps    Hyperlipidemia    HYPERLIPIDEMIA 07/19/2008   Hypertension    HYPERTENSION 07/31/2009   Increased prostate specific antigen (PSA) velocity 09/13/2010   Internal hemorrhoids    Mitral regurgitation    Echo 9/21: EF 55-60, no RWMA, normal RVSF, mild-moderate LAE, mild-moderate MR, mild aortic stenosis (mean gradient 9.2 mmHg)   Perineal abscess    Peripheral vascular disease    peripheral vascular disease/carotid artery <100%   PERSONAL HX COLONIC POLYPS 12/10/2007   Unspecified Peripheral Vascular Disease 01/18/2008   Varicose veins     Objective:  Physical Exam: Vascular: DP/PT pulses 2/4 bilateral. CFT <3 seconds. Absent hair growth on digits. Edema noted to bilateral lower extremities. Xerosis noted bilaterally.  Skin. No lacerations or abrasions bilateral  feet. Nails 1-5 bilateral  are thickened discolored and elongated with subungual debris. Hyperkeratotic lesion noted sub fifth metatarsal base on left and sub first metatarsal head on right  Musculoskeletal: MMT 5/5 bilateral lower extremities in DF, PF, Inversion and Eversion. Deceased ROM in DF of ankle joint.  Neurological: Sensation intact to light touch. Protective sensation diminished bilateral.     Assessment:   1. Pain due to onychomycosis of toenails of both feet   2. Type 2 diabetes mellitus with diabetic nephropathy, without long-term current use of insulin  (HCC)   3. Callus       Plan:  Patient was evaluated and treated and all questions answered. -Discussed and educated patient on diabetic foot care, especially with  regards to the vascular, neurological and musculoskeletal systems.  -Stressed the importance of good glycemic control and the detriment of not  controlling glucose levels in relation to the foot. -Discussed supportive shoes at all times and checking feet regularly.  -Mechanically debrided all nails 1-5 bilateral using sterile nail nipper and filed with dremel without incident  -Hyperkeratotic tissue debrided without incident with chisel x 2  -Answered all patient questions -Patient to return  in 4 months for at risk foot care -Patient advised to call the office if any problems or questions arise in the meantime.   Asberry Failing, DPM

## 2024-02-09 DIAGNOSIS — R809 Proteinuria, unspecified: Secondary | ICD-10-CM | POA: Diagnosis not present

## 2024-02-09 DIAGNOSIS — N189 Chronic kidney disease, unspecified: Secondary | ICD-10-CM | POA: Diagnosis not present

## 2024-02-09 DIAGNOSIS — E559 Vitamin D deficiency, unspecified: Secondary | ICD-10-CM | POA: Diagnosis not present

## 2024-02-09 DIAGNOSIS — N1831 Chronic kidney disease, stage 3a: Secondary | ICD-10-CM | POA: Diagnosis not present

## 2024-02-09 DIAGNOSIS — D631 Anemia in chronic kidney disease: Secondary | ICD-10-CM | POA: Diagnosis not present

## 2024-02-09 DIAGNOSIS — I129 Hypertensive chronic kidney disease with stage 1 through stage 4 chronic kidney disease, or unspecified chronic kidney disease: Secondary | ICD-10-CM | POA: Diagnosis not present

## 2024-02-09 DIAGNOSIS — R609 Edema, unspecified: Secondary | ICD-10-CM | POA: Diagnosis not present

## 2024-02-09 DIAGNOSIS — E1129 Type 2 diabetes mellitus with other diabetic kidney complication: Secondary | ICD-10-CM | POA: Diagnosis not present

## 2024-03-09 ENCOUNTER — Ambulatory Visit: Admitting: Internal Medicine

## 2024-03-09 ENCOUNTER — Encounter: Payer: Self-pay | Admitting: Internal Medicine

## 2024-03-09 VITALS — BP 136/80 | HR 56 | Temp 98.2°F | Ht 67.0 in | Wt 237.0 lb

## 2024-03-09 DIAGNOSIS — E538 Deficiency of other specified B group vitamins: Secondary | ICD-10-CM

## 2024-03-09 DIAGNOSIS — D509 Iron deficiency anemia, unspecified: Secondary | ICD-10-CM | POA: Diagnosis not present

## 2024-03-09 DIAGNOSIS — Z23 Encounter for immunization: Secondary | ICD-10-CM | POA: Diagnosis not present

## 2024-03-09 DIAGNOSIS — E7800 Pure hypercholesterolemia, unspecified: Secondary | ICD-10-CM | POA: Diagnosis not present

## 2024-03-09 DIAGNOSIS — E559 Vitamin D deficiency, unspecified: Secondary | ICD-10-CM | POA: Diagnosis not present

## 2024-03-09 DIAGNOSIS — I10 Essential (primary) hypertension: Secondary | ICD-10-CM | POA: Diagnosis not present

## 2024-03-09 DIAGNOSIS — R972 Elevated prostate specific antigen [PSA]: Secondary | ICD-10-CM

## 2024-03-09 DIAGNOSIS — E1122 Type 2 diabetes mellitus with diabetic chronic kidney disease: Secondary | ICD-10-CM

## 2024-03-09 DIAGNOSIS — N1832 Chronic kidney disease, stage 3b: Secondary | ICD-10-CM

## 2024-03-09 DIAGNOSIS — Z7984 Long term (current) use of oral hypoglycemic drugs: Secondary | ICD-10-CM

## 2024-03-09 NOTE — Assessment & Plan Note (Signed)
 Lab Results  Component Value Date   VITAMINB12 717 11/25/2023   Stable, cont oral replacement - b12 1000 mcg qd

## 2024-03-09 NOTE — Assessment & Plan Note (Signed)
 No overt bleeding, also for f/u iron  lab today

## 2024-03-09 NOTE — Assessment & Plan Note (Addendum)
 With ckd3b, non insulin   Lab Results  Component Value Date   HGBA1C 6.0 11/24/2023   Stable, pt to continue current medical treatment farxiga  10 every day, metformin  ER 500  - 4 qd

## 2024-03-09 NOTE — Progress Notes (Signed)
 Patient ID: David [name], male   DOB: 1951-05-03, 73 y.o.   MRN: [id number]        Chief Complaint: follow up iron  deficiency anemia, low vit d and b12, dm, hld       HPI:  David [name] is a 73 y.o. male here overall doing ok, Pt denies chest pain, increased sob or doe, wheezing, orthopnea, PND, increased LE swelling, palpitations, dizziness or syncope.   Pt denies polydipsia, polyuria, or new focal neuro s/s.    Pt denies fever, wt loss, night sweats, loss of appetite, or other constitutional symptoms  No overt bleeding, Still taking iron  oral.  Most recent Hgb 11.4 with renal Dr Elinda, A1c 5.7 , and renal fxn some improved by pt report, also for flu shot today.   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   Anticardiolipin antibody positive    Anxiety    ANXIETY 07/19/2008   CEREBROVASCULAR ACCIDENT, HX OF 07/19/2008   CVA (cerebral vascular accident) (HCC) 2003   Depression    DEPRESSION 07/19/2008   Diabetes mellitus    type II   DIABETES MELLITUS, TYPE II 01/18/2008   Diverticulosis    GERD 07/19/2008   GERD (gastroesophageal reflux disease)    Hemorrhoids    Hx of adenomatous colonic polyps    Hyperlipidemia    HYPERLIPIDEMIA 07/19/2008   Hypertension    HYPERTENSION 07/31/2009   Increased prostate specific antigen (PSA) velocity 09/13/2010   Internal hemorrhoids    Mitral regurgitation    Echo 9/21: EF 55-60, no RWMA, normal RVSF, mild-moderate LAE, mild-moderate MR, mild aortic stenosis (mean gradient 9.2 mmHg)   Perineal abscess    Peripheral vascular disease    peripheral vascular disease/carotid artery <100%   PERSONAL HX COLONIC  POLYPS 12/10/2007   Unspecified Peripheral Vascular Disease 01/18/2008   Varicose veins    Past Surgical History:  Procedure Laterality Date   7 abdominal hernia repairs  05/31/10   ABDOMINAL AORTAGRAM N/A 01/12/2013   Procedure: ABDOMINAL EZELLA;  Surgeon: Gaile LELON New, MD;  Location: [facility] CATH LAB;  Service: Cardiovascular;  Laterality: N/A;   ABDOMINAL AORTIC ANEURYSM REPAIR     ABDOMINAL AORTOGRAM W/LOWER EXTREMITY Bilateral 10/05/2019   Procedure: ABDOMINAL AORTOGRAM W/LOWER EXTREMITY;  Surgeon: New Gaile LELON, MD;  Location: [facility] INVASIVE CV LAB;  Service: Cardiovascular;  Laterality: Bilateral;   COLONOSCOPY     ESOPHAGOGASTRODUODENOSCOPY N/A 11/26/2023   Procedure: EGD (ESOPHAGOGASTRODUODENOSCOPY);  Surgeon: Stacia Glendia BRAVO, MD;  Location: [facility];  Service: Gastroenterology;  Laterality: N/A;   hernia surgury     73 years old   perineal abscess     PERIPHERAL VASCULAR CATHETERIZATION N/A 11/23/2014   Procedure: Abdominal Aortogram;  Surgeon: Gaile LELON New, MD;  Location: [facility] INVASIVE CV LAB;  Service: Cardiovascular;  Laterality: N/A;   PERIPHERAL VASCULAR CATHETERIZATION  11/23/2014   Procedure: Lower Extremity Angiography;  Surgeon: Gaile LELON New, MD;  Location: [facility]VASIVE CV LAB;  Service: Cardiovascular;;   PERIPHERAL VASCULAR CATHETERIZATION  11/23/2014   Procedure: Peripheral Vascular Intervention;  Surgeon: Gaile LELON  Serene, MD;  Location: [facility] INVASIVE CV LAB;  Service: Cardiovascular;;  PTA bilateral common iliac PTA/DCB left fem-pop   POLYPECTOMY     s/p right and left popliteal aneurysm repair     SP ENDO REPAIR INFRAREN AAA      reports that he quit smoking about 32 years ago. His smoking use included cigarettes. He has never used smokeless tobacco. He reports current alcohol use of about 2.0 standard drinks of alcohol per week. He reports that he does not use drugs. family history includes Aortic aneurysm in his mother; Cancer in his brother. Allergies  Allergen  Reactions   Latex Rash   Current Outpatient Medications on File Prior to Visit  Medication Sig Dispense Refill   amLODipine  (NORVASC ) 10 MG tablet Take 1 tablet (10 mg total) by mouth daily. 90 tablet 1   apixaban  (ELIQUIS ) 5 MG TABS tablet TAKE 1 TABLET TWICE A DAY 180 tablet 1   Ascorbic Acid (VITAMIN C) 1000 MG tablet Take 1,000 mg by mouth daily.     aspirin EC 81 MG tablet Take 81 mg by mouth daily. Swallow whole.     atorvastatin  (LIPITOR) 40 MG tablet TAKE ONE TABLET BY MOUTH ONCE DAILY 90 tablet 2   cetirizine  (ZYRTEC ) 10 MG tablet Take 1 tablet (10 mg total) by mouth daily. 90 tablet 3   Cholecalciferol (VITAMIN D ) 50 MCG (2000 UT) tablet Take 2,000 Units by mouth in the morning and at bedtime.     docusate sodium  (COLACE) 100 MG capsule Take 100 mg by mouth 2 (two) times daily.     famotidine (PEPCID) 20 MG tablet Take 20 mg by mouth 2 (two) times daily.     FARXIGA  10 MG TABS tablet Take 10 mg by mouth daily.     folic acid  (FOLVITE ) 800 MCG tablet Take 800 mcg by mouth daily.     furosemide  (LASIX ) 40 MG tablet Take 40 mg by mouth daily.     gemfibrozil  (LOPID ) 600 MG tablet TAKE TWO TABLETS BY MOUTH ONCE DAILY 180 tablet 2   metFORMIN  (GLUCOPHAGE -XR) 500 MG 24 hr tablet TAKE 4 TABLETS BY MOUTH EVERY MORNING 360 tablet 1   olmesartan  (BENICAR ) 40 MG tablet Take 1 tablet (40 mg total) by mouth daily. Follow-up appt due in August must see provider for future refills 90 tablet 0   Omega-3 Fatty Acids (FISH OIL) 1000 MG CAPS Take 1,000 mg by mouth daily.     psyllium (FIBER LAXATIVE) 0.52 g capsule Take 3.36 g by mouth daily with lunch.     triamcinolone  (NASACORT ) 55 MCG/ACT AERO nasal inhaler Place 2 sprays into the nose daily. (Patient taking differently: Place 1 spray into the nose daily as needed (allergies).) 1 Inhaler 12   ferrous gluconate  (FERGON) 324 MG tablet Take 1 tablet (324 mg total) by mouth 2 (two) times daily with a meal. 180 tablet 0   No current  facility-administered medications on file prior to visit.        ROS:  All others reviewed and negative.  Objective        PE:  BP 136/80 (BP Location: Right Arm, Patient Position: Sitting, Cuff Size: Normal)   Pulse (!) 56   Temp 98.2 F (36.8 C) (Oral)   Ht 5' 7 (1.702 m)   Wt 237 lb (107.5 kg)   SpO2 98%   BMI 37.12 kg/m                 Constitutional: Pt  appears in NAD               HENT: Head: NCAT.                Right Ear: External ear normal.                 Left Ear: External ear normal.                Eyes: . Pupils are equal, round, and reactive to light. Conjunctivae and EOM are normal               Nose: without d/c or deformity               Neck: Neck supple. Gross normal ROM               Cardiovascular: Normal rate and regular rhythm.                 Pulmonary/Chest: Effort normal and breath sounds without rales or wheezing.                Abd:  Soft, NT, ND, + BS, no organomegaly               Neurological: Pt is alert. At baseline orientation, motor grossly intact               Skin: Skin is warm. No rashes, no other new lesions, LE edema - none               Psychiatric: Pt behavior is normal without agitation   Micro: none  Cardiac tracings I have personally interpreted today:  none  Pertinent Radiological findings (summarize): none   Lab Results  Component Value Date   WBC 5.2 01/28/2024   HGB 11.1 (L) 01/28/2024   HCT 33.3 (L) 01/28/2024   PLT 276.0 01/28/2024   GLUCOSE 140 (H) 01/28/2024   CHOL 118 11/24/2023   TRIG 190.0 (H) 11/24/2023   HDL 30.10 (L) 11/24/2023   LDLDIRECT 64.0 05/24/2022   LDLCALC 50 11/24/2023   ALT 13 12/03/2023   AST 22 12/03/2023   NA 138 01/28/2024   K 4.1 01/28/2024   CL 101 01/28/2024   CREATININE 1.80 (H) 01/28/2024   BUN 32 (H) 01/28/2024   CO2 24 01/28/2024   TSH 1.86 05/27/2023   PSA 1.36 05/27/2023   INR 1.3 (H) 11/24/2023   HGBA1C 6.0 11/24/2023   MICROALBUR 3.9 (H) 03/16/2010   Assessment/Plan:   David [name] is a 73 y.o. 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09/07/2008), Anticardiolipin antibody positive, Anxiety, ANXIETY (07/19/2008), CEREBROVASCULAR ACCIDENT, HX OF (07/19/2008), CVA (cerebral vascular accident) (HCC) (2003), Depression, DEPRESSION (07/19/2008), Diabetes mellitus, DIABETES MELLITUS, TYPE II (01/18/2008), Diverticulosis, GERD (07/19/2008), GERD (gastroesophageal reflux disease), Hemorrhoids, adenomatous colonic polyps, Hyperlipidemia, HYPERLIPIDEMIA (07/19/2008), Hypertension, HYPERTENSION (07/31/2009), Increased prostate specific antigen (PSA) velocity (09/13/2010), Internal hemorrhoids, Mitral regurgitation, Perineal abscess, Peripheral vascular disease, PERSONAL HX COLONIC POLYPS (12/10/2007), Unspecified Peripheral Vascular Disease (01/18/2008), and Varicose veins.  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, non insulin   Lab Results  Component Value Date   HGBA1C 6.0 11/24/2023   Stable, pt to continue current medical treatment farxiga  10 every day, metformin  ER 500  - 4 qd   Vitamin D  deficiency Last vitamin D  Lab Results  Component Value Date  VD25OH 39.52 11/24/2023   Low, to start oral replacement   Iron  deficiency anemia No overt bleeding, also for f/u iron  lab today  HLD (hyperlipidemia) Lab Results  Component Value Date   LDLCALC 50 11/24/2023   Stable, pt to continue current statin lipitor 40 mg every day, and f/u lab   Essential hypertension BP Readings from Last 3 Encounters:  03/09/24 136/80  01/28/24 128/60  12/03/23 122/62   Stable, pt to continue medical treatment norvasc  10 every day,  benicar  40 qd   B12 deficiency Lab Results  Component Value Date   VITAMINB12 717 11/25/2023   Stable, cont oral replacement - b12 1000 mcg qd   Followup: No follow-ups on  file.  Lynwood Rush, MD 03/09/2024 9:36 PM [facility]

## 2024-03-09 NOTE — Patient Instructions (Addendum)
 You had the flu shot today  Please continue all other medications as before, and refills have been done if requested.  Please have the pharmacy call with any other refills you may need.  Please continue your efforts at being more active, low cholesterol diet, and weight control.  Please keep your appointments with your specialists as you may have planned - renal, cardiology as you have planned  Please make an Appointment to return in Jan 2026, or sooner if needed, also with Lab Appointment for testing done 3-5 days before at the FIRST FLOOR Lab (so this is for TWO appointments - please see the scheduling desk as you leave)

## 2024-03-09 NOTE — Assessment & Plan Note (Signed)
 Lab Results  Component Value Date   LDLCALC 50 11/24/2023   Stable, pt to continue current statin lipitor 40 mg every day, and f/u lab

## 2024-03-09 NOTE — Assessment & Plan Note (Signed)
 Last vitamin D  Lab Results  Component Value Date   VD25OH 39.52 11/24/2023   Low, to start oral replacement

## 2024-03-09 NOTE — Assessment & Plan Note (Signed)
 BP Readings from Last 3 Encounters:  03/09/24 136/80  01/28/24 128/60  12/03/23 122/62   Stable, pt to continue medical treatment norvasc  10 every day,  benicar  40 qd

## 2024-04-16 ENCOUNTER — Other Ambulatory Visit: Payer: Self-pay | Admitting: Internal Medicine

## 2024-04-19 ENCOUNTER — Other Ambulatory Visit: Payer: Self-pay

## 2024-04-26 ENCOUNTER — Other Ambulatory Visit: Payer: Self-pay

## 2024-04-26 DIAGNOSIS — D509 Iron deficiency anemia, unspecified: Secondary | ICD-10-CM

## 2024-05-19 ENCOUNTER — Other Ambulatory Visit (INDEPENDENT_AMBULATORY_CARE_PROVIDER_SITE_OTHER)

## 2024-05-19 ENCOUNTER — Ambulatory Visit: Payer: Self-pay | Admitting: Gastroenterology

## 2024-05-19 DIAGNOSIS — N1832 Chronic kidney disease, stage 3b: Secondary | ICD-10-CM

## 2024-05-19 DIAGNOSIS — E559 Vitamin D deficiency, unspecified: Secondary | ICD-10-CM | POA: Diagnosis not present

## 2024-05-19 DIAGNOSIS — D509 Iron deficiency anemia, unspecified: Secondary | ICD-10-CM

## 2024-05-19 DIAGNOSIS — E538 Deficiency of other specified B group vitamins: Secondary | ICD-10-CM | POA: Diagnosis not present

## 2024-05-19 DIAGNOSIS — R972 Elevated prostate specific antigen [PSA]: Secondary | ICD-10-CM | POA: Diagnosis not present

## 2024-05-19 DIAGNOSIS — E1122 Type 2 diabetes mellitus with diabetic chronic kidney disease: Secondary | ICD-10-CM | POA: Diagnosis not present

## 2024-05-19 LAB — CBC WITH DIFFERENTIAL/PLATELET
Basophils Absolute: 0.1 K/uL (ref 0.0–0.1)
Basophils Absolute: 0.1 K/uL (ref 0.0–0.1)
Basophils Relative: 2 % (ref 0.0–3.0)
Basophils Relative: 2.1 % (ref 0.0–3.0)
Eosinophils Absolute: 0.5 K/uL (ref 0.0–0.7)
Eosinophils Absolute: 0.6 K/uL (ref 0.0–0.7)
Eosinophils Relative: 10.1 % — ABNORMAL HIGH (ref 0.0–5.0)
Eosinophils Relative: 10.7 % — ABNORMAL HIGH (ref 0.0–5.0)
HCT: 33.1 % — ABNORMAL LOW (ref 39.0–52.0)
HCT: 32.8 % — ABNORMAL LOW (ref 39.0–52.0)
Hemoglobin: 11.3 g/dL — ABNORMAL LOW (ref 13.0–17.0)
Hemoglobin: 11.5 g/dL — ABNORMAL LOW (ref 13.0–17.0)
Lymphocytes Relative: 16.6 % (ref 12.0–46.0)
Lymphocytes Relative: 16.5 % (ref 12.0–46.0)
Lymphs Abs: 0.9 K/uL (ref 0.7–4.0)
Lymphs Abs: 0.9 K/uL (ref 0.7–4.0)
MCHC: 34.2 g/dL (ref 30.0–36.0)
MCHC: 35.2 g/dL (ref 30.0–36.0)
MCV: 98.1 fl (ref 78.0–100.0)
MCV: 97.7 fl (ref 78.0–100.0)
Monocytes Absolute: 0.6 K/uL (ref 0.1–1.0)
Monocytes Absolute: 0.7 K/uL (ref 0.1–1.0)
Monocytes Relative: 12.4 % — ABNORMAL HIGH (ref 3.0–12.0)
Monocytes Relative: 12.5 % — ABNORMAL HIGH (ref 3.0–12.0)
Neutro Abs: 3 K/uL (ref 1.4–7.7)
Neutro Abs: 3 K/uL (ref 1.4–7.7)
Neutrophils Relative %: 58.9 % (ref 43.0–77.0)
Neutrophils Relative %: 58.2 % (ref 43.0–77.0)
Platelets: 202 K/uL (ref 150.0–400.0)
Platelets: 204 K/uL (ref 150.0–400.0)
RBC: 3.37 Mil/uL — ABNORMAL LOW (ref 4.22–5.81)
RBC: 3.35 Mil/uL — ABNORMAL LOW (ref 4.22–5.81)
RDW: 13.2 % (ref 11.5–15.5)
RDW: 13.1 % (ref 11.5–15.5)
WBC: 5.2 K/uL (ref 4.0–10.5)
WBC: 5.2 K/uL (ref 4.0–10.5)

## 2024-05-19 LAB — VITAMIN B12: Vitamin B-12: 699 pg/mL (ref 211–911)

## 2024-05-19 LAB — LIPID PANEL
Cholesterol: 128 mg/dL (ref 28–200)
HDL: 41.5 mg/dL
LDL Cholesterol: 46 mg/dL (ref 10–99)
NonHDL: 86.75
Total CHOL/HDL Ratio: 3
Triglycerides: 206 mg/dL — ABNORMAL HIGH (ref 10.0–149.0)
VLDL: 41.2 mg/dL — ABNORMAL HIGH (ref 0.0–40.0)

## 2024-05-19 LAB — IBC + FERRITIN
Ferritin: 48 ng/mL (ref 22.0–322.0)
Iron: 112 ug/dL (ref 42–165)
Saturation Ratios: 25.4 % (ref 20.0–50.0)
TIBC: 441 ug/dL (ref 250.0–450.0)
Transferrin: 315 mg/dL (ref 212.0–360.0)

## 2024-05-19 LAB — BASIC METABOLIC PANEL WITH GFR
BUN: 48 mg/dL — ABNORMAL HIGH (ref 6–23)
CO2: 22 meq/L (ref 19–32)
Calcium: 10.2 mg/dL (ref 8.4–10.5)
Chloride: 104 meq/L (ref 96–112)
Creatinine, Ser: 1.84 mg/dL — ABNORMAL HIGH (ref 0.40–1.50)
GFR: 35.39 mL/min — ABNORMAL LOW
Glucose, Bld: 149 mg/dL — ABNORMAL HIGH (ref 70–99)
Potassium: 4.1 meq/L (ref 3.5–5.1)
Sodium: 139 meq/L (ref 135–145)

## 2024-05-19 LAB — HEPATIC FUNCTION PANEL
ALT: 15 U/L (ref 3–53)
AST: 24 U/L (ref 5–37)
Albumin: 4.8 g/dL (ref 3.5–5.2)
Alkaline Phosphatase: 39 U/L (ref 39–117)
Bilirubin, Direct: 0.1 mg/dL (ref 0.1–0.3)
Total Bilirubin: 0.5 mg/dL (ref 0.2–1.2)
Total Protein: 8.1 g/dL (ref 6.0–8.3)

## 2024-05-19 LAB — PSA: PSA: 0.82 ng/mL (ref 0.10–4.00)

## 2024-05-19 LAB — VITAMIN D 25 HYDROXY (VIT D DEFICIENCY, FRACTURES): VITD: 41.55 ng/mL (ref 30.00–100.00)

## 2024-05-19 LAB — HEMOGLOBIN A1C: Hgb A1c MFr Bld: 5.5 % (ref 4.6–6.5)

## 2024-05-19 LAB — TSH: TSH: 1.82 u[IU]/mL (ref 0.35–5.50)

## 2024-05-19 NOTE — Progress Notes (Signed)
 [name], Your hemoglobin is stable from September and your iron  levels remain normal.  Your mild persistent anemia is most likely related to your chronic kidney disease.  There is not appear to be any ongoing GI blood loss.  I would recommend you continue to follow your hemoglobin levels with your kidney doctor or PCP.  If there are concerns for recurrent GI bleeding, then I would recommend we pursue the video capsule endoscopy as originally discussed.

## 2024-05-26 ENCOUNTER — Ambulatory Visit (INDEPENDENT_AMBULATORY_CARE_PROVIDER_SITE_OTHER): Admitting: Internal Medicine

## 2024-05-26 ENCOUNTER — Encounter: Payer: Self-pay | Admitting: Internal Medicine

## 2024-05-26 VITALS — BP 134/70 | HR 64 | Temp 98.2°F | Ht 67.0 in | Wt 243.0 lb

## 2024-05-26 DIAGNOSIS — E1122 Type 2 diabetes mellitus with diabetic chronic kidney disease: Secondary | ICD-10-CM

## 2024-05-26 DIAGNOSIS — I10 Essential (primary) hypertension: Secondary | ICD-10-CM

## 2024-05-26 DIAGNOSIS — E7800 Pure hypercholesterolemia, unspecified: Secondary | ICD-10-CM | POA: Diagnosis not present

## 2024-05-26 DIAGNOSIS — Z0000 Encounter for general adult medical examination without abnormal findings: Secondary | ICD-10-CM

## 2024-05-26 DIAGNOSIS — D509 Iron deficiency anemia, unspecified: Secondary | ICD-10-CM

## 2024-05-26 DIAGNOSIS — N1832 Chronic kidney disease, stage 3b: Secondary | ICD-10-CM

## 2024-05-26 DIAGNOSIS — Z7984 Long term (current) use of oral hypoglycemic drugs: Secondary | ICD-10-CM

## 2024-05-26 DIAGNOSIS — Z0001 Encounter for general adult medical examination with abnormal findings: Secondary | ICD-10-CM

## 2024-05-26 MED ORDER — FERROUS GLUCONATE 324 (38 FE) MG PO TABS: 324.0000 mg | ORAL_TABLET | Freq: Every day | ORAL | 1 refills | Status: AC

## 2024-05-26 MED ORDER — ATORVASTATIN CALCIUM 40 MG PO TABS: 40.0000 mg | ORAL_TABLET | Freq: Every day | ORAL | 3 refills | Status: AC

## 2024-05-26 MED ORDER — METFORMIN HCL ER 500 MG PO TB24: ORAL_TABLET | ORAL | 3 refills | Status: AC

## 2024-05-26 MED ORDER — AMLODIPINE BESYLATE 10 MG PO TABS: 10.0000 mg | ORAL_TABLET | Freq: Every day | ORAL | 3 refills | Status: AC

## 2024-05-26 MED ORDER — DAPAGLIFLOZIN PROPANEDIOL 10 MG PO TABS: 10.0000 mg | ORAL_TABLET | Freq: Every day | ORAL | 3 refills | Status: AC

## 2024-05-26 MED ORDER — FAMOTIDINE 20 MG PO TABS: 20.0000 mg | ORAL_TABLET | Freq: Two times a day (BID) | ORAL | 3 refills | Status: AC

## 2024-05-26 MED ORDER — FUROSEMIDE 40 MG PO TABS: 40.0000 mg | ORAL_TABLET | Freq: Every day | ORAL | 3 refills | Status: AC

## 2024-05-26 MED ORDER — OLMESARTAN MEDOXOMIL 40 MG PO TABS: 40.0000 mg | ORAL_TABLET | Freq: Every day | ORAL | 0 refills | Status: AC

## 2024-05-26 NOTE — Patient Instructions (Signed)
 Ok to reduce the metformin  to 3 pills in the am  Ok to reduce the iron  pill to 1 per day  Please go to the LAB in 3 months for recheck iron  and A1c  Please continue all other medications as before, and refills have been done if requested.  Please have the pharmacy call with any other refills you may need.  Please continue your efforts at being more active, low cholesterol diet, and weight control.  You are otherwise up to date with prevention measures today.  Please keep your appointments with your specialists as you may have planned  Please make an Appointment to return in 6 months, or sooner if needed

## 2024-05-26 NOTE — Progress Notes (Unsigned)
 Patient ID: David [name], male   DOB: 1950-07-19, 74 y.o.   MRN: [id number]         Chief Complaint:: wellness exam and iron  deficiency, dm, ckd3a, hld , htn       HPI:  David [name] is a 74 y.o. male here for wellness exam; up to date               Also Pt denies chest pain, increased sob or doe, wheezing, orthopnea, PND, increased LE swelling, palpitations, dizziness or syncope.   Pt denies polydipsia, polyuria, or new focal neuro s/s.   Pt denies fever, wt loss, night sweats, loss of appetite, or other constitutional symptoms  No overt bleeding   Wt Readings from Last 3 Encounters:  05/26/24 243 lb (110.2 kg)  03/09/24 237 lb (107.5 kg)  01/28/24 240 lb (108.9 kg)   BP Readings from Last 3 Encounters:  05/26/24 134/70  03/09/24 136/80  01/28/24 128/60   Immunization History  Administered Date(s) Administered   Fluad  Quad(high Dose 65+) 12/23/2018, 02/16/2020, 03/22/2021   Fluad  Trivalent(High Dose 65+) 02/12/2023   INFLUENZA, HIGH DOSE SEASONAL PF 03/03/2018, 03/09/2024   Influenza-Unspecified 02/07/2022   PFIZER Comirnaty (Gray Top)Covid-19 Tri-Sucrose Vaccine 08/18/2020   PFIZER(Purple Top)SARS-COV-2 Vaccination 06/11/2019, 07/06/2019, 03/17/2020   Pfizer Covid-19 Vaccine Bivalent Booster 74yrs & up 04/12/2021   Pfizer(Comirnaty )Fall Seasonal Vaccine 12 years and older 02/12/2023   Pneumococcal Conjugate-13 08/18/2014   Pneumococcal Polysaccharide-23 07/19/2008, 07/21/2013, 01/13/2020   Td 07/19/2008   Tdap 12/23/2018   Zoster Recombinant(Shingrix) 03/30/2019, 05/17/2019   Zoster, Live 11/22/2011   Health Maintenance Due  Topic Date Due   Medicare Annual Wellness (AWV)  Never done   Diabetic kidney evaluation - Urine ACR  03/17/2011      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   Anticardiolipin antibody positive    Anxiety    ANXIETY 07/19/2008    CEREBROVASCULAR ACCIDENT, HX OF 07/19/2008   CVA (cerebral vascular accident) (HCC) 2003   Depression    DEPRESSION 07/19/2008   Diabetes mellitus    type II   DIABETES MELLITUS, TYPE II 01/18/2008   Diverticulosis    GERD 07/19/2008   GERD (gastroesophageal reflux disease)    Hemorrhoids    Hx of adenomatous colonic polyps    Hyperlipidemia    HYPERLIPIDEMIA 07/19/2008   Hypertension    HYPERTENSION 07/31/2009   Increased prostate specific antigen (PSA) velocity 09/13/2010   Internal hemorrhoids    Mitral regurgitation    Echo 9/21: EF 55-60, no RWMA, normal RVSF, mild-moderate LAE, mild-moderate MR, mild aortic stenosis (mean gradient 9.2 mmHg)   Perineal abscess    Peripheral vascular disease    peripheral vascular disease/carotid artery <100%   PERSONAL HX COLONIC POLYPS 12/10/2007   Unspecified Peripheral Vascular Disease 01/18/2008   Varicose veins    Past Surgical History:  Procedure Laterality Date   7 abdominal hernia repairs  05/31/10   ABDOMINAL AORTAGRAM N/A 01/12/2013   Procedure: ABDOMINAL EZELLA;  Surgeon: Gaile LELON New, MD;  Location: [facility] CATH LAB;  Service: Cardiovascular;  Laterality: N/A;   ABDOMINAL AORTIC ANEURYSM REPAIR     ABDOMINAL AORTOGRAM W/LOWER EXTREMITY Bilateral 10/05/2019   Procedure: ABDOMINAL AORTOGRAM W/LOWER EXTREMITY;  Surgeon: New Gaile LELON, MD;  Location: [facility] INVASIVE CV LAB;  Service: Cardiovascular;  Laterality: Bilateral;   COLONOSCOPY     ESOPHAGOGASTRODUODENOSCOPY  N/A 11/26/2023   Procedure: EGD (ESOPHAGOGASTRODUODENOSCOPY);  Surgeon: Stacia Glendia BRAVO, MD;  Location: [facility];  Service: Gastroenterology;  Laterality: N/A;   hernia surgury     74 years old   perineal abscess     PERIPHERAL VASCULAR CATHETERIZATION N/A 11/23/2014   Procedure: Abdominal Aortogram;  Surgeon: Gaile LELON New, MD;  Location: [facility]VASIVE CV LAB;  Service: Cardiovascular;  Laterality: N/A;   PERIPHERAL VASCULAR CATHETERIZATION  11/23/2014   Procedure: Lower  Extremity Angiography;  Surgeon: Gaile LELON New, MD;  Location: [facility]VASIVE CV LAB;  Service: Cardiovascular;;   PERIPHERAL VASCULAR CATHETERIZATION  11/23/2014   Procedure: Peripheral Vascular Intervention;  Surgeon: Gaile LELON New, MD;  Location: [facility] INVASIVE CV LAB;  Service: Cardiovascular;;  PTA bilateral common iliac PTA/DCB left fem-pop   POLYPECTOMY     s/p right and left popliteal aneurysm repair     SP ENDO REPAIR INFRAREN AAA      reports that he quit smoking about 32 years ago. His smoking use included cigarettes. He has never used smokeless tobacco. He reports current alcohol use of about 2.0 standard drinks of alcohol per week. He reports that he does not use drugs. family history includes Aortic aneurysm in his mother; Cancer in his brother. Allergies[1] Medications Ordered Prior to Encounter[2]      ROS:  All others reviewed and negative.  Objective        PE:  BP 134/70 (BP Location: Right Arm, Patient Position: Sitting, Cuff Size: Normal)   Pulse 64   Temp 98.2 F (36.8 C) (Oral)   Ht 5' 7 (1.702 m)   Wt 243 lb (110.2 kg)   SpO2 98%   BMI 38.06 kg/m                 Constitutional: Pt appears in NAD               HENT: Head: NCAT.                Right Ear: External ear normal.                 Left Ear: External ear normal.                Eyes: . Pupils are equal, round, and reactive to light. Conjunctivae and EOM are normal               Nose: without d/c or deformity               Neck: Neck supple. Gross normal ROM               Cardiovascular: Normal rate and regular rhythm.                 Pulmonary/Chest: Effort normal and breath sounds without rales or wheezing.                Abd:  Soft, NT, ND, + BS, no organomegaly               Neurological: Pt is alert. At baseline orientation, motor grossly intact               Skin: Skin is warm. No rashes, no other new lesions, LE edema - none               Psychiatric: Pt behavior is normal without agitation    Micro: none  Cardiac tracings I have personally interpreted today:  none  Pertinent Radiological findings (summarize): none   Lab Results  Component Value Date   WBC 5.2 05/19/2024   WBC 5.2 05/19/2024   HGB 11.3 (L) 05/19/2024   HGB 11.5 (L) 05/19/2024   HCT 33.1 (L) 05/19/2024   HCT 32.8 (L) 05/19/2024   PLT 202.0 05/19/2024   PLT 204.0 05/19/2024   GLUCOSE 149 (H) 05/19/2024   CHOL 128 05/19/2024   TRIG 206.0 (H) 05/19/2024   HDL 41.50 05/19/2024   LDLDIRECT 64.0 05/24/2022   LDLCALC 46 05/19/2024   ALT 15 05/19/2024   AST 24 05/19/2024   NA 139 05/19/2024   K 4.1 05/19/2024   CL 104 05/19/2024   CREATININE 1.84 (H) 05/19/2024   BUN 48 (H) 05/19/2024   CO2 22 05/19/2024   TSH 1.82 05/19/2024   PSA 0.82 05/19/2024   INR 1.3 (H) 11/24/2023   HGBA1C 5.5 05/19/2024   MICROALBUR 3.9 (H) 03/16/2010   Assessment/Plan:  David [name] is a 74 y.o. 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09/07/2008), Anticardiolipin antibody positive, Anxiety, ANXIETY (07/19/2008), CEREBROVASCULAR ACCIDENT, HX OF (07/19/2008), CVA (cerebral vascular accident) (HCC) (2003), Depression, DEPRESSION (07/19/2008), Diabetes mellitus, DIABETES MELLITUS, TYPE II (01/18/2008), Diverticulosis, GERD (07/19/2008), GERD (gastroesophageal reflux disease), Hemorrhoids, adenomatous colonic polyps, Hyperlipidemia, HYPERLIPIDEMIA (07/19/2008), Hypertension, HYPERTENSION (07/31/2009), Increased prostate specific antigen (PSA) velocity (09/13/2010), Internal hemorrhoids, Mitral regurgitation, Perineal abscess, Peripheral vascular disease, PERSONAL HX COLONIC POLYPS (12/10/2007), Unspecified Peripheral Vascular Disease (01/18/2008), and Varicose veins.  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 I have personally reviewed and have noted: 1) the patient's medical and social history 2) The patient's current medications and supplements 3) The patient's height, weight, and BMI have been recorded in the chart  Followup: Return in about 6 months (around 11/23/2024).  Lynwood Rush, MD 05/27/2024 9:20 PM [facility]     [1]  Allergies Allergen Reactions   Latex Rash  [2]  Current Outpatient Medications on File Prior to Visit  Medication Sig Dispense Refill   apixaban  (ELIQUIS ) 5 MG TABS tablet TAKE 1 TABLET TWICE A DAY 180 tablet 1   Ascorbic Acid (VITAMIN C) 1000 MG tablet Take 1,000 mg by mouth daily.     aspirin EC 81 MG tablet Take 81 mg by mouth daily. Swallow whole.  cetirizine  (ZYRTEC ) 10 MG tablet Take 1 tablet (10 mg total) by mouth daily. 90 tablet 3   Cholecalciferol (VITAMIN D ) 50 MCG (2000 UT) tablet Take 2,000 Units by mouth in the morning and at bedtime.     docusate sodium   (COLACE) 100 MG capsule Take 100 mg by mouth 2 (two) times daily.     folic acid  (FOLVITE ) 800 MCG tablet Take 800 mcg by mouth daily.     gemfibrozil  (LOPID ) 600 MG tablet TAKE TWO TABLETS BY MOUTH ONCE DAILY 180 tablet 2   Omega-3 Fatty Acids (FISH OIL) 1000 MG CAPS Take 1,000 mg by mouth daily.     psyllium (FIBER LAXATIVE) 0.52 g capsule Take 3.36 g by mouth daily with lunch.     triamcinolone  (NASACORT ) 55 MCG/ACT AERO nasal inhaler Place 2 sprays into the nose daily. (Patient taking differently: Place 1 spray into the nose daily as needed (allergies).) 1 Inhaler 12   No current facility-administered medications on file prior to visit.

## 2024-05-27 ENCOUNTER — Encounter: Payer: Self-pay | Admitting: Internal Medicine

## 2024-05-27 NOTE — Assessment & Plan Note (Signed)
 Lab Results  Component Value Date   LDLCALC 46 05/19/2024   Stable, pt to continue current statin lipitor 40 mg qd

## 2024-05-27 NOTE — Assessment & Plan Note (Signed)
 BP Readings from Last 3 Encounters:  05/26/24 134/70  03/09/24 136/80  01/28/24 128/60   Stable, pt to continue medical treatment norvasc  10 every day, benicar  40 qd

## 2024-05-27 NOTE — Assessment & Plan Note (Signed)
 With ckd3b Lab Results  Component Value Date   HGBA1C 5.5 05/19/2024   Over controlled pt to continue current medical treatment farxiga  10 every day, but ok for decreased metformin  ER to 3 qd

## 2024-05-27 NOTE — Assessment & Plan Note (Signed)
 Iron  level is normal, ok for reduced iron  supplement to 1 every day, recheck lab in 3 months

## 2024-05-27 NOTE — Assessment & Plan Note (Signed)

## 2024-06-02 ENCOUNTER — Ambulatory Visit: Admitting: Podiatry

## 2024-06-02 ENCOUNTER — Encounter: Payer: Self-pay | Admitting: Podiatry

## 2024-06-02 DIAGNOSIS — E1121 Type 2 diabetes mellitus with diabetic nephropathy: Secondary | ICD-10-CM | POA: Diagnosis not present

## 2024-06-02 DIAGNOSIS — M79674 Pain in right toe(s): Secondary | ICD-10-CM

## 2024-06-02 DIAGNOSIS — M79675 Pain in left toe(s): Secondary | ICD-10-CM | POA: Diagnosis not present

## 2024-06-02 DIAGNOSIS — B351 Tinea unguium: Secondary | ICD-10-CM | POA: Diagnosis not present

## 2024-06-02 DIAGNOSIS — L84 Corns and callosities: Secondary | ICD-10-CM

## 2024-06-02 NOTE — Progress Notes (Signed)
 "  Subjective:  Patient ID: David [name], male    DOB: 1950-05-22,   MRN: [id number]  Chief Complaint  Patient presents with   Diabetes    Cut the toenails.  I have a corn or two that she needs to work on. Dr. Lynwood Hopper - 05/26/2024; A1c - 5.5    74 y.o. male presents for concern of thickened elongated and painful nails that are difficult to trim. Requesting to have them trimmed today. Relates burning and tingling in their feet. Patient is diabetic and last A1c was  Lab Results  Component Value Date   HGBA1C 5.5 05/19/2024   .   PCP:  Norleen Lynwood ORN, MD    . Denies any other pedal complaints. Denies n/v/f/c.   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   Anticardiolipin antibody positive    Anxiety    ANXIETY 07/19/2008   CEREBROVASCULAR ACCIDENT, HX OF 07/19/2008   CVA (cerebral vascular accident) (HCC) 2003   Depression    DEPRESSION 07/19/2008   Diabetes mellitus    type II   DIABETES MELLITUS, TYPE II 01/18/2008   Diverticulosis    GERD 07/19/2008   GERD (gastroesophageal reflux disease)    Hemorrhoids    Hx of adenomatous colonic polyps    Hyperlipidemia    HYPERLIPIDEMIA 07/19/2008   Hypertension    HYPERTENSION 07/31/2009   Increased prostate specific antigen (PSA) velocity 09/13/2010   Internal hemorrhoids    Mitral regurgitation    Echo 9/21: EF 55-60, no RWMA, normal RVSF, mild-moderate LAE, mild-moderate MR, mild aortic stenosis (mean gradient 9.2 mmHg)   Perineal abscess    Peripheral vascular disease    peripheral vascular disease/carotid artery <100%   PERSONAL HX COLONIC POLYPS 12/10/2007   Unspecified Peripheral Vascular Disease 01/18/2008   Varicose veins     Objective:  Physical Exam: Vascular: DP/PT pulses 2/4 bilateral. CFT <3 seconds. Absent hair growth on digits. Edema noted to bilateral lower extremities. Xerosis noted bilaterally.   Skin. No lacerations or abrasions bilateral feet. Nails 1-5 bilateral  are thickened discolored and elongated with subungual debris. Hyperkeratotic lesion noted sub fifth metatarsal base on left and sub first metatarsal head on right  Musculoskeletal: MMT 5/5 bilateral lower extremities in DF, PF, Inversion and Eversion. Deceased ROM in DF of ankle joint.  Neurological: Sensation intact to light touch. Protective sensation diminished bilateral.     Assessment:   1. Pain due to onychomycosis of toenails of both feet   2. Type 2 diabetes mellitus with diabetic nephropathy, without long-term current use of insulin  (HCC)   3. Callus       Plan:  Patient was evaluated and treated and all questions answered. -Discussed and educated patient on diabetic foot care, especially with  regards to the vascular, neurological and musculoskeletal systems.  -Stressed the importance of good glycemic control and the detriment of not  controlling glucose levels in relation to the foot. -Discussed supportive shoes at all times and checking feet regularly.  -Mechanically debrided all nails 1-5 bilateral using sterile nail nipper and filed with dremel without incident  -Hyperkeratotic tissue debrided without incident with chisel x 2 as courtesy and discussed prevention techniques in future.  -Answered all patient questions -Patient to return  in 4 months for at risk foot care -Patient advised to call the office if any problems or questions arise  in the meantime.   Asberry Failing, DPM    "

## 2024-09-29 ENCOUNTER — Ambulatory Visit: Admitting: Podiatry
